# Patient Record
Sex: Male | Born: 1950 | Race: Black or African American | Hispanic: No | Marital: Married | Smoking: Never smoker
Health system: Southern US, Community
[De-identification: ages and names within clinical notes are randomized; demographics above are authoritative.]

## PROBLEM LIST (undated history)

## (undated) DIAGNOSIS — M419 Scoliosis, unspecified: Secondary | ICD-10-CM

## (undated) DIAGNOSIS — J449 Chronic obstructive pulmonary disease, unspecified: Secondary | ICD-10-CM

## (undated) DIAGNOSIS — I10 Essential (primary) hypertension: Secondary | ICD-10-CM

## (undated) DIAGNOSIS — J45909 Unspecified asthma, uncomplicated: Secondary | ICD-10-CM

## (undated) DIAGNOSIS — J302 Other seasonal allergic rhinitis: Secondary | ICD-10-CM

## (undated) DIAGNOSIS — I219 Acute myocardial infarction, unspecified: Secondary | ICD-10-CM

## (undated) DIAGNOSIS — E7800 Pure hypercholesterolemia, unspecified: Secondary | ICD-10-CM

## (undated) DIAGNOSIS — G4730 Sleep apnea, unspecified: Secondary | ICD-10-CM

## (undated) HISTORY — PX: CARPAL TUNNEL RELEASE: SHX101

## (undated) HISTORY — DX: Sleep apnea, unspecified: G47.30

## (undated) HISTORY — PX: OTHER SURGICAL HISTORY: SHX169

## (undated) HISTORY — DX: Other seasonal allergic rhinitis: J30.2

## (undated) HISTORY — DX: Unspecified asthma, uncomplicated: J45.909

## (undated) HISTORY — DX: Essential (primary) hypertension: I10

## (undated) HISTORY — DX: Scoliosis, unspecified: M41.9

## (undated) HISTORY — DX: Acute myocardial infarction, unspecified: I21.9

## (undated) HISTORY — DX: Pure hypercholesterolemia, unspecified: E78.00

---

## 2003-01-09 ENCOUNTER — Encounter: Payer: Self-pay | Admitting: Neurosurgery

## 2003-01-11 ENCOUNTER — Encounter: Payer: Self-pay | Admitting: Neurosurgery

## 2003-01-11 ENCOUNTER — Inpatient Hospital Stay (HOSPITAL_COMMUNITY): Admission: RE | Admit: 2003-01-11 | Discharge: 2003-01-12 | Payer: Self-pay | Admitting: Neurosurgery

## 2003-02-21 ENCOUNTER — Encounter: Admission: RE | Admit: 2003-02-21 | Discharge: 2003-02-21 | Payer: Self-pay | Admitting: Neurosurgery

## 2003-02-21 ENCOUNTER — Encounter: Payer: Self-pay | Admitting: Neurosurgery

## 2004-03-04 ENCOUNTER — Other Ambulatory Visit: Payer: Self-pay

## 2004-09-12 ENCOUNTER — Emergency Department: Payer: Self-pay | Admitting: Emergency Medicine

## 2004-10-14 ENCOUNTER — Emergency Department: Payer: Self-pay | Admitting: Emergency Medicine

## 2004-12-23 ENCOUNTER — Emergency Department: Payer: Self-pay | Admitting: Emergency Medicine

## 2005-04-17 ENCOUNTER — Emergency Department: Payer: Self-pay | Admitting: Emergency Medicine

## 2005-08-30 ENCOUNTER — Other Ambulatory Visit: Payer: Self-pay

## 2005-08-30 ENCOUNTER — Emergency Department: Payer: Self-pay | Admitting: Emergency Medicine

## 2005-10-19 ENCOUNTER — Ambulatory Visit: Payer: Self-pay | Admitting: Internal Medicine

## 2006-03-06 ENCOUNTER — Emergency Department: Payer: Self-pay | Admitting: Internal Medicine

## 2006-06-04 ENCOUNTER — Other Ambulatory Visit: Payer: Self-pay

## 2006-06-04 ENCOUNTER — Emergency Department: Payer: Self-pay | Admitting: Unknown Physician Specialty

## 2007-10-02 ENCOUNTER — Emergency Department: Payer: Self-pay | Admitting: Emergency Medicine

## 2008-05-06 ENCOUNTER — Emergency Department: Payer: Self-pay | Admitting: Emergency Medicine

## 2008-07-18 ENCOUNTER — Emergency Department: Payer: Self-pay

## 2010-08-13 ENCOUNTER — Emergency Department: Payer: Self-pay | Admitting: Emergency Medicine

## 2011-04-21 ENCOUNTER — Inpatient Hospital Stay: Payer: Self-pay | Admitting: Internal Medicine

## 2011-10-10 ENCOUNTER — Emergency Department: Payer: Self-pay | Admitting: *Deleted

## 2013-11-18 ENCOUNTER — Emergency Department: Payer: Self-pay | Admitting: Emergency Medicine

## 2013-11-18 LAB — TROPONIN I

## 2013-11-18 LAB — CBC WITH DIFFERENTIAL/PLATELET
BASOS ABS: 0 10*3/uL (ref 0.0–0.1)
Basophil %: 0.7 %
EOS ABS: 0.2 10*3/uL (ref 0.0–0.7)
Eosinophil %: 3.1 %
HCT: 51.1 % (ref 40.0–52.0)
HGB: 16.4 g/dL (ref 13.0–18.0)
LYMPHS ABS: 1.8 10*3/uL (ref 1.0–3.6)
Lymphocyte %: 29.7 %
MCH: 28.2 pg (ref 26.0–34.0)
MCHC: 32.2 g/dL (ref 32.0–36.0)
MCV: 88 fL (ref 80–100)
Monocyte #: 0.5 x10 3/mm (ref 0.2–1.0)
Monocyte %: 8.3 %
Neutrophil #: 3.6 10*3/uL (ref 1.4–6.5)
Neutrophil %: 58.2 %
Platelet: 171 10*3/uL (ref 150–440)
RBC: 5.84 10*6/uL (ref 4.40–5.90)
RDW: 16.3 % — ABNORMAL HIGH (ref 11.5–14.5)
WBC: 6.1 10*3/uL (ref 3.8–10.6)

## 2013-11-18 LAB — BASIC METABOLIC PANEL
ANION GAP: 2 — AB (ref 7–16)
BUN: 18 mg/dL (ref 7–18)
CALCIUM: 9.1 mg/dL (ref 8.5–10.1)
CO2: 33 mmol/L — AB (ref 21–32)
CREATININE: 1.08 mg/dL (ref 0.60–1.30)
Chloride: 101 mmol/L (ref 98–107)
EGFR (Non-African Amer.): >60
GLUCOSE: 98 mg/dL (ref 65–99)
OSMOLALITY: 274 (ref 275–301)
Potassium: 3.9 mmol/L (ref 3.5–5.1)
SODIUM: 136 mmol/L (ref 136–145)

## 2013-11-21 ENCOUNTER — Ambulatory Visit (INDEPENDENT_AMBULATORY_CARE_PROVIDER_SITE_OTHER): Payer: Medicare Other | Admitting: Pulmonary Disease

## 2013-11-21 ENCOUNTER — Encounter: Payer: Self-pay | Admitting: Pulmonary Disease

## 2013-11-21 ENCOUNTER — Encounter (INDEPENDENT_AMBULATORY_CARE_PROVIDER_SITE_OTHER): Payer: Self-pay

## 2013-11-21 VITALS — BP 148/86 | HR 66 | Temp 97.7°F | Ht 66.0 in | Wt 345.0 lb

## 2013-11-21 DIAGNOSIS — G4733 Obstructive sleep apnea (adult) (pediatric): Secondary | ICD-10-CM | POA: Insufficient documentation

## 2013-11-21 DIAGNOSIS — J449 Chronic obstructive pulmonary disease, unspecified: Secondary | ICD-10-CM

## 2013-11-21 DIAGNOSIS — I509 Heart failure, unspecified: Secondary | ICD-10-CM

## 2013-11-21 DIAGNOSIS — R0602 Shortness of breath: Secondary | ICD-10-CM | POA: Insufficient documentation

## 2013-11-21 DIAGNOSIS — I25810 Atherosclerosis of coronary artery bypass graft(s) without angina pectoris: Secondary | ICD-10-CM

## 2013-11-21 DIAGNOSIS — Z8709 Personal history of other diseases of the respiratory system: Secondary | ICD-10-CM | POA: Insufficient documentation

## 2013-11-21 DIAGNOSIS — Z9989 Dependence on other enabling machines and devices: Secondary | ICD-10-CM

## 2013-11-21 NOTE — Progress Notes (Signed)
Subjective:    Patient ID: [name], male    DOB: 03-02-1951, 63 y.o.   MRN: [id number]  HPI  [name] was referred by Dr. Dario Guardian for shortness of breath. He has been having shortness of breath with exertion.  He has to stop to catch his breath after just walking a few.  He doesn't have wheeze or cough.  This has been getting worse over the last two years. It has definitely gotten worse in the last year.  He has been told that he has fluid in his lungs. He has congestive heart failure. He follows with Dr. Juel Burrow who is a cardiologist.   He had his first heart attack in 1994 and he was airlifted to [facility]. He had a heart catheterization at [facility] but no PCI.  A similar heart attack happened in the 1990's.  Again, he thinks that he didn't have a stent for that one either.    He does not have chest pain but he does have leg swelling.  He does not adjust his lasix based on weight or swelling.  He knows that his dyspnea worsens on days when he has forgotten his lasix. He is not following a low sodium diet.    He has been inhaled medications for a few years. He takes Spiriva, Advair, combivent.  He had a lung function test at some point a few years ago and he was told that COPD and asthma.  He smoked cigarettes very infrequently for only a month or a month and a half when he was in his 46s.  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  He worked with [employer], mostly loading and unloading.    He doesn't note significant second hand smoke.  Childhood was normal without respiratory problems.  He has pollen allergies that causes sinus congestion.    He ended up in the ER on Sunday for shoulder pain and was told then that he had fluid on his lungs.  He has a bad hip and he knows that he needs a replacement.  He is nervous about having surgery so he has ben putting it off.  He also has a cervical stenosis.  For five years he has been very immobile, just using a cain and  a walker.  He cannot exercise due to these problems.    Past Medical History  Diagnosis Date  . High blood pressure   . Heart attack   . Asthma   . High cholesterol   . Seasonal allergies   . Sleep apnea   . Scoliosis      Family History  Problem Relation Age of Onset  . Heart disease Father      History   Social History  . Marital Status: Married    Spouse Name: N/A    Number of Children: N/A  . Years of Education: N/A   Occupational History  . Not on file.   Social History Main Topics  . Smoking status: Never Smoker   . Smokeless tobacco: Never Used  . Alcohol Use: No  . Drug Use: No  . Sexual Activity: Not on file   Other Topics Concern  . Not on file   Social History Narrative  . No narrative on file     Allergies  Allergen Reactions  . Metformin And Related     "shakes" per pt  . Morphine And Related     Hallucinations, sweats  No outpatient prescriptions prior to visit.   No facility-administered medications prior to visit.      Review of Systems  Constitutional: Negative for fever and unexpected weight change.  HENT: Positive for sinus pressure. Negative for congestion, dental problem, ear pain, nosebleeds, postnasal drip, rhinorrhea, sneezing, sore throat and trouble swallowing.   Eyes: Negative for redness and itching.  Respiratory: Positive for shortness of breath. Negative for cough, chest tightness and wheezing.   Cardiovascular: Positive for leg swelling. Negative for palpitations.  Gastrointestinal: Positive for vomiting. Negative for nausea.  Genitourinary: Negative for dysuria.  Musculoskeletal: Negative for joint swelling.  Skin: Negative for rash.  Neurological: Negative for headaches.  Hematological: Does not bruise/bleed easily.  Psychiatric/Behavioral: Negative for dysphoric mood. The patient is not nervous/anxious.        Objective:   Physical Exam  Filed Vitals:   11/21/13 1029  BP: 148/86  Pulse: 66  Temp:  97.7 F (36.5 C)  TempSrc: Oral  Height: 5\' 6"  (1.676 m)  Weight: 345 lb (156.491 kg)  SpO2: 92%  RA  Gen: morbidly obese, chronically ill appearing, in wheelchair HEENT: NCAT, PERRL, EOMi, OP clear, neck supple without masses PULM: crackles 1/2 way up bilaterally CV: RRR, systolic murmur LUSB, no JVD AB: BS+, soft, nontender, no hsm Ext: warm, massive, chronic pitting edema in both legs, no clubbing, no cyanosis Derm: no rash or skin breakdown Neuro: A&Ox4, CN II-XII intact, strength 5/5 in all 4 extremities  2012 CT chest reviewed> pulm edema, ? Mild tracheomalacia bronchus intermedius, no clear parenchymal problems, no PE      Assessment & Plan:   Shortness of breath [name] is clearly volume overloaded on exam.  I explained to him today that there are many causes of his dyspnea, and the most significant are heart failure and morbid obesity with deconditioning.  I reviewed the images from a 2012 CT chest with him and his son today in clinic.  He clearly has restrictive lung disease due to a large heart and morbid obesity.  He had pulmonary edema on that study as well.  I question mild tracheomalacia, but this wasn't perfectly clear.  So in summary, I doubt there is significant lung pathology going on here.  He may have mild asthma, but because his history is not consistent with wheezing, chest tightness, or cough, I don't think that is playing a big role here.  Plan: -full PFT -obtain records from PCP and cardiologist > clinic notes, lab work, echo -I have asked him to discuss weight monitoring, more aggressive diuretics, and sodium intake monitoring with his Cardiologist -once I can see the records of his echo and labs I can recommend some changes to his diuretic regimen if he can't get into cardiology first -for now continue all inhaled therapies, but I suspect we will be able to discontinue some or all of these after PFTs -continue CPAP, see below -bring walker or cane for  next visit so we can check ambulatory O2 saturation  Plan:  OSA on CPAP It has been several years since he has had a CPAP titration study and his weight has gone up significantly in the interim.  I worry about the possibility of central sleep apnea as well given his CHF  Plan: -split night study  CHF (congestive heart failure) He is clearly volume overloaded and doesn't follow a low sodium diet. We need records to help adjust meds and he needs to discuss a more aggressive diuretic/fluid management strategy with  his cardiologist.  Plan: -obtain cardiology records and echo -low sodium diet encouraged  CAD (coronary artery disease) of artery bypass graft Again, we need records here    Updated Medication List Outpatient Encounter Prescriptions as of 11/21/2013  Medication Sig  . aspirin 81 MG tablet Take 81 mg by mouth daily.  . celecoxib (CELEBREX) 200 MG capsule Take 200 mg by mouth daily.  . Fluticasone-Salmeterol (ADVAIR) 250-50 MCG/DOSE AEPB Inhale 1 puff into the lungs 2 (two) times daily.  . furosemide (LASIX) 40 MG tablet Take 40 mg by mouth daily.  Marland Kitchen glipiZIDE (GLUCOTROL) 5 MG tablet Take 5 mg by mouth daily before breakfast.  . Ipratropium-Albuterol (COMBIVENT RESPIMAT) 20-100 MCG/ACT AERS respimat Inhale 1 puff into the lungs every 6 (six) hours.  . nebivolol (BYSTOLIC) 10 MG tablet Take 10 mg by mouth daily.  . rosuvastatin (CRESTOR) 10 MG tablet Take 10 mg by mouth daily.  . tamsulosin (FLOMAX) 0.4 MG CAPS capsule Take 0.4 mg by mouth daily.  Marland Kitchen telmisartan (MICARDIS) 80 MG tablet Take 80 mg by mouth daily.  Marland Kitchen tiotropium (SPIRIVA) 18 MCG inhalation capsule Place 18 mcg into inhaler and inhale daily.

## 2013-11-21 NOTE — Assessment & Plan Note (Signed)
It has been several years since he has had a CPAP titration study and his weight has gone up significantly in the interim.  I worry about the possibility of central sleep apnea as well given his CHF  Plan: -split night study

## 2013-11-21 NOTE — Assessment & Plan Note (Signed)
Again, we need records here

## 2013-11-21 NOTE — Patient Instructions (Signed)
We will set up lung function testing at [facility] We will request records from Dr. Aurelio BrashJadali's office and Dr. Renie OraMassoud's office Next time bring your walker or cane so we can check your oxygen when you walk We will see you back in 2-4 weeks or sooner if needed

## 2013-11-21 NOTE — Assessment & Plan Note (Addendum)
[name] is clearly volume overloaded on exam.  I explained to him today that there are many causes of his dyspnea, and the most significant are heart failure and morbid obesity with deconditioning.  I reviewed the images from a 2012 CT chest with him and his son today in clinic.  He clearly has restrictive lung disease due to a large heart and morbid obesity.  He had pulmonary edema on that study as well.  I question mild tracheomalacia, but this wasn't perfectly clear.  So in summary, I doubt there is significant lung pathology going on here.  He may have mild asthma, but because his history is not consistent with wheezing, chest tightness, or cough, I don't think that is playing a big role here.  Plan: -full PFT -obtain records from PCP and cardiologist > clinic notes, lab work, echo -I have asked him to discuss weight monitoring, more aggressive diuretics, and sodium intake monitoring with his Cardiologist -once I can see the records of his echo and labs I can recommend some changes to his diuretic regimen if he can't get into cardiology first -for now continue all inhaled therapies, but I suspect we will be able to discontinue some or all of these after PFTs -continue CPAP, see below -bring walker or cane for next visit so we can check ambulatory O2 saturation  Plan:

## 2013-11-21 NOTE — Assessment & Plan Note (Signed)
He is clearly volume overloaded and doesn't follow a low sodium diet. We need records to help adjust meds and he needs to discuss a more aggressive diuretic/fluid management strategy with his cardiologist.  Plan: -obtain cardiology records and echo -low sodium diet encouraged

## 2013-12-06 ENCOUNTER — Ambulatory Visit: Payer: Self-pay | Admitting: Pulmonary Disease

## 2013-12-06 LAB — PULMONARY FUNCTION TEST

## 2013-12-12 ENCOUNTER — Ambulatory Visit (INDEPENDENT_AMBULATORY_CARE_PROVIDER_SITE_OTHER): Payer: Medicare Other | Admitting: Pulmonary Disease

## 2013-12-12 ENCOUNTER — Encounter: Payer: Self-pay | Admitting: Pulmonary Disease

## 2013-12-12 VITALS — BP 148/92 | HR 87 | Ht 67.0 in | Wt 355.0 lb

## 2013-12-12 DIAGNOSIS — G4733 Obstructive sleep apnea (adult) (pediatric): Secondary | ICD-10-CM

## 2013-12-12 DIAGNOSIS — R0602 Shortness of breath: Secondary | ICD-10-CM

## 2013-12-12 DIAGNOSIS — J9611 Chronic respiratory failure with hypoxia: Secondary | ICD-10-CM

## 2013-12-12 DIAGNOSIS — Z9989 Dependence on other enabling machines and devices: Secondary | ICD-10-CM

## 2013-12-12 DIAGNOSIS — J9610 Chronic respiratory failure, unspecified whether with hypoxia or hypercapnia: Secondary | ICD-10-CM

## 2013-12-12 DIAGNOSIS — R0902 Hypoxemia: Secondary | ICD-10-CM

## 2013-12-12 NOTE — Assessment & Plan Note (Signed)
Is been several years since he has had a CPAP titration study. 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  Plan: -CPAP titration study

## 2013-12-12 NOTE — Assessment & Plan Note (Signed)
He has chronic hypoxemic respiratory failure do to his severe obesity limiting his ventilation as well as his CHF.  Plan: -2 L of oxygen with exertion and each bedtime

## 2013-12-12 NOTE — Patient Instructions (Signed)
Use 2 L of Oxygen with exertion and with sleep Stop Spiriva, Stop Adviar Use the combivent as needed for shortness of breath Follow up with your cardiologist We will see you back in 6 months or sooner if needed

## 2013-12-12 NOTE — Progress Notes (Signed)
Subjective:    Patient ID: [name], male    DOB: [birth year], 63 y.o.   MRN: [id number]  Synopsis:: This is a morbidly obese 63 year old who came to the [facility] pulmonary clinic in January of 2014 for evaluation of shortness of breath. A prior history of asthma. He also has CHF as well as obstructive sleep apnea. Full pulmonary function testing showed severe restriction consistent with obesity but no airflow obstruction. A CT scan of his chest showed no evidence of parenchymal lung disease.  HPI  12/12/2013 ROV > He continues to have shortness of breath on exertion.  He continues to use his CPAP machine at night as well as at times during the day while rest.  He lost the long tubing for his CPAP machine, but apparently he is still able to use it.  He really struggles to breathe get around the house. He does not have much problem with cough or wheezing. He does not get benefit from the Advair and Spiriva which he has been using continuously.  Past Medical History  Diagnosis Date  . High blood pressure   . Heart attack   . Asthma   . High cholesterol   . Seasonal allergies   . Sleep apnea   . Scoliosis      Review of Systems  Constitutional: Positive for fatigue. Negative for fever and chills.  HENT: Negative for postnasal drip, rhinorrhea and sinus pressure.   Respiratory: Positive for shortness of breath. Negative for cough and wheezing.   Cardiovascular: Positive for leg swelling. Negative for chest pain and palpitations.       Objective:   Physical Exam  Filed Vitals:   12/12/13 1037  BP: 148/92  Pulse: 87  Height: 5\' 7"  (1.702 m)  Weight: 355 lb (161.027 kg)  SpO2: 90%  RA  Ambulated on RA and dropped to 86% in a few feet, improved with 2LNC  Gen: morbidly obese, no acute distress HEENT: NCAT, PERRL, EOMi, OP clear, neck supple without masses PULM: CTA B CV: RRR, no mgr, no JVD AB: BS+, soft, nontender, no hsm Ext: warm, massive edema, no  clubbing, no cyanosis        Assessment & Plan:   Shortness of breath Pulmonary function testing showed no airflow obstruction but severe restriction from obesity.  I explained to him today that he is short of breath primarily because of his severe, morbid obesity, deconditioning, and heart failure.  He may have mild intermittent asthma but it was not severe enough to show up on pulmonary function testing.  Plan: -Stop Spiriva -Stop Advair -Continue when necessary Combivent for mild intermittent asthma Followup with cardiologist regarding diuretic regimen -CPAP each bedtime, see conversation below  OSA on CPAP Is been several years since he has had a CPAP titration study. 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  Plan: -CPAP titration study  Chronic hypoxemic respiratory failure He has chronic hypoxemic respiratory failure do to his severe obesity limiting his ventilation as well as his CHF.  Plan: -2 L of oxygen with exertion and each bedtime    Updated Medication List Outpatient Encounter Prescriptions as of 12/12/2013  Medication Sig  . aspirin 81 MG tablet Take 81 mg by mouth daily.  . celecoxib (CELEBREX) 200 MG capsule Take 200 mg by mouth daily.  . clonazePAM (KLONOPIN) 1 MG tablet Take 1 mg by mouth 3 (three) times daily as needed  for anxiety.  . Fluticasone-Salmeterol (ADVAIR) 250-50 MCG/DOSE AEPB Inhale 1 puff into the lungs 2 (two) times daily.  . furosemide (LASIX) 40 MG tablet Take 40 mg by mouth daily.  Marland Kitchen. glipiZIDE (GLUCOTROL) 5 MG tablet Take 5 mg by mouth daily before breakfast.  . Ipratropium-Albuterol (COMBIVENT RESPIMAT) 20-100 MCG/ACT AERS respimat Inhale 1 puff into the lungs every 6 (six) hours.  . nebivolol (BYSTOLIC) 10 MG tablet Take 10 mg by mouth daily.  . rosuvastatin (CRESTOR) 10 MG tablet Take 10 mg by mouth daily.  . tamsulosin (FLOMAX) 0.4 MG CAPS capsule Take 0.4 mg by mouth daily.  Marland Kitchen.  telmisartan (MICARDIS) 80 MG tablet Take 80 mg by mouth daily.  Marland Kitchen. tiotropium (SPIRIVA) 18 MCG inhalation capsule Place 18 mcg into inhaler and inhale daily.

## 2013-12-12 NOTE — Assessment & Plan Note (Signed)
Pulmonary function testing showed no airflow obstruction but severe restriction from obesity.  I explained to him today that he is short of breath primarily because of his severe, morbid obesity, deconditioning, and heart failure.  He may have mild intermittent asthma but it was not severe enough to show up on pulmonary function testing.  Plan: -Stop Spiriva -Stop Advair -Continue when necessary Combivent for mild intermittent asthma Followup with cardiologist regarding diuretic regimen -CPAP each bedtime, see conversation below

## 2013-12-16 ENCOUNTER — Encounter (HOSPITAL_BASED_OUTPATIENT_CLINIC_OR_DEPARTMENT_OTHER): Payer: Medicare Other

## 2013-12-27 ENCOUNTER — Encounter: Payer: Self-pay | Admitting: Pulmonary Disease

## 2014-02-05 ENCOUNTER — Encounter (INDEPENDENT_AMBULATORY_CARE_PROVIDER_SITE_OTHER): Payer: Self-pay

## 2014-02-05 ENCOUNTER — Encounter: Payer: Self-pay | Admitting: Pulmonary Disease

## 2014-02-05 ENCOUNTER — Ambulatory Visit (INDEPENDENT_AMBULATORY_CARE_PROVIDER_SITE_OTHER): Payer: Medicare Other | Admitting: Pulmonary Disease

## 2014-02-05 VITALS — BP 162/96 | HR 64 | Ht 67.0 in | Wt 375.0 lb

## 2014-02-05 DIAGNOSIS — R0902 Hypoxemia: Secondary | ICD-10-CM

## 2014-02-05 DIAGNOSIS — J9611 Chronic respiratory failure with hypoxia: Secondary | ICD-10-CM

## 2014-02-05 DIAGNOSIS — I509 Heart failure, unspecified: Secondary | ICD-10-CM

## 2014-02-05 DIAGNOSIS — J9610 Chronic respiratory failure, unspecified whether with hypoxia or hypercapnia: Secondary | ICD-10-CM

## 2014-02-05 NOTE — Assessment & Plan Note (Signed)
I am really concerned about the worsening edema and dyspnea  He has an appointment with cardiology soon.  In the meantime he will be taking an extra dose of lasix.    Plan -continue extra dose of lasix until he sees cardiology

## 2014-02-05 NOTE — Assessment & Plan Note (Signed)
Today we checked his O2 saturation at rest and it was actually a little worse than last time.  As noted previously, I don't see evidence of lung disease but he clearly has CHF and pulmonary edema.   Today we adjusted his prescription for O2 to 2L continuously.

## 2014-02-05 NOTE — Patient Instructions (Signed)
We will call [facility] and have them change the prescription to 2L continuously Use your oxygen 24 hours a day Follow up with your cardiologist We will see you back here as needed

## 2014-02-05 NOTE — Progress Notes (Signed)
Subjective:    Patient ID: [name] R Frisbie, male    DOB: 02/06/1951, 63 y.o.   MRN: [id number]  Synopsis:: This is a morbidly obese 63 year old who came to the [facility] pulmonary clinic in January of 2014 for evaluation of shortness of breath. A prior history of asthma. He also has CHF as well as obstructive sleep apnea. Full pulmonary function testing showed severe restriction consistent with obesity but no airflow obstruction. A CT scan of his chest showed no evidence of parenchymal lung disease.  HPI   12/12/2013 ROV > He continues to have shortness of breath on exertion.  He continues to use his CPAP machine at night as well as at times during the day while rest.  He lost the long tubing for his CPAP machine, but apparently he is still able to use it.  He really struggles to breathe get around the house. He does not have much problem with cough or wheezing. He does not get benefit from the Advair and Spiriva which he has been using continuously.  02/05/2014 ROV >> [name] had a hard time breathing today when his oxygen tank ran out. [facility] only has him on 1L continuously.  He has been using the oxygen at home only with exertion.  Before that he was doing well.  He has been swelling more lately so he started taking an extra dose of lasix a couple of days ago.  He has not taken the inhalers since the last visit and he actually felt a little better for a while.  Past Medical History  Diagnosis Date  . High blood pressure   . Heart attack   . Asthma   . High cholesterol   . Seasonal allergies   . Sleep apnea   . Scoliosis      Review of Systems  Constitutional: Positive for fatigue. Negative for fever and chills.  HENT: Negative for postnasal drip, rhinorrhea and sinus pressure.   Respiratory: Positive for shortness of breath. Negative for cough and wheezing.   Cardiovascular: Positive for leg swelling. Negative for chest pain and palpitations.       Objective:   Physical  Exam   Filed Vitals:   02/05/14 1612  BP: 162/96  Pulse: 64  Height: 5\' 7"  (1.702 m)  Weight: 170.099 kg (375 lb)  SpO2: 94%  2 L [place]  Room air O2 saturation at rest 86%  Gen: morbidly obese, no acute distress HEENT: NCAT, PERRL, EOMi, OP clear, neck supple without masses PULM: few crackles in bases CV: RRR, no mgr, no JVD AB: BS+, soft, nontender, no hsm Ext: warm, massive edema, no clubbing, no cyanosis        Assessment & Plan:   Chronic hypoxemic respiratory failure Today we checked his O2 saturation at rest and it was actually a little worse than last time.  As noted previously, I don't see evidence of lung disease but he clearly has CHF and pulmonary edema.   Today we adjusted his prescription for O2 to 2L continuously.  CHF (congestive heart failure) I am really concerned about the worsening edema and dyspnea  He has an appointment with cardiology soon.  In the meantime he will be taking an extra dose of lasix.    Plan -continue extra dose of lasix until he sees cardiology    Updated Medication List Outpatient Encounter Prescriptions as of 02/05/2014  Medication Sig  . aspirin 81 MG tablet Take 81 mg by mouth daily.  . celecoxib (CELEBREX)  200 MG capsule Take 200 mg by mouth daily.  . clonazePAM (KLONOPIN) 1 MG tablet Take 1 mg by mouth 3 (three) times daily as needed for anxiety.  . furosemide (LASIX) 40 MG tablet Take 40 mg by mouth daily.  Marland Kitchen glipiZIDE (GLUCOTROL) 5 MG tablet Take 5 mg by mouth daily before breakfast.  . Ipratropium-Albuterol (COMBIVENT RESPIMAT) 20-100 MCG/ACT AERS respimat Inhale 1 puff into the lungs every 6 (six) hours.  . nebivolol (BYSTOLIC) 10 MG tablet Take 10 mg by mouth daily.  . rosuvastatin (CRESTOR) 10 MG tablet Take 10 mg by mouth daily.  . tamsulosin (FLOMAX) 0.4 MG CAPS capsule Take 0.4 mg by mouth daily.  Marland Kitchen telmisartan (MICARDIS) 80 MG tablet Take 80 mg by mouth daily.  . [DISCONTINUED] Fluticasone-Salmeterol (ADVAIR)  250-50 MCG/DOSE AEPB Inhale 1 puff into the lungs 2 (two) times daily.  . [DISCONTINUED] tiotropium (SPIRIVA) 18 MCG inhalation capsule Place 18 mcg into inhaler and inhale daily.

## 2014-11-11 DIAGNOSIS — I509 Heart failure, unspecified: Secondary | ICD-10-CM | POA: Diagnosis not present

## 2014-12-12 DIAGNOSIS — I509 Heart failure, unspecified: Secondary | ICD-10-CM | POA: Diagnosis not present

## 2014-12-26 DIAGNOSIS — R05 Cough: Secondary | ICD-10-CM | POA: Diagnosis not present

## 2014-12-26 DIAGNOSIS — I10 Essential (primary) hypertension: Secondary | ICD-10-CM | POA: Diagnosis not present

## 2014-12-26 DIAGNOSIS — J449 Chronic obstructive pulmonary disease, unspecified: Secondary | ICD-10-CM | POA: Diagnosis not present

## 2014-12-26 DIAGNOSIS — E1165 Type 2 diabetes mellitus with hyperglycemia: Secondary | ICD-10-CM | POA: Diagnosis not present

## 2014-12-30 ENCOUNTER — Encounter: Payer: Self-pay | Admitting: Surgery

## 2014-12-30 DIAGNOSIS — I509 Heart failure, unspecified: Secondary | ICD-10-CM | POA: Diagnosis not present

## 2014-12-30 DIAGNOSIS — J449 Chronic obstructive pulmonary disease, unspecified: Secondary | ICD-10-CM | POA: Diagnosis not present

## 2014-12-30 DIAGNOSIS — E785 Hyperlipidemia, unspecified: Secondary | ICD-10-CM | POA: Diagnosis not present

## 2014-12-30 DIAGNOSIS — I10 Essential (primary) hypertension: Secondary | ICD-10-CM | POA: Diagnosis not present

## 2014-12-30 DIAGNOSIS — L97221 Non-pressure chronic ulcer of left calf limited to breakdown of skin: Secondary | ICD-10-CM | POA: Diagnosis not present

## 2014-12-30 DIAGNOSIS — E119 Type 2 diabetes mellitus without complications: Secondary | ICD-10-CM | POA: Diagnosis not present

## 2014-12-30 DIAGNOSIS — I87332 Chronic venous hypertension (idiopathic) with ulcer and inflammation of left lower extremity: Secondary | ICD-10-CM | POA: Diagnosis not present

## 2014-12-31 DIAGNOSIS — J449 Chronic obstructive pulmonary disease, unspecified: Secondary | ICD-10-CM | POA: Diagnosis not present

## 2014-12-31 DIAGNOSIS — I158 Other secondary hypertension: Secondary | ICD-10-CM | POA: Diagnosis not present

## 2014-12-31 DIAGNOSIS — E1165 Type 2 diabetes mellitus with hyperglycemia: Secondary | ICD-10-CM | POA: Diagnosis not present

## 2015-01-06 DIAGNOSIS — L97221 Non-pressure chronic ulcer of left calf limited to breakdown of skin: Secondary | ICD-10-CM | POA: Diagnosis not present

## 2015-01-06 DIAGNOSIS — E785 Hyperlipidemia, unspecified: Secondary | ICD-10-CM | POA: Diagnosis not present

## 2015-01-06 DIAGNOSIS — I10 Essential (primary) hypertension: Secondary | ICD-10-CM | POA: Diagnosis not present

## 2015-01-06 DIAGNOSIS — I509 Heart failure, unspecified: Secondary | ICD-10-CM | POA: Diagnosis not present

## 2015-01-06 DIAGNOSIS — E119 Type 2 diabetes mellitus without complications: Secondary | ICD-10-CM | POA: Diagnosis not present

## 2015-01-06 DIAGNOSIS — I87332 Chronic venous hypertension (idiopathic) with ulcer and inflammation of left lower extremity: Secondary | ICD-10-CM | POA: Diagnosis not present

## 2015-01-06 DIAGNOSIS — J449 Chronic obstructive pulmonary disease, unspecified: Secondary | ICD-10-CM | POA: Diagnosis not present

## 2015-01-10 DIAGNOSIS — I509 Heart failure, unspecified: Secondary | ICD-10-CM | POA: Diagnosis not present

## 2015-02-07 ENCOUNTER — Encounter: Payer: Self-pay | Admitting: Surgery

## 2015-02-10 DIAGNOSIS — I509 Heart failure, unspecified: Secondary | ICD-10-CM | POA: Diagnosis not present

## 2015-03-12 DIAGNOSIS — I509 Heart failure, unspecified: Secondary | ICD-10-CM | POA: Diagnosis not present

## 2015-04-12 DIAGNOSIS — I509 Heart failure, unspecified: Secondary | ICD-10-CM | POA: Diagnosis not present

## 2015-05-12 DIAGNOSIS — I509 Heart failure, unspecified: Secondary | ICD-10-CM | POA: Diagnosis not present

## 2015-06-12 DIAGNOSIS — I509 Heart failure, unspecified: Secondary | ICD-10-CM | POA: Diagnosis not present

## 2015-06-26 DIAGNOSIS — N400 Enlarged prostate without lower urinary tract symptoms: Secondary | ICD-10-CM | POA: Diagnosis not present

## 2015-06-26 DIAGNOSIS — I10 Essential (primary) hypertension: Secondary | ICD-10-CM | POA: Diagnosis not present

## 2015-06-26 DIAGNOSIS — E119 Type 2 diabetes mellitus without complications: Secondary | ICD-10-CM | POA: Diagnosis not present

## 2015-06-26 DIAGNOSIS — E781 Pure hyperglyceridemia: Secondary | ICD-10-CM | POA: Diagnosis not present

## 2015-07-01 ENCOUNTER — Encounter: Payer: Medicare Other | Attending: Surgery | Admitting: Surgery

## 2015-07-01 DIAGNOSIS — I43 Cardiomyopathy in diseases classified elsewhere: Secondary | ICD-10-CM | POA: Diagnosis not present

## 2015-07-01 DIAGNOSIS — I87332 Chronic venous hypertension (idiopathic) with ulcer and inflammation of left lower extremity: Secondary | ICD-10-CM | POA: Diagnosis not present

## 2015-07-01 DIAGNOSIS — L97921 Non-pressure chronic ulcer of unspecified part of left lower leg limited to breakdown of skin: Secondary | ICD-10-CM | POA: Diagnosis not present

## 2015-07-01 DIAGNOSIS — J449 Chronic obstructive pulmonary disease, unspecified: Secondary | ICD-10-CM | POA: Diagnosis not present

## 2015-07-01 DIAGNOSIS — L97221 Non-pressure chronic ulcer of left calf limited to breakdown of skin: Secondary | ICD-10-CM | POA: Diagnosis not present

## 2015-07-01 DIAGNOSIS — E0859 Diabetes mellitus due to underlying condition with other circulatory complications: Secondary | ICD-10-CM | POA: Diagnosis not present

## 2015-07-01 DIAGNOSIS — I5022 Chronic systolic (congestive) heart failure: Secondary | ICD-10-CM | POA: Diagnosis not present

## 2015-07-01 DIAGNOSIS — I252 Old myocardial infarction: Secondary | ICD-10-CM | POA: Diagnosis not present

## 2015-07-01 DIAGNOSIS — I890 Lymphedema, not elsewhere classified: Secondary | ICD-10-CM | POA: Diagnosis not present

## 2015-07-01 DIAGNOSIS — F419 Anxiety disorder, unspecified: Secondary | ICD-10-CM | POA: Insufficient documentation

## 2015-07-01 DIAGNOSIS — Z87891 Personal history of nicotine dependence: Secondary | ICD-10-CM | POA: Insufficient documentation

## 2015-07-01 DIAGNOSIS — E784 Other hyperlipidemia: Secondary | ICD-10-CM | POA: Diagnosis not present

## 2015-07-01 DIAGNOSIS — I10 Essential (primary) hypertension: Secondary | ICD-10-CM | POA: Insufficient documentation

## 2015-07-01 DIAGNOSIS — E11622 Type 2 diabetes mellitus with other skin ulcer: Secondary | ICD-10-CM | POA: Diagnosis not present

## 2015-07-01 DIAGNOSIS — I158 Other secondary hypertension: Secondary | ICD-10-CM | POA: Diagnosis not present

## 2015-07-01 DIAGNOSIS — I509 Heart failure, unspecified: Secondary | ICD-10-CM | POA: Diagnosis not present

## 2015-07-01 DIAGNOSIS — N400 Enlarged prostate without lower urinary tract symptoms: Secondary | ICD-10-CM | POA: Diagnosis not present

## 2015-07-01 DIAGNOSIS — E119 Type 2 diabetes mellitus without complications: Secondary | ICD-10-CM | POA: Diagnosis not present

## 2015-07-01 DIAGNOSIS — E1159 Type 2 diabetes mellitus with other circulatory complications: Secondary | ICD-10-CM | POA: Diagnosis not present

## 2015-07-01 NOTE — Progress Notes (Signed)
OBED, SAMEK ([id number]) Visit Report for 07/01/2015 Abuse/Suicide Risk Screen Details Patient Name: [name], [name]. Date of Service: 07/01/2015 2:00 PM Medical Record Number: [id number] Patient Account Number: [id number] Date of Birth/Sex: Mar 05, 1951 (64 y.o. Male) Treating RN: Clover Mealy, RN, BSN, [place] Sink Primary Care Physician: Sherrie Mustache Other Clinician: Referring Physician: Sherrie Mustache Treating Physician/Extender: Rudene Re in Treatment: 0 Abuse/Suicide Risk Screen Items Answer ABUSE/SUICIDE RISK SCREEN: Has anyone close to you tried to hurt or harm you recentlyo No Do you feel uncomfortable with anyone in your familyo No Has anyone forced you do things that you didnot want to doo No Do you have any thoughts of harming yourselfo No Patient displays signs or symptoms of abuse and/or neglect. No Electronic Signature(s) Signed: 07/01/2015 2:25:36 PM By: Elpidio Eric BSN, RN Entered By: Elpidio Eric on 07/01/2015 14:25:35 Baratta, [name] ([id number]) -------------------------------------------------------------------------------- Activities of Daily Living Details Patient Name: [name], [name]. Date of Service: 07/01/2015 2:00 PM Medical Record Number: [id number] Patient Account Number: [id number] Date of Birth/Sex: [birth year] (64 y.o. Male) Treating RN: Clover Mealy, RN, BSN, [place] Sink Primary Care Physician: Sherrie Mustache Other Clinician: Referring Physician: Sherrie Mustache Treating Physician/Extender: Rudene Re in Treatment: 0 Activities of Daily Living Items Answer Activities of Daily Living (Please select one for each item) Drive Automobile Completely Able Take Medications Completely Able Use Telephone Completely Able Care for Appearance Completely Able Use Toilet Completely Able Bath / Shower Completely Able Dress Self Completely Able Feed Self Completely Able Walk Completely Able Get In / Out Bed Completely Able Housework Completely Able Prepare Meals  Completely Able Handle Money Completely Able Shop for Self Completely Able Electronic Signature(s) Signed: 07/01/2015 2:25:22 PM By: Elpidio Eric BSN, RN Entered By: Elpidio Eric on 07/01/2015 14:25:22 Mcquilkin, [name] ([id number]) -------------------------------------------------------------------------------- Education Assessment Details Patient Name: [name]. Date of Service: 07/01/2015 2:00 PM Medical Record Number: [id number] Patient Account Number: [id number] Date of Birth/Sex: [birth year] (64 y.o. Male) Treating RN: Clover Mealy, RN, BSN, [place] Sink Primary Care Physician: Sherrie Mustache Other Clinician: Referring Physician: Sherrie Mustache Treating Physician/Extender: Rudene Re in Treatment: 0 Primary Learner Assessed: Patient Learning Preferences/Education Level/Primary Language Learning Preference: Explanation Highest Education Level: High School Preferred Language: English Cognitive Barrier Assessment/Beliefs Language Barrier: No Physical Barrier Assessment Impaired Vision: Yes Glasses Impaired Hearing: No Decreased Hand dexterity: No Knowledge/Comprehension Assessment Knowledge Level: Medium Comprehension Level: Medium Ability to understand written Medium instructions: Ability to understand verbal Medium instructions: Motivation Assessment Anxiety Level: Calm Cooperation: Cooperative Education Importance: Acknowledges Need Interest in Health Problems: Asks Questions Perception: Coherent Willingness to Engage in Self- Medium Management Activities: Readiness to Engage in Self- Medium Management Activities: Electronic Signature(s) Signed: 07/01/2015 2:24:50 PM By: Elpidio Eric BSN, RN Entered By: Elpidio Eric on 07/01/2015 14:24:50 Heiden, [name] ([id number]) -------------------------------------------------------------------------------- Fall Risk Assessment Details Patient Name: [name]. Date of Service: 07/01/2015 2:00 PM Medical Record Number:  [id number] Patient Account Number: [id number] Date of Birth/Sex: [birth year] (64 y.o. Male) Treating RN: Clover Mealy, RN, BSN, [place] Sink Primary Care Physician: Sherrie Mustache Other Clinician: Referring Physician: Sherrie Mustache Treating Physician/Extender: Rudene Re in Treatment: 0 Fall Risk Assessment Items FALL RISK ASSESSMENT: History of falling - immediate or within 3 months 0 No Secondary diagnosis 0 No Ambulatory aid None/bed rest/wheelchair/nurse 0 No Crutches/cane/walker 15 Yes Furniture 0 No IV Access/Saline Lock 0 No Gait/Training Normal/bed rest/immobile 0 Yes Weak 10 Yes Impaired 20 Yes Mental Status Oriented to own ability 0 No Electronic Signature(s) Signed: 07/01/2015 2:24:12 PM By: Elpidio Eric BSN,  RN Entered By: Elpidio Eric on 07/01/2015 14:24:11 [name], [name] ([id number]) -------------------------------------------------------------------------------- Foot Assessment Details Patient Name: [name], [name]. Date of Service: 07/01/2015 2:00 PM Medical Record Number: [id number] Patient Account Number: [id number] Date of Birth/Sex: 10-10-1951 (64 y.o. Male) Treating RN: Clover Mealy, RN, BSN, [place] Sink Primary Care Physician: Sherrie Mustache Other Clinician: Referring Physician: Sherrie Mustache Treating Physician/Extender: Rudene Re in Treatment: 0 Foot Assessment Items Site Locations + = Sensation present, - = Sensation absent, C = Callus, U = Ulcer R = Redness, W = Warmth, M = Maceration, PU = Pre-ulcerative lesion F = Fissure, S = Swelling, D = Dryness Assessment Right: Left: Other Deformity: No No Prior Foot Ulcer: No No Prior Amputation: No No Charcot Joint: No No Ambulatory Status: Ambulatory With Help Assistance Device: Walker Gait: Surveyor, mining) Signed: 07/01/2015 2:23:26 PM By: Elpidio Eric BSN, RN Entered By: Elpidio Eric on 07/01/2015 14:23:26 Miguez, [name]  ([id number]) -------------------------------------------------------------------------------- Nutrition Risk Assessment Details Patient Name: [name], Ernest R. Date of Service: 07/01/2015 2:00 PM Medical Record Number: [id number] Patient Account Number: [id number] Date of Birth/Sex: [birth year] (64 y.o. Male) Treating RN: Clover Mealy, RN, BSN,  Sink Primary Care Physician: Sherrie Mustache Other Clinician: Referring Physician: Sherrie Mustache Treating Physician/Extender: Rudene Re in Treatment: 0 Height (in): Weight (lbs): Body Mass Index (BMI): Nutrition Risk Assessment Items NUTRITION RISK SCREEN: I have an illness or condition that made me change the kind and/or 0 No amount of food I eat I eat fewer than two meals per day 0 No I eat few fruits and vegetables, or milk products 0 No I have three or more drinks of beer, liquor or wine almost every day 0 No I have tooth or mouth problems that make it hard for me to eat 0 No I don't always have enough money to buy the food I need 0 No I eat alone most of the time 0 No I take three or more different prescribed or over-the-counter drugs a 0 No day Without wanting to, I have lost or gained 10 pounds in the last six 2 Yes months I am not always physically able to shop, cook and/or feed myself 2 Yes Nutrition Protocols Good Risk Protocol Provide education on Moderate Risk Protocol 0 nutrition Electronic Signature(s) Signed: 07/01/2015 2:23:53 PM By: Elpidio Eric BSN, RN Entered By: Elpidio Eric on 07/01/2015 14:23:52

## 2015-07-01 NOTE — Progress Notes (Signed)
AIRAM, HEIDECKER ([id number]) Visit Report for 07/01/2015 Allergy List Details Patient Name: DANYAEL, ALIPIO. Date of Service: 07/01/2015 2:00 PM Medical Record Number: [id number] Patient Account Number: [id number] Date of Birth/Sex: 1951-03-12 (64 y.o. Male) Treating RN: Clover Mealy, RN, BSN, [place] Sink Primary Care Physician: Sherrie Mustache Other Clinician: Referring Physician: Sherrie Mustache Treating Physician/Extender: Rudene Re in Treatment: 0 Allergies Active Allergies morphine Reaction: hallucinations Allergy Notes Electronic Signature(s) Signed: 07/01/2015 2:25:45 PM By: Elpidio Eric BSN, RN Entered By: Elpidio Eric on 07/01/2015 14:25:45 Battey, [name] ([id number]) -------------------------------------------------------------------------------- Arrival Information Details Patient Name: CARLSON, BELLAND. Date of Service: 07/01/2015 2:00 PM Medical Record Number: [id number] Patient Account Number: [id number] Date of Birth/Sex: [birth year] (64 y.o. Male) Treating RN: Clover Mealy, RN, BSN, [place] Sink Primary Care Physician: Sherrie Mustache Other Clinician: Referring Physician: Sherrie Mustache Treating Physician/Extender: Rudene Re in Treatment: 0 Visit Information Patient Arrived: Wheel Chair Arrival Time: 14:16 Accompanied By: cousin Transfer Assistance: Manual Patient Identification Verified: Yes Secondary Verification Process Yes Completed: Patient Requires Transmission- No Based Precautions: Patient Has Alerts: Yes Patient Alerts: ABI L:0.99, R:1.25 History Since Last Visit Added or deleted any medications: No Any new allergies or adverse reactions: No Had a fall or experienced change in activities of daily living that may affect risk of falls: No Signs or symptoms of abuse/neglect since last visito No Hospitalized since last visit: No Pain Present Now: No Electronic Signature(s) Signed: 07/01/2015 2:38:57 PM By: Elpidio Eric BSN, RN Previous Signature: 07/01/2015  2:21:15 PM Version By: Elpidio Eric BSN, RN Previous Signature: 07/01/2015 2:18:00 PM Version By: Elpidio Eric BSN, RN Entered By: Elpidio Eric on 07/01/2015 14:38:56 [place], [name] ([id number]) -------------------------------------------------------------------------------- Clinic Level of Care Assessment Details Patient Name: [name]. Date of Service: 07/01/2015 2:00 PM Medical Record Number: [id number] Patient Account Number: [id number] Date of Birth/Sex: 01/07/1951 (64 y.o. Male) Treating RN: Clover Mealy, RN, BSN, [place] Sink Primary Care Physician: Sherrie Mustache Other Clinician: Referring Physician: Sherrie Mustache Treating Physician/Extender: Rudene Re in Treatment: 0 Clinic Level of Care Assessment Items TOOL 2 Quantity Score []  - Use when only an EandM is performed on the INITIAL visit 0 ASSESSMENTS - Nursing Assessment / Reassessment X - General Physical Exam (combine w/ comprehensive assessment (listed just 1 20 below) when performed on new pt. evals) X - Comprehensive Assessment (HX, ROS, Risk Assessments, Wounds Hx, etc.) 1 25 ASSESSMENTS - Wound and Skin Assessment / Reassessment []  - Simple Wound Assessment / Reassessment - one wound 0 []  - Complex Wound Assessment / Reassessment - multiple wounds 0 []  - Dermatologic / Skin Assessment (not related to wound area) 0 ASSESSMENTS - Ostomy and/or Continence Assessment and Care []  - Incontinence Assessment and Management 0 []  - Ostomy Care Assessment and Management (repouching, etc.) 0 PROCESS - Coordination of Care X - Simple Patient / Family Education for ongoing care 1 15 []  - Complex (extensive) Patient / Family Education for ongoing care 0 []  - Staff obtains Chiropractor, Records, Test Results / Process Orders 0 []  - Staff telephones HHA, Nursing Homes / Clarify orders / etc 0 []  - Routine Transfer to another Facility (non-emergent condition) 0 []  - Routine Hospital Admission (non-emergent condition) 0 []  - New  Admissions / Manufacturing engineer / Ordering NPWT, Apligraf, etc. 0 []  - Emergency Hospital Admission (emergent condition) 0 X - Simple Discharge Coordination 1 10 [place], [place] R. ([id number]) []  - Complex (extensive) Discharge Coordination 0 PROCESS - Special Needs []  - Pediatric / Minor Patient Management 0 []  - Isolation Patient  Management 0  - Hearing / Language / Visual special needs 0  - Assessment of Community assistance (transportation, D/C planning, etc.) 0  - Additional assistance / Altered mentation 0  - Support Surface(s) Assessment (bed, cushion, seat, etc.) 0 INTERVENTIONS - Wound Cleansing / Measurement  - Wound Imaging (photographs - any number of wounds) 0  - Wound Tracing (instead of photographs) 0  - Simple Wound Measurement - one wound 0  - Complex Wound Measurement - multiple wounds 0  - Simple Wound Cleansing - one wound 0  - Complex Wound Cleansing - multiple wounds 0 INTERVENTIONS - Wound Dressings  - Small Wound Dressing one or multiple wounds 0  - Medium Wound Dressing one or multiple wounds 0  - Large Wound Dressing one or multiple wounds 0  - Application of Medications - injection 0 INTERVENTIONS - Miscellaneous  - External ear exam 0  - Specimen Collection (cultures, biopsies, blood, body fluids, etc.) 0  - Specimen(s) / Culture(s) sent or taken to Lab for analysis 0  - Patient Transfer (multiple staff / Nurse, adult / Similar devices) 0  - Simple Staple / Suture removal (25 or less) 0  - Complex Staple / Suture removal (26 or more) 0 [place], Flay R. ([id number])  - Hypo / Hyperglycemic Management (close monitor of Blood Glucose) 0  - Ankle / Brachial Index (ABI) - do not check if billed separately 0 Has the patient been seen at the hospital within the last three years: Yes Total Score: 70 Level Of Care: New/Established - Level 2 Electronic Signature(s) Signed: 07/01/2015 3:05:34 PM By: Elpidio Eric BSN,  RN Entered By: Elpidio Eric on 07/01/2015 15:05:33 Staller, [name] ([id number]) -------------------------------------------------------------------------------- Encounter Discharge Information Details Patient Name: OVILA, LEPAGE R. Date of Service: 07/01/2015 2:00 PM Medical Record Number: [id number] Patient Account Number: [id number] Date of Birth/Sex: [birth year] (64 y.o. Male) Treating RN: Clover Mealy, RN, BSN, [place] Sink Primary Care Physician: Sherrie Mustache Other Clinician: Referring Physician: Sherrie Mustache Treating Physician/Extender: Rudene Re in Treatment: 0 Encounter Discharge Information Items Discharge Pain Level: 0 Discharge Condition: Stable Ambulatory Status: Wheelchair Discharge Destination: Home Private Transportation: Auto Accompanied By: cousin Schedule Follow-up Appointment: No Medication Reconciliation completed and No provided to Patient/Care Lular Letson: Clinical Summary of Care: Electronic Signature(s) Signed: 07/01/2015 3:04:24 PM By: Elpidio Eric BSN, RN Entered By: Elpidio Eric on 07/01/2015 15:04:24 H[place], [name] ([id number]) -------------------------------------------------------------------------------- General Visit Notes Details Patient Name: [name]. Date of Service: 07/01/2015 2:00 PM Medical Record Number: [id number] Patient Account Number: [id number] Date of Birth/Sex: [birth year] (64 y.o. Male) Treating RN: Clover Mealy, RN, BSN, [place] Sink Primary Care Physician: Sherrie Mustache Other Clinician: Referring Physician: Sherrie Mustache Treating Physician/Extender: Rudene Re in Treatment: 0 Notes Patient present with swelling and redness of bilateral lower extremities. No wounds present Electronic Signature(s) Signed: 07/01/2015 2:31:12 PM By: Elpidio Eric BSN, RN Entered By: Elpidio Eric on 07/01/2015 14:31:12 Caris, [name] ([id number]) -------------------------------------------------------------------------------- Lower Extremity  Assessment Details Patient Name: CLEMENT, DENEAULT. Date of Service: 07/01/2015 2:00 PM Medical Record Number: [id number] Patient Account Number: [id number] Date of Birth/Sex: July 01, 1951 (64 y.o. Male) Treating RN: Clover Mealy, RN, BSN, [place] Sink Primary Care Physician: Sherrie Mustache Other Clinician: Referring Physician: Sherrie Mustache Treating Physician/Extender: Rudene Re in Treatment: 0 Edema Assessment Assessed: [Left: No] [Right: No] E[Left: dema] [Right: :] Calf Left: Right: Point of Measurement: 38 cm From Medial Instep 52 cm 50 cm Ankle Left: Right: Point of Measurement: 9 cm From Medial Instep 31.5 cm 31.2 cm Vascular Assessment  Pulses: Posterior Tibial Dorsalis Pedis Palpable: [Left:Yes] [Right:Yes] Extremity colors, hair growth, and conditions: Extremity Color: [Left:Hyperpigmented] [Right:Hyperpigmented] Hair Growth on Extremity: [Left:No] [Right:No] Temperature of Extremity: [Left:Warm] [Right:Warm] Capillary Refill: [Left:< 3 seconds] [Right:< 3 seconds] Dependent Rubor: [Left:No] [Right:No] Blanched when Elevated: [Left:No] [Right:No] Lipodermatosclerosis: [Left:No] [Right:No] Toe Nail Assessment Left: Right: Thick: Yes Yes Discolored: Yes Yes Deformed: Yes Yes Improper Length and Hygiene: Yes Yes Electronic Signature(s) Signed: 07/01/2015 2:37:43 PM By: Elpidio Eric BSN, RN [address], [place] R. ([id number]) Entered By: Elpidio Eric on 07/01/2015 14:37:42 Witczak, [name] ([id number]) -------------------------------------------------------------------------------- Multi Wound Chart Details Patient Name: [name]. Date of Service: 07/01/2015 2:00 PM Medical Record Number: [id number] Patient Account Number: [id number] Date of Birth/Sex: [birth year] (64 y.o. Male) Treating RN: Clover Mealy, RN, BSN, [place] Sink Primary Care Physician: Sherrie Mustache Other Clinician: Referring Physician: Sherrie Mustache Treating Physician/Extender: Rudene Re in Treatment: 0 Vital  Signs Height(in): Pulse(bpm): 78 Weight(lbs): Blood Pressure 158/84 (mmHg): Body Mass Index(BMI): Temperature(F): 98.4 Respiratory Rate 18 (breaths/min): Wound Assessments Treatment Notes Electronic Signature(s) Signed: 07/01/2015 2:49:48 PM By: Elpidio Eric BSN, RN Entered By: Elpidio Eric on 07/01/2015 14:49:47 Hineman, [name] ([id number]) -------------------------------------------------------------------------------- Multi-Disciplinary Care Plan Details Patient Name: EGE, MUCKEY. Date of Service: 07/01/2015 2:00 PM Medical Record Number: [id number] Patient Account Number: [id number] Date of Birth/Sex: 09/15/1951 (64 y.o. Male) Treating RN: Clover Mealy, RN, BSN, [place] Sink Primary Care Physician: Sherrie Mustache Other Clinician: Referring Physician: Sherrie Mustache Treating Physician/Extender: Rudene Re in Treatment: 0 Active Inactive Orientation to the Wound Care Program Nursing Diagnoses: Knowledge deficit related to the wound healing center program Goals: Patient/caregiver will verbalize understanding of the Wound Healing Center Program Date Initiated: 07/01/2015 Goal Status: Active Interventions: Provide education on orientation to the wound center Notes: Venous Leg Ulcer Nursing Diagnoses: Knowledge deficit related to disease process and management Potential for venous Insuffiency (use before diagnosis confirmed) Goals: Non-invasive venous studies are completed as ordered Date Initiated: 07/01/2015 Goal Status: Active Patient will maintain optimal edema control Date Initiated: 07/01/2015 Goal Status: Active Patient/caregiver will verbalize understanding of disease process and disease management Date Initiated: 07/01/2015 Goal Status: Active Verify adequate tissue perfusion prior to therapeutic compression application Date Initiated: 07/01/2015 Goal Status: Active Interventions: Assess peripheral edema status every visit. TRAY, KLAYMAN  ([id number]) Compression as ordered Provide education on venous insufficiency Treatment Activities: Non-invasive vascular studies : 07/01/2015 Therapeutic compression applied : 07/01/2015 Notes: Wound/Skin Impairment Nursing Diagnoses: Impaired tissue integrity Knowledge deficit related to ulceration/compromised skin integrity Goals: Patient/caregiver will verbalize understanding of skin care regimen Date Initiated: 07/01/2015 Goal Status: Active Ulcer/skin breakdown will have a volume reduction of 30% by week 4 Date Initiated: 07/01/2015 Goal Status: Active Ulcer/skin breakdown will have a volume reduction of 50% by week 8 Date Initiated: 07/01/2015 Goal Status: Active Ulcer/skin breakdown will have a volume reduction of 80% by week 12 Date Initiated: 07/01/2015 Goal Status: Active Ulcer/skin breakdown will heal within 14 weeks Date Initiated: 07/01/2015 Goal Status: Active Interventions: Assess patient/caregiver ability to perform ulcer/skin care regimen upon admission and as needed Assess ulceration(s) every visit Provide education on ulcer and skin care Treatment Activities: Skin care regimen initiated : 07/01/2015 Notes: Electronic Signature(s) Signed: 07/01/2015 2:52:25 PM By: Elpidio Eric BSN, RN Previous Signature: 07/01/2015 2:49:36 PM Version By: Elpidio Eric BSN, RN [place], Ercel R. ([id number]) Entered By: Elpidio Eric on 07/01/2015 14:52:24 Bodnar, [name] ([id number]) -------------------------------------------------------------------------------- Pain Assessment Details Patient Name: MARSELINO, SLAYTON. Date of Service: 07/01/2015 2:00 PM Medical Record Number: [id number] Patient Account Number: [id number]  Date of Birth/Sex: [birth year] (64 y.o. Male) Treating RN: Clover Mealy, RN, BSN, [place] Sink Primary Care Physician: Sherrie Mustache Other Clinician: Referring Physician: Sherrie Mustache Treating Physician/Extender: Rudene Re in Treatment: 0 Active Problems Location of  Pain Severity and Description of Pain Patient Has Paino No Site Locations Pain Management and Medication Current Pain Management: Electronic Signature(s) Signed: 07/01/2015 2:21:04 PM By: Elpidio Eric BSN, RN Entered By: Elpidio Eric on 07/01/2015 14:21:04 Tyminski, [name] ([id number]) -------------------------------------------------------------------------------- Patient/Caregiver Education Details Patient Name: BO, TEICHER. Date of Service: 07/01/2015 2:00 PM Medical Record Number: [id number] Patient Account Number: [id number] Date of Birth/Gender: Dec 25, 1950 (64 y.o. Male) Treating RN: Clover Mealy, RN, BSN, [place] Sink Primary Care Physician: Sherrie Mustache Other Clinician: Referring Physician: Sherrie Mustache Treating Physician/Extender: Rudene Re in Treatment: 0 Education Assessment Education Provided To: Patient and Caregiver Education Topics Provided Basic Hygiene: Methods: Explain/Verbal Responses: State content correctly Venous: Methods: Explain/Verbal Responses: State content correctly Welcome To The Wound Care Center: Methods: Explain/Verbal Responses: State content correctly Wound/Skin Impairment: Methods: Explain/Verbal Responses: State content correctly Electronic Signature(s) Signed: 07/01/2015 3:04:55 PM By: Elpidio Eric BSN, RN Entered By: Elpidio Eric on 07/01/2015 15:04:55 Kohen, [name] ([id number]) -------------------------------------------------------------------------------- Vitals Details Patient Name: [name]. Date of Service: 07/01/2015 2:00 PM Medical Record Number: [id number] Patient Account Number: [id number] Date of Birth/Sex: [birth year] (64 y.o. Male) Treating RN: Clover Mealy, RN, BSN, [place] Sink Primary Care Physician: Sherrie Mustache Other Clinician: Referring Physician: Sherrie Mustache Treating Physician/Extender: Rudene Re in Treatment: 0 Vital Signs Time Taken: 14:41 Temperature (F): 98.4 Pulse (bpm): 78 Respiratory Rate  (breaths/min): 18 Blood Pressure (mmHg): 158/84 Reference Range: 80 - 120 mg / dl Electronic Signature(s) Signed: 07/01/2015 2:22:20 PM By: Elpidio Eric BSN, RN Entered By: Elpidio Eric on 07/01/2015 14:22:20

## 2015-07-02 NOTE — Progress Notes (Signed)
[name] ([id number]) Visit Report for 07/01/2015 Chief Complaint Document Details Patient Name: [name], [name]. Date of Service: 07/01/2015 2:00 PM Medical Record Number: [id number] Patient Account Number: [id number] Date of Birth/Sex: Aug 06, 1951 (64 y.o. Male) Treating RN: Clover Mealy, RN, BSN, [place] Sink Primary Care Physician: Sherrie Mustache Other Clinician: Referring Physician: Sherrie Mustache Treating Physician/Extender: Rudene Re in Treatment: 0 Information Obtained from: Patient Chief Complaint Patient returns to the wound care center for reopened ulcer to: the left lower extremity with swelling. Electronic Signature(s) Signed: 07/01/2015 2:57:26 PM By: Evlyn Kanner MD, FACS Entered By: Evlyn Kanner on 07/01/2015 14:57:26 [name] ([id number]) -------------------------------------------------------------------------------- HPI Details Patient Name: [name], [name]. Date of Service: 07/01/2015 2:00 PM Medical Record Number: [id number] Patient Account Number: [id number] Date of Birth/Sex: 1951-05-22 (64 y.o. Male) Treating RN: Clover Mealy, RN, BSN, [place] Sink Primary Care Physician: Sherrie Mustache Other Clinician: Referring Physician: Sherrie Mustache Treating Physician/Extender: Rudene Re in Treatment: 0 History of Present Illness Location: left calf Quality: Patient reports experiencing a dull pain to affected area(s). Severity: Patient states wound are getting worse. Duration: Patient has had the wound for > 3 months prior to seeking treatment at the wound center Timing: Pain in wound is Intermittent (comes and goes Context: The wound appeared gradually over time Modifying Factors: Other treatment(s) tried include: antibiotic which she is not sure about Associated Signs and Symptoms: Patient reports having difficulty standing for long periods. HPI Description: this 64 year old gentleman comes to see [place] for bilateral swelling of the lower extremities with weeping  of an ulcer on the left lower extremity. Other comorbidities include morbid obesity, hyperlipidemia, diabetes mellitus type 2, COPD, hypertension, congestive heart failure and prostate problems. He was here in February of this year and we had asked him to get vascular studies and he does say that he has had them done but does not know the results. He wears his compression stockings on and off but recently has not been wearing them because they get too tight. Electronic Signature(s) Signed: 07/01/2015 3:05:05 PM By: Evlyn Kanner MD, FACS Entered By: Evlyn Kanner on 07/01/2015 15:05:05 [name], [name] ([id number]) -------------------------------------------------------------------------------- Physical Exam Details Patient Name: [name]. Date of Service: 07/01/2015 2:00 PM Medical Record Number: [id number] Patient Account Number: [id number] Date of Birth/Sex: June 07, 1951 (64 y.o. Male) Treating RN: Clover Mealy, RN, BSN, [place] Sink Primary Care Physician: Sherrie Mustache Other Clinician: Referring Physician: Sherrie Mustache Treating Physician/Extender: Rudene Re in Treatment: 0 Constitutional . Pulse regular. Respirations normal and unlabored. Afebrile. . Eyes Nonicteric. Reactive to light. Ears, Nose, Mouth, and Throat Lips, teeth, and gums WNL.Marland Kitchen Moist mucosa without lesions . Neck supple and nontender. No palpable supraclavicular or cervical adenopathy. Normal sized without goiter. Respiratory WNL. No retractions.. Cardiovascular he has palpable pulses and the left ABI 0.99 on the right ABI is 1.25.. he has bilateral +2 pitting edema and the left seems a little more than the right.. Gastrointestinal (GI) Abdomen without masses or tenderness.. No liver or spleen enlargement or tenderness.. Genitourinary (GU) No hydrocele, spermatocele, tenderness of the cord, or testicular mass.Marland Kitchen Penis without lesions.Renetta Chalk without lesions. No cystocele, or rectocele. Pelvic support  intact, no discharge. Marland Kitchen Urethra without masses, tenderness or scarring.Marland Kitchen Lymphatic No adneopathy. No adenopathy. No adenopathy. Musculoskeletal Adexa without tenderness or enlargement.. Digits and nails w/o clubbing, cyanosis, infection, petechiae, ischemia, or inflammatory conditions.. Integumentary (Hair, Skin) No suspicious lesions. No crepitus or fluctuance. No peri-wound warmth or erythema. No masses.Marland Kitchen Psychiatric Judgement and insight Intact.. No evidence of depression,  anxiety, or agitation.. Notes He has got small ulcerations on the left lower extremity but no open wounds. The left lower extremity as stage II lymphedema. Electronic Signature(s) Signed: 07/01/2015 3:07:15 PM By: Evlyn Kanner MD, FACS [name], [name] ([id number]) Entered By: Evlyn Kanner on 07/01/2015 15:07:14 [name], [name] ([id number]) -------------------------------------------------------------------------------- Physician Orders Details Patient Name: [name], [name]. Date of Service: 07/01/2015 2:00 PM Medical Record Number: [id number] Patient Account Number: [id number] Date of Birth/Sex: 11-23-1950 (64 y.o. Male) Treating RN: Clover Mealy, RN, BSN, [place] Sink Primary Care Physician: Sherrie Mustache Other Clinician: Referring Physician: Sherrie Mustache Treating Physician/Extender: Rudene Re in Treatment: 0 Verbal / Phone Orders: Yes Clinician: Afful, RN, BSN, Rita Read Back and Verified: Yes Diagnosis Coding Skin Barriers/Peri-Wound Care o Moisturizing lotion Follow-up Appointments o Return Appointment in 1 week. - on thursday or friday Edema Control o 2 Layer Compression System - Left Lower Extremity Electronic Signature(s) Signed: 07/01/2015 2:53:14 PM By: Elpidio Eric BSN, RN Signed: 07/01/2015 4:00:25 PM By: Evlyn Kanner MD, FACS Previous Signature: 07/01/2015 2:50:51 PM Version By: Elpidio Eric BSN, RN Entered By: Elpidio Eric on 07/01/2015 14:53:14 [name]  ([id number]) -------------------------------------------------------------------------------- Problem List Details Patient Name: [name], [name]. Date of Service: 07/01/2015 2:00 PM Medical Record Number: [id number] Patient Account Number: [id number] Date of Birth/Sex: [birth year] (64 y.o. 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 Date of Service: 07/01/2015 2:00 PM Medical Record Number: [id number] Patient Account Number: [id number] Date of Birth/Sex: Apr 02, 1951 (64 y.o. Male) Treating RN: Clover Mealy, RN, BSN, [place] Sink Primary Care Physician: Sherrie Mustache Other Clinician: Referring Physician: Sherrie Mustache Treating Physician/Extender: Rudene Re in Treatment: 0 Subjective Chief Complaint Information obtained from Patient Patient returns to the wound care  center for reopened ulcer to: the left lower extremity with swelling. History of Present Illness (HPI) The following HPI elements were documented for the patient's wound: Location: left calf Quality: Patient reports experiencing a dull pain to affected area(s). Severity: Patient states wound are getting worse. Duration: Patient has had the wound for > 3 months prior to seeking treatment at the wound center Timing: Pain in wound is Intermittent (comes and goes Context: The wound appeared gradually over time Modifying Factors: Other treatment(s) tried include: antibiotic which she is not sure about Associated Signs and Symptoms: Patient reports having difficulty standing for long periods. this 64 year old gentleman comes to see [place] for bilateral swelling of the lower extremities with weeping of an ulcer on the left lower extremity. Other comorbidities include morbid obesity, hyperlipidemia, diabetes mellitus type 2, COPD, hypertension, congestive heart failure and prostate problems. He was here in February of this year and we had asked him to get vascular studies and  he does say that he has had them done but does not know the results. He wears his compression stockings on and off but recently has not been wearing them because they get too tight. Wound History Patient reportedly has not tested positive for osteomyelitis. Patient experiences the following problems associated with their wounds: infection, swelling. Patient History Information obtained from Patient. Allergies morphine (Reaction: hallucinations) Family History Cancer - Father, Heart Disease - Mother, Hypertension - Mother, [name], [name] ([id number]) No family history of Diabetes, Hereditary Spherocytosis, Kidney Disease, Lung Disease, Seizures, Stroke, Thyroid Problems, Tuberculosis. Social History Former smoker - quit 30 years ago , Marital Status - Married, Alcohol Use - Never, Drug Use - No History, Caffeine Use -  Daily. Medical History Gastrointestinal Denies history of Cirrhosis , Colitis, Crohn s, Hepatitis A, Hepatitis B Endocrine Patient has history of Type II Diabetes Immunological Denies history of Lupus Erythematosus, Raynaud s, Scleroderma Neurologic Denies history of Dementia, Neuropathy, Quadriplegia, Paraplegia Oncologic Denies history of Received Chemotherapy, Received Radiation Patient is treated with Oral Agents. Blood sugar results noted at the following times: Breakfast - 201. Medical And Surgical History Notes Cardiovascular Heart murmur; Heart cath at the time of MI Review of Systems (ROS) Ear/Nose/Mouth/Throat The patient has no complaints or symptoms. Respiratory Complains or has symptoms of Shortness of Breath. Cardiovascular The patient has no complaints or symptoms. Gastrointestinal The patient has no complaints or symptoms. Genitourinary The patient has no complaints or symptoms. Immunological The patient has no complaints or symptoms. Musculoskeletal The patient has no complaints or symptoms. Neurologic The patient has no complaints or symptoms. Oncologic The patient has no complaints or symptoms. Medications: I have reviewed her list of his medications which include Glucotrol XL Lotrisone. He also takes Crestor, Celebrex, tamsulosin, telmisartan, furosemide, diastolic, aspirin, Kaochlor, Victoza. RITA, PROM ([id number]) Objective Constitutional Pulse regular. Respirations normal and unlabored. Afebrile. Vitals Time Taken: 2:41 PM, Temperature: 98.4 F, Pulse: 78 bpm, Respiratory Rate: 18 breaths/min, Blood Pressure: 158/84 mmHg. Eyes Nonicteric. Reactive to light. Ears, Nose, Mouth, and Throat Lips, teeth, and gums WNL.Marland Kitchen Moist mucosa without lesions . Neck supple and nontender. No palpable supraclavicular or cervical adenopathy. Normal sized without goiter. Respiratory WNL. No retractions.. Cardiovascular he has palpable pulses and the left  ABI 0.99 on the right ABI is 1.25.. he has bilateral +2 pitting edema and the left seems a little more than the right.. Gastrointestinal (GI) Abdomen without masses or tenderness.. No liver or spleen enlargement or tenderness.. Genitourinary (GU) No hydrocele, spermatocele, tenderness of the cord, or testicular mass.Marland Kitchen Penis without lesions.Renetta Chalk without lesions. No cystocele, or rectocele. Pelvic support intact, no discharge. Marland Kitchen Urethra without masses, tenderness or scarring.Marland Kitchen Lymphatic No adneopathy. No adenopathy. No adenopathy. Musculoskeletal Adexa without tenderness or enlargement.. Digits and nails w/o clubbing, cyanosis, infection, petechiae, ischemia, or inflammatory conditions.Marland Kitchen Psychiatric [name], [name] ([id number]) Judgement and insight Intact.. No evidence of depression, anxiety, or agitation.. General Notes: He has got small ulcerations on the left lower extremity but no open wounds. The left lower extremity as stage II lymphedema. Integumentary (Hair, Skin) No suspicious lesions. No crepitus or fluctuance. No peri-wound warmth or erythema. No masses.. 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 In the past we have seen him and recommended elevation, exercise and class and compression stockings. The venous duplex studies were ordered for last time and he says it is done but we do not have the reports and we'll  try to obtain these. I have again recommended elevation of both lower extremities and we will use a 2 layer compression on him today to start off with. We will also ask him to come back with his compression stockings so that if he is better by next week we can send him home with compression stockings. He also understands the importance of elevation of the limb and says that he will be compliant. Plan Skin Barriers/Peri-Wound Care: Moisturizing lotion Follow-up Appointments: Return Appointment in 1 week. - on thursday or friday Edema Control: 2 Layer Compression System - Left Lower Extremity [name], [name] ([id number]) This gentleman who has multiple comorbidities presents today with stage II lymphedema of both lower extremities left being worse than the right. In the past we have seen him and recommended elevation, exercise and class and compression stockings. The venous duplex studies were ordered for last time and he says it is done but we do not have the reports and we'll try to obtain these. I have again recommended elevation of both lower extremities and we will use a 2 layer compression on him today to start off with. We will also ask him to come back with his compression stockings so that if he is better by next week we can send him home with compression stockings. He also understands the importance of elevation of the limb and says that he will be compliant. Electronic Signature(s) Signed: 07/01/2015 3:12:30 PM By: Evlyn Kanner MD, FACS Previous Signature: 07/01/2015 3:10:53 PM Version By: Evlyn Kanner MD, FACS Entered By: Evlyn Kanner on 07/01/2015 15:12:29 [name], [name] ([id number]) -------------------------------------------------------------------------------- ROS/PFSH Details Patient Name: [name], [name]. Date of Service: 07/01/2015 2:00 PM Medical Record Number: [id number] Patient Account Number: [id number] Date of Birth/Sex: December 05, 1950 (64 y.o. Male) Treating RN:  Clover Mealy, RN, BSN, [place] Sink Primary Care Physician: Sherrie Mustache Other Clinician: Referring Physician: Sherrie Mustache Treating Physician/Extender: Rudene Re in Treatment: 0 Information Obtained From Patient Wound History Do you currently have one or more open woundso No Have you tested positive for osteomyelitis (bone infection)o No Have you had other problems associated with your woundso Infection, Swelling Respiratory Complaints and Symptoms: Positive for: Shortness of Breath Medical History: Positive for: Chronic Obstructive Pulmonary Disease (COPD); Sleep Apnea - does not wear CPAP Ear/Nose/Mouth/Throat Complaints and Symptoms: No Complaints or Symptoms Medical History: Positive for: Chronic sinus problems/congestion Cardiovascular Complaints and Symptoms: No Complaints or Symptoms Medical History: Positive for: Congestive Heart Failure; Hypertension; Myocardial Infarction - 1990's Negative for: Arrhythmia Past Medical History Notes: Heart murmur; Heart cath at the time of MI Gastrointestinal Complaints and Symptoms: No Complaints or Symptoms Medical History: Negative for: Cirrhosis ; Colitis; Crohnos; Hepatitis A; Hepatitis B BETZALEL, UMBARGER ([id number]) Endocrine Medical History: Positive for: Type II Diabetes Time with diabetes: 15 years Treated with: Oral agents Blood sugar testing results: Breakfast: 201 Genitourinary Complaints and Symptoms: No Complaints or Symptoms Immunological Complaints and Symptoms: No Complaints or Symptoms Medical History: Negative for: Lupus Erythematosus; Raynaudos; Scleroderma Integumentary (Skin) Medical History: Negative for: History of Burn; History of pressure wounds Musculoskeletal Complaints and  Symptoms: No Complaints or Symptoms Medical History: Positive for: Osteoarthritis Neurologic Complaints and Symptoms: No Complaints or Symptoms Medical History: Negative for: Dementia; Neuropathy; Quadriplegia;  Paraplegia Oncologic Complaints and Symptoms: No Complaints or Symptoms Medical History: Negative for: Received Chemotherapy; Received Radiation Psychiatric HUSSAM, MUNIZ ([id number]) Medical History: Positive for: Confinement Anxiety HBO Extended History Items Ear/Nose/Mouth/Throat: Chronic sinus problems/congestion Family and Social History Cancer: Yes - Father; Diabetes: No; Heart Disease: Yes - Mother; Hereditary Spherocytosis: No; Hypertension: Yes - Mother; Kidney Disease: No; Lung Disease: No; Seizures: No; Stroke: No; Thyroid Problems: No; Tuberculosis: No; Former smoker - quit 30 years ago ; Marital Status - Married; Alcohol Use: Never; Drug Use: No History; Caffeine Use: Daily; Financial Concerns: No; Food, Clothing or Shelter Needs: No; Support System Lacking: No; Transportation Concerns: No; Advanced Directives: Yes (Not Provided); Patient does not want information on Advanced Directives; Do not resuscitate: No; Living Will: Yes (Not Provided); Medical Power of Attorney: Yes - Recardo Linn- wife (Not Provided) Physician Affirmation I have reviewed and agree with the above information. Electronic Signature(s) Signed: 07/01/2015 3:07:34 PM By: Evlyn Kanner MD, FACS Signed: 07/01/2015 4:40:30 PM By: Elpidio Eric BSN, RN Previous Signature: 07/01/2015 2:30:11 PM Version By: Elpidio Eric BSN, RN Previous Signature: 07/01/2015 2:29:56 PM Version By: Elpidio Eric BSN, RN Entered By: Evlyn Kanner on 07/01/2015 15:07:32 [place], [name] ([id number]) -------------------------------------------------------------------------------- SuperBill Details Patient Name: VALIANT, DILLS. Date of Service: 07/01/2015 Medical Record Number: [id number] Patient Account Number: [id number] Date of Birth/Sex: 1951/04/24 (64 y.o. Male) Treating RN: Clover Mealy, RN, BSN, Rita Primary Care Physician: Sherrie Mustache Other Clinician: Referring Physician: Sherrie Mustache Treating Physician/Extender: Rudene Re in Treatment: 0 Diagnosis Coding ICD-10 Codes Code Description [phone] Type 2 diabetes mellitus with other skin ulcer E08.59 Diabetes mellitus due to underlying condition with other circulatory complications L97.221 Non-pressure chronic ulcer of left calf limited to breakdown of skin Chronic venous hypertension (idiopathic) with ulcer and inflammation of left lower I87.332 extremity I50.22 Chronic systolic (congestive) heart failure I89.0 Lymphedema, not elsewhere classified Facility Procedures CPT4: Description Modifier Quantity Code [id number] 99212 - WOUND CARE VISIT-LEV 2 EST PT 1 CPT4: 84132440 (Facility Use Only) 29581LT - APPLY MULTLAY COMPRS LWR LT 1 LEG Physician Procedures CPT4: Description Modifier Quantity Code [id number] 99214 - WC PHYS LEVEL 4 - EST PT 1 ICD-10 Description Diagnosis E11.622 Type 2 diabetes mellitus with other skin ulcer E08.59 Diabetes mellitus due to underlying condition with other circulatory  complications I89.0 Lymphedema, not elsewhere classified I50.22 Chronic systolic (congestive) heart failure Electronic Signature(s) Signed: 07/01/2015 3:11:36 PM By: Evlyn Kanner MD, FACS Previous Signature: 07/01/2015 3:05:57 PM Version By: Elpidio Eric BSN, RN Entered By: Evlyn Kanner on 07/01/2015 15:11:35 [place], Jedediah R. ([id number])

## 2015-07-10 ENCOUNTER — Encounter: Payer: Medicare Other | Attending: Surgery | Admitting: Surgery

## 2015-07-10 DIAGNOSIS — I5022 Chronic systolic (congestive) heart failure: Secondary | ICD-10-CM | POA: Insufficient documentation

## 2015-07-10 DIAGNOSIS — I890 Lymphedema, not elsewhere classified: Secondary | ICD-10-CM | POA: Insufficient documentation

## 2015-07-10 DIAGNOSIS — E11622 Type 2 diabetes mellitus with other skin ulcer: Secondary | ICD-10-CM | POA: Diagnosis not present

## 2015-07-10 DIAGNOSIS — I10 Essential (primary) hypertension: Secondary | ICD-10-CM | POA: Insufficient documentation

## 2015-07-10 DIAGNOSIS — L97222 Non-pressure chronic ulcer of left calf with fat layer exposed: Secondary | ICD-10-CM | POA: Insufficient documentation

## 2015-07-10 DIAGNOSIS — I87332 Chronic venous hypertension (idiopathic) with ulcer and inflammation of left lower extremity: Secondary | ICD-10-CM | POA: Diagnosis not present

## 2015-07-10 DIAGNOSIS — J449 Chronic obstructive pulmonary disease, unspecified: Secondary | ICD-10-CM | POA: Diagnosis not present

## 2015-07-10 DIAGNOSIS — L97221 Non-pressure chronic ulcer of left calf limited to breakdown of skin: Secondary | ICD-10-CM | POA: Diagnosis not present

## 2015-07-10 NOTE — Progress Notes (Signed)
[name] ([id number]) Visit Report for 07/10/2015 Arrival Information Details Patient Name: Ernest, [name]. Date of Service: 07/10/2015 10:15 AM Medical Record Number: [id number] Patient Account Number: [id number] Date of Birth/Sex: 1951/08/20 (64 y.o. Male) Treating RN: Clover Mealy, RN, BSN, [place] Sink Primary Care Physician: Sherrie Mustache Other Clinician: Referring Physician: Sherrie Mustache Treating Physician/Extender: Rudene Re in Treatment: 1 Visit Information History Since Last Visit Any new allergies or adverse reactions: No Patient Arrived: Wheel Chair Had a fall or experienced change in No Arrival Time: 10:09 activities of daily living that may affect Accompanied By: son risk of falls: Transfer Assistance: None Signs or symptoms of abuse/neglect since last No Patient Identification Verified: Yes visito Secondary Verification Process Yes Hospitalized since last visit: No Completed: Has Dressing in Place as Prescribed: Yes Patient Requires Transmission- No Has Compression in Place as Prescribed: Yes Based Precautions: Pain Present Now: No Patient Has Alerts: Yes Patient Alerts: ABI L:0.99, R:1.25 Electronic Signature(s) Signed: 07/10/2015 10:40:12 AM By: Elpidio Eric BSN, RN Previous Signature: 07/10/2015 10:09:56 AM Version By: Elpidio Eric BSN, RN Entered By: Elpidio Eric on 07/10/2015 10:40:12 [name] ([id number]) -------------------------------------------------------------------------------- Encounter Discharge Information Details Patient Name: Ernest, [name]. Date of Service: 07/10/2015 10:15 AM Medical Record Number: [id number] Patient Account Number: [id number] Date of Birth/Sex: 03-05-1951 (64 y.o. Male) Treating RN: Clover Mealy, RN, BSN, [place] Sink Primary Care Physician: Sherrie Mustache Other Clinician: Referring Physician: Sherrie Mustache Treating Physician/Extender: Rudene Re in Treatment: 1 Encounter Discharge Information Items Discharge Pain  Level: 0 Discharge Condition: Stable Ambulatory Status: Wheelchair Discharge Destination: Home Transportation: Private Auto Accompanied By: son Schedule Follow-up Appointment: No Medication Reconciliation completed No and provided to Patient/Care Anastaisa Wooding: Provided on Clinical Summary of Care: 07/10/2015 Form Type Recipient Paper Patient GW Electronic Signature(s) Signed: 07/10/2015 10:39:41 AM By: Elpidio Eric BSN, RN Previous Signature: 07/10/2015 10:34:26 AM Version By: Gwenlyn Perking Entered By: Elpidio Eric on 07/10/2015 10:39:41 [name] ([id number]) -------------------------------------------------------------------------------- Lower Extremity Assessment Details Patient Name: Ernest, KEESEY R. Date of Service: 07/10/2015 10:15 AM Medical Record Number: [id number] Patient Account Number: [id number] Date of Birth/Sex: 05-19-1951 (64 y.o. Male) Treating RN: Clover Mealy, RN, BSN, [place] Sink Primary Care Physician: Sherrie Mustache Other Clinician: Referring Physician: Sherrie Mustache Treating Physician/Extender: Rudene Re in Treatment: 1 Edema Assessment Assessed: [Left: No] [Right: No] E[Left: dema] [Right: :] Calf Left: Right: Point of Measurement: 38 cm From Medial Instep 52 cm cm Ankle Left: Right: Point of Measurement: 9 cm From Medial Instep 31.5 cm cm Vascular Assessment Pulses: Posterior Tibial Dorsalis Pedis Palpable: [Left:Yes] Extremity colors, hair growth, and conditions: Extremity Color: [Left:Hyperpigmented] Hair Growth on Extremity: [Left:No] Temperature of Extremity: [Left:Warm] Toe Nail Assessment Left: Right: Thick: Yes Discolored: Yes Deformed: Yes Improper Length and Hygiene: Yes Electronic Signature(s) Signed: 07/10/2015 10:15:27 AM By: Elpidio Eric BSN, RN Previous Signature: 07/10/2015 10:15:16 AM Version By: Elpidio Eric BSN, RN Entered By: Elpidio Eric on 07/10/2015 10:15:27 [name]  ([id number]) -------------------------------------------------------------------------------- Multi Wound Chart Details Patient Name: Ernest, MEJORADO R. Date of Service: 07/10/2015 10:15 AM Medical Record Number: [id number] Patient Account Number: [id number] Date of Birth/Sex: 1950/11/16 (64 y.o. Male) Treating RN: Clover Mealy, RN, BSN,  Sink Primary Care Physician: Sherrie Mustache Other Clinician: Referring Physician: Sherrie Mustache Treating Physician/Extender: Rudene Re in Treatment: 1 Vital Signs Height(in): Pulse(bpm): 78 Weight(lbs): Blood Pressure 153/80 (mmHg): Body Mass Index(BMI): Temperature(F): 97.8 Respiratory Rate 18 (breaths/min): Photos: [2:No Photos] [N/A:N/A] Wound Location: [2:Left Lower Leg - Lateral] [N/A:N/A] Wounding Event: [2:Gradually Appeared] [N/A:N/A] Primary Etiology: [2:Lymphedema] [N/A:N/A]  Comorbid History: [2:Chronic sinus problems/congestion, Chronic Obstructive Pulmonary Disease (COPD), Sleep Apnea, Congestive Heart Failure, Hypertension, Myocardial Infarction, Type II Diabetes, Osteoarthritis, Confinement Anxiety] [N/A:N/A] Date Acquired: [2:07/10/2015] [N/A:N/A] Weeks of Treatment: [2:0] [N/A:N/A] Wound Status: [2:Open] [N/A:N/A] Measurements L x W x D 1.5x2x0.1 [N/A:N/A] (cm) Area (cm) : [2:2.356] [N/A:N/A] Volume (cm) : [2:0.236] [N/A:N/A] % Reduction in Area: [2:0.00%] [N/A:N/A] % Reduction in Volume: 0.00% [N/A:N/A] Classification: [2:Full Thickness Without Exposed Support Structures] [N/A:N/A] HBO Classification: [2:Grade 0] [N/A:N/A] Exudate Amount: [2:Medium] [N/A:N/A] Exudate Type: [2:Serosanguineous] [N/A:N/A] Exudate Color: [2:red, brown] [N/A:N/A] Wound Margin: Distinct, outline attached N/A N/A Granulation Amount: Small (1-33%) N/A N/A Granulation Quality: Pink, Pale N/A N/A Necrotic Amount: Medium (34-66%) N/A N/A Exposed Structures: Fascia: No N/A N/A Fat: No Tendon: No Muscle: No Joint: No Bone: No Limited to  Skin Breakdown Epithelialization: None N/A N/A Periwound Skin Texture: Edema: Yes N/A N/A Excoriation: No Induration: No Callus: No Crepitus: No Fluctuance: No Friable: No Rash: No Scarring: No Periwound Skin Moist: Yes N/A N/A Moisture: Maceration: No Dry/Scaly: No Periwound Skin Color: Mottled: Yes N/A N/A Atrophie Blanche: No Cyanosis: No Ecchymosis: No Erythema: No Hemosiderin Staining: No Pallor: No Rubor: No Temperature: No Abnormality N/A N/A Tenderness on No N/A N/A Palpation: Wound Preparation: Ulcer Cleansing: Other: N/A N/A water and soap Topical Anesthetic Applied: Other: lidocaine 4% Treatment Notes Electronic Signature(s) Signed: 07/10/2015 10:21:06 AM By: Elpidio Eric BSN, RN Entered By: Elpidio Eric on 07/10/2015 10:21:06 ROBSON, TRICKEY ([id number]) CONSTANT, MANDEVILLE ([id number]) -------------------------------------------------------------------------------- Multi-Disciplinary Care Plan Details Patient Name: Ernest, [name]. Date of Service: 07/10/2015 10:15 AM Medical Record Number: [id number] Patient Account Number: [id number] Date of Birth/Sex: 24-Feb-1951 (64 y.o. Male) Treating RN: Clover Mealy, RN, BSN, [place] Sink Primary Care Physician: Sherrie Mustache Other Clinician: Referring Physician: Sherrie Mustache Treating Physician/Extender: Rudene Re in Treatment: 1 Active Inactive Orientation to the Wound Care Program Nursing Diagnoses: Knowledge deficit related to the wound healing center program Goals: Patient/caregiver will verbalize understanding of the Wound Healing Center Program Date Initiated: 07/01/2015 Goal Status: Active Interventions: Provide education on orientation to the wound center Notes: Venous Leg Ulcer Nursing Diagnoses: Knowledge deficit related to disease process and management Potential for venous Insuffiency (use before diagnosis confirmed) Goals: Non-invasive venous studies are completed as ordered Date Initiated:  07/01/2015 Goal Status: Active Patient will maintain optimal edema control Date Initiated: 07/01/2015 Goal Status: Active Patient/caregiver will verbalize understanding of disease process and disease management Date Initiated: 07/01/2015 Goal Status: Active Verify adequate tissue perfusion prior to therapeutic compression application Date Initiated: 07/01/2015 Goal Status: Active Interventions: Assess peripheral edema status every visit. Ernest, [name] ([id number]) Compression as ordered Provide education on venous insufficiency Treatment Activities: Non-invasive vascular studies : 07/10/2015 Therapeutic compression applied : 07/10/2015 Notes: Wound/Skin Impairment Nursing Diagnoses: Impaired tissue integrity Knowledge deficit related to ulceration/compromised skin integrity Goals: Patient/caregiver will verbalize understanding of skin care regimen Date Initiated: 07/01/2015 Goal Status: Active Ulcer/skin breakdown will have a volume reduction of 30% by week 4 Date Initiated: 07/01/2015 Goal Status: Active Ulcer/skin breakdown will have a volume reduction of 50% by week 8 Date Initiated: 07/01/2015 Goal Status: Active Ulcer/skin breakdown will have a volume reduction of 80% by week 12 Date Initiated: 07/01/2015 Goal Status: Active Ulcer/skin breakdown will heal within 14 weeks Date Initiated: 07/01/2015 Goal Status: Active Interventions: Assess patient/caregiver ability to perform ulcer/skin care regimen upon admission and as needed Assess ulceration(s) every visit Provide education on ulcer and skin care Treatment Activities: Skin care regimen initiated : 07/10/2015 Notes:  Electronic Signature(s) Signed: 07/10/2015 10:20:56 AM By: Elpidio Eric BSN, RN Entered By: Elpidio Eric on 07/10/2015 10:20:56 Fonder, [name] ([id number], [name] ([id number]) -------------------------------------------------------------------------------- Pain Assessment Details Patient Name: Ernest, [name]. Date of Service: 07/10/2015 10:15 AM Medical Record Number: [id number] Patient Account Number: [id number] Date of Birth/Sex: [birth year] (64 y.o. Male) Treating RN: Clover Mealy, RN, BSN, [place] Sink Primary Care Physician: Sherrie Mustache Other Clinician: Referring Physician: Sherrie Mustache Treating Physician/Extender: Rudene Re in Treatment: 1 Active Problems Location of Pain Severity and Description of Pain Patient Has Paino No Site Locations Pain Management and Medication Current Pain Management: Electronic Signature(s) Signed: 07/10/2015 10:10:03 AM By: Elpidio Eric BSN, RN Entered By: Elpidio Eric on 07/10/2015 10:10:03 [place], [name] ([id number]) -------------------------------------------------------------------------------- Patient/Caregiver Education Details Patient Name: Ernest, [name]. Date of Service: 07/10/2015 10:15 AM Medical Record Number: [id number] Patient Account Number: [id number] Date of Birth/Gender: 02/11/1951 (64 y.o. Male) Treating RN: Clover Mealy, RN, BSN, [place] Sink Primary Care Physician: Sherrie Mustache Other Clinician: Referring Physician: Sherrie Mustache Treating Physician/Extender: Rudene Re in Treatment: 1 Education Assessment Education Provided To: Patient Education Topics Provided Venous: Methods: Explain/Verbal Responses: State content correctly Welcome To The Wound Care Center: Methods: Explain/Verbal Responses: State content correctly Wound/Skin Impairment: Methods: Explain/Verbal Responses: State content correctly Electronic Signature(s) Signed: 07/10/2015 10:39:58 AM By: Elpidio Eric BSN, RN Entered By: Elpidio Eric on 07/10/2015 10:39:58 [place], [name] ([id number]) -------------------------------------------------------------------------------- Wound Assessment Details Patient Name: Ernest, WEATHERLY R. Date of Service: 07/10/2015 10:15 AM Medical Record Number: [id number] Patient Account Number: [id number] Date of Birth/Sex: 1951-07-01  (64 y.o. Male) Treating RN: Clover Mealy, RN, BSN,  Sink Primary Care Physician: Sherrie Mustache Other Clinician: Referring Physician: Sherrie Mustache Treating Physician/Extender: Rudene Re in Treatment: 1 Wound Status Wound Number: 2 Primary Lymphedema Etiology: Wound Location: Left Lower Leg - Lateral Wound Open Wounding Event: Gradually Appeared Status: Date Acquired: 07/10/2015 Comorbid Chronic sinus problems/congestion, Weeks Of Treatment: 0 History: Chronic Obstructive Pulmonary Disease Clustered Wound: No (COPD), Sleep Apnea, Congestive Heart Failure, Hypertension, Myocardial Infarction, Type II Diabetes, Osteoarthritis, Confinement Anxiety Photos Photo Uploaded By: Elpidio Eric on 07/10/2015 17:20:49 Wound Measurements Length: (cm) 1.5 Width: (cm) 2 Depth: (cm) 0.1 Area: (cm) 2.356 Volume: (cm) 0.236 % Reduction in Area: 0% % Reduction in Volume: 0% Epithelialization: None Tunneling: No Undermining: No Wound Description Full Thickness Without Exposed Foul Odor Af Classification: Support Structures Diabetic Severity Grade 0 (Wagner): Wound Margin: Distinct, outline attached Exudate Amount: Medium Exudate Type: Serosanguineous Exudate Color: red, brown [place], Ernest R. ([id number]) ter Cleansing: No Wound Bed Granulation Amount: Small (1-33%) Exposed Structure Granulation Quality: Pink, Pale Fascia Exposed: No Necrotic Amount: Medium (34-66%) Fat Layer Exposed: No Necrotic Quality: Adherent Slough Tendon Exposed: No Muscle Exposed: No Joint Exposed: No Bone Exposed: No Limited to Skin Breakdown Periwound Skin Texture Texture Color No Abnormalities Noted: No No Abnormalities Noted: No Callus: No Atrophie Blanche: No Crepitus: No Cyanosis: No Excoriation: No Ecchymosis: No Fluctuance: No Erythema: No Friable: No Hemosiderin Staining: No Induration: No Mottled: Yes Localized Edema: Yes Pallor: No Rash: No Rubor: No Scarring: No  Temperature / Pain Moisture Temperature: No Abnormality No Abnormalities Noted: No Dry / Scaly: No Maceration: No Moist: Yes Wound Preparation Ulcer Cleansing: Other: water and soap, Topical Anesthetic Applied: Other: lidocaine 4%, Treatment Notes Wound #2 (Left, Lateral Lower Leg) 1. Cleansed with: Cleanse wound with antibacterial soap and water 3. Peri-wound Care: Moisturizing lotion 4. Dressing Applied: Aquacel Ag 5. Secondary Dressing Applied ABD Pad 7. Secured with [employer] to  Left Lower Extremity Electronic Signature(s) Ernest, [name] ([id number]) Signed: 07/10/2015 10:17:35 AM By: Elpidio Eric BSN, RN Entered By: Elpidio Eric on 07/10/2015 10:17:34 Ernest, [name] ([id number]) -------------------------------------------------------------------------------- Vitals Details Patient Name: KEANAN, MELANDER. Date of Service: 07/10/2015 10:15 AM Medical Record Number: [id number] Patient Account Number: [id number] Date of Birth/Sex: Jan 12, 1951 (64 y.o. Male) Treating RN: Clover Mealy, RN, BSN, [place] Sink Primary Care Physician: Sherrie Mustache Other Clinician: Referring Physician: Sherrie Mustache Treating Physician/Extender: Rudene Re in Treatment: 1 Vital Signs Time Taken: 10:12 Temperature (F): 97.8 Pulse (bpm): 78 Respiratory Rate (breaths/min): 18 Blood Pressure (mmHg): 153/80 Reference Range: 80 - 120 mg / dl Electronic Signature(s) Signed: 07/10/2015 10:13:05 AM By: Elpidio Eric BSN, RN Entered By: Elpidio Eric on 07/10/2015 10:13:05

## 2015-07-11 NOTE — Progress Notes (Signed)
GROVE, DEFINA ([id number]) Visit Report for 07/10/2015 Chief Complaint Document Details Patient Name: [name], [name]. Date of Service: 07/10/2015 10:15 AM Medical Record Number: [id number] Patient Account Number: [id number] Date of Birth/Sex: January 25, 1951 (64 y.o. Male) Treating RN: Clover Mealy, RN, BSN, [place] Sink Primary Care Physician: Sherrie Mustache Other Clinician: Referring Physician: Sherrie Mustache Treating Physician/Extender: Rudene Re in Treatment: 1 Information Obtained from: Patient Chief Complaint Patient returns to the wound care center for reopened ulcer to: the left lower extremity with swelling. Electronic Signature(s) Signed: 07/10/2015 10:29:23 AM By: Evlyn Kanner MD, FACS Entered By: Evlyn Kanner on 07/10/2015 10:29:22 [name], [name] ([id number]) -------------------------------------------------------------------------------- HPI Details Patient Name: [name], [name]. Date of Service: 07/10/2015 10:15 AM Medical Record Number: [id number] Patient Account Number: [id number] Date of Birth/Sex: 1951-03-29 (64 y.o. Male) Treating RN: Clover Mealy, RN, BSN, [place] Sink Primary Care Physician: Sherrie Mustache Other Clinician: Referring Physician: Sherrie Mustache Treating Physician/Extender: Rudene Re in Treatment: 1 History of Present Illness Location: left calf Quality: Patient reports experiencing a dull pain to affected area(s). Severity: Patient states wound are getting worse. Duration: Patient has had the wound for > 3 months prior to seeking treatment at the wound center Timing: Pain in wound is Intermittent (comes and goes Context: The wound appeared gradually over time Modifying Factors: Other treatment(s) tried include: antibiotic which she is not sure about Associated Signs and Symptoms: Patient reports having difficulty standing for long periods. HPI Description: this 64 year old gentleman comes to see [place] for bilateral swelling of the lower extremities with weeping  of an ulcer on the left lower extremity. Other comorbidities include morbid obesity, hyperlipidemia, diabetes mellitus type 2, COPD, hypertension, congestive heart failure and prostate problems. He was here in February of this year and we had asked him to get vascular studies and he does say that he has had them done but does not know the results. He wears his compression stockings on and off but recently has not been wearing them because they get too tight. 07/10/2015 -- for some reason the patient has not seen Dr. Wyn Quaker and we are still awaiting his vascular workup. In the meanwhile his edema continues to be significant and now he has an open wound on the left lateral calf. Electronic Signature(s) Signed: 07/10/2015 10:30:05 AM By: Evlyn Kanner MD, FACS Entered By: Evlyn Kanner on 07/10/2015 10:30:05 [name], [name] ([id number]) -------------------------------------------------------------------------------- Physical Exam Details Patient Name: [name], Ernest R. Date of Service: 07/10/2015 10:15 AM Medical Record Number: [id number] Patient Account Number: [id number] Date of Birth/Sex: 12-08-1950 (64 y.o. Male) Treating RN: Clover Mealy, RN, BSN, [place] Sink Primary Care Physician: Sherrie Mustache Other Clinician: Referring Physician: Sherrie Mustache Treating Physician/Extender: Rudene Re in Treatment: 1 Constitutional . Pulse regular. Respirations normal and unlabored. Afebrile. . Eyes Nonicteric. Reactive to light. Ears, Nose, Mouth, and Throat Lips, teeth, and gums WNL.Marland Kitchen Moist mucosa without lesions . Neck supple and nontender. No palpable supraclavicular or cervical adenopathy. Normal sized without goiter. Respiratory WNL. No retractions.. Cardiovascular Pedal Pulses WNL. he has +2 pitting edema both lower extremities.. Lymphatic No adneopathy. No adenopathy. No adenopathy. Musculoskeletal Adexa without tenderness or enlargement.. Digits and nails w/o clubbing, cyanosis, infection,  petechiae, ischemia, or inflammatory conditions.. Integumentary (Hair, Skin) No suspicious lesions. No crepitus or fluctuance. No peri-wound warmth or erythema. No masses.Marland Kitchen Psychiatric Judgement and insight Intact.. No evidence of depression, anxiety, or agitation.. Notes He has a superficial ulceration on the left lower extremity which is non-open wound with subcutaneous in his tissue visible. He also continues  to have stage II lymphedema. Electronic Signature(s) Signed: 07/10/2015 10:31:03 AM By: Evlyn Kanner MD, FACS Entered By: Evlyn Kanner on 07/10/2015 10:31:03 [name], Ernest ([id number]) -------------------------------------------------------------------------------- Physician Orders Details Patient Name: [name], [name]. Date of Service: 07/10/2015 10:15 AM Medical Record Number: [id number] Patient Account Number: [id number] Date of Birth/Sex: 12/05/1950 (64 y.o. Male) Treating RN: Clover Mealy, RN, BSN, [place] Sink Primary Care Physician: Sherrie Mustache Other Clinician: Referring Physician: Sherrie Mustache Treating Physician/Extender: Rudene Re in Treatment: 1 Verbal / Phone Orders: Yes Clinician: Afful, RN, BSN, Rita Read Back and Verified: Yes Diagnosis Coding Wound Cleansing Wound #2 Left,Lateral Lower Leg o Cleanse wound with mild soap and water Skin Barriers/Peri-Wound Care Wound #2 Left,Lateral Lower Leg o Moisturizing lotion Primary Wound Dressing Wound #2 Left,Lateral Lower Leg o Aquacel Ag Secondary Dressing Wound #2 Left,Lateral Lower Leg o ABD pad Dressing Change Frequency Wound #2 Left,Lateral Lower Leg o Change dressing every week Follow-up Appointments Wound #2 Left,Lateral Lower Leg o Return Appointment in 1 week. Edema Control Wound #2 Left,Lateral Lower Leg o Unna Boot to Left Lower Extremity Electronic Signature(s) Signed: 07/10/2015 10:25:48 AM By: Elpidio Eric BSN, RN Signed: 07/10/2015 4:57:18 PM By: Evlyn Kanner MD, FACS Entered  By: Elpidio Eric on 07/10/2015 10:25:48 Ernest, [name] ([name], [name] ([id number]) -------------------------------------------------------------------------------- Problem List Details Patient Name: [name], [name]. Date of Service: 07/10/2015 10:15 AM Medical Record Number: [id number] Patient Account Number: [id number] Date of Birth/Sex: 1951-10-28 (64 y.o. 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 I87.332 Chronic venous hypertension (idiopathic) with ulcer and 07/01/2015 Yes inflammation of left lower extremity I50.22 Chronic systolic (congestive) heart failure 07/01/2015 Yes I89.0 Lymphedema, not elsewhere classified 07/01/2015 Yes L97.222 Non-pressure chronic ulcer of left calf with fat layer 07/10/2015 Yes exposed Inactive Problems Resolved Problems Electronic Signature(s) Signed: 07/10/2015 10:29:13 AM By: Evlyn Kanner MD, FACS Previous Signature: 07/10/2015 10:28:12 AM Version By: Evlyn Kanner MD, FACS Entered By: Evlyn Kanner on 07/10/2015 10:29:12 TAKARI, LUNDAHL ([id number]) Dever, [name] ([id number]) -------------------------------------------------------------------------------- Progress Note Details Patient Name: [name], Ernest R. Date of Service: 07/10/2015 10:15 AM Medical Record Number: [id number] Patient Account Number: [id number] Date of Birth/Sex: 1951/05/13 (64 y.o. Male) Treating RN: Clover Mealy, RN, BSN, [place] Sink Primary Care Physician: Sherrie Mustache Other  Clinician: Referring Physician: Sherrie Mustache Treating Physician/Extender: Rudene Re in Treatment: 1 Subjective Chief Complaint Information obtained from Patient Patient returns to the wound care center for reopened ulcer to: the left lower extremity with swelling. History of Present Illness (HPI) The following HPI elements were documented for the patient's wound: Location: left calf Quality: Patient reports experiencing a dull pain to affected area(s). Severity: Patient states wound are getting worse. Duration: Patient has had the wound for > 3 months prior to seeking treatment at the wound center Timing: Pain in wound is Intermittent (comes and goes Context: The wound appeared gradually over time Modifying Factors: Other treatment(s) tried include: antibiotic which she is not sure about Associated Signs and Symptoms: Patient reports having difficulty standing for long periods. this 64 year old gentleman comes to see [place] for bilateral swelling of the lower extremities with weeping of an  ulcer on the left lower extremity. Other comorbidities include morbid obesity, hyperlipidemia, diabetes mellitus type 2, COPD, hypertension, congestive heart failure and prostate problems. He was here in February of this year and we had asked him to get vascular studies and he does say that he has had them done but does not know the results. He wears his compression stockings on and off but recently has not been wearing them because they get too tight. 07/10/2015 -- for some reason the patient has not seen Dr. Wyn Quaker and we are still awaiting his vascular workup. In the meanwhile his edema continues to be significant and now he has an open wound on the left lateral calf. Objective Constitutional Pulse regular. Respirations normal and unlabored. Afebrile. [name], [name] ([id number]) Vitals Time Taken: 10:12 AM, Temperature: 97.8 F, Pulse: 78 bpm, Respiratory Rate: 18 breaths/min, Blood  Pressure: 153/80 mmHg. Eyes Nonicteric. Reactive to light. Ears, Nose, Mouth, and Throat Lips, teeth, and gums WNL.Marland Kitchen Moist mucosa without lesions . Neck supple and nontender. No palpable supraclavicular or cervical adenopathy. Normal sized without goiter. Respiratory WNL. No retractions.. Cardiovascular Pedal Pulses WNL. he has +2 pitting edema both lower extremities.. Lymphatic No adneopathy. No adenopathy. No adenopathy. Musculoskeletal Adexa without tenderness or enlargement.. Digits and nails w/o clubbing, cyanosis, infection, petechiae, ischemia, or inflammatory conditions.Marland Kitchen Psychiatric Judgement and insight Intact.. No evidence of depression, anxiety, or agitation.. General Notes: He has a superficial ulceration on the left lower extremity which is non-open wound with subcutaneous in his tissue visible. He also continues to have stage II lymphedema. Integumentary (Hair, Skin) No suspicious lesions. No crepitus or fluctuance. No peri-wound warmth or erythema. No masses.. Wound #2 status is Open. Original cause of wound was Gradually Appeared. The wound is located on the Left,Lateral Lower Leg. The wound measures 1.5cm length x 2cm width x 0.1cm depth; 2.356cm^2 area and 0.236cm^3 volume. The wound is limited to skin breakdown. There is no tunneling or undermining noted. There is a medium amount of serosanguineous drainage noted. The wound margin is distinct with the outline attached to the wound base. There is small (1-33%) pink, pale granulation within the wound bed. There is a medium (34-66%) amount of necrotic tissue within the wound bed including Adherent Slough. The periwound skin appearance exhibited: Localized Edema, Moist, Mottled. The periwound skin appearance did not exhibit: Callus, Crepitus, Excoriation, Fluctuance, Friable, Induration, Rash, Scarring, Dry/Scaly, Maceration, Atrophie Blanche, Cyanosis, Ecchymosis, Hemosiderin Staining, Pallor, Rubor, Erythema.  Periwound temperature was noted as No Abnormality. 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 We have told him to wear his compression stockings on the right lower extremity and he says he will do this. I have urged him to continue to work with the vascular office and reschedule his appointment with Dr. Wyn Quaker. He will see me back next week. Plan Wound Cleansing: Wound #2 Left,Lateral Lower Leg: Cleanse wound with mild soap and water Skin Barriers/Peri-Wound Care: Wound #2 Left,Lateral Lower Leg: Moisturizing lotion Primary Wound Dressing: Wound #2  Left,Lateral Lower Leg: Aquacel Ag Secondary Dressing: Wound #2 Left,Lateral Lower Leg: ABD pad Dressing Change Frequency: Wound #2 Left,Lateral Lower Leg: Change dressing every week Follow-up Appointments: Wound #2 Left,Lateral Lower Leg: Return Appointment in 1 week. Edema Control: Wound #2 Left,Lateral Lower Leg: Unna Boot to Left Lower Extremity [name], Ernest R. ([id number]) I have recommended silver alginate and an Unna's boot on the left side. We have told him to wear his compression stockings on the right lower extremity and he says he will do this. I have urged him to continue to work with the vascular office and reschedule his appointment with Dr. Wyn Quaker. He will see me back next week. Electronic Signature(s) Signed: 07/10/2015 10:31:53 AM By:  Evlyn Kanner MD, FACS Entered By: Evlyn Kanner on 07/10/2015 10:31:53 [name], [name] ([id number]) -------------------------------------------------------------------------------- SuperBill Details Patient Name: [name], [name]. Date of Service: 07/10/2015 Medical Record Number: [id number] Patient Account Number: [id number] Date of Birth/Sex: 05-25-1951 (64 y.o. Male) Treating RN: Clover Mealy, RN, BSN, Rita Primary Care Physician: Sherrie Mustache Other Clinician: Referring Physician: Sherrie Mustache Treating Physician/Extender: Rudene Re in Treatment: 1 Diagnosis Coding ICD-10 Codes Code Description [phone] Type 2 diabetes mellitus with other skin ulcer E08.59 Diabetes mellitus due to underlying condition with other circulatory complications L97.221 Non-pressure chronic ulcer of left calf limited to breakdown of skin Chronic venous hypertension (idiopathic) with ulcer and inflammation of left lower I87.332 extremity I50.22 Chronic systolic (congestive) heart failure I89.0 Lymphedema, not elsewhere classified L97.222 Non-pressure chronic ulcer of left calf with fat layer exposed Facility Procedures CPT4 Code: 46962952 Description: (Facility Use Only) 29580LT - APPLY UNNA BOOT LT Modifier: Quantity: 1 Physician Procedures CPT4 Code: 8413244 Description: 99213 - WC PHYS LEVEL 3 - EST PT ICD-10 Description Diagnosis E11.622 Type 2 diabetes mellitus with other skin ulcer I89.0 Lymphedema, not elsewhere classified L97.222 Non-pressure chronic ulcer of left calf with fat l Modifier: ayer exposed Quantity: 1 Electronic Signature(s) Signed: 07/10/2015 10:44:24 AM By: Elpidio Eric BSN, RN Signed: 07/10/2015 4:57:18 PM By: Evlyn Kanner MD, FACS Previous Signature: 07/10/2015 10:32:12 AM Version By: Evlyn Kanner MD, FACS Entered By: Elpidio Eric on 07/10/2015 10:44:24

## 2015-07-13 DIAGNOSIS — I509 Heart failure, unspecified: Secondary | ICD-10-CM | POA: Diagnosis not present

## 2015-07-17 ENCOUNTER — Encounter: Payer: Medicare Other | Admitting: Surgery

## 2015-07-17 DIAGNOSIS — I87332 Chronic venous hypertension (idiopathic) with ulcer and inflammation of left lower extremity: Secondary | ICD-10-CM | POA: Diagnosis not present

## 2015-07-17 DIAGNOSIS — J449 Chronic obstructive pulmonary disease, unspecified: Secondary | ICD-10-CM | POA: Diagnosis not present

## 2015-07-17 DIAGNOSIS — L97222 Non-pressure chronic ulcer of left calf with fat layer exposed: Secondary | ICD-10-CM | POA: Diagnosis not present

## 2015-07-17 DIAGNOSIS — E11622 Type 2 diabetes mellitus with other skin ulcer: Secondary | ICD-10-CM | POA: Diagnosis not present

## 2015-07-17 DIAGNOSIS — I10 Essential (primary) hypertension: Secondary | ICD-10-CM | POA: Diagnosis not present

## 2015-07-17 DIAGNOSIS — Z09 Encounter for follow-up examination after completed treatment for conditions other than malignant neoplasm: Secondary | ICD-10-CM | POA: Diagnosis not present

## 2015-07-17 DIAGNOSIS — I890 Lymphedema, not elsewhere classified: Secondary | ICD-10-CM | POA: Diagnosis not present

## 2015-07-17 DIAGNOSIS — I5022 Chronic systolic (congestive) heart failure: Secondary | ICD-10-CM | POA: Diagnosis not present

## 2015-07-18 NOTE — Progress Notes (Signed)
Ernest, [name] ([id number]) Visit Report for 07/17/2015 Arrival Information Details Patient Name: [name], [name]. Date of Service: 07/17/2015 10:00 AM Medical Record Number: [id number] Patient Account Number: [id number] Date of Birth/Sex: 05/15/1951 (64 y.o. Male) Treating RN: Huel Coventry Primary Care Physician: Sherrie Mustache Other Clinician: Referring Physician: Sherrie Mustache Treating Physician/Extender: Rudene Re in Treatment: 2 Visit Information History Since Last Visit Added or deleted any medications: No Patient Arrived: Wheel Chair Any new allergies or adverse reactions: No Arrival Time: 10:04 Had a fall or experienced change in No Accompanied By: self activities of daily living that may affect Transfer Assistance: None risk of falls: Patient Identification Verified: Yes Signs or symptoms of abuse/neglect since last No Secondary Verification Process Yes visito Completed: Hospitalized since last visit: No Patient Requires Transmission- No Has Dressing in Place as Prescribed: Yes Based Precautions: Has Compression in Place as Prescribed: Yes Patient Has Alerts: Yes Pain Present Now: No Patient Alerts: ABI L:0.99, R:1.25 Electronic Signature(s) Signed: 07/17/2015 5:03:43 PM By: Elliot Gurney, RN, BSN, Kim RN, BSN Entered By: Elliot Gurney, RN, BSN, Kim on 07/17/2015 10:04:26 Goldbach, [name] ([id number]) -------------------------------------------------------------------------------- Clinic Level of Care Assessment Details Patient Name: Ernest, [name]. Date of Service: 07/17/2015 10:00 AM Medical Record Number: [id number] Patient Account Number: [id number] Date of Birth/Sex: Sep 08, 1951 (64 y.o. Male) Treating RN: Huel Coventry Primary Care Physician: Sherrie Mustache Other Clinician: Referring Physician: Sherrie Mustache Treating Physician/Extender: Rudene Re in Treatment: 2 Clinic Level of Care Assessment Items TOOL 4 Quantity Score []  - Use when only an EandM is  performed on FOLLOW-UP visit 0 ASSESSMENTS - Nursing Assessment / Reassessment []  - Reassessment of Co-morbidities (includes updates in patient status) 0 []  - Reassessment of Adherence to Treatment Plan 0 ASSESSMENTS - Wound and Skin Assessment / Reassessment X - Simple Wound Assessment / Reassessment - one wound 1 5 []  - Complex Wound Assessment / Reassessment - multiple wounds 0 []  - Dermatologic / Skin Assessment (not related to wound area) 0 ASSESSMENTS - Focused Assessment []  - Circumferential Edema Measurements - multi extremities 0 []  - Nutritional Assessment / Counseling / Intervention 0 []  - Lower Extremity Assessment (monofilament, tuning fork, pulses) 0 []  - Peripheral Arterial Disease Assessment (using hand held doppler) 0 ASSESSMENTS - Ostomy and/or Continence Assessment and Care []  - Incontinence Assessment and Management 0 []  - Ostomy Care Assessment and Management (repouching, etc.) 0 PROCESS - Coordination of Care X - Simple Patient / Family Education for ongoing care 1 15 []  - Complex (extensive) Patient / Family Education for ongoing care 0 []  - Staff obtains Chiropractor, Records, Test Results / Process Orders 0 []  - Staff telephones HHA, Nursing Homes / Clarify orders / etc 0 []  - Routine Transfer to another Facility (non-emergent condition) 0 Olivier, Dawsyn R. ([id number]) []  - Routine Hospital Admission (non-emergent condition) 0 []  - New Admissions / Manufacturing engineer / Ordering NPWT, Apligraf, etc. 0 []  - Emergency Hospital Admission (emergent condition) 0 X - Simple Discharge Coordination 1 10 []  - Complex (extensive) Discharge Coordination 0 PROCESS - Special Needs []  - Pediatric / Minor Patient Management 0 []  - Isolation Patient Management 0 []  - Hearing / Language / Visual special needs 0 []  - Assessment of Community assistance (transportation, D/C planning, etc.) 0 []  - Additional assistance / Altered mentation 0 []  - Support Surface(s) Assessment  (bed, cushion, seat, etc.) 0 INTERVENTIONS - Wound Cleansing / Measurement []  - Simple Wound Cleansing - one wound 0 []  - Complex Wound Cleansing - multiple  wounds 0  - Wound Imaging (photographs - any number of wounds) 0  - Wound Tracing (instead of photographs) 0  - Simple Wound Measurement - one wound 0  - Complex Wound Measurement - multiple wounds 0 INTERVENTIONS - Wound Dressings  - Small Wound Dressing one or multiple wounds 0  - Medium Wound Dressing one or multiple wounds 0  - Large Wound Dressing one or multiple wounds 0  - Application of Medications - topical 0  - Application of Medications - injection 0 INTERVENTIONS - Miscellaneous  - External ear exam 0 Neyland, Kavari R. ([id number])  - Specimen Collection (cultures, biopsies, blood, body fluids, etc.) 0  - Specimen(s) / Culture(s) sent or taken to Lab for analysis 0  - Patient Transfer (multiple staff / Michiel Sites Lift / Similar devices) 0  - Simple Staple / Suture removal (25 or less) 0  - Complex Staple / Suture removal (26 or more) 0  - Hypo / Hyperglycemic Management (close monitor of Blood Glucose) 0  - Ankle / Brachial Index (ABI) - do not check if billed separately 0 X - Vital Signs 1 5 Has the patient been seen at the hospital within the last three years: Yes Total Score: 35 Level Of Care: New/Established - Level 1 Electronic Signature(s) Signed: 07/17/2015 5:03:43 PM By: Elliot Gurney, RN, BSN, Kim RN, BSN Entered By: Elliot Gurney, RN, BSN, Kim on 07/17/2015 10:21:17 Bosler, [name] ([id number]) -------------------------------------------------------------------------------- Encounter Discharge Information Details Patient Name: Ernest, [name]. Date of Service: 07/17/2015 10:00 AM Medical Record Number: [id number] Patient Account Number: [id number] Date of Birth/Sex: 1951-02-03 (64 y.o. Male) Treating RN: Huel Coventry Primary Care Physician: Sherrie Mustache Other Clinician: Referring Physician:  Sherrie Mustache Treating Physician/Extender: Rudene Re in Treatment: 2 Encounter Discharge Information Items Discharge Pain Level: 0 Discharge Condition: Stable Ambulatory Status: Wheelchair Discharge Destination: Home Transportation: Private Auto Accompanied By: self Schedule Follow-up Appointment: Yes Medication Reconciliation completed and provided to Patient/Care Yes Gram Siedlecki: Provided on Clinical Summary of Care: 07/17/2015 Form Type Recipient Paper Patient GW Electronic Signature(s) Signed: 07/17/2015 5:03:43 PM By: Elliot Gurney, RN, BSN, Kim RN, BSN Previous Signature: 07/17/2015 10:20:25 AM Version By: Gwenlyn Perking Entered By: Elliot Gurney RN, BSN, Kim on 07/17/2015 10:21:53 Simonian, [name] ([id number]) -------------------------------------------------------------------------------- Lower Extremity Assessment Details Patient Name: ALDEAN, PIPE. Date of Service: 07/17/2015 10:00 AM Medical Record Number: [id number] Patient Account Number: [id number] Date of Birth/Sex: 02/26/1951 (64 y.o. Male) Treating RN: Huel Coventry Primary Care Physician: Sherrie Mustache Other Clinician: Referring Physician: Sherrie Mustache Treating Physician/Extender: Rudene Re in Treatment: 2 Edema Assessment Assessed: [Left: No] [Right: No] E[Left: dema] [Right: :] Calf Left: Right: Point of Measurement: 38 cm From Medial Instep 55 cm cm Ankle Left: Right: Point of Measurement: 9 cm From Medial Instep 33.5 cm cm Vascular Assessment Pulses: Posterior Tibial Palpable: [Left:Yes] Dorsalis Pedis Palpable: [Left:Yes] Extremity colors, hair growth, and conditions: Extremity Color: [Left:Hyperpigmented] Hair Growth on Extremity: [Left:No] Temperature of Extremity: [Left:Warm] Capillary Refill: [Left:< 3 seconds] Toe Nail Assessment Left: Right: Thick: Yes Discolored: Yes Deformed: No Improper Length and Hygiene: No Electronic Signature(s) Signed: 07/17/2015 5:03:43 PM By: Elliot Gurney, RN,  BSN, Kim RN, BSN Entered By: Elliot Gurney, RN, BSN, Kim on 07/17/2015 10:10:33 Guidotti, WAYDE GOPAUL ([id number], HARMSEN ([id number]) -------------------------------------------------------------------------------- Multi-Disciplinary Care Plan Details Patient Name: CAYSON, KALB. Date of Service: 07/17/2015 10:00 AM Medical Record Number: [id number] Patient Account Number: [id number] Date of Birth/Sex: [birth year] (64 y.o. Male) Treating RN: Huel Coventry Primary Care Physician: Sherrie Mustache Other Clinician: Referring  Physician: Sherrie Mustache Treating Physician/Extender: Rudene Re in Treatment: 2 Active Inactive Electronic Signature(s) Signed: 07/17/2015 5:03:43 PM By: Elliot Gurney, RN, BSN, Kim RN, BSN Entered By: Elliot Gurney, RN, BSN, Kim on 07/17/2015 10:22:22 QUINTAVIOUS, RINCK ([id number]) -------------------------------------------------------------------------------- Patient/Caregiver Education Details Patient Name: DYLLEN, MENNING. Date of Service: 07/17/2015 10:00 AM Medical Record Number: [id number] Patient Account Number: [id number] Date of Birth/Gender: December 23, 1950 (64 y.o. Male) Treating RN: Huel Coventry Primary Care Physician: Sherrie Mustache Other Clinician: Referring Physician: Sherrie Mustache Treating Physician/Extender: Rudene Re in Treatment: 2 Education Assessment Education Provided To: Patient Education Topics Provided Venous: Controlling Swelling with Compression Stockings , Other: patient to wear own compression Handouts: stockings Electronic Signature(s) Signed: 07/17/2015 5:03:43 PM By: Elliot Gurney, RN, BSN, Kim RN, BSN Entered By: Elliot Gurney, RN, BSN, Kim on 07/17/2015 10:23:06 Vigo, [name] ([id number]) -------------------------------------------------------------------------------- Wound Assessment Details Patient Name: JUNO, BOZARD. Date of Service: 07/17/2015 10:00 AM Medical Record Number: [id number] Patient Account Number: [id number] Date of Birth/Sex:  02/13/1951 (64 y.o. Male) Treating RN: Huel Coventry Primary Care Physician: Sherrie Mustache Other Clinician: Referring Physician: Sherrie Mustache Treating Physician/Extender: Rudene Re in Treatment: 2 Wound Status Wound Number: 2 Primary Etiology: Lymphedema Wound Location: Left, Lateral Lower Leg Wound Status: Healed - Epithelialized Wounding Event: Gradually Appeared Date Acquired: 07/10/2015 Weeks Of Treatment: 1 Clustered Wound: No Photos Photo Uploaded By: Elliot Gurney, RN, BSN, Kim on 07/17/2015 10:33:24 Wound Measurements Length: (cm) 0 % Reducti Width: (cm) 0 % Reducti Depth: (cm) 0 Area: (cm) 0 Volume: (cm) 0 on in Area: 100% on in Volume: 100% Wound Description Full Thickness Without Exposed Classification: Support Structures Periwound Skin Texture Texture Color No Abnormalities Noted: No No Abnormalities Noted: No Moisture No Abnormalities Noted: No Electronic Signature(s) Signed: 07/17/2015 5:03:43 PM By: Elliot Gurney, RN, BSN, Kim RN, BSN [address] [place] ([id number]) Entered By: Elliot Gurney, RN, BSN, Kim on 07/17/2015 10:20:35 Parmar, [name] ([id number]) -------------------------------------------------------------------------------- Vitals Details Patient Name: PEARCE, LITTLEFIELD. Date of Service: 07/17/2015 10:00 AM Medical Record Number: [id number] Patient Account Number: [id number] Date of Birth/Sex: 1951/02/02 (64 y.o. Male) Treating RN: Huel Coventry Primary Care Physician: Sherrie Mustache Other Clinician: Referring Physician: Sherrie Mustache Treating Physician/Extender: Rudene Re in Treatment: 2 Vital Signs Time Taken: 10:04 Temperature (F): 98.0 Pulse (bpm): 69 Respiratory Rate (breaths/min): 20 Blood Pressure (mmHg): 186/95 Reference Range: 80 - 120 mg / dl Notes Patient is on 2L o2 while in clinic. Electronic Signature(s) Signed: 07/17/2015 5:03:43 PM By: Elliot Gurney, RN, BSN, Kim RN, BSN Entered By: Elliot Gurney, RN, BSN, Kim on 07/17/2015 10:05:20

## 2015-07-18 NOTE — Progress Notes (Signed)
[name] ([id number]) Visit Report for 07/17/2015 Chief Complaint Document Details Patient Name: [name], [name]. Date of Service: 07/17/2015 10:00 AM Medical Record Number: [id number] Patient Account Number: [id number] Date of Birth/Sex: Dec 23, 1950 (64 y.o. Male) Treating RN: Curtis Sites Primary Care Physician: Sherrie Mustache Other Clinician: Referring Physician: Sherrie Mustache Treating Physician/Extender: Rudene Re in Treatment: 2 Information Obtained from: Patient Chief Complaint Patient returns to the wound care center for reopened ulcer to: the left lower extremity with swelling. Electronic Signature(s) Signed: 07/17/2015 10:35:02 AM By: Evlyn Kanner MD, FACS Entered By: Evlyn Kanner on 07/17/2015 10:35:01 BRODRIC, SCHAUER ([id number]) -------------------------------------------------------------------------------- HPI Details Patient Name: [name]. Date of Service: 07/17/2015 10:00 AM Medical Record Number: [id number] Patient Account Number: [id number] Date of Birth/Sex: 09-Jan-1951 (64 y.o. Male) Treating RN: Curtis Sites Primary Care Physician: Sherrie Mustache Other Clinician: Referring Physician: Sherrie Mustache Treating Physician/Extender: Rudene Re in Treatment: 2 History of Present Illness Location: left calf Quality: Patient reports experiencing a dull pain to affected area(s). Severity: Patient states wound are getting worse. Duration: Patient has had the wound for > 3 months prior to seeking treatment at the wound center Timing: Pain in wound is Intermittent (comes and goes Context: The wound appeared gradually over time Modifying Factors: Other treatment(s) tried include: antibiotic which she is not sure about Associated Signs and Symptoms: Patient reports having difficulty standing for long periods. HPI Description: this 64 year old gentleman comes to see [place] for bilateral swelling of the lower extremities with weeping of an ulcer  on the left lower extremity. Other comorbidities include morbid obesity, hyperlipidemia, diabetes mellitus type 2, COPD, hypertension, congestive heart failure and prostate problems. He was here in February of this year and we had asked him to get vascular studies and he does say that he has had them done but does not know the results. He wears his compression stockings on and off but recently has not been wearing them because they get too tight. 07/10/2015 -- for some reason the patient has not seen Dr. Wyn Quaker and we are still awaiting his vascular workup. In the meanwhile his edema continues to be significant and now he has an open wound on the left lateral calf. 07/17/2015 -- he has his vascular appointment on Thursday and Friday of next week and is scheduled for this. He also has an appointment to go and get compression stockings. Electronic Signature(s) Signed: 07/17/2015 10:35:30 AM By: Evlyn Kanner MD, FACS Entered By: Evlyn Kanner on 07/17/2015 10:35:30 BODEN, STUCKY ([id number]) -------------------------------------------------------------------------------- Physical Exam Details Patient Name: [name], [name] Date of Service: 07/17/2015 10:00 AM Medical Record Number: [id number] Patient Account Number: [id number] Date of Birth/Sex: 1951-05-28 (64 y.o. Male) Treating RN: Curtis Sites Primary Care Physician: Sherrie Mustache Other Clinician: Referring Physician: Sherrie Mustache Treating Physician/Extender: Rudene Re in Treatment: 2 Constitutional . Pulse regular. Respirations normal and unlabored. Afebrile. . Eyes Nonicteric. Reactive to light. Ears, Nose, Mouth, and Throat Lips, teeth, and gums WNL.Marland Kitchen Moist mucosa without lesions . Neck supple and nontender. No palpable supraclavicular or cervical adenopathy. Normal sized without goiter. Respiratory WNL. No retractions.. Breath sounds WNL, No rubs, rales, rhonchi, or wheeze.. Cardiovascular Heart rhythm and rate  regular, no murmur or gallop.. Pedal Pulses WNL. No clubbing, cyanosis or edema. Chest Breasts symmetical and no nipple discharge.. Breast tissue WNL, no masses, lumps, or tenderness.. Lymphatic No adneopathy. No adenopathy. No adenopathy. Musculoskeletal Adexa without tenderness or enlargement.. Digits and nails w/o clubbing, cyanosis, infection, petechiae, ischemia, or inflammatory  conditions.. Integumentary (Hair, Skin) No suspicious lesions. No crepitus or fluctuance. No peri-wound warmth or erythema. No masses.Marland Kitchen Psychiatric Judgement and insight Intact.. No evidence of depression, anxiety, or agitation.. Notes He has lymphedema but no open ulcerations both lower extremities. Electronic Signature(s) Signed: 07/17/2015 10:35:53 AM By: Evlyn Kanner MD, FACS Entered By: Evlyn Kanner on 07/17/2015 10:35:52 ELI, PATTILLO ([id number]) -------------------------------------------------------------------------------- Physician Orders Details Patient Name: [name]. Date of Service: 07/17/2015 10:00 AM Medical Record Number: [id number] Patient Account Number: [id number] Date of Birth/Sex: 1951/01/11 (64 y.o. Male) Treating RN: Huel Coventry Primary Care Physician: Sherrie Mustache Other Clinician: Referring Physician: Sherrie Mustache Treating Physician/Extender: Rudene Re in Treatment: 2 Verbal / Phone Orders: Yes Clinician: Huel Coventry Read Back and Verified: Yes Diagnosis Coding Discharge From [facility] o Discharge from Wound Care Center Electronic Signature(s) Signed: 07/17/2015 4:23:40 PM By: Evlyn Kanner MD, FACS Signed: 07/17/2015 5:03:43 PM By: Elliot Gurney RN, BSN, Kim RN, BSN Entered By: Elliot Gurney, RN, BSN, Kim on 07/17/2015 10:20:57 Scantling, [name] ([id number]) -------------------------------------------------------------------------------- Problem List Details Patient Name: [name]. Date of Service: 07/17/2015 10:00 AM Medical Record Number: [id number] Patient  Account Number: [id number] Date of Birth/Sex: 1950/12/15 (64 y.o. 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 07/01/2015 Yes inflammation of left lower extremity I50.22 Chronic systolic (congestive) heart failure 07/01/2015 Yes I89.0 Lymphedema, not elsewhere classified 07/01/2015 Yes L97.222 Non-pressure chronic ulcer of left calf with fat layer 07/10/2015 Yes exposed Inactive Problems Resolved Problems Electronic Signature(s) Signed: 07/17/2015 10:34:52 AM By: Evlyn Kanner MD, FACS Entered By: Evlyn Kanner on 07/17/2015 10:34:52 Crysler, CAID RADIN ([id number]) Nolting, [name] ([id number]) -------------------------------------------------------------------------------- Progress Note Details Patient Name: [name]. Date of Service: 07/17/2015 10:00 AM Medical Record Number: [id number] Patient Account Number: [id number] Date of Birth/Sex: 1951/03/16 (64 y.o. Male) Treating RN: Curtis Sites Primary Care Physician: Sherrie Mustache Other Clinician: Referring Physician: Sherrie Mustache Treating Physician/Extender: Rudene Re in Treatment: 2 Subjective Chief Complaint Information obtained from Patient Patient returns to the wound care center for reopened ulcer to: the left lower extremity with swelling. History of Present Illness (HPI) The following HPI elements were documented for the patient's wound: Location: left calf Quality:  Patient reports experiencing a dull pain to affected area(s). Severity: Patient states wound are getting worse. Duration: Patient has had the wound for > 3 months prior to seeking treatment at the wound center Timing: Pain in wound is Intermittent (comes and goes Context: The wound appeared gradually over time Modifying Factors: Other treatment(s) tried include: antibiotic which she is not sure about Associated Signs and Symptoms: Patient reports having difficulty standing for long periods. this 64 year old gentleman comes to see [place] for bilateral swelling of the lower extremities with weeping of an ulcer on the left lower extremity. Other comorbidities include morbid obesity, hyperlipidemia, diabetes mellitus type 2, COPD, hypertension, congestive heart failure and prostate problems. He was here in February of this year and we had asked him to get vascular studies and he does say that he has had them done but does not know the results. He wears his  compression stockings on and off but recently has not been wearing them because they get too tight. 07/10/2015 -- for some reason the patient has not seen Dr. Wyn Quaker and we are still awaiting his vascular workup. In the meanwhile his edema continues to be significant and now he has an open wound on the left lateral calf. 07/17/2015 -- he has his vascular appointment on Thursday and Friday of next week and is scheduled for this. He also has an appointment to go and get compression stockings. Objective Constitutional Kushner, Alexio R. ([id number]) Pulse regular. Respirations normal and unlabored. Afebrile. Vitals Time Taken: 10:04 AM, Temperature: 98.0 F, Pulse: 69 bpm, Respiratory Rate: 20 breaths/min, Blood Pressure: 186/95 mmHg. General Notes: Patient is on 2L o2 while in clinic. Eyes Nonicteric. Reactive to light. Ears, Nose, Mouth, and Throat Lips, teeth, and gums WNL.Marland Kitchen Moist mucosa without lesions . Neck supple and nontender. No palpable  supraclavicular or cervical adenopathy. Normal sized without goiter. Respiratory WNL. No retractions.. Breath sounds WNL, No rubs, rales, rhonchi, or wheeze.. Cardiovascular Heart rhythm and rate regular, no murmur or gallop.. Pedal Pulses WNL. No clubbing, cyanosis or edema. Chest Breasts symmetical and no nipple discharge.. Breast tissue WNL, no masses, lumps, or tenderness.. Lymphatic No adneopathy. No adenopathy. No adenopathy. Musculoskeletal Adexa without tenderness or enlargement.. Digits and nails w/o clubbing, cyanosis, infection, petechiae, ischemia, or inflammatory conditions.Marland Kitchen Psychiatric Judgement and insight Intact.. No evidence of depression, anxiety, or agitation.. General Notes: He has lymphedema but no open ulcerations both lower extremities. Integumentary (Hair, Skin) No suspicious lesions. No crepitus or fluctuance. No peri-wound warmth or erythema. No masses.. Wound #2 status is Healed - Epithelialized. Original cause of wound was Gradually Appeared. The wound is located on the Left,Lateral Lower Leg. The wound measures 0cm length x 0cm width x 0cm depth; 0cm^2 area and 0cm^3 volume. 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 I have also asked him to wear compression stockings of the 20-30 mm variety all day from morning to night. He says he would be compliant and come  back and see as as required. He is discharged from the wound care services. Plan Discharge From [facility] Services: Discharge from Wound Care Center I have recommended he keeps his appointment with the vascular surgeons next week. I have also asked him to wear compression stockings of the 20-30 mm variety all day from morning to night. He says he would be compliant and come back and see as as required. He is discharged from the wound care services. Electronic Signature(s) Signed: 07/17/2015 10:36:46 AM By: Evlyn Kanner MD, FACS Entered By: Evlyn Kanner on 07/17/2015 10:36:46 Ryant, [name] ([id number]) -------------------------------------------------------------------------------- SuperBill Details Patient Name: VELDON, WAGER. Date of Service: 07/17/2015 Medical Record Number: [id number] Patient Account Number: [id number] Date of Birth/Sex: Jan 18, 1951 (64 y.o. Male) Treating RN: Curtis Sites Primary Care Physician: Sherrie Mustache Other Clinician: Referring Physician: Sherrie Mustache Treating Physician/Extender: Rudene Re in Treatment: 2 Diagnosis Coding ICD-10 Codes Code Description [phone] Type 2  diabetes mellitus with other skin ulcer E08.59 Diabetes mellitus due to underlying condition with other circulatory complications L97.221 Non-pressure chronic ulcer of left calf limited to breakdown of skin Chronic venous hypertension (idiopathic) with ulcer and inflammation of left lower I87.332 extremity I50.22 Chronic systolic (congestive) heart failure I89.0 Lymphedema, not elsewhere classified L97.222 Non-pressure chronic ulcer of left calf with fat layer exposed Facility Procedures CPT4 Code: [id number] Description: [phone] - WOUND CARE VISIT-LEV 1 EST PT Modifier: Quantity: 1 Physician Procedures CPT4: Description Modifier Quantity Code [id number] - WC PHYS LEVEL 3 - EST PT 1 ICD-10 Description Diagnosis E11.622 Type 2 diabetes mellitus with other skin ulcer E08.59  Diabetes mellitus due to underlying condition with other circulatory  complications I89.0 Lymphedema, not elsewhere classified L97.221 Non-pressure chronic ulcer of left calf limited to breakdown of skin Electronic Signature(s) Signed: 07/17/2015 10:37:02 AM By: Evlyn Kanner MD, FACS Entered By: Evlyn Kanner on 07/17/2015 10:37:02

## 2015-07-24 DIAGNOSIS — M7989 Other specified soft tissue disorders: Secondary | ICD-10-CM | POA: Diagnosis not present

## 2015-07-25 DIAGNOSIS — M7989 Other specified soft tissue disorders: Secondary | ICD-10-CM | POA: Diagnosis not present

## 2015-07-25 DIAGNOSIS — I8000 Phlebitis and thrombophlebitis of superficial vessels of unspecified lower extremity: Secondary | ICD-10-CM | POA: Diagnosis not present

## 2015-07-25 DIAGNOSIS — M79609 Pain in unspecified limb: Secondary | ICD-10-CM | POA: Diagnosis not present

## 2015-07-25 DIAGNOSIS — I509 Heart failure, unspecified: Secondary | ICD-10-CM | POA: Diagnosis not present

## 2015-07-25 DIAGNOSIS — I2510 Atherosclerotic heart disease of native coronary artery without angina pectoris: Secondary | ICD-10-CM | POA: Diagnosis not present

## 2015-08-12 DIAGNOSIS — I509 Heart failure, unspecified: Secondary | ICD-10-CM | POA: Diagnosis not present

## 2015-08-19 DIAGNOSIS — I2510 Atherosclerotic heart disease of native coronary artery without angina pectoris: Secondary | ICD-10-CM | POA: Diagnosis not present

## 2015-08-19 DIAGNOSIS — M7989 Other specified soft tissue disorders: Secondary | ICD-10-CM | POA: Diagnosis not present

## 2015-08-19 DIAGNOSIS — I509 Heart failure, unspecified: Secondary | ICD-10-CM | POA: Diagnosis not present

## 2015-08-19 DIAGNOSIS — I8000 Phlebitis and thrombophlebitis of superficial vessels of unspecified lower extremity: Secondary | ICD-10-CM | POA: Diagnosis not present

## 2015-08-29 DIAGNOSIS — R609 Edema, unspecified: Secondary | ICD-10-CM | POA: Diagnosis not present

## 2015-08-29 DIAGNOSIS — E1165 Type 2 diabetes mellitus with hyperglycemia: Secondary | ICD-10-CM | POA: Diagnosis not present

## 2015-08-29 DIAGNOSIS — R601 Generalized edema: Secondary | ICD-10-CM | POA: Diagnosis not present

## 2015-09-12 DIAGNOSIS — I509 Heart failure, unspecified: Secondary | ICD-10-CM | POA: Diagnosis not present

## 2015-10-12 DIAGNOSIS — I509 Heart failure, unspecified: Secondary | ICD-10-CM | POA: Diagnosis not present

## 2015-11-12 DIAGNOSIS — I509 Heart failure, unspecified: Secondary | ICD-10-CM | POA: Diagnosis not present

## 2015-11-19 DIAGNOSIS — E781 Pure hyperglyceridemia: Secondary | ICD-10-CM | POA: Diagnosis not present

## 2015-11-19 DIAGNOSIS — E1165 Type 2 diabetes mellitus with hyperglycemia: Secondary | ICD-10-CM | POA: Diagnosis not present

## 2015-11-19 DIAGNOSIS — J449 Chronic obstructive pulmonary disease, unspecified: Secondary | ICD-10-CM | POA: Diagnosis not present

## 2015-11-19 DIAGNOSIS — I10 Essential (primary) hypertension: Secondary | ICD-10-CM | POA: Diagnosis not present

## 2015-11-28 DIAGNOSIS — Z125 Encounter for screening for malignant neoplasm of prostate: Secondary | ICD-10-CM | POA: Diagnosis not present

## 2015-11-28 DIAGNOSIS — E1165 Type 2 diabetes mellitus with hyperglycemia: Secondary | ICD-10-CM | POA: Diagnosis not present

## 2015-11-28 DIAGNOSIS — I158 Other secondary hypertension: Secondary | ICD-10-CM | POA: Diagnosis not present

## 2015-11-28 DIAGNOSIS — E559 Vitamin D deficiency, unspecified: Secondary | ICD-10-CM | POA: Diagnosis not present

## 2015-11-28 DIAGNOSIS — E784 Other hyperlipidemia: Secondary | ICD-10-CM | POA: Diagnosis not present

## 2015-11-28 DIAGNOSIS — J449 Chronic obstructive pulmonary disease, unspecified: Secondary | ICD-10-CM | POA: Diagnosis not present

## 2015-12-01 DIAGNOSIS — R0602 Shortness of breath: Secondary | ICD-10-CM | POA: Diagnosis not present

## 2015-12-01 DIAGNOSIS — E1159 Type 2 diabetes mellitus with other circulatory complications: Secondary | ICD-10-CM | POA: Diagnosis not present

## 2015-12-01 DIAGNOSIS — I43 Cardiomyopathy in diseases classified elsewhere: Secondary | ICD-10-CM | POA: Diagnosis not present

## 2015-12-01 DIAGNOSIS — R609 Edema, unspecified: Secondary | ICD-10-CM | POA: Diagnosis not present

## 2015-12-03 ENCOUNTER — Encounter: Payer: Medicare Other | Attending: Surgery | Admitting: Surgery

## 2015-12-03 DIAGNOSIS — I890 Lymphedema, not elsewhere classified: Secondary | ICD-10-CM | POA: Insufficient documentation

## 2015-12-03 DIAGNOSIS — E0859 Diabetes mellitus due to underlying condition with other circulatory complications: Secondary | ICD-10-CM | POA: Diagnosis not present

## 2015-12-03 DIAGNOSIS — Z6841 Body Mass Index (BMI) 40.0 and over, adult: Secondary | ICD-10-CM | POA: Insufficient documentation

## 2015-12-03 DIAGNOSIS — E785 Hyperlipidemia, unspecified: Secondary | ICD-10-CM | POA: Insufficient documentation

## 2015-12-03 DIAGNOSIS — I252 Old myocardial infarction: Secondary | ICD-10-CM | POA: Insufficient documentation

## 2015-12-03 DIAGNOSIS — J449 Chronic obstructive pulmonary disease, unspecified: Secondary | ICD-10-CM | POA: Diagnosis not present

## 2015-12-03 DIAGNOSIS — E11622 Type 2 diabetes mellitus with other skin ulcer: Secondary | ICD-10-CM | POA: Diagnosis not present

## 2015-12-03 DIAGNOSIS — L97222 Non-pressure chronic ulcer of left calf with fat layer exposed: Secondary | ICD-10-CM | POA: Diagnosis not present

## 2015-12-03 DIAGNOSIS — I10 Essential (primary) hypertension: Secondary | ICD-10-CM | POA: Insufficient documentation

## 2015-12-03 DIAGNOSIS — I5022 Chronic systolic (congestive) heart failure: Secondary | ICD-10-CM | POA: Diagnosis not present

## 2015-12-03 DIAGNOSIS — I87332 Chronic venous hypertension (idiopathic) with ulcer and inflammation of left lower extremity: Secondary | ICD-10-CM | POA: Insufficient documentation

## 2015-12-03 DIAGNOSIS — Z87891 Personal history of nicotine dependence: Secondary | ICD-10-CM | POA: Insufficient documentation

## 2015-12-03 DIAGNOSIS — M1990 Unspecified osteoarthritis, unspecified site: Secondary | ICD-10-CM | POA: Insufficient documentation

## 2015-12-03 DIAGNOSIS — E1165 Type 2 diabetes mellitus with hyperglycemia: Secondary | ICD-10-CM | POA: Diagnosis not present

## 2015-12-03 DIAGNOSIS — I509 Heart failure, unspecified: Secondary | ICD-10-CM | POA: Diagnosis not present

## 2015-12-04 NOTE — Progress Notes (Signed)
KARDER, GOODIN ([id number]) Visit Report for 12/03/2015 Abuse/Suicide Risk Screen Details Patient Name: [name], [name]. Date of Service: 12/03/2015 2:15 PM Medical Record Number: [id number] Patient Account Number: [id number] Date of Birth/Sex: 11-18-1950 (65 y.o. Male) Treating RN: Phillis Haggis Primary Care Physician: Sherrie Mustache Other Clinician: Referring Physician: Sherrie Mustache Treating Physician/Extender: Rudene Re in Treatment: 0 Abuse/Suicide Risk Screen Items Answer ABUSE/SUICIDE RISK SCREEN: Has anyone close to you tried to hurt or harm you recentlyo No Do you feel uncomfortable with anyone in your familyo No Has anyone forced you do things that you didnot want to doo No Do you have any thoughts of harming yourselfo No Patient displays signs or symptoms of abuse and/or neglect. No Electronic Signature(s) Signed: 12/03/2015 5:42:28 PM By: Alejandro Mulling Entered By: Alejandro Mulling on 12/03/2015 14:36:28 Michelle, Ladarrell Elvera Lennox ([id number]) -------------------------------------------------------------------------------- Activities of Daily Living Details Patient Name: [name]. Date of Service: 12/03/2015 2:15 PM Medical Record Number: [id number] Patient Account Number: [id number] Date of Birth/Sex: 1951/09/02 (65 y.o. Male) Treating RN: Phillis Haggis Primary Care Physician: Sherrie Mustache Other Clinician: Referring Physician: Sherrie Mustache Treating Physician/Extender: Rudene Re in Treatment: 0 Activities of Daily Living Items Answer Activities of Daily Living (Please select one for each item) Drive Automobile Not Able Take Medications Completely Able Use Telephone Completely Able Care for Appearance Completely Able Use Toilet Completely Able Bath / Shower Completely Able Dress Self Completely Able Feed Self Completely Able Walk Need Assistance Get In / Out Bed Need Assistance Housework Not Able Prepare Meals Not Able Handle Money  Completely Able Shop for Self Not Able Electronic Signature(s) Signed: 12/03/2015 5:42:28 PM By: Alejandro Mulling Entered By: Alejandro Mulling on 12/03/2015 14:37:32 Batty, [name] ([id number]) -------------------------------------------------------------------------------- Education Assessment Details Patient Name: [name]. Date of Service: 12/03/2015 2:15 PM Medical Record Number: [id number] Patient Account Number: [id number] Date of Birth/Sex: 11/22/1950 (65 y.o. Male) Treating RN: Phillis Haggis Primary Care Physician: Sherrie Mustache Other Clinician: Referring Physician: Sherrie Mustache Treating Physician/Extender: Rudene Re in Treatment: 0 Primary Learner Assessed: Patient Learning Preferences/Education Level/Primary Language Learning Preference: Explanation Cognitive Barrier Assessment/Beliefs Language Barrier: No Translator Needed: No Memory Deficit: No Emotional Barrier: No Cultural/Religious Beliefs Affecting Medical No Care: Physical Barrier Assessment Impaired Vision: No Impaired Hearing: No Decreased Hand dexterity: No Knowledge/Comprehension Assessment Knowledge Level: High Comprehension Level: High Ability to understand written High instructions: Ability to understand verbal High instructions: Motivation Assessment Anxiety Level: Calm Cooperation: Cooperative Education Importance: Acknowledges Need Interest in Health Problems: Asks Questions Perception: Coherent Willingness to Engage in Self- High Management Activities: Readiness to Engage in Self- High Management Activities: Electronic Signature(s) Signed: 12/03/2015 5:42:28 PM By: Williemae Area, [name] ([id number]) Entered By: Alejandro Mulling on 12/03/2015 14:37:51 Careaga, [name] ([id number]) -------------------------------------------------------------------------------- Fall Risk Assessment Details Patient Name: [name]. Date of Service: 12/03/2015 2:15  PM Medical Record Number: [id number] Patient Account Number: [id number] Date of Birth/Sex: [birth year] (65 y.o. Male) Treating RN: Phillis Haggis Primary Care Physician: Sherrie Mustache Other Clinician: Referring Physician: Sherrie Mustache Treating Physician/Extender: Rudene Re in Treatment: 0 Fall Risk Assessment Items Have you had 2 or more falls in the last 12 monthso 0 No Have you had any fall that resulted in injury in the last 12 monthso 0 No FALL RISK ASSESSMENT: History of falling - immediate or within 3 months 0 No Secondary diagnosis 0 No Ambulatory aid None/bed rest/wheelchair/nurse 0 No Crutches/cane/walker 15 Yes Furniture 0 No IV Access/Saline Lock 0 No Gait/Training Normal/bed rest/immobile  0 No Weak 10 Yes Impaired 20 Yes Mental Status Oriented to own ability 0 No Electronic Signature(s) Signed: 12/03/2015 5:42:28 PM By: Alejandro Mulling Entered By: Alejandro Mulling on 12/03/2015 14:38:09 Dvorsky, Gatlin Elvera Lennox ([id number]) -------------------------------------------------------------------------------- Foot Assessment Details Patient Name: SHOICHI, MIELKE R. Date of Service: 12/03/2015 2:15 PM Medical Record Number: [id number] Patient Account Number: [id number] Date of Birth/Sex: 1951-01-06 (65 y.o. Male) Treating RN: Phillis Haggis Primary Care Physician: Sherrie Mustache Other Clinician: Referring Physician: Sherrie Mustache Treating Physician/Extender: Rudene Re in Treatment: 0 Foot Assessment Items Site Locations + = Sensation present, - = Sensation absent, C = Callus, U = Ulcer R = Redness, W = Warmth, M = Maceration, PU = Pre-ulcerative lesion F = Fissure, S = Swelling, D = Dryness Assessment Right: Left: Other Deformity: No No Prior Foot Ulcer: No No Prior Amputation: No No Charcot Joint: No No Ambulatory Status: Ambulatory With Help Assistance Device: Walker Gait: Steady Electronic Signature(s) Signed: 12/03/2015 5:42:28 PM By:  Alejandro Mulling Entered By: Alejandro Mulling on 12/03/2015 14:40:28 Krupinski, [name] ([id number]) -------------------------------------------------------------------------------- Nutrition Risk Assessment Details Patient Name: CEDRICK, PARTAIN R. Date of Service: 12/03/2015 2:15 PM Medical Record Number: [id number] Patient Account Number: [id number] Date of Birth/Sex: [birth year] (65 y.o. Male) Treating RN: Phillis Haggis Primary Care Physician: Sherrie Mustache Other Clinician: Referring Physician: Sherrie Mustache Treating Physician/Extender: Rudene Re in Treatment: 0 Height (in): 66 Weight (lbs): 360 Body Mass Index (BMI): 58.1 Nutrition Risk Assessment Items NUTRITION RISK SCREEN: I have an illness or condition that made me change the kind and/or 0 No amount of food I eat I eat fewer than two meals per day 0 No I eat few fruits and vegetables, or milk products 0 No I have three or more drinks of beer, liquor or wine almost every day 0 No I have tooth or mouth problems that make it hard for me to eat 0 No I don't always have enough money to buy the food I need 0 No I eat alone most of the time 0 No I take three or more different prescribed or over-the-counter drugs a 1 Yes day Without wanting to, I have lost or gained 10 pounds in the last six 0 No months I am not always physically able to shop, cook and/or feed myself 2 Yes Nutrition Protocols Good Risk Protocol Moderate Risk Protocol Electronic Signature(s) Signed: 12/03/2015 5:42:28 PM By: Alejandro Mulling Entered By: Alejandro Mulling on 12/03/2015 14:38:35

## 2015-12-04 NOTE — Progress Notes (Signed)
Ernest, [name] ([id number]) Visit Report for 12/03/2015 Chief Complaint Document Details Patient Name: [name], [name]. Date of Service: 12/03/2015 2:15 PM Medical Record Number: [id number] Patient Account Number: [id number] Date of Birth/Sex: 10-11-1951 (65 y.o. Male) Treating RN: [name] Primary Care Physician: [name] Other Clinician: Referring Physician: Sherrie [name] Treating Physician/Extender: [name] in Treatment: 0 Information Obtained from: Patient Chief Complaint Patient returns to the wound care center for reopened ulcer to: the left lower extremity with swelling. Electronic Signature(s) Signed: 12/03/2015 3:06:14 PM By: Ernest Kanner MD, FACS Entered By: [name] on 12/03/2015 15:06:14 [name], [name] ([id number]) -------------------------------------------------------------------------------- HPI Details Patient Name: [name], [name]. Date of Service: 12/03/2015 2:15 PM Medical Record Number: [id number] Patient Account Number: [id number] Date of Birth/Sex: 1951/11/04 (65 y.o. Male) Treating RN: [name] Primary Care Physician: [name] Other Clinician: Referring Physician: Sherrie [name] Treating Physician/Extender: [name] in Treatment: 0 History of Present Illness Location: left calf Quality: Patient reports experiencing a dull pain to affected area(s). Severity: Patient states wound are getting worse. Duration: Patient has had the wound for 1 months prior to seeking treatment at the wound center Timing: Pain in wound is Intermittent (comes and goes Context: The wound appeared gradually over time Associated Signs and Symptoms: Patient reports having difficulty standing for long periods. HPI Description: This 65 year old gentleman comes to see [place] for weeping of an ulcer on the left lower extremity. Other comorbidities include morbid obesity, hyperlipidemia, diabetes mellitus type 2, COPD,  hypertension, congestive heart failure and prostate problems. He was here in September of last year and was discharged after appropriate treatment for his condition and we had asked him to get vascular studies and he does say that he has had them done but does not know the results. He wears his compression stockings on and off but recently has not been wearing them because they get too tight. We have just obtained his results from the [facility] -- he was last seen there in October 2016 and at that stage his supportive facial thrombophlebitis on the left lower extremity had resolved and there was no venous reflux or venous disease seen on the right leg on his previous study. He was asked to continue with Plavix and elevation and exercise with compression stockings. No venous intervention would benefit him. We have also reviewed the reflux study done on September 15 for both lower extremities which showed no evidence of right lower extremity deep with thrombosis or superficial thrombophlebitis. No evidence of left lower extremity deep vein thrombosis but there was evidence of left lower extremity superficial thrombophlebitis. No incompetence of the greater small saphenous veins are noted bilaterally. Electronic Signature(s) Signed: 12/03/2015 3:42:37 PM By: Ernest Kanner MD, FACS Previous Signature: 12/03/2015 3:07:48 PM Version By: Ernest Kanner MD, FACS Entered By: [name] on 12/03/2015 15:42:37 [name], [name] ([id number]) -------------------------------------------------------------------------------- Physical Exam Details Patient Name: [name], Ernest R. Date of Service: 12/03/2015 2:15 PM Medical Record Number: [id number] Patient Account Number: [id number] Date of Birth/Sex: [birth year] (65 y.o. Male) Treating RN: [name] Primary Care Physician: [name] Other Clinician: Referring Physician: Sherrie [name] Treating Physician/Extender: [name] in Treatment: 0 Constitutional . Pulse regular. Respirations normal and unlabored. Afebrile. . Eyes Nonicteric. Reactive to light. Ears, Nose, Mouth, and Throat Lips, teeth, and gums WNL.Marland Kitchen Moist mucosa without lesions. Neck supple and nontender. No palpable supraclavicular or cervical adenopathy. Normal sized without goiter. Respiratory WNL. No retractions.. Cardiovascular Pedal Pulses WNL. ABI  on the left was 1.07. No clubbing, cyanosis or edema. Gastrointestinal (GI) Abdomen without masses or tenderness.. No liver or spleen enlargement or tenderness.. Lymphatic No adneopathy. No adenopathy. No adenopathy. Musculoskeletal Adexa without tenderness or enlargement.. Digits and nails w/o clubbing, cyanosis, infection, petechiae, ischemia, or inflammatory conditions.. Integumentary (Hair, Skin) No suspicious lesions. No crepitus or fluctuance. No peri-wound warmth or erythema. No masses.Marland Kitchen Psychiatric Judgement and insight Intact.. No evidence of depression, anxiety, or agitation.. Notes he has significant amount of ulceration on the left lower extremity and the region of the left ankle 2 separate areas of ulceration down to the subcutaneous tissue. The ulcerated areas are fairly clean and there was no debridement required. Electronic Signature(s) Signed: 12/03/2015 3:08:51 PM By: Ernest Kanner MD, FACS Entered By: [name] on 12/03/2015 15:08:50 Ernest, [name] ([id number]) -------------------------------------------------------------------------------- Physician Orders Details Patient Name: [name], [name]. Date of Service: 12/03/2015 2:15 PM Medical Record Number: [id number] Patient Account Number: [id number] Date of Birth/Sex: 05-02-1951 (65 y.o. Male) Treating RN: [name] Primary Care Physician: [name] Other Clinician: Referring Physician: Sherrie [name] Treating Physician/Extender: [name] in Treatment: 0 Verbal / Phone Orders:  Yes Clinician: Pinkerton, Debi Read Back and Verified: Yes Diagnosis Coding Wound Cleansing Wound #3 Left,Proximal,Lateral Lower Leg o Clean wound with Normal Saline. Wound #4 Left,Distal,Lateral Lower Leg o Clean wound with Normal Saline. Primary Wound Dressing Wound #3 Left,Proximal,Lateral Lower Leg o Aquacel Ag Wound #4 Left,Distal,Lateral Lower Leg o Aquacel Ag Secondary Dressing Wound #3 Left,Proximal,Lateral Lower Leg o ABD pad o Drawtex Wound #4 Left,Distal,Lateral Lower Leg o ABD pad o Drawtex Dressing Change Frequency Wound #3 Left,Proximal,Lateral Lower Leg o Change dressing every week Wound #4 Left,Distal,Lateral Lower Leg o Change dressing every week Follow-up Appointments Wound #3 Left,Proximal,Lateral Lower Leg o Return Appointment in 1 week. Wound #4 Left,Distal,Lateral Lower Leg o Return Appointment in 1 week. JAYCE, KAINZ ([id number]) Edema Control Wound #3 Left,Proximal,Lateral Lower Leg o Unna Boot to Left Lower Extremity o Elevate legs to the level of the heart and pump ankles as often as possible Wound #4 Left,Distal,Lateral Lower Leg o Unna Boot to Left Lower Extremity o Elevate legs to the level of the heart and pump ankles as often as possible Electronic Signature(s) Signed: 12/03/2015 4:12:51 PM By: Ernest Kanner MD, FACS Signed: 12/03/2015 5:42:28 PM By: Alejandro Mulling Entered By: Alejandro Mulling on 12/03/2015 15:06:48 Pineda, Samuell Elvera Lennox ([id number]) -------------------------------------------------------------------------------- Problem List Details Patient Name: [name], PRIMM. Date of Service: 12/03/2015 2:15 PM Medical Record Number: [id number] Patient Account Number: [id number] Date of Birth/Sex: 17-Apr-1951 (65 y.o. 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 Date of Service: 12/03/2015 2:15 PM Medical Record Number: [id number] Patient Account Number: [id number] Date of Birth/Sex: [birth year] (65 y.o. Male) Treating  RN: [name] Primary Care Physician: [name] Other Clinician: Referring Physician: Sherrie [name] Treating Physician/Extender: [name] in Treatment: 0 Subjective Chief Complaint Information obtained from Patient Patient returns to the wound care center for reopened ulcer to: the left lower extremity with swelling. History of Present Illness (HPI) The following HPI elements were documented for the patient's wound: Location: left calf Quality: Patient reports experiencing a dull pain to affected area(s). Severity: Patient states wound are getting worse. Duration: Patient has had the wound for 1 months prior to seeking treatment at the wound center Timing: Pain in wound is Intermittent (comes and goes Context: The wound appeared gradually over time Associated Signs and Symptoms: Patient reports having  difficulty standing for long periods. This 65 year old gentleman comes to see [place] for weeping of an ulcer on the left lower extremity. Other comorbidities include morbid obesity, hyperlipidemia, diabetes mellitus type 2, COPD, hypertension, congestive heart failure and prostate problems. He was here in September of last year and was discharged after appropriate treatment for his condition and we had asked him to get vascular studies and he does say that he has had them done but does not know the results. He wears his compression stockings on and off but recently has not been wearing them because they get too tight. We have just obtained his results from the [facility] -- he was last seen there in October 2016 and at that stage his supportive facial thrombophlebitis on the left lower extremity had resolved and there was no venous reflux or venous disease seen on the right leg on his previous study. He was asked to continue with Plavix and elevation and exercise with compression stockings. No venous intervention would benefit him. We have also reviewed the reflux study done on September 15 for both lower extremities which showed no evidence of right lower extremity deep with thrombosis or superficial thrombophlebitis. No evidence of left lower extremity deep vein thrombosis but there was evidence of left lower extremity superficial thrombophlebitis. No incompetence of the greater small saphenous veins are noted bilaterally. Wound History Patient reportedly has not tested positive for osteomyelitis. Patient reportedly has had testing performed to evaluate circulation in the legs. Patient experiences the following problems associated with their wounds: swelling. [name], [name] ([id number]) Patient History Information obtained from Patient. Allergies morphine (Reaction: hallucinations) Family History Cancer - Father, Heart Disease - Mother, Hypertension - Mother, No  family history of Diabetes, Hereditary Spherocytosis, Kidney Disease, Lung Disease, Seizures, Stroke, Thyroid Problems, Tuberculosis. Social History Former smoker - quit 30 years ago, Marital Status - Married, Alcohol Use - Never, Drug Use - No History, Caffeine Use - Daily. Medical And Surgical History Notes Cardiovascular Heart murmur; Heart cath at the time of MI Objective Constitutional Pulse regular. Respirations normal and unlabored. Afebrile. Vitals Time Taken: 2:31 PM, Height: 66 in, Source: Stated, Weight: 360 lbs, Source: Stated, BMI: 58.1, Temperature: 97.7 F, Pulse: 77 bpm, Respiratory Rate: 20 breaths/min, Blood Pressure: 142/73 mmHg. Eyes Nonicteric. Reactive to light. Ears, Nose, Mouth, and Throat Lips, teeth, and gums WNL.Marland Kitchen Moist mucosa without lesions. Neck supple and nontender. No palpable supraclavicular or cervical adenopathy. Normal sized without goiter. Respiratory WNL. No retractions.. Cardiovascular Edgerly, Keondre R. ([id number]) Pedal Pulses WNL. ABI on the left was 1.07. No clubbing, cyanosis or edema. Gastrointestinal (GI) Abdomen without masses or tenderness.. No liver or spleen enlargement or tenderness.. Lymphatic No adneopathy. No adenopathy. No adenopathy. Musculoskeletal Adexa without tenderness or enlargement.. Digits and nails  w/o clubbing, cyanosis, infection, petechiae, ischemia, or inflammatory conditions.Marland Kitchen Psychiatric Judgement and insight Intact.. No evidence of depression, anxiety, or agitation.. General Notes: he has significant amount of ulceration on the left lower extremity and the region of the left ankle 2 separate areas of ulceration down to the subcutaneous tissue. The ulcerated areas are fairly clean and there was no debridement required. Integumentary (Hair, Skin) No suspicious lesions. No crepitus or fluctuance. No peri-wound warmth or erythema. No masses.. Wound #3 status is Open. Original cause of wound was Gradually  Appeared. The wound is located on the Left,Proximal,Lateral Lower Leg. The wound measures 8cm length x 3.5cm width x 0.1cm depth; 21.991cm^2 area and 2.199cm^3 volume. The wound is limited to skin breakdown. There is no tunneling or undermining noted. There is a large amount of serous drainage noted. The wound margin is flat and intact. There is small (1-33%) pink granulation within the wound bed. There is a large (67-100%) amount of necrotic tissue within the wound bed including Eschar and Adherent Slough. The periwound skin appearance exhibited: Localized Edema, Moist. Wound #4 status is Open. Original cause of wound was Gradually Appeared. The wound is located on the Left,Distal,Lateral Lower Leg. The wound measures 2cm length x 1.5cm width x 0.1cm depth; 2.356cm^2 area and 0.236cm^3 volume. The wound is limited to skin breakdown. There is no tunneling or undermining noted. There is a large amount of serous drainage noted. The wound margin is flat and intact. There is small (1-33%) pink granulation within the wound bed. There is a large (67-100%) amount of necrotic tissue within the wound bed including Adherent Slough. The periwound skin appearance exhibited: Localized Edema, Moist. Periwound temperature was noted as No Abnormality. The periwound has tenderness on palpation. Assessment Active Problems ICD-10 E11.622 - Type 2 diabetes mellitus with other skin ulcer E08.59 - Diabetes mellitus due to underlying condition with other circulatory complications [name], [name]. 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 We have told him to wear his compression stockings on the right  lower extremity and he says he will do this. Elevation in his exercise of also been discussed with him. Plan Wound Cleansing: Wound #3 Left,Proximal,Lateral Lower Leg: Clean wound with Normal Saline. Wound #4 Left,Distal,Lateral Lower Leg: Clean wound with Normal Saline. Primary Wound Dressing: Wound #3 Left,Proximal,Lateral Lower Leg: Aquacel Ag Wound #4 Left,Distal,Lateral Lower Leg: Aquacel Ag Secondary Dressing: Wound #3 Left,Proximal,Lateral Lower Leg: ABD pad Drawtex Wound #4 Left,Distal,Lateral Lower Leg: ABD pad Drawtex Dressing Change Frequency: Wound #3 Left,Proximal,Lateral Lower Leg: Change dressing every week Wound #4 Left,Distal,Lateral Lower Leg: Change dressing every week Follow-up Appointments: Wound #3 Left,Proximal,Lateral Lower Leg: Return Appointment in 1 week. Wound #4 Left,Distal,Lateral Lower Leg: Return Appointment in 1 week. Edema Control: Wound #3 Left,Proximal,Lateral Lower Leg: Unna Boot to Left Lower Extremity Elevate legs to the level of the heart and pump ankles as often as possible Wound #4 Left,Distal,Lateral Lower Leg: Doshi, Stevenson R. ([id number]) Henriette Combs to Left Lower Extremity Elevate legs to the level of the heart and pump ankles as often as possible I have recommended silver alginate and an Unna's boot on the left side. We have told him to wear his compression stockings on the right lower extremity and he  says he will do this. Elevation in his exercise of also been discussed with him. Electronic Signature(s) Signed: 12/03/2015 4:15:28 PM By: Ernest Kanner MD, FACS Previous Signature: 12/03/2015 3:10:54 PM Version By: Ernest Kanner MD, FACS Previous Signature: 12/03/2015 3:10:15 PM Version By: Ernest Kanner MD, FACS Entered By: [name] on 12/03/2015 16:15:28 [name], [name] ([id number]) -------------------------------------------------------------------------------- ROS/PFSH Details Patient Name: [name], STOCKARD. Date of  Service: 12/03/2015 2:15 PM Medical Record Number: [id number] Patient Account Number: [id number] Date of Birth/Sex: 05-Aug-1951 (65 y.o. Male) Treating RN: [name] Primary Care Physician: [name] Other Clinician: Referring Physician: Sherrie [name] Treating Physician/Extender: [name] in Treatment: 0 Information Obtained From Patient Wound History Do you currently have one or more open woundso Yes Has your wound(s) ever healed and then [name]-openedo Yes Have you had any lab work done in the past montho No Have you tested positive for an antibiotic resistant organism (MRSA, VRE)o No Have you tested positive for osteomyelitis (bone infection)o No Have you had any tests for circulation on your legso Yes Who ordered the testo Dr. Lawerance Bach Where was the test doneo a couple of months ago Have you had other problems associated with your woundso Swelling Ear/Nose/Mouth/Throat Medical History: Positive for: Chronic sinus problems/congestion Respiratory Medical History: Positive for: Chronic Obstructive Pulmonary Disease (COPD); Sleep Apnea - does not wear CPAP Cardiovascular Medical History: Positive for: Congestive Heart Failure; Hypertension; Myocardial Infarction - 1990's Negative for: Arrhythmia Past Medical History Notes: Heart murmur; Heart cath at the time of MI Gastrointestinal Medical History: Negative for: Cirrhosis ; Colitis; Crohnos; Hepatitis A; Hepatitis B Endocrine Medical History: Positive for: Type II Diabetes Time with diabetes: 15 years Treated with: Oral agents Velez, Kekai R. ([id number]) Blood sugar testing results: Breakfast: 201 Immunological Medical History: Negative for: Lupus Erythematosus; Raynaudos; Scleroderma Integumentary (Skin) Medical History: Negative for: History of Burn; History of pressure wounds Musculoskeletal Medical History: Positive for: Osteoarthritis Neurologic Medical History: Negative for: Dementia;  Neuropathy; Quadriplegia; Paraplegia Oncologic Medical History: Negative for: Received Chemotherapy; Received Radiation Psychiatric Medical History: Positive for: Confinement Anxiety HBO Extended History Items Ear/Nose/Mouth/Throat: Chronic sinus problems/congestion Family and Social History Cancer: Yes - Father; Diabetes: No; Heart Disease: Yes - Mother; Hereditary Spherocytosis: No; Hypertension: Yes - Mother; Kidney Disease: No; Lung Disease: No; Seizures: No; Stroke: No; Thyroid Problems: No; Tuberculosis: No; Former smoker - quit 30 years ago; Marital Status - Married; Alcohol Use: Never; Drug Use: No History; Caffeine Use: Daily; Financial Concerns: No; Food, Clothing or Shelter Needs: No; Support System Lacking: No; Transportation Concerns: No; Advanced Directives: Yes (Not Provided); Patient does not want information on Advanced Directives; Do not resuscitate: No; Living Will: Yes (Not Provided); Medical Power of Attorney: Yes - Kenry Daubert- wife (Not Provided) Physician Affirmation I have reviewed and agree with the above information. Electronic Signature(s) Signed: 12/03/2015 3:10:32 PM By: Ernest Kanner MD, FACS [name], [name] ([id number]) Signed: 12/03/2015 5:42:28 PM By: Alejandro Mulling Entered By: [name] on 12/03/2015 15:10:32 [name], [name] ([id number]) -------------------------------------------------------------------------------- SuperBill Details Patient Name: RAMAL, ECKHARDT. Date of Service: 12/03/2015 Medical Record Number: [id number] Patient Account Number: [id number] Date of Birth/Sex: 1951/09/24 (65 y.o. Male) Treating RN: [name] Primary Care Physician: [name] Other Clinician: Referring Physician: Sherrie [name] Treating Physician/Extender: [name] in Treatment: 0 Diagnosis Coding ICD-10 Codes Code Description E11.622 Type 2 diabetes mellitus with other skin ulcer E08.59 Diabetes mellitus due to underlying condition  with other circulatory complications L97.222 Non-pressure chronic ulcer of left calf with fat  layer exposed I89.0 Lymphedema, not elsewhere classified Chronic venous hypertension (idiopathic) with ulcer and inflammation of left lower I87.332 extremity I50.22 Chronic systolic (congestive) heart failure Facility Procedures CPT4 Code: 16109604 Description: (Facility Use Only) 29580LT - APPLY UNNA BOOT LT Modifier: Quantity: 1 Physician Procedures CPT4: Description Modifier Quantity Code [id number] - WC PHYS LEVEL 4 - EST PT 1 ICD-10 Description Diagnosis E11.622 Type 2 diabetes mellitus with other skin ulcer E08.59 Diabetes mellitus due to underlying condition with other circulatory  complications L97.222 Non-pressure chronic ulcer of left calf with fat layer exposed I89.0 Lymphedema, not elsewhere classified Electronic Signature(s) Signed: 12/03/2015 5:42:28 PM By: Alejandro Mulling Previous Signature: 12/03/2015 3:43:02 PM Version By: Ernest Kanner MD, FACS Entered By: Alejandro Mulling on 12/03/2015 17:14:19

## 2015-12-04 NOTE — Progress Notes (Signed)
LEXIE, MORINI ([id number]) Visit Report for 12/03/2015 Allergy List Details Patient Name: [name], [name]. Date of Service: 12/03/2015 2:15 PM Medical Record Number: [id number] Patient Account Number: [id number] Date of Birth/Sex: 12-30-1950 (65 y.o. Male) Treating RN: Phillis Haggis Primary Care Physician: Sherrie Mustache Other Clinician: Referring Physician: Sherrie Mustache Treating Physician/Extender: Rudene Re in Treatment: 0 Allergies Active Allergies morphine Reaction: hallucinations Allergy Notes Electronic Signature(s) Signed: 12/03/2015 5:42:28 PM By: Alejandro Mulling Entered By: Alejandro Mulling on 12/03/2015 15:05:15 Groom, [name] ([id number]) -------------------------------------------------------------------------------- Arrival Information Details Patient Name: [name], [name]. Date of Service: 12/03/2015 2:15 PM Medical Record Number: [id number] Patient Account Number: [id number] Date of Birth/Sex: May 18, 1951 (65 y.o. Male) Treating RN: Phillis Haggis Primary Care Physician: Sherrie Mustache Other Clinician: Referring Physician: Sherrie Mustache Treating Physician/Extender: Rudene Re in Treatment: 0 Visit Information Patient Arrived: Wheel Chair Arrival Time: 14:29 Accompanied By: cousin Transfer Assistance: None Patient Identification Verified: Yes Secondary Verification Process Yes Completed: Patient Requires Transmission- No Based Precautions: Patient Has Alerts: Yes Patient Alerts: Patient on Blood Thinner Plavix ASA DM II History Since Last Visit All ordered tests and consults were completed: No Added or deleted any medications: No Any new allergies or adverse reactions: No Had a fall or experienced change in activities of daily living that may affect risk of falls: No Signs or symptoms of abuse/neglect since last visito No Hospitalized since last visit: No Electronic Signature(s) Signed: 12/03/2015 5:42:28 PM By: Alejandro Mulling Entered By: Alejandro Mulling on 12/03/2015 14:30:51 [name], [name] ([id number]) -------------------------------------------------------------------------------- Clinic Level of Care Assessment Details Patient Name: [name], [name]. Date of Service: 12/03/2015 2:15 PM Medical Record Number: [id number] Patient Account Number: [id number] Date of Birth/Sex: 1950-12-19 (65 y.o. Male) Treating RN: Phillis Haggis Primary Care Physician: Sherrie Mustache Other Clinician: Referring Physician: Sherrie Mustache Treating Physician/Extender: Rudene Re in Treatment: 0 Clinic Level of Care Assessment Items TOOL 2 Quantity Score X - Use when only an EandM is performed on the INITIAL visit 1 0 ASSESSMENTS - Nursing Assessment / Reassessment  - General Physical Exam (combine w/ comprehensive assessment (listed just 0 below) when performed on new pt. evals) X - Comprehensive Assessment (HX, ROS, Risk Assessments, Wounds Hx, etc.) 1 25 ASSESSMENTS - Wound and Skin Assessment / Reassessment  - Simple Wound Assessment / Reassessment - one wound 0 X - Complex Wound Assessment / Reassessment - multiple wounds 2 5  - Dermatologic / Skin Assessment (not related to wound area) 0 ASSESSMENTS - Ostomy and/or Continence Assessment and Care  - Incontinence Assessment and Management 0  - Ostomy Care Assessment and Management (repouching, etc.) 0 PROCESS - Coordination of Care  - Simple Patient / Family Education for ongoing care 0 X - Complex (extensive) Patient / Family Education for ongoing care 1 20 X - Staff obtains Chiropractor, Records, Test Results / Process Orders 1 10  - Staff telephones HHA, Nursing Homes / Clarify orders / etc 0  - Routine Transfer to another Facility (non-emergent condition) 0  - Routine Hospital Admission (non-emergent condition) 0  - New Admissions / Manufacturing engineer / Ordering NPWT, Apligraf, etc. 0  - Emergency Hospital Admission (emergent  condition) 0 X - Simple Discharge Coordination 1 10 Rymer, Johnnathan R. ([id number])  - Complex (extensive) Discharge Coordination 0 PROCESS - Special Needs  - Pediatric / Minor Patient Management 0  - Isolation Patient Management 0  - Hearing / Language / Visual special needs 0  - Assessment of Community assistance (transportation, D/C planning,  etc.) 0 []  - Additional assistance / Altered mentation 0 []  - Support Surface(s) Assessment (bed, cushion, seat, etc.) 0 INTERVENTIONS - Wound Cleansing / Measurement X - Wound Imaging (photographs - any number of wounds) 1 5 []  - Wound Tracing (instead of photographs) 0 []  - Simple Wound Measurement - one wound 0 X - Complex Wound Measurement - multiple wounds 2 5 []  - Simple Wound Cleansing - one wound 0 X - Complex Wound Cleansing - multiple wounds 2 5 INTERVENTIONS - Wound Dressings X - Small Wound Dressing one or multiple wounds 1 10 X - Medium Wound Dressing one or multiple wounds 1 15 []  - Large Wound Dressing one or multiple wounds 0 []  - Application of Medications - injection 0 INTERVENTIONS - Miscellaneous []  - External ear exam 0 []  - Specimen Collection (cultures, biopsies, blood, body fluids, etc.) 0 []  - Specimen(s) / Culture(s) sent or taken to Lab for analysis 0 []  - Patient Transfer (multiple staff / Nurse, adult / Similar devices) 0 []  - Simple Staple / Suture removal (25 or less) 0 []  - Complex Staple / Suture removal (26 or more) 0 [name], Ernest R. ([id number]) []  - Hypo / Hyperglycemic Management (close monitor of Blood Glucose) 0 []  - Ankle / Brachial Index (ABI) - do not check if billed separately 0 Has the patient been seen at the hospital within the last three years: Yes Total Score: 125 Level Of Care: New/Established - Level 4 Electronic Signature(s) Signed: 12/03/2015 5:42:28 PM By: Alejandro Mulling Entered By: Alejandro Mulling on 12/03/2015 17:14:42 [name], [name]  ([id number]) -------------------------------------------------------------------------------- Encounter Discharge Information Details Patient Name: [name], Ernest R. Date of Service: 12/03/2015 2:15 PM Medical Record Number: [id number] Patient Account Number: [id number] Date of Birth/Sex: [birth year] (65 y.o. Male) Treating RN: Phillis Haggis Primary Care Physician: Sherrie Mustache Other Clinician: Referring Physician: Sherrie Mustache Treating Physician/Extender: Rudene Re in Treatment: 0 Encounter Discharge Information Items Discharge Pain Level: 0 Discharge Condition: Stable Ambulatory Status: Wheelchair Discharge Destination: Home Transportation: Private Auto Accompanied By: cousin Schedule Follow-up Appointment: Yes Medication Reconciliation completed and provided to Patient/Care Yes Jordon Kristiansen: Provided on Clinical Summary of Care: 12/03/2015 Form Type Recipient Paper Patient GW Electronic Signature(s) Signed: 12/03/2015 3:25:15 PM By: Gwenlyn Perking Entered By: Gwenlyn Perking on 12/03/2015 15:25:15 [name], [name] ([id number]) -------------------------------------------------------------------------------- Lower Extremity Assessment Details Patient Name: [name], Ernest R. Date of Service: 12/03/2015 2:15 PM Medical Record Number: [id number] Patient Account Number: [id number] Date of Birth/Sex: [birth year] (65 y.o. Male) Treating RN: Phillis Haggis Primary Care Physician: Sherrie Mustache Other Clinician: Referring Physician: Sherrie Mustache Treating Physician/Extender: Rudene Re in Treatment: 0 Edema Assessment Assessed: [Left: No] [Right: No] Edema: [Left: Ye] [Right: s] Calf Left: Right: Point of Measurement: 37 cm From Medial Instep 57.5 cm cm Ankle Left: Right: Point of Measurement: 12 cm From Medial Instep 36.5 cm cm Vascular Assessment Pulses: Posterior Tibial Dorsalis Pedis Palpable: [Left:No] Doppler: [Left:Monophasic] Extremity colors, hair  growth, and conditions: Extremity Color: [Left:Hyperpigmented] Temperature of Extremity: [Left:Warm] Capillary Refill: [Left:< 3 seconds] Blood Pressure: Brachial: [Left:140] Dorsalis Pedis: 150 [Left:Dorsalis Pedis:] Ankle: Posterior Tibial: [Left:Posterior Tibial: 1.07] Toe Nail Assessment Left: Right: Thick: Yes Discolored: Yes Deformed: No Improper Length and Hygiene: Yes Electronic Signature(s) Signed: 12/03/2015 5:42:28 PM By: Williemae Area, [name] ([id number]) Entered By: Alejandro Mulling on 12/03/2015 14:46:25 Grassi, Calden Elvera [name] ([id number]) -------------------------------------------------------------------------------- Multi Wound Chart Details Patient Name: Ernest Hua. Date of Service: 12/03/2015 2:15 PM Medical Record Number: [id number] Patient Account Number: [id number] Date of  Birth/Sex: Oct 18, 1951 (65 y.o. Male) Treating RN: Ashok Cordia, Debi Primary Care Physician: Sherrie Mustache Other Clinician: Referring Physician: Sherrie Mustache Treating Physician/Extender: Rudene Re in Treatment: 0 Vital Signs Height(in): 66 Pulse(bpm): 77 Weight(lbs): 360 Blood Pressure 142/73 (mmHg): Body Mass Index(BMI): 58 Temperature(F): 97.7 Respiratory Rate 20 (breaths/min): Wound Assessments Treatment Notes Electronic Signature(s) Signed: 12/03/2015 5:42:28 PM By: Alejandro Mulling Entered By: Alejandro Mulling on 12/03/2015 14:40:45 [name], [name] ([id number]) -------------------------------------------------------------------------------- Multi-Disciplinary Care Plan Details Patient Name: [name], [name]. Date of Service: 12/03/2015 2:15 PM Medical Record Number: [id number] Patient Account Number: [id number] Date of Birth/Sex: [birth year] (65 y.o. Male) Treating RN: Phillis Haggis Primary Care Physician: Sherrie Mustache Other Clinician: Referring Physician: Sherrie Mustache Treating Physician/Extender: Rudene Re in Treatment: 0 Active  Inactive Abuse / Safety / Falls / Self Care Management Nursing Diagnoses: Potential for falls Goals: Patient will remain injury free Date Initiated: 12/03/2015 Goal Status: Active Interventions: Assess fall risk on admission and as needed Notes: Nutrition Nursing Diagnoses: Imbalanced nutrition Goals: Patient/caregiver will maintain therapeutic glucose control Date Initiated: 12/03/2015 Goal Status: Active Interventions: Assess patient nutrition upon admission and as needed per policy Notes: Orientation to the Wound Care Program Nursing Diagnoses: Knowledge deficit related to the wound healing center program Goals: Patient/caregiver will verbalize understanding of the Wound Healing Center Program Date Initiated: 12/03/2015 ASHDON, GILLSON ([id number]) Goal Status: Active Interventions: Provide education on orientation to the wound center Notes: Pain, Acute or Chronic Nursing Diagnoses: Pain, acute or chronic: actual or potential Potential alteration in comfort, pain Goals: Patient will verbalize adequate pain control and receive pain control interventions during procedures as needed Date Initiated: 12/03/2015 Goal Status: Active Interventions: Assess comfort goal upon admission Notes: Wound/Skin Impairment Nursing Diagnoses: Impaired tissue integrity Goals: Ulcer/skin breakdown will have a volume reduction of 30% by week 4 Date Initiated: 12/03/2015 Goal Status: Active Ulcer/skin breakdown will have a volume reduction of 50% by week 8 Date Initiated: 12/03/2015 Goal Status: Active Ulcer/skin breakdown will have a volume reduction of 80% by week 12 Date Initiated: 12/03/2015 Goal Status: Active Interventions: Assess patient/caregiver ability to obtain necessary supplies Notes: Electronic Signature(s) Signed: 12/03/2015 5:42:28 PM By: Williemae Area, [name] ([id number]) Entered By: Alejandro Mulling on 12/03/2015 17:12:19 [name], [name]  ([id number]) -------------------------------------------------------------------------------- Pain Assessment Details Patient Name: Ernest Hua. Date of Service: 12/03/2015 2:15 PM Medical Record Number: [id number] Patient Account Number: [id number] Date of Birth/Sex: [birth year] (65 y.o. Male) Treating RN: Phillis Haggis Primary Care Physician: Sherrie Mustache Other Clinician: Referring Physician: Sherrie Mustache Treating Physician/Extender: Rudene Re in Treatment: 0 Active Problems Location of Pain Severity and Description of Pain Patient Has Paino Yes Site Locations Pain Location: Pain in Ulcers With Dressing Change: Yes Duration of the Pain. Constant / Intermittento Intermittent How Long Does it Lasto Hours: 2 Minutes: Rate the pain. Current Pain Level: 6 Character of Pain Describe the Pain: Sharp Pain Management and Medication Current Pain Management: Electronic Signature(s) Signed: 12/03/2015 5:42:28 PM By: Alejandro Mulling Entered By: Alejandro Mulling on 12/03/2015 14:31:15 Zehnder, [name] ([id number]) -------------------------------------------------------------------------------- Patient/Caregiver Education Details Patient Name: MARTY, UY. Date of Service: 12/03/2015 2:15 PM Medical Record Number: [id number] Patient Account Number: [id number] Date of Birth/Gender: [birth year] (65 y.o. Male) Treating RN: Phillis Haggis Primary Care Physician: Sherrie Mustache Other Clinician: Referring Physician: Sherrie Mustache Treating Physician/Extender: Rudene Re in Treatment: 0 Education Assessment Education Provided To: Patient Education Topics Provided Wound/Skin Impairment: Handouts: Other: do not get wrap wet Methods: Demonstration, Explain/Verbal Responses: State content  correctly Electronic Signature(s) Signed: 12/03/2015 5:42:28 PM By: Alejandro Mulling Entered By: Alejandro Mulling on 12/03/2015 15:08:15 Weis, [name]  ([id number]) -------------------------------------------------------------------------------- Wound Assessment Details Patient Name: ORANGE, HILLIGOSS. Date of Service: 12/03/2015 2:15 PM Medical Record Number: [id number] Patient Account Number: [id number] Date of Birth/Sex: January 06, 1951 (65 y.o. Male) Treating RN: Phillis Haggis Primary Care Physician: Sherrie Mustache Other Clinician: Referring Physician: Sherrie Mustache Treating Physician/Extender: Rudene Re in Treatment: 0 Wound Status Wound Number: 3 Primary To be determined Etiology: Wound Location: Left Lower Leg - Lateral, Proximal Wound Open Status: Wounding Event: Gradually Appeared Comorbid Chronic sinus problems/congestion, Date Acquired: 11/26/2015 History: Chronic Obstructive Pulmonary Disease Weeks Of Treatment: 0 (COPD), Sleep Apnea, Congestive Heart Clustered Wound: No Failure, Hypertension, Myocardial Infarction, Type II Diabetes, Osteoarthritis, Confinement Anxiety Photos Photo Uploaded By: Alejandro Mulling on 12/03/2015 17:34:06 Wound Measurements Length: (cm) 8 Width: (cm) 3.5 Depth: (cm) 0.1 Area: (cm) 21.991 Volume: (cm) 2.199 % Reduction in Area: 0% % Reduction in Volume: 0% Epithelialization: None Tunneling: No Undermining: No Wound Description Classification: Partial Thickness Foul Odor Aft Diabetic Severity (Wagner): Grade 1 Wound Margin: Flat and Intact Exudate Amount: Large Exudate Type: Serous Exudate Color: amber er Cleansing: No Wound Bed Cedrone, Cleavon R. ([id number]) Granulation Amount: Small (1-33%) Exposed Structure Granulation Quality: Pink Fascia Exposed: No Necrotic Amount: Large (67-100%) Fat Layer Exposed: No Necrotic Quality: Eschar, Adherent Slough Tendon Exposed: No Muscle Exposed: No Joint Exposed: No Bone Exposed: No Limited to Skin Breakdown Periwound Skin Texture Texture Color No Abnormalities Noted: No No Abnormalities Noted: No Localized Edema:  Yes Moisture No Abnormalities Noted: No Moist: Yes Wound Preparation Ulcer Cleansing: Rinsed/Irrigated with Saline Topical Anesthetic Applied: Other: lidocaine 4%, Treatment Notes Wound #3 (Left, Proximal, Lateral Lower Leg) 1. Cleansed with: Clean wound with Normal Saline 2. Anesthetic Topical Lidocaine 4% cream to wound bed prior to debridement 4. Dressing Applied: Aquacel Ag 5. Secondary Dressing Applied ABD Pad 7. Secured with Field seismologist to Left Lower Extremity Notes Drawtex Electronic Signature(s) Signed: 12/03/2015 5:42:28 PM By: Alejandro Mulling Entered By: Alejandro Mulling on 12/03/2015 15:01:33 Bloomquist, [name] ([id number]) -------------------------------------------------------------------------------- Wound Assessment Details Patient Name: GEROD, CALIGIURI. Date of Service: 12/03/2015 2:15 PM Medical Record Number: [id number] Patient Account Number: [id number] Date of Birth/Sex: [birth year] (65 y.o. Male) Treating RN: Phillis Haggis Primary Care Physician: Sherrie Mustache Other Clinician: Referring Physician: Sherrie Mustache Treating Physician/Extender: Rudene Re in Treatment: 0 Wound Status Wound Number: 4 Primary Venous Leg Ulcer Etiology: Wound Location: Left Lower Leg - Lateral, Distal Wound Open Wounding Event: Gradually Appeared Status: Date Acquired: 11/26/2015 Comorbid Chronic sinus problems/congestion, Weeks Of Treatment: 0 History: Chronic Obstructive Pulmonary Disease Clustered Wound: No (COPD), Sleep Apnea, Congestive Heart Failure, Hypertension, Myocardial Infarction, Type II Diabetes, Osteoarthritis, Confinement Anxiety Photos Photo Uploaded By: Alejandro Mulling on 12/03/2015 17:34:06 Wound Measurements Length: (cm) 2 Width: (cm) 1.5 Depth: (cm) 0.1 Area: (cm) 2.356 Volume: (cm) 0.236 % Reduction in Area: 0% % Reduction in Volume: 0% Epithelialization: None Tunneling: No Undermining: No Wound  Description Classification: Partial Thickness Diabetic Severity [address]): Grade 1 Wound Margin: Flat and Intact Exudate Amount: Large Exudate Type: Serous Exudate Color: amber Foul Odor After Cleansing: No Wound Bed Musquiz, Junius R. ([id number]) Granulation Amount: Small (1-33%) Exposed Structure Granulation Quality: Pink Fascia Exposed: No Necrotic Amount: Large (67-100%) Fat Layer Exposed: No Necrotic Quality: Adherent Slough Tendon Exposed: No Muscle Exposed: No Joint Exposed: No Bone Exposed: No Limited to Skin Breakdown Periwound Skin Texture Texture Color No Abnormalities Noted:  No No Abnormalities Noted: No Localized Edema: Yes Temperature / Pain Moisture Temperature: No Abnormality No Abnormalities Noted: No Tenderness on Palpation: Yes Moist: Yes Wound Preparation Ulcer Cleansing: Rinsed/Irrigated with Saline Treatment Notes Wound #4 (Left, Distal, Lateral Lower Leg) 1. Cleansed with: Clean wound with Normal Saline 2. Anesthetic Topical Lidocaine 4% cream to wound bed prior to debridement 4. Dressing Applied: Aquacel Ag 5. Secondary Dressing Applied ABD Pad 7. Secured with Field seismologist to Left Lower Extremity Notes Drawtex Electronic Signature(s) Signed: 12/03/2015 5:42:28 PM By: Alejandro Mulling Entered By: Alejandro Mulling on 12/03/2015 15:05:04 Medley, [name] ([id number]) -------------------------------------------------------------------------------- Vitals Details Patient Name: JERAMY, DIMMICK. Date of Service: 12/03/2015 2:15 PM Medical Record Number: [id number] Patient Account Number: [id number] Date of Birth/Sex: [birth year] (65 y.o. Male) Treating RN: Phillis Haggis Primary Care Physician: Sherrie Mustache Other Clinician: Referring Physician: Sherrie Mustache Treating Physician/Extender: Rudene Re in Treatment: 0 Vital Signs Time Taken: 14:31 Temperature (F): 97.7 Height (in): 66 Pulse (bpm): 77 Source: Stated Respiratory  Rate (breaths/min): 20 Weight (lbs): 360 Blood Pressure (mmHg): 142/73 Source: Stated Reference Range: 80 - 120 mg / dl Body Mass Index (BMI): 58.1 Electronic Signature(s) Signed: 12/03/2015 5:42:28 PM By: Alejandro Mulling Entered By: Alejandro Mulling on 12/03/2015 14:34:23

## 2015-12-10 ENCOUNTER — Encounter: Payer: Medicare Other | Attending: Internal Medicine | Admitting: Internal Medicine

## 2015-12-10 DIAGNOSIS — L97222 Non-pressure chronic ulcer of left calf with fat layer exposed: Secondary | ICD-10-CM | POA: Diagnosis not present

## 2015-12-10 DIAGNOSIS — J449 Chronic obstructive pulmonary disease, unspecified: Secondary | ICD-10-CM | POA: Insufficient documentation

## 2015-12-10 DIAGNOSIS — I252 Old myocardial infarction: Secondary | ICD-10-CM | POA: Diagnosis not present

## 2015-12-10 DIAGNOSIS — I5022 Chronic systolic (congestive) heart failure: Secondary | ICD-10-CM | POA: Diagnosis not present

## 2015-12-10 DIAGNOSIS — I87332 Chronic venous hypertension (idiopathic) with ulcer and inflammation of left lower extremity: Secondary | ICD-10-CM | POA: Insufficient documentation

## 2015-12-10 DIAGNOSIS — E0859 Diabetes mellitus due to underlying condition with other circulatory complications: Secondary | ICD-10-CM | POA: Diagnosis not present

## 2015-12-10 DIAGNOSIS — L97821 Non-pressure chronic ulcer of other part of left lower leg limited to breakdown of skin: Secondary | ICD-10-CM | POA: Diagnosis not present

## 2015-12-10 DIAGNOSIS — E785 Hyperlipidemia, unspecified: Secondary | ICD-10-CM | POA: Diagnosis not present

## 2015-12-10 DIAGNOSIS — I110 Hypertensive heart disease with heart failure: Secondary | ICD-10-CM | POA: Insufficient documentation

## 2015-12-10 DIAGNOSIS — E11622 Type 2 diabetes mellitus with other skin ulcer: Secondary | ICD-10-CM | POA: Diagnosis not present

## 2015-12-10 DIAGNOSIS — Z6841 Body Mass Index (BMI) 40.0 and over, adult: Secondary | ICD-10-CM | POA: Diagnosis not present

## 2015-12-10 DIAGNOSIS — G4730 Sleep apnea, unspecified: Secondary | ICD-10-CM | POA: Diagnosis not present

## 2015-12-10 DIAGNOSIS — L97221 Non-pressure chronic ulcer of left calf limited to breakdown of skin: Secondary | ICD-10-CM | POA: Diagnosis not present

## 2015-12-10 DIAGNOSIS — I890 Lymphedema, not elsewhere classified: Secondary | ICD-10-CM | POA: Insufficient documentation

## 2015-12-12 DIAGNOSIS — L97222 Non-pressure chronic ulcer of left calf with fat layer exposed: Secondary | ICD-10-CM | POA: Diagnosis not present

## 2015-12-12 DIAGNOSIS — I87332 Chronic venous hypertension (idiopathic) with ulcer and inflammation of left lower extremity: Secondary | ICD-10-CM | POA: Diagnosis not present

## 2015-12-12 DIAGNOSIS — E11622 Type 2 diabetes mellitus with other skin ulcer: Secondary | ICD-10-CM | POA: Diagnosis not present

## 2015-12-12 DIAGNOSIS — L97322 Non-pressure chronic ulcer of left ankle with fat layer exposed: Secondary | ICD-10-CM | POA: Diagnosis not present

## 2015-12-12 NOTE — Progress Notes (Signed)
ANGEL, WEEDON ([id number]) Visit Report for 12/10/2015 Chief Complaint Document Details Patient Name: [name], [name]. Date of Service: 12/10/2015 3:30 PM Medical Record Patient Account Number: [id number] Number: Treating RN: Clover Mealy, RN, BSN, Rita August 21, 1951 [phone] y.o. Other Clinician: Date of Birth/Sex: Male) Treating Ranesha Val Primary Care Physician/Extender: Kerry Kass, [facility] Physician: Referring Physician: Sherrie Mustache Weeks in Treatment: 1 Information Obtained from: Patient Chief Complaint Patient returns to the wound care center for reopened ulcer to: the left lower extremity with swelling. Electronic Signature(s) Signed: 12/10/2015 4:36:57 PM By: Baltazar Najjar MD Entered By: Baltazar Najjar on 12/10/2015 15:53:59 Frechette, [name] ([id number]) -------------------------------------------------------------------------------- HPI Details Patient Name: Ernest, [name]. Date of Service: 12/10/2015 3:30 PM Medical Record Patient Account Number: [id number] Number: Treating RN: Clover Mealy, RN, BSN, Rita 01-16-1951 [phone] y.o. Other Clinician: Date of Birth/Sex: Male) Treating Ly Wass Primary Care Physician/Extender: Kerry Kass, [facility] Physician: Referring Physician: Sherrie Mustache Weeks in Treatment: 1 History of Present Illness Location: left calf Quality: Patient reports experiencing a dull pain to affected area(s). Severity: Patient states wound are getting worse. Duration: Patient has had the wound for 1 months prior to seeking treatment at the wound center Timing: Pain in wound is Intermittent (comes and goes Context: The wound appeared gradually over time Associated Signs and Symptoms: Patient reports having difficulty standing for long periods. HPI Description: This 65 year old gentleman comes to see [place] for weeping of an ulcer on the left lower extremity. Other comorbidities include morbid obesity, hyperlipidemia, diabetes mellitus type 2, COPD,  hypertension, congestive heart failure and prostate problems. He was here in September of last year and was discharged after appropriate treatment for his condition and we had asked him to get vascular studies and he does say that he has had them done but does not know the results. He wears his compression stockings on and off but recently has not been wearing them because they get too tight. We have just obtained his results from the [facility] -- he was last seen there in October 2016 and at that stage his supportive facial thrombophlebitis on the left lower extremity had resolved and there was no venous reflux or venous disease seen on the right leg on his previous study. He was asked to continue with Plavix and elevation and exercise with compression stockings. No venous intervention would benefit him. We have also reviewed the reflux study done on September 15 for both lower extremities which showed no evidence of right lower extremity deep with thrombosis or superficial thrombophlebitis. No evidence of left lower extremity deep vein thrombosis but there was evidence of left lower extremity superficial thrombophlebitis. No incompetence of the greater small saphenous veins are noted bilaterally. 12/10/15; this is a patient who came here last week with an ulcer on his left lateral lower extremity and weeping edema. We have reviewed his venous studies which don't suggest the benefit of venous interventions. His ABI in the last this 1.07. He removed the Unna boot that we put on last week yesterday and there is considerable swelling in the left leg Electronic Signature(s) Signed: 12/10/2015 4:36:57 PM By: Baltazar Najjar MD Entered By: Baltazar Najjar on 12/10/2015 15:55:20 Duchesne, BARTOSZ LUGINBILL ([id number]) GAITHER, BIEHN ([id number]) -------------------------------------------------------------------------------- Physical Exam Details Patient Name: Ernest, [name]. Date  of Service: 12/10/2015 3:30 PM Medical Record Patient Account Number: [id number] Number: Treating RN: Clover Mealy, RN, BSN, Rita 19-Apr-1951 [phone] y.o. Other Clinician: Date of Birth/Sex: Male) Treating Sarahgrace Broman Primary Care Physician/Extender:  Kerry Kass, [facility] Physician: Referring Physician: Sherrie Mustache Weeks in Treatment: 1 Notes Wound exam; he has a fairly substantial wound on the left lower leg. The base of these appears fairly clean and no debridement was required. He does not have good edema control however as he remove the Unna boot yesterday. Electronic Signature(s) Signed: 12/10/2015 4:36:57 PM By: Baltazar Najjar MD Entered By: Baltazar Najjar on 12/10/2015 15:56:07 Schartz, [name] ([id number]) -------------------------------------------------------------------------------- Physician Orders Details Patient Name: Ernest, [name]. Date of Service: 12/10/2015 3:30 PM Medical Record Patient Account Number: [id number] Number: Treating RN: Clover Mealy, RN, BSN, Rita 1951-04-29 [phone] y.o. Other Clinician: Date of Birth/Sex: Male) Treating Wendee Hata Primary Care Physician/Extender: Kerry Kass, [facility] Physician: Referring Physician: Augustin Coupe in Treatment: 1 Verbal / Phone Orders: Yes Clinician: Afful, RN, BSN, Rita Read Back and Verified: Yes Diagnosis Coding Wound Cleansing Wound #3 Left,Proximal,Lateral Lower Leg o Clean wound with Normal Saline. Wound #4 Left,Distal,Lateral Lower Leg o Clean wound with Normal Saline. Primary Wound Dressing Wound #3 Left,Proximal,Lateral Lower Leg o Aquacel Ag Wound #4 Left,Distal,Lateral Lower Leg o Aquacel Ag Secondary Dressing Wound #3 Left,Proximal,Lateral Lower Leg o ABD pad o Drawtex Wound #4 Left,Distal,Lateral Lower Leg o ABD pad o Drawtex Dressing Change Frequency Wound #3 Left,Proximal,Lateral Lower Leg o Change dressing every week Wound #4 Left,Distal,Lateral Lower  Leg o Change dressing every week Follow-up Appointments Wound #3 Left,Proximal,Lateral Lower Leg o Return Appointment in 1 week. LIOR, HOEN ([id number]) Wound #4 Left,Distal,Lateral Lower Leg o Return Appointment in 1 week. Edema Control Wound #3 Left,Proximal,Lateral Lower Leg o Unna Boot to Left Lower Extremity o Elevate legs to the level of the heart and pump ankles as often as possible Wound #4 Left,Distal,Lateral Lower Leg o Unna Boot to Left Lower Extremity o Elevate legs to the level of the heart and pump ankles as often as possible Electronic Signature(s) Signed: 12/10/2015 4:36:57 PM By: Baltazar Najjar MD Signed: 12/11/2015 4:11:51 PM By: Elpidio Eric BSN, RN Entered By: Elpidio Eric on 12/10/2015 15:50:52 Banfield, [name] ([id number]) -------------------------------------------------------------------------------- Problem List Details Patient Name: KEYSHAWN, HELLWIG. Date of Service: 12/10/2015 3:30 PM Medical Record Patient Account Number: [id number] Number: Treating RN: Clover Mealy, RN, BSN, Rita Apr 05, 1951 [phone] y.o. 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 Date of Service: 12/10/2015 3:30 PM Medical Record Patient Account Number: [id number] Number: Treating RN: Clover Mealy, RN, BSN, Rita 1951-07-22 [phone] y.o. Other Clinician: Date of Birth/Sex: Male) Treating Marciana Uplinger Primary Care Physician/Extender: Kerry Kass, [facility] Physician: Referring Physician: Sherrie Mustache Weeks in Treatment: 1 Plan #1 so over alginate to the wound area, gauze under an Unna boot. #2 I have asked him to leave the Unna boot in place until next Wednesday morning and then he can  remove it showering come into the facility along with his wife. He asked about having home health change this once and I don't really have a problem with this as long as there is a skilled nurse to put on the unit Electronic Signature(s) Signed: 12/10/2015 4:36:57 PM By: Baltazar Najjar MD Entered By: Baltazar Najjar on 12/10/2015 15:56:54 Gallardo, [name] ([id number]) -------------------------------------------------------------------------------- SuperBill Details Patient Name: [name]. Date of Service: 12/10/2015 Medical Record Patient Account Number: [id number] Number: Treating RN: Clover Mealy, RN, BSN, Rita 21-Oct-1951 [phone] y.o. Other Clinician: Date of Birth/Sex: Male) Treating Tarisa Paola Primary Care Physician: Sherrie Mustache Physician/Extender: G Referring Physician: Sherrie Mustache Weeks in Treatment: 1 Diagnosis Coding ICD-10 Codes Code Description E11.622 Type 2 diabetes mellitus with other skin ulcer E08.59 Diabetes mellitus due to underlying condition with other circulatory complications L97.222 Non-pressure chronic ulcer of left calf with fat layer exposed I89.0 Lymphedema, not elsewhere classified Chronic venous  hypertension (idiopathic) with ulcer and inflammation of left lower I87.332 extremity I50.22 Chronic systolic (congestive) heart failure Facility Procedures CPT4 Code: [id number] Description: (Facility Use Only) 29580LT - APPLY UNNA BOOT LT Modifier: Quantity: 1 Physician Procedures CPT4: Description Modifier Quantity Code [id number] - WC PHYS LEVEL 2 - EST PT 1 ICD-10 Description Diagnosis 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

## 2015-12-12 NOTE — Progress Notes (Signed)
[name] ([id number]) Visit Report for 12/10/2015 Arrival Information Details Patient Name: [name], [name]. Date of Service: 12/10/2015 3:30 PM Medical Record Patient Account Number: [id number] Number: Treating RN: Ernest Mealy, RN, BSN, [name] 1950/12/19 [phone] y.o. Other Clinician: Date of Birth/Sex: Male) Treating [name] Primary Care Physician: [name] Physician/Extender: G Referring Physician: Sherrie [name] Weeks in Treatment: 1 Visit Information History Since Last Visit Added or deleted any medications: No Patient Arrived: Wheel Chair Any new allergies or adverse reactions: No Arrival Time: 15:19 Had a fall or experienced change in No Accompanied By: self activities of daily living that may affect Transfer Assistance: None risk of falls: Patient Identification Verified: Yes Signs or symptoms of abuse/neglect since last No Secondary Verification Process Yes visito Completed: Hospitalized since last visit: No Patient Requires Transmission- No Has Dressing in Place as Prescribed: Yes Based Precautions: Has Compression in Place as Prescribed: Yes Patient Has Alerts: Yes Pain Present Now: No Patient Alerts: Patient on Blood Thinner Plavix ASA DM II Electronic Signature(s) Signed: 12/11/2015 4:11:51 PM By: [name] BSN, RN Entered By: [name] on 12/10/2015 15:24:58 Boutelle, [name] ([id number]) -------------------------------------------------------------------------------- Encounter Discharge Information Details Patient Name: [name], JUHASZ [name]. Date of Service: 12/10/2015 3:30 PM Medical Record Patient Account Number: [id number] Number: Treating RN: Ernest Mealy, RN, BSN, [name] [birth year] [phone] y.o. Other Clinician: Date of Birth/Sex: Male) Treating [name] Primary Care Physician: [name] Physician/Extender: G Referring Physician: Sherrie [name] Weeks in Treatment: 1 Encounter Discharge Information Items Schedule Follow-up  Appointment: No Medication Reconciliation completed No and provided to Patient/Care [name]: Provided on Clinical Summary of Care: 12/10/2015 Form Type Recipient Paper Patient GW Electronic Signature(s) Signed: 12/10/2015 4:00:54 PM By: [name] Entered By: [name] on 12/10/2015 16:00:53 Blundell, [name] ([id number]) -------------------------------------------------------------------------------- Lower Extremity Assessment Details Patient Name: [name], [name]. Date of Service: 12/10/2015 3:30 PM Medical Record Patient Account Number: [id number] Number: Treating RN: Ernest Mealy, RN, BSN, [name] Sep 02, 1951 [phone] y.o. Other Clinician: Date of Birth/Sex: Male) Treating [name] Primary Care Physician: [name] Physician/Extender: G Referring Physician: Sherrie [name] Weeks in Treatment: 1 Edema Assessment Assessed: [Left: No] [Right: No] E[Left: dema] [Right: :] Calf Left: Right: Point of Measurement: 37 cm From Medial Instep 57.8 cm cm Ankle Left: Right: Point of Measurement: 12 cm From Medial Instep 36.5 cm cm Vascular Assessment Pulses: Posterior Tibial Dorsalis Pedis Palpable: [Left:Yes] Extremity colors, hair growth, and conditions: Extremity Color: [Left:Dusky] Hair Growth on Extremity: [Left:No] Temperature of Extremity: [Left:Warm] Capillary Refill: [Left:< 3 seconds] Toe Nail Assessment Left: Right: Thick: Yes Discolored: Yes Deformed: Yes Improper Length and Hygiene: Yes Electronic Signature(s) Signed: 12/11/2015 4:11:51 PM By: [name] BSN, RN Entered By: [name] on 12/10/2015 15:26:34 [name] ([id number]) [name], [name] Kitchen ([id number]) -------------------------------------------------------------------------------- Multi Wound Chart Details Patient Name: [name], [name] [name]. Date of Service: 12/10/2015 3:30 PM Medical Record Patient Account Number: [id number] Number: Treating RN: Ernest Mealy, RN, BSN, [name] 01/15/1951  [phone] y.o. Other Clinician: Date of Birth/Sex: Male) Treating [name] Primary Care Physician: [name] Physician/Extender: G Referring Physician: Sherrie [name] Weeks in Treatment: 1 Vital Signs Height(in): 66 Pulse(bpm): 77 Weight(lbs): 360 Blood Pressure (mmHg): Body Mass Index(BMI): 58 Temperature(F): 97.8 Respiratory Rate 20 (breaths/min): Photos: [3:No Photos] [4:No Photos] [N/A:N/A] Wound Location: [3:Left Lower Leg - Lateral, Proximal] [4:Left Lower Leg - Lateral, Distal] [N/A:N/A] Wounding Event: [3:Gradually Appeared] [4:Gradually Appeared] [N/A:N/A] Primary Etiology: [3:To be determined] [4:Venous Leg Ulcer] [N/A:N/A] Comorbid History: [3:Chronic sinus problems/congestion, Chronic Obstructive Pulmonary Disease (COPD),  Sleep Apnea, Congestive Heart Failure, Hypertension, Myocardial Infarction, Type II Diabetes, Osteoarthritis, Confinement Anxiety] [4:Chronic sinus  problems/congestion, Chronic Obstructive Pulmonary Disease (COPD), Sleep Apnea, Congestive Heart Failure, Hypertension, Myocardial Infarction, Type II Diabetes, Osteoarthritis, Confinement Anxiety] [N/A:N/A] Date Acquired: [3:11/26/2015] [4:11/26/2015] [N/A:N/A] Weeks of Treatment: [3:1] [4:1] [N/A:N/A] Wound Status: [3:Open] [4:Open] [N/A:N/A] Measurements L x W x D 7x4x0.1 [4:2x2x0.1] [N/A:N/A] (cm) Area (cm) : [3:21.991] [4:3.142] [N/A:N/A] Volume (cm) : [3:2.199] [4:0.314] [N/A:N/A] % Reduction in Area: [3:0.00%] [4:-33.40%] [N/A:N/A] % Reduction in Volume: 0.00% [4:-33.10%] [N/A:N/A] Classification: [3:Partial Thickness] [4:Partial Thickness] [N/A:N/A] HBO Classification: [3:Grade 1] [4:Grade 1] [N/A:N/A] Exudate Amount: [3:Large] [4:Large] [N/A:N/A] Exudate Type: [3:Serous] [4:Serous] [N/A:N/A] Exudate Color: amber amber N/A Wound Margin: Flat and Intact Flat and Intact N/A Granulation Amount: Small (1-33%) Small (1-33%) N/A Granulation Quality: Pink Pink N/A Necrotic Amount: Large  (67-100%) Large (67-100%) N/A Exposed Structures: Fascia: No Fascia: No N/A Fat: No Fat: No Tendon: No Tendon: No Muscle: No Muscle: No Joint: No Joint: No Bone: No Bone: No Limited to Skin Limited to Skin Breakdown Breakdown Epithelialization: None None N/A Periwound Skin Texture: Edema: Yes Edema: No N/A Scarring: Yes Excoriation: No Excoriation: No Induration: No Induration: No Callus: No Callus: No Crepitus: No Crepitus: No Fluctuance: No Fluctuance: No Friable: No Friable: No Rash: No Rash: No Scarring: No Periwound Skin Moist: Yes Moist: Yes N/A Moisture: Dry/Scaly: Yes Maceration: No Maceration: No Dry/Scaly: No Periwound Skin Color: Atrophie Blanche: No Atrophie Blanche: No N/A Cyanosis: No Cyanosis: No Ecchymosis: No Ecchymosis: No Erythema: No Erythema: No Hemosiderin Staining: No Hemosiderin Staining: No Mottled: No Mottled: No Pallor: No Pallor: No Rubor: No Rubor: No Temperature: No Abnormality No Abnormality N/A Tenderness on No Yes N/A Palpation: Wound Preparation: Ulcer Cleansing: Ulcer Cleansing: N/A Rinsed/Irrigated with Rinsed/Irrigated with Saline Saline Topical Anesthetic Topical Anesthetic Applied: Other: lidocaine Applied: None 4% Treatment Notes Electronic Signature(s) Signed: 12/11/2015 4:11:51 PM By: [name] BSN, RN [address], Rickie [name]. ([id number]) Entered By: [name] on 12/10/2015 15:50:27 Gingras, [name] ([id number]) -------------------------------------------------------------------------------- Multi-Disciplinary Care Plan Details Patient Name: [name], [name]. Date of Service: 12/10/2015 3:30 PM Medical Record Patient Account Number: [id number] Number: Treating RN: Ernest Mealy, RN, BSN, [name] 1951/10/02 [phone] y.o. Other Clinician: Date of Birth/Sex: Male) Treating [name] Primary Care Physician: [name] Physician/Extender: G Referring Physician: Sherrie [name] Weeks in Treatment:  1 Active Inactive Abuse / Safety / Falls / Self Care Management Nursing Diagnoses: Potential for falls Goals: Patient will remain injury free Date Initiated: 12/03/2015 Goal Status: Active Interventions: Assess fall risk on admission and as needed Notes: Nutrition Nursing Diagnoses: Imbalanced nutrition Goals: Patient/caregiver will maintain therapeutic glucose control Date Initiated: 12/03/2015 Goal Status: Active Interventions: Assess patient nutrition upon admission and as needed per policy Notes: Orientation to the Wound Care Program Nursing Diagnoses: Knowledge deficit related to the wound healing center program Goals: SHIA, EBER ([id number]) Patient/caregiver will verbalize understanding of the Wound Healing Center Program Date Initiated: 12/03/2015 Goal Status: Active Interventions: Provide education on orientation to the wound center Notes: Pain, Acute or Chronic Nursing Diagnoses: Pain, acute or chronic: actual or potential Potential alteration in comfort, pain Goals: Patient will verbalize adequate pain control and receive pain control interventions during procedures as needed Date Initiated: 12/03/2015 Goal Status: Active Interventions: Assess comfort goal upon admission Notes: Wound/Skin Impairment Nursing Diagnoses: Impaired tissue integrity Goals: Ulcer/skin breakdown will have a volume reduction of 30% by week 4 Date Initiated: 12/03/2015 Goal Status: Active Ulcer/skin breakdown will have a volume reduction of  50% by week 8 Date Initiated: 12/03/2015 Goal Status: Active Ulcer/skin breakdown will have a volume reduction of 80% by week 12 Date Initiated: 12/03/2015 Goal Status: Active Interventions: Assess patient/caregiver ability to obtain necessary supplies Notes: [name], [name] ([id number]) Electronic Signature(s) Signed: 12/11/2015 4:11:51 PM By: [name] BSN, RN Entered By: [name] on 12/10/2015 15:50:18 Anschutz, [name]  ([id number]) -------------------------------------------------------------------------------- Pain Assessment Details Patient Name: [name], [name]. Date of Service: 12/10/2015 3:30 PM Medical Record Patient Account Number: [id number] Number: Treating RN: Ernest Mealy, RN, BSN, [name] 05-09-1951 [phone] y.o. Other Clinician: Date of Birth/Sex: Male) Treating [name] Primary Care Physician: [name] Physician/Extender: G Referring Physician: Sherrie [name] Weeks in Treatment: 1 Active Problems Location of Pain Severity and Description of Pain Patient Has Paino No Site Locations Pain Management and Medication Current Pain Management: Electronic Signature(s) Signed: 12/11/2015 4:11:51 PM By: [name] BSN, RN Entered By: [name] on 12/10/2015 15:25:08 Deharo, [name] ([id number]) -------------------------------------------------------------------------------- Wound Assessment Details Patient Name: [name], [name] [name]. Date of Service: 12/10/2015 3:30 PM Medical Record Patient Account Number: [id number] Number: Treating RN: Ernest Mealy, RN, BSN, [name] 1951/04/09 [phone] y.o. Other Clinician: Date of Birth/Sex: Male) Treating [name] Primary Care Physician: [name] Physician/Extender: G Referring Physician: Sherrie [name] Weeks in Treatment: 1 Wound Status Wound Number: 3 Primary To be determined Etiology: Wound Location: Left Lower Leg - Lateral, Proximal Wound Open Status: Wounding Event: Gradually Appeared Comorbid Chronic sinus problems/congestion, Date Acquired: 11/26/2015 History: Chronic Obstructive Pulmonary Disease Weeks Of Treatment: 1 (COPD), Sleep Apnea, Congestive Heart Clustered Wound: No Failure, Hypertension, Myocardial Infarction, Type II Diabetes, Osteoarthritis, Confinement Anxiety Photos Photo Uploaded By: [name] on 12/10/2015 17:09:03 Wound Measurements Length: (cm) 7 % Reduction in Width: (cm) 4 % Reduction in Depth:  (cm) 0.1 Epithelializat Area: (cm) 21.991 Tunneling: Volume: (cm) 2.199 Undermining: Area: 0% Volume: 0% ion: None No No Wound Description ADONI, GREENOUGH ([id number]) Classification: Partial Thickness Foul Odor After Cleansing: No Diabetic Severity [address]): Grade 1 Wound Margin: Flat and Intact Exudate Amount: Large Exudate Type: Serous Exudate Color: amber Wound Bed Granulation Amount: Small (1-33%) Exposed Structure Granulation Quality: Pink Fascia Exposed: No Necrotic Amount: Large (67-100%) Fat Layer Exposed: No Necrotic Quality: Adherent Slough Tendon Exposed: No Muscle Exposed: No Joint Exposed: No Bone Exposed: No Limited to Skin Breakdown Periwound Skin Texture Texture Color No Abnormalities Noted: No No Abnormalities Noted: No Callus: No Atrophie Blanche: No Crepitus: No Cyanosis: No Excoriation: No Ecchymosis: No Fluctuance: No Erythema: No Friable: No Hemosiderin Staining: No Induration: No Mottled: No Localized Edema: Yes Pallor: No Rash: No Rubor: No Scarring: Yes Temperature / Pain Moisture Temperature: No Abnormality No Abnormalities Noted: No Dry / Scaly: Yes Maceration: No Moist: Yes Wound Preparation Ulcer Cleansing: Rinsed/Irrigated with Saline Topical Anesthetic Applied: Other: lidocaine 4%, Electronic Signature(s) Signed: 12/10/2015 3:41:22 PM By: [name] BSN, RN Entered By: [name] on 12/10/2015 15:41:22 Jaskot, [name] ([id number]) -------------------------------------------------------------------------------- Wound Assessment Details Patient Name: [name], [name] [name]. Date of Service: 12/10/2015 3:30 PM Medical Record Patient Account Number: [id number] Number: Treating RN: Ernest Mealy, RN, BSN, [name] 30-Nov-1950 [phone] y.o. Other Clinician: Date of Birth/Sex: Male) Treating [name] Primary Care Physician: [name] Physician/Extender: G Referring Physician: Sherrie [name] Weeks in Treatment:  1 Wound Status Wound Number: 4 Primary Venous Leg Ulcer Etiology: Wound Location: Left Lower Leg - Lateral, Distal Wound Open Wounding Event: Gradually Appeared Status: Date Acquired: 11/26/2015 Comorbid Chronic sinus problems/congestion, Weeks Of Treatment: 1 History: Chronic Obstructive Pulmonary  Disease Clustered Wound: No (COPD), Sleep Apnea, Congestive Heart Failure, Hypertension, Myocardial Infarction, Type II Diabetes, Osteoarthritis, Confinement Anxiety Photos Photo Uploaded By: [name] on 12/10/2015 17:09:03 Wound Measurements Length: (cm) 2 Width: (cm) 2 Depth: (cm) 0.1 Area: (cm) 3.142 Volume: (cm) 0.314 % Reduction in Area: -33.4% % Reduction in Volume: -33.1% Epithelialization: None Tunneling: No Undermining: No Wound Description [name], [name] ([id number]) Classification: Partial Thickness Foul Odor After Cleansing: No Diabetic Severity [address]): Grade 1 Wound Margin: Flat and Intact Exudate Amount: Large Exudate Type: Serous Exudate Color: amber Wound Bed Granulation Amount: Small (1-33%) Exposed Structure Granulation Quality: Pink Fascia Exposed: No Necrotic Amount: Large (67-100%) Fat Layer Exposed: No Necrotic Quality: Adherent Slough Tendon Exposed: No Muscle Exposed: No Joint Exposed: No Bone Exposed: No Limited to Skin Breakdown Periwound Skin Texture Texture Color No Abnormalities Noted: No No Abnormalities Noted: No Callus: No Atrophie Blanche: No Crepitus: No Cyanosis: No Excoriation: No Ecchymosis: No Fluctuance: No Erythema: No Friable: No Hemosiderin Staining: No Induration: No Mottled: No Localized Edema: No Pallor: No Rash: No Rubor: No Scarring: No Temperature / Pain Moisture Temperature: No Abnormality No Abnormalities Noted: No Tenderness on Palpation: Yes Dry / Scaly: No Maceration: No Moist: Yes Wound Preparation Ulcer Cleansing: Rinsed/Irrigated with Saline Topical Anesthetic Applied:  None Electronic Signature(s) Signed: 12/10/2015 3:41:53 PM By: [name] BSN, RN Entered By: [name] on 12/10/2015 15:41:53 Agner, [name] ([id number]) -------------------------------------------------------------------------------- Vitals Details Patient Name: [name]. Date of Service: 12/10/2015 3:30 PM Medical Record Patient Account Number: [id number] Number: Treating RN: Ernest Mealy, RN, BSN, [name] 06-Mar-1951 [phone] y.o. Other Clinician: Date of Birth/Sex: Male) Treating [name] Primary Care Physician: [name] Physician/Extender: G Referring Physician: Sherrie [name] Weeks in Treatment: 1 Vital Signs Time Taken: 13:25 Temperature (F): 97.8 Height (in): 66 Pulse (bpm): 77 Weight (lbs): 360 Respiratory Rate (breaths/min): 20 Body Mass Index (BMI): 58.1 Reference Range: 80 - 120 mg / dl Electronic Signature(s) Signed: 12/11/2015 4:11:51 PM By: [name] BSN, RN Entered By: [name] on 12/10/2015 15:25:56

## 2015-12-13 DIAGNOSIS — I509 Heart failure, unspecified: Secondary | ICD-10-CM | POA: Diagnosis not present

## 2015-12-16 DIAGNOSIS — L97222 Non-pressure chronic ulcer of left calf with fat layer exposed: Secondary | ICD-10-CM | POA: Diagnosis not present

## 2015-12-16 DIAGNOSIS — L97322 Non-pressure chronic ulcer of left ankle with fat layer exposed: Secondary | ICD-10-CM | POA: Diagnosis not present

## 2015-12-16 DIAGNOSIS — I87332 Chronic venous hypertension (idiopathic) with ulcer and inflammation of left lower extremity: Secondary | ICD-10-CM | POA: Diagnosis not present

## 2015-12-16 DIAGNOSIS — E11622 Type 2 diabetes mellitus with other skin ulcer: Secondary | ICD-10-CM | POA: Diagnosis not present

## 2015-12-17 ENCOUNTER — Encounter: Payer: Medicare Other | Admitting: Internal Medicine

## 2015-12-17 DIAGNOSIS — L97222 Non-pressure chronic ulcer of left calf with fat layer exposed: Secondary | ICD-10-CM | POA: Diagnosis not present

## 2015-12-17 DIAGNOSIS — J449 Chronic obstructive pulmonary disease, unspecified: Secondary | ICD-10-CM | POA: Diagnosis not present

## 2015-12-17 DIAGNOSIS — L97221 Non-pressure chronic ulcer of left calf limited to breakdown of skin: Secondary | ICD-10-CM | POA: Diagnosis not present

## 2015-12-17 DIAGNOSIS — I110 Hypertensive heart disease with heart failure: Secondary | ICD-10-CM | POA: Diagnosis not present

## 2015-12-17 DIAGNOSIS — I252 Old myocardial infarction: Secondary | ICD-10-CM | POA: Diagnosis not present

## 2015-12-17 DIAGNOSIS — E0859 Diabetes mellitus due to underlying condition with other circulatory complications: Secondary | ICD-10-CM | POA: Diagnosis not present

## 2015-12-17 DIAGNOSIS — I87332 Chronic venous hypertension (idiopathic) with ulcer and inflammation of left lower extremity: Secondary | ICD-10-CM | POA: Diagnosis not present

## 2015-12-17 DIAGNOSIS — I890 Lymphedema, not elsewhere classified: Secondary | ICD-10-CM | POA: Diagnosis not present

## 2015-12-17 DIAGNOSIS — I5022 Chronic systolic (congestive) heart failure: Secondary | ICD-10-CM | POA: Diagnosis not present

## 2015-12-17 DIAGNOSIS — E11622 Type 2 diabetes mellitus with other skin ulcer: Secondary | ICD-10-CM | POA: Diagnosis not present

## 2015-12-17 DIAGNOSIS — E785 Hyperlipidemia, unspecified: Secondary | ICD-10-CM | POA: Diagnosis not present

## 2015-12-17 DIAGNOSIS — G4730 Sleep apnea, unspecified: Secondary | ICD-10-CM | POA: Diagnosis not present

## 2015-12-17 DIAGNOSIS — L97821 Non-pressure chronic ulcer of other part of left lower leg limited to breakdown of skin: Secondary | ICD-10-CM | POA: Diagnosis not present

## 2015-12-19 NOTE — Progress Notes (Signed)
[name] ([id number]) Visit Report for 12/17/2015 Chief Complaint Document Details Patient Name: [name], [name]. Date of Service: 12/17/2015 2:15 PM Medical Record Patient Account Number: [id number] Number: Treating RN: Phillis Haggis Jun 22, 1951 (64 y.o. Other Clinician: Date of Birth/Sex: Male) Treating Anette Barra Primary Care Physician/Extender: Kerry Kass, [facility] Physician: Referring Physician: Sherrie Mustache Weeks in Treatment: 2 Information Obtained from: Patient Chief Complaint Patient returns to the wound care center for reopened ulcer to: the left lower extremity with swelling. Electronic Signature(s) Signed: 12/17/2015 5:51:40 PM By: Baltazar Najjar MD Entered By: Baltazar Najjar on 12/17/2015 17:07:59 [name] ([id number]) -------------------------------------------------------------------------------- HPI Details Patient Name: [name], [name]. Date of Service: 12/17/2015 2:15 PM Medical Record Patient Account Number: [id number] Number: Treating RN: Phillis Haggis 1951/10/18 (64 y.o. Other Clinician: Date of Birth/Sex: Male) Treating Alek Poncedeleon Primary Care Physician/Extender: Kerry Kass, [facility] Physician: Referring Physician: Sherrie Mustache Weeks in Treatment: 2 History of Present Illness Location: left calf Quality: Patient reports experiencing a dull pain to affected area(s). Severity: Patient states wound are getting worse. Duration: Patient has had the wound for 1 months prior to seeking treatment at the wound center Timing: Pain in wound is Intermittent (comes and goes Context: The wound appeared gradually over time Associated Signs and Symptoms: Patient reports having difficulty standing for long periods. HPI Description: This 65 year old gentleman comes to see [place] for weeping of an ulcer on the left lower extremity. Other comorbidities include morbid obesity, hyperlipidemia, diabetes mellitus type 2, COPD,  hypertension, congestive heart failure and prostate problems. He was here in September of last year and was discharged after appropriate treatment for his condition and we had asked him to get vascular studies and he does say that he has had them done but does not know the results. He wears his compression stockings on and off but recently has not been wearing them because they get too tight. We have just obtained his results from the [place] [facility] -- he was last seen there in October 2016 and at that stage his supportive facial thrombophlebitis on the left lower extremity had resolved and there was no venous reflux or venous disease seen on the right leg on his previous study. He was asked to continue with Plavix and elevation and exercise with compression stockings. No venous intervention would benefit him. We have also reviewed the reflux study done on September 15 for both lower extremities which showed no evidence of right lower extremity deep with thrombosis or superficial thrombophlebitis. No evidence of left lower extremity deep vein thrombosis but there was evidence of left lower extremity superficial thrombophlebitis. No incompetence of the greater small saphenous veins are noted bilaterally. 12/10/15; this is a patient who came here last week with an ulcer on his left lateral lower extremity and weeping edema. We have reviewed his venous studies which don't suggest the benefit of venous interventions. His ABI in the last this 1.07. He removed the [employer] boot that we put on last week yesterday and there is considerable swelling in the left leg 12/17/15 the patient returns to clinic today with the wounds on the left lateral calf completely resolved. It would appear that during a visit to this clinic last time he did have graded pressure stockings prescribed however he cannot get these on. After some discussion we decided to order him juzzo stockings Electronic  Signature(s) DAVID, RODRIQUEZ ([id number]) Signed: 12/17/2015 5:51:40 PM By: Baltazar Najjar MD Entered By: Baltazar Najjar on 12/17/2015 17:09:58  [name], [name] ([id number]) -------------------------------------------------------------------------------- Physical Exam Details Patient Name: [name], [name]. Date of Service: 12/17/2015 2:15 PM Medical Record Patient AccNICHOLES, [name] Number: Treating RN: Phillis Haggis October 10, 1951 (64 y.o. Other Clinician: Date of Birth/Sex: Male) Treating Chasidy Janak Primary Care Physician/Extender: Kerry Kass, [facility] Physician: Referring Physician: Sherrie Mustache Weeks in Treatment: 2 Notes Wound exam; the wound on his left lower leg which was fairly substantial but superficial last week as completely closed over. It would appear that this is largely a issue with controlled edema in his legs. Without edema control he is likely to be a recurrent patient. Electronic Signature(s) Signed: 12/17/2015 5:51:40 PM By: Baltazar Najjar MD Entered By: Baltazar Najjar on 12/17/2015 17:11:07 [name] ([id number]) -------------------------------------------------------------------------------- Physician Orders Details Patient Name: [name], [name]. Date of Service: 12/17/2015 2:15 PM Medical Record Patient Account Number: [id number] Number: Treating RN: Phillis Haggis May 11, 1951 (64 y.o. Other Clinician: Date of Birth/Sex: Male) Treating Marilin Kofman Primary Care Physician/Extender: Kerry Kass, [facility] Physician: Referring Physician: Sherrie Mustache Weeks in Treatment: 2 Verbal / Phone Orders: No Diagnosis Coding Discharge From [facility] Services o Discharge from Wound Care Center - Office to order Juzos compression wraps for bilateral legs Electronic Signature(s) Signed: 12/17/2015 5:51:40 PM By: Baltazar Najjar MD Signed: 12/18/2015 5:51:12 PM By: Alejandro Mulling Entered By: Alejandro Mulling on 12/17/2015 15:23:46 [name],  [name] ([id number]) -------------------------------------------------------------------------------- Problem List Details Patient Name: [name], [name]. Date of Service: 12/17/2015 2:15 PM Medical Record Patient Account Number: [id number] Number: Treating RN: Phillis Haggis 1951/04/22 (64 y.o. 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 Date of Service: 12/17/2015 2:15 PM Medical Record Patient Account Number: [id number] Number: Treating RN: Phillis Haggis [birth year] (64 y.o. Other Clinician: Date of Birth/Sex: Male) Treating Truly Stankiewicz Primary Care Physician/Extender: Kerry Kass, [facility] Physician: Referring Physician: Sherrie Mustache Weeks in Treatment: 2 Subjective Chief Complaint Information obtained from Patient Patient returns to the wound care center for reopened ulcer to: the left lower extremity with swelling. History  of Present Illness (HPI) The following HPI elements were documented for the patient's wound: Location: left calf Quality: Patient reports experiencing a dull pain to affected area(s). Severity: Patient states wound are getting worse. Duration: Patient has had the wound for 1 months prior to seeking treatment at the wound center Timing: Pain in wound is Intermittent (comes and goes Context: The wound appeared gradually over time Associated Signs and Symptoms: Patient reports having difficulty standing for long periods. This 65 year old gentleman comes to see [place] for weeping of an ulcer on the left lower extremity. Other comorbidities include morbid obesity, hyperlipidemia, diabetes mellitus type 2, COPD, hypertension, congestive heart failure and prostate problems. He was here in September of last year and was discharged after appropriate treatment  for his condition and we had asked him to get vascular studies and he does say that he has had them done but does not know the results. He wears his compression stockings on and off but recently has not been wearing them because they get too tight. We have just obtained his results from the [place] [facility] -- he was last seen there in October 2016 and at that stage his supportive facial thrombophlebitis on the left lower extremity had resolved and there was no venous reflux or venous disease seen on the right leg on his previous study. He was asked to continue with Plavix and elevation and exercise with compression stockings. No venous intervention would benefit him. We have also reviewed the reflux study done on September 15 for both lower extremities which showed no evidence of right lower extremity deep with thrombosis or superficial thrombophlebitis. No evidence of left lower extremity deep vein thrombosis but there was evidence of left lower extremity superficial thrombophlebitis. No incompetence of the greater small saphenous  veins are noted bilaterally. 12/10/15; this is a patient who came here last week with an ulcer on his left lateral lower extremity and weeping edema. We have reviewed his venous studies which don't suggest the benefit of venous interventions. His ABI in the last this 1.07. He removed the [employer] that we put on last week yesterday [name], [name]. ([id number]) and there is considerable swelling in the left leg 12/17/15 the patient returns to clinic today with the wounds on the left lateral calf completely resolved. It would appear that during a visit to this clinic last time he did have graded pressure stockings prescribed however he cannot get these on. After some discussion we decided to order him juzzo stockings Objective Constitutional Vitals Time Taken: 3:00 PM, Height: 66 in, Weight: 360 lbs, BMI: 58.1, Temperature: 98.1 F, Pulse: 77 bpm, Respiratory Rate: 20 breaths/min, Blood Pressure: 156/91 mmHg. Integumentary (Hair, Skin) Wound #3 status is Open. Original cause of wound was Gradually Appeared. The wound is located on the Left,Proximal,Lateral Lower Leg. The wound measures 0cm length x 0cm width x 0cm depth; 0cm^2 area and 0cm^3 volume. The wound is limited to skin breakdown. There is no tunneling or undermining noted. There is a none present amount of drainage noted. The wound margin is flat and intact. There is no granulation within the wound bed. There is no necrotic tissue within the wound bed. The periwound skin appearance had no abnormalities noted for texture. The periwound skin appearance had no abnormalities noted for moisture. The periwound skin appearance did not exhibit: Atrophie Blanche, Cyanosis, Ecchymosis, Hemosiderin Staining, Mottled, Pallor, Rubor, Erythema. Periwound temperature was noted as No Abnormality. Wound #4 status is Open. Original cause of wound was Gradually Appeared. The wound is located on the Left,Distal,Lateral Lower Leg. The wound measures 0cm  length x 0cm width x 0cm depth; 0cm^2 area and 0cm^3 volume. The wound is limited to skin breakdown. There is no tunneling or undermining noted. There is a none present amount of drainage noted. The wound margin is flat and intact. There is no granulation within the wound bed. There is a large (67-100%) amount of necrotic tissue within the wound bed including Adherent Slough. The periwound skin appearance did not exhibit: Callus, Crepitus, Excoriation, Fluctuance, Friable, Induration, Localized Edema, Rash, Scarring, Dry/Scaly, Maceration, Moist, Atrophie Blanche, Cyanosis, Ecchymosis, Hemosiderin Staining, Mottled, Pallor, Rubor, Erythema. Periwound temperature was noted as No Abnormality. The periwound has tenderness on palpation. Assessment Active  Problems ICD-10 E11.622 - Type 2 diabetes mellitus with other skin ulcer E08.59 - Diabetes mellitus due to underlying condition with other circulatory complications L97.222 - Non-pressure chronic ulcer of left calf with fat layer exposed I89.0 - Lymphedema, not elsewhere classified [name], [name] ([id number]) I87.332 - Chronic venous hypertension (idiopathic) with ulcer and inflammation of left lower extremity I50.22 - Chronic systolic (congestive) heart failure Plan Discharge From [facility] Services: Discharge from Wound Care Center - Office to order Juzos compression wraps for bilateral legs #1 think the patient can be discharged from the clinic at this point #2 we discussed at some length alternatives to the 20/30 millimeter hg gradient pressure stockings he already has. The patient has agreed to juzzo stockings which she'll pay for privately. Electronic Signature(s) Signed: 12/17/2015 5:51:40 PM By: Baltazar Najjar MD Entered By: Baltazar Najjar on 12/17/2015 17:12:12 [place], [name] ([id number]) -------------------------------------------------------------------------------- SuperBill Details Patient Name: [name], [name]. Date of Service:  12/17/2015 Medical Record Patient Account Number: [id number] Number: Treating RN: Phillis Haggis [birth year] (64 y.o. Other Clinician: Date of Birth/Sex: Male) Treating Vonya Ohalloran Primary Care Physician: Sherrie Mustache Physician/Extender: G Referring Physician: Sherrie Mustache Weeks in Treatment: 2 Diagnosis Coding ICD-10 Codes Code Description E11.622 Type 2 diabetes mellitus with other skin ulcer E08.59 Diabetes mellitus due to underlying condition with other circulatory complications L97.222 Non-pressure chronic ulcer of left calf with fat layer exposed I89.0 Lymphedema, not elsewhere classified Chronic venous hypertension (idiopathic) with ulcer and inflammation of left lower I87.332 extremity I50.22 Chronic systolic (congestive) heart failure Facility Procedures CPT4 Code: [id number] Description: 30865 - WOUND CARE VISIT-LEV 3 EST PT Modifier: Quantity: 1 Physician Procedures CPT4: Description Modifier Quantity Code [id number] - WC PHYS LEVEL 2 - EST PT 1 ICD-10 Description Diagnosis 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

## 2015-12-20 NOTE — Progress Notes (Signed)
SEDERICK, JACOBSEN ([id number]) Visit Report for 12/17/2015 Arrival Information Details Patient Name: [name], [name]. Date of Service: 12/17/2015 2:15 PM Medical Record Patient Account Number: [id number] Number: Treating RN: Phillis Haggis Jul 15, 1951 (65 y.o. Other Clinician: Date of Birth/Sex: Male) Treating ROBSON, MICHAEL Primary Care Physician: Sherrie Mustache Physician/Extender: G Referring Physician: Sherrie Mustache Weeks in Treatment: 2 Visit Information History Since Last Visit All ordered tests and consults were completed: No Patient Arrived: Wheel Chair Added or deleted any medications: No Arrival Time: 14:50 Any new allergies or adverse reactions: No Accompanied By: cousin Had a fall or experienced change in No Transfer Assistance: EasyPivot Patient activities of daily living that may affect Lift risk of falls: Patient Identification Verified: Yes Signs or symptoms of abuse/neglect since last No Secondary Verification Process Yes visito Completed: Hospitalized since last visit: No Patient Requires Transmission- No Pain Present Now: No Based Precautions: Patient Has Alerts: Yes Patient Alerts: Patient on Blood Thinner Plavix ASA DM II Electronic Signature(s) Signed: 12/18/2015 5:51:12 PM By: Alejandro Mulling Entered By: Alejandro Mulling on 12/17/2015 14:50:37 Eckenrode, [name] ([id number]) -------------------------------------------------------------------------------- Clinic Level of Care Assessment Details Patient Name: CORTEZ, FLIPPEN. Date of Service: 12/17/2015 2:15 PM Medical Record Patient Account Number: [id number] Number: Treating RN: Phillis Haggis [birth year] (65 y.o. Other Clinician: Date of Birth/Sex: Male) Treating ROBSON, MICHAEL Primary Care Physician: Sherrie Mustache Physician/Extender: G Referring Physician: Sherrie Mustache Weeks in Treatment: 2 Clinic Level of Care Assessment Items TOOL 4 Quantity Score []  - Use when only  an EandM is performed on FOLLOW-UP visit 0 ASSESSMENTS - Nursing Assessment / Reassessment []  - Reassessment of Co-morbidities (includes updates in patient status) 0 X - Reassessment of Adherence to Treatment Plan 1 5 ASSESSMENTS - Wound and Skin Assessment / Reassessment []  - Simple Wound Assessment / Reassessment - one wound 0 X - Complex Wound Assessment / Reassessment - multiple wounds 2 5 []  - Dermatologic / Skin Assessment (not related to wound area) 0 ASSESSMENTS - Focused Assessment []  - Circumferential Edema Measurements - multi extremities 0 []  - Nutritional Assessment / Counseling / Intervention 0 []  - Lower Extremity Assessment (monofilament, tuning fork, pulses) 0 []  - Peripheral Arterial Disease Assessment (using hand held doppler) 0 ASSESSMENTS - Ostomy and/or Continence Assessment and Care []  - Incontinence Assessment and Management 0 []  - Ostomy Care Assessment and Management (repouching, etc.) 0 PROCESS - Coordination of Care X - Simple Patient / Family Education for ongoing care 1 15 []  - Complex (extensive) Patient / Family Education for ongoing care 0 []  - Staff obtains Chiropractor, Records, Test Results / Process Orders 0 []  - Staff telephones HHA, Nursing Homes / Clarify orders / etc 0 Alleyne, Jhony R. ([id number]) []  - Routine Transfer to another Facility (non-emergent condition) 0 []  - Routine Hospital Admission (non-emergent condition) 0 []  - New Admissions / Manufacturing engineer / Ordering NPWT, Apligraf, etc. 0 []  - Emergency Hospital Admission (emergent condition) 0 []  - Simple Discharge Coordination 0 X - Complex (extensive) Discharge Coordination 1 15 PROCESS - Special Needs []  - Pediatric / Minor Patient Management 0 []  - Isolation Patient Management 0 []  - Hearing / Language / Visual special needs 0 []  - Assessment of Community assistance (transportation, D/C planning, etc.) 0 []  - Additional assistance / Altered mentation 0 []  - Support Surface(s)  Assessment (bed, cushion, seat, etc.) 0 INTERVENTIONS - Wound Cleansing / Measurement []  - Simple Wound Cleansing - one wound 0 X - Complex Wound Cleansing - multiple wounds 2  5 X - Wound Imaging (photographs - any number of wounds) 1 5  - Wound Tracing (instead of photographs) 0  - Simple Wound Measurement - one wound 0 X - Complex Wound Measurement - multiple wounds 2 5 INTERVENTIONS - Wound Dressings X - Small Wound Dressing one or multiple wounds 2 10  - Medium Wound Dressing one or multiple wounds 0  - Large Wound Dressing one or multiple wounds 0  - Application of Medications - topical 0  - Application of Medications - injection 0 Tantillo, Alexandr R. ([id number]) INTERVENTIONS - Miscellaneous  - External ear exam 0  - Specimen Collection (cultures, biopsies, blood, body fluids, etc.) 0  - Specimen(s) / Culture(s) sent or taken to Lab for analysis 0  - Patient Transfer (multiple staff / Michiel Sites Lift / Similar devices) 0  - Simple Staple / Suture removal (25 or less) 0  - Complex Staple / Suture removal (26 or more) 0  - Hypo / Hyperglycemic Management (close monitor of Blood Glucose) 0  - Ankle / Brachial Index (ABI) - do not check if billed separately 0 X - Vital Signs 1 5 Has the patient been seen at the hospital within the last three years: Yes Total Score: 95 Level Of Care: New/Established - Level 3 Electronic Signature(s) Signed: 12/19/2015 5:45:24 PM By: Alejandro Mulling Entered By: Alejandro Mulling on 12/18/2015 17:54:13 Riechers, Montrice Elvera Lennox ([id number]) -------------------------------------------------------------------------------- Encounter Discharge Information Details Patient Name: JAMEEL, QUANT R. Date of Service: 12/17/2015 2:15 PM Medical Record Patient Account Number: [id number] Number: Treating RN: Phillis Haggis Sep 12, 1951 (65 y.o. Other Clinician: Date of Birth/Sex: Male) Treating ROBSON, MICHAEL Primary Care Physician:  Sherrie Mustache Physician/Extender: G Referring Physician: Sherrie Mustache Weeks in Treatment: 2 Encounter Discharge Information Items Discharge Pain Level: 0 Discharge Condition: Stable Ambulatory Status: Wheelchair Discharge Destination: Home Private Transportation: Auto Accompanied By: cousin Schedule Follow-up Appointment: No Medication Reconciliation completed and Yes provided to Patient/Care Kirat Mezquita: Clinical Summary of Care: Electronic Signature(s) Signed: 12/18/2015 5:51:12 PM By: Alejandro Mulling Previous Signature: 12/17/2015 3:24:47 PM Version By: Gwenlyn Perking Entered By: Alejandro Mulling on 12/17/2015 15:26:23 Muralles, [name] ([id number]) -------------------------------------------------------------------------------- Lower Extremity Assessment Details Patient Name: ZACHERIAH, STUMPE R. Date of Service: 12/17/2015 2:15 PM Medical Record Patient Account Number: [id number] Number: Treating RN: Phillis Haggis 12-11-1950 (64 y.o. Other Clinician: Date of Birth/Sex: Male) Treating ROBSON, MICHAEL Primary Care Physician: Sherrie Mustache Physician/Extender: G Referring Physician: Sherrie Mustache Weeks in Treatment: 2 Edema Assessment Assessed: [Left: No] [Right: No] E[Left: dema] [Right: :] Calf Left: Right: Point of Measurement: cm From Medial Instep 50.5 cm 50.5 cm Ankle Left: Right: Point of Measurement: cm From Medial Instep 32.5 cm 33 cm Vascular Assessment Pulses: Posterior Tibial Dorsalis Pedis Palpable: [Left:Yes] Extremity colors, hair growth, and conditions: Extremity Color: [Left:Hyperpigmented] Temperature of Extremity: [Left:Warm] Capillary Refill: [Left:< 3 seconds] Toe Nail Assessment Left: Right: Thick: Yes Discolored: No Deformed: No Improper Length and Hygiene: No Notes 45cm heel to knee Electronic Signature(s) Signed: 12/18/2015 5:51:12 PM By: Williemae Area, [name] ([id number]) Entered By: Alejandro Mulling on 12/17/2015  15:20:51 Epps, Kimmie Elvera Lennox ([id number]) -------------------------------------------------------------------------------- Multi Wound Chart Details Patient Name: [name]. Date of Service: 12/17/2015 2:15 PM Medical Record Patient Account Number: [id number] Number: Treating RN: Phillis Haggis 03-07-1951 (64 y.o. 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] [N/A:N/A] Comorbid History: [3:Chronic sinus problems/congestion, Chronic Obstructive Pulmonary Disease (COPD), Sleep Apnea, Congestive Heart Failure, Congestive Heart Failure, Hypertension, Myocardial Hypertension, Myocardial Infarction, Type II Diabetes,  Osteoarthritis, Diabetes, Osteoarthritis, Confinement Anxiety] [4:Chronic sinus problems/congestion, Chronic Obstructive Pulmonary Disease (COPD), Sleep Apnea, Infarction, Type II Confinement Anxiety] [N/A:N/A] Date Acquired: [3:11/26/2015] [4:11/26/2015] [N/A:N/A] Weeks of Treatment: [3:2] [4:2] [N/A:N/A] Wound Status: [3:Open] [4:Open] [N/A:N/A] Measurements L x W x D 0x0x0 [4:0x0x0] [N/A:N/A] (cm) Area (cm) : [3:0] [4:0] [N/A:N/A] Volume (cm) : [3:0] [4:0] [N/A:N/A] % Reduction in Area: [3:100.00%] [4:100.00%] [N/A:N/A] % Reduction in Volume: 100.00% [4:100.00%] [N/A:N/A] Classification: [3:Partial Thickness] [4:Partial Thickness] [N/A:N/A] HBO Classification:  [3:Grade 1] [4:Grade 1] [N/A:N/A] Exudate Amount: [3:None Present] [4:None Present] [N/A:N/A] Wound Margin: [3:Flat and Intact] [4:Flat and Intact] [N/A:N/A] Granulation Amount: None Present (0%) None Present (0%) N/A Necrotic Amount: None Present (0%) Large (67-100%) N/A Exposed Structures: Fascia: No Fascia: No N/A Fat: No Fat: No Tendon: No Tendon: No Muscle: No Muscle: No Joint: No Joint: No Bone: No Bone: No Limited to Skin Limited to Skin Breakdown Breakdown Epithelialization: Large (67-100%) Large (67-100%) N/A Periwound Skin Texture: No Abnormalities Noted Edema: No N/A Excoriation: No Induration: No Callus: No Crepitus: No Fluctuance: No Friable: No Rash: No Scarring: No Periwound Skin No Abnormalities Noted Maceration: No N/A Moisture: Moist: No Dry/Scaly: No Periwound Skin Color: Atrophie Blanche: No Atrophie Blanche: No N/A Cyanosis: No Cyanosis: No Ecchymosis: No Ecchymosis: No Erythema: No Erythema: No Hemosiderin Staining: No Hemosiderin Staining: No Mottled: No Mottled: No Pallor: No Pallor: No Rubor: No Rubor: No Temperature: No Abnormality No Abnormality N/A Tenderness on No Yes N/A Palpation: Wound Preparation: Ulcer Cleansing: Other: Ulcer Cleansing: Other: N/A soap and water soap and water Topical Anesthetic Applied: None Treatment Notes Electronic Signature(s) Signed: 12/18/2015 5:51:12 PM By: Alejandro Mulling Entered By: Alejandro Mulling on 12/17/2015 15:21:22 Jarvis, [name] ([id number]) -------------------------------------------------------------------------------- Multi-Disciplinary Care Plan Details Patient Name: BRAIDON, CHERMAK. Date of Service: 12/17/2015 2:15 PM Medical Record Patient Account Number: [id number] Number: Treating RN: Phillis Haggis [birth year] (64 y.o. Other Clinician: Date of Birth/Sex: Male) Treating ROBSON, MICHAEL Primary Care Physician: Sherrie Mustache Physician/Extender: G Referring  Physician: Sherrie Mustache Weeks in Treatment: 2 Active Inactive Electronic Signature(s) Signed: 12/19/2015 12:41:57 PM By: Elpidio Eric BSN, RN Signed: 12/19/2015 5:45:24 PM By: Alejandro Mulling Previous Signature: 12/18/2015 5:51:12 PM Version By: Alejandro Mulling Entered By: Elpidio Eric on 12/19/2015 12:41:56 Crutcher, [name] ([id number]) -------------------------------------------------------------------------------- Pain Assessment Details Patient Name: VANDERBILT, RANIERI. Date of Service: 12/17/2015 2:15 PM Medical Record Patient Account Number: [id number] Number: Treating RN: Phillis Haggis [birth year] (64 y.o. Other Clinician: Date of Birth/Sex: Male) Treating ROBSON, MICHAEL Primary Care Physician: Sherrie Mustache Physician/Extender: G Referring Physician: Sherrie Mustache Weeks in Treatment: 2 Active Problems Location of Pain Severity and Description of Pain Patient Has Paino No Site Locations Pain Management and Medication Current Pain Management: Electronic Signature(s) Signed: 12/18/2015 5:51:12 PM By: Alejandro Mulling Entered By: Alejandro Mulling on 12/17/2015 14:50:43 Backstrom, [name] ([id number]) -------------------------------------------------------------------------------- Patient/Caregiver Education Details Patient Name: NIK, GORRELL. Date of Service: 12/17/2015 2:15 PM Medical Record Patient Account Number: [id number] Number: Treating RN: Phillis Haggis [birth year] (64 y.o. Other Clinician: Date of Birth/Gender: Male) Treating Leanord Hawking MICHAEL Primary Care Physician:  Sherrie Mustache Physician/Extender: G Referring Physician: Augustin Coupe in Treatment: 2 Education Assessment Education Provided To: Patient Education Topics Provided Wound/Skin Impairment: Handouts: Other: wear compression stockings Methods: Demonstration, Explain/Verbal Responses: State content correctly Electronic Signature(s) Signed: 12/18/2015 5:51:12 PM By:  Alejandro Mulling Entered By: Alejandro Mulling on 12/17/2015 15:23:20 Pinson, Si Elvera Lennox ([id number]) -------------------------------------------------------------------------------- Wound Assessment Details Patient Name: JOSEDE, CICERO R. Date of Service: 12/17/2015 2:15 PM Medical Record Patient Account Number: [id number] Number: Treating RN: Phillis Haggis 05/16/1951 (64 y.o. Other Clinician: Date of Birth/Sex: Male) Treating ROBSON, MICHAEL Primary Care Physician: Sherrie Mustache Physician/Extender: G Referring Physician: Sherrie Mustache Weeks in Treatment: 2 Wound Status Wound Number: 3 Primary Arterial Insufficiency Ulcer Etiology: Wound Location: Left Lower Leg - Lateral, Proximal Wound Open Status: Wounding Event: Gradually Appeared Comorbid Chronic sinus problems/congestion, Date Acquired: 11/26/2015 History: Chronic Obstructive Pulmonary Disease Weeks Of Treatment: 2 (COPD), Sleep Apnea, Congestive Heart Clustered Wound: No Failure, Hypertension, Myocardial Infarction, Type II Diabetes, Osteoarthritis, Confinement Anxiety Photos Photo Uploaded By: Alejandro Mulling on 12/17/2015 17:30:29 Wound Measurements Length: (cm) 0 % Reduction i Width: (cm) 0 % Reduction i Depth: (cm) 0 Epithelializa Area: (cm) 0 Tunneling: Volume: (cm) 0 Undermining: n Area: 100% n Volume: 100% tion: Large (67-100%) No No Wound Description Classification: Partial Thickness Foul Odor Af Diabetic Severity (Wagner): Grade 1 Wound Margin: Flat and Intact Exudate Amount: None Present ter Cleansing: No Wound Bed Riche, Hussien R. ([id number]) Granulation Amount: None Present (0%) Exposed Structure Necrotic Amount: None Present (0%) Fascia Exposed: No Fat Layer Exposed: No Tendon Exposed: No Muscle Exposed: No Joint Exposed: No Bone Exposed: No Limited to Skin Breakdown Periwound Skin Texture Texture Color No Abnormalities Noted: Yes No Abnormalities Noted: No Atrophie  Blanche: No Moisture Cyanosis: No No Abnormalities Noted: Yes Ecchymosis: No Erythema: No Hemosiderin Staining: No Mottled: No Pallor: No Rubor: No Temperature / Pain Temperature: No Abnormality Wound Preparation Ulcer Cleansing: Other: soap and water, Topical Anesthetic Applied: None Electronic Signature(s) Signed: 12/18/2015 5:51:12 PM By: Alejandro Mulling Entered By: Alejandro Mulling on 12/17/2015 15:18:11 Fann, Revan Elvera Lennox ([id number]) -------------------------------------------------------------------------------- Wound Assessment Details Patient Name: CODEY, BURLING R. Date of Service: 12/17/2015 2:15 PM Medical Record Patient Account Number: [id number] Number: Treating RN: Phillis Haggis 1951-06-02 (64 y.o. Other Clinician: Date of Birth/Sex: Male) Treating ROBSON, MICHAEL Primary Care Physician: Sherrie Mustache Physician/Extender: G Referring Physician: Sherrie Mustache Weeks in Treatment: 2 Wound Status Wound Number: 4 Primary Venous Leg Ulcer Etiology: Wound Location: Left Lower Leg - Lateral, Distal Wound Open Wounding Event: Gradually Appeared Status: Date Acquired: 11/26/2015 Comorbid Chronic sinus problems/congestion, Weeks Of Treatment: 2 History: Chronic Obstructive Pulmonary Disease Clustered Wound: No (COPD), Sleep Apnea, Congestive Heart Failure, Hypertension, Myocardial Infarction, Type II Diabetes, Osteoarthritis, Confinement Anxiety Photos Photo Uploaded By: Alejandro Mulling on 12/17/2015 17:30:37 Wound Measurements Length: (cm) 0 Width: (cm) 0 Depth: (cm) 0 Area: (cm) 0 Volume: (cm) 0 % Reduction in Area: 100% % Reduction in Volume: 100% Epithelialization: Large (67-100%) Tunneling: No Undermining: No Wound Description Classification: Partial Thickness Diabetic Severity [address]): Grade 1 Wound Margin: Flat and Intact Exudate Amount: None Present Foul Odor After Cleansing: No Wound Bed Gripp, Zackary R.  ([id number]) Granulation Amount: None Present (0%) Exposed Structure Necrotic Amount: Large (67-100%) Fascia Exposed: No Necrotic Quality: Adherent Slough Fat Layer Exposed: No Tendon Exposed: No Muscle Exposed: No Joint Exposed: No Bone Exposed: No Limited to Skin Breakdown Periwound Skin Texture Texture Color No Abnormalities Noted: No No Abnormalities Noted: No Callus: No Atrophie Blanche: No Crepitus:  No Cyanosis: No Excoriation: No Ecchymosis: No Fluctuance: No Erythema: No Friable: No Hemosiderin Staining: No Induration: No Mottled: No Localized Edema: No Pallor: No Rash: No Rubor: No Scarring: No Temperature / Pain Moisture Temperature: No Abnormality No Abnormalities Noted: No Tenderness on Palpation: Yes Dry / Scaly: No Maceration: No Moist: No Wound Preparation Ulcer Cleansing: Other: soap and water, Electronic Signature(s) Signed: 12/18/2015 5:51:12 PM By: Alejandro Mulling Entered By: Alejandro Mulling on 12/17/2015 15:18:48 Register, Ed Elvera Lennox ([id number]) -------------------------------------------------------------------------------- Vitals Details Patient Name: [name]. Date of Service: 12/17/2015 2:15 PM Medical Record Patient Account Number: [id number] Number: Treating RN: Phillis Haggis 12-20-1950 (64 y.o. Other Clinician: Date of Birth/Sex: Male) Treating ROBSON, MICHAEL Primary Care Physician: Sherrie Mustache Physician/Extender: G Referring Physician: Sherrie Mustache Weeks in Treatment: 2 Vital Signs Time Taken: 15:00 Temperature (F): 98.1 Height (in): 66 Pulse (bpm): 77 Weight (lbs): 360 Respiratory Rate (breaths/min): 20 Body Mass Index (BMI): 58.1 Blood Pressure (mmHg): 156/91 Reference Range: 80 - 120 mg / dl Electronic Signature(s) Signed: 12/18/2015 5:51:12 PM By: Alejandro Mulling Entered By: Alejandro Mulling on 12/17/2015 15:01:16

## 2015-12-21 ENCOUNTER — Emergency Department: Payer: Medicare Other

## 2015-12-21 ENCOUNTER — Encounter: Payer: Self-pay | Admitting: Emergency Medicine

## 2015-12-21 ENCOUNTER — Emergency Department
Admission: EM | Admit: 2015-12-21 | Discharge: 2015-12-21 | Disposition: A | Discharge status: Home / Self Care | Payer: Medicare Other | Attending: Student | Admitting: Student

## 2015-12-21 DIAGNOSIS — Z7982 Long term (current) use of aspirin: Secondary | ICD-10-CM | POA: Insufficient documentation

## 2015-12-21 DIAGNOSIS — J441 Chronic obstructive pulmonary disease with (acute) exacerbation: Secondary | ICD-10-CM | POA: Diagnosis not present

## 2015-12-21 DIAGNOSIS — N200 Calculus of kidney: Secondary | ICD-10-CM | POA: Diagnosis not present

## 2015-12-21 DIAGNOSIS — R112 Nausea with vomiting, unspecified: Secondary | ICD-10-CM | POA: Insufficient documentation

## 2015-12-21 DIAGNOSIS — R0989 Other specified symptoms and signs involving the circulatory and respiratory systems: Secondary | ICD-10-CM | POA: Diagnosis not present

## 2015-12-21 DIAGNOSIS — Z9981 Dependence on supplemental oxygen: Secondary | ICD-10-CM | POA: Insufficient documentation

## 2015-12-21 DIAGNOSIS — Z7902 Long term (current) use of antithrombotics/antiplatelets: Secondary | ICD-10-CM | POA: Diagnosis not present

## 2015-12-21 DIAGNOSIS — Z79899 Other long term (current) drug therapy: Secondary | ICD-10-CM | POA: Insufficient documentation

## 2015-12-21 DIAGNOSIS — Z7984 Long term (current) use of oral hypoglycemic drugs: Secondary | ICD-10-CM | POA: Diagnosis not present

## 2015-12-21 DIAGNOSIS — R1084 Generalized abdominal pain: Secondary | ICD-10-CM | POA: Insufficient documentation

## 2015-12-21 DIAGNOSIS — R110 Nausea: Secondary | ICD-10-CM

## 2015-12-21 HISTORY — DX: Chronic obstructive pulmonary disease, unspecified: J44.9

## 2015-12-21 LAB — CBC
HCT: 39.1 % — ABNORMAL LOW (ref 40.0–52.0)
HEMOGLOBIN: 12.6 g/dL — AB (ref 13.0–18.0)
MCH: 28.4 pg (ref 26.0–34.0)
MCHC: 32.2 g/dL (ref 32.0–36.0)
MCV: 88.2 fL (ref 80.0–100.0)
Platelets: 157 10*3/uL (ref 150–440)
RBC: 4.43 MIL/uL (ref 4.40–5.90)
RDW: 14 % (ref 11.5–14.5)
WBC: 5.5 10*3/uL (ref 3.8–10.6)

## 2015-12-21 LAB — TROPONIN I

## 2015-12-21 LAB — COMPREHENSIVE METABOLIC PANEL
ALBUMIN: 3.5 g/dL (ref 3.5–5.0)
ALK PHOS: 90 U/L (ref 38–126)
ALT: 17 U/L (ref 17–63)
ANION GAP: 4 — AB (ref 5–15)
AST: 18 U/L (ref 15–41)
BUN: 17 mg/dL (ref 6–20)
CALCIUM: 9.4 mg/dL (ref 8.9–10.3)
CO2: 39 mmol/L — AB (ref 22–32)
CREATININE: 1.03 mg/dL (ref 0.61–1.24)
Chloride: 95 mmol/L — ABNORMAL LOW (ref 101–111)
GFR calc Af Amer: >60 mL/min (ref >60)
GFR calc non Af Amer: >60 mL/min (ref >60)
GLUCOSE: 150 mg/dL — AB (ref 65–99)
Potassium: 4.1 mmol/L (ref 3.5–5.1)
SODIUM: 138 mmol/L (ref 135–145)
Total Bilirubin: 0.7 mg/dL (ref 0.3–1.2)
Total Protein: 8.5 g/dL — ABNORMAL HIGH (ref 6.5–8.1)

## 2015-12-21 LAB — LIPASE, BLOOD: Lipase: 25 U/L (ref 11–51)

## 2015-12-21 LAB — BRAIN NATRIURETIC PEPTIDE: B Natriuretic Peptide: 21 pg/mL (ref 0.0–100.0)

## 2015-12-21 MED ORDER — FENTANYL CITRATE (PF) 100 MCG/2ML IJ SOLN
INTRAMUSCULAR | Status: AC
  Administered 2015-12-21: 100 ug via INTRAVENOUS
  Filled 2015-12-21: qty 2

## 2015-12-21 MED ORDER — FENTANYL CITRATE (PF) 100 MCG/2ML IJ SOLN
100.0000 ug | Freq: Once | INTRAMUSCULAR | Status: AC
  Administered 2015-12-21: 100 ug via INTRAVENOUS

## 2015-12-21 MED ORDER — ONDANSETRON HCL 4 MG/2ML IJ SOLN
4.0000 mg | Freq: Once | INTRAMUSCULAR | Status: AC
  Administered 2015-12-21: 4 mg via INTRAVENOUS
  Filled 2015-12-21: qty 2

## 2015-12-21 MED ORDER — POLYETHYLENE GLYCOL 3350 17 GM/SCOOP PO POWD: ORAL | Status: AC

## 2015-12-21 MED ORDER — FENTANYL CITRATE (PF) 100 MCG/2ML IJ SOLN
100.0000 ug | Freq: Once | INTRAMUSCULAR | Status: AC
  Administered 2015-12-21: 100 ug via INTRAVENOUS
  Filled 2015-12-21: qty 2

## 2015-12-21 MED ORDER — OXYCODONE HCL 5 MG PO TABS: 5.0000 mg | ORAL_TABLET | Freq: Four times a day (QID) | ORAL | Status: DC | PRN

## 2015-12-21 MED ORDER — ONDANSETRON 4 MG PO TBDP: 4.0000 mg | ORAL_TABLET | Freq: Three times a day (TID) | ORAL | Status: DC | PRN

## 2015-12-21 MED ORDER — IOHEXOL 240 MG/ML SOLN
25.0000 mL | Freq: Once | INTRAMUSCULAR | Status: AC | PRN
  Administered 2015-12-21: 25 mL via ORAL

## 2015-12-21 MED ORDER — IOHEXOL 300 MG/ML  SOLN
150.0000 mL | Freq: Once | INTRAMUSCULAR | Status: AC | PRN
  Administered 2015-12-21: 150 mL via INTRAVENOUS

## 2015-12-21 NOTE — ED Notes (Signed)
Pt asleep upon initially entering room, pt easily aroused by voice

## 2015-12-21 NOTE — ED Notes (Signed)
Pt reports epigastric pain x 1 day, took a laxative, had a bowel movement, pt reports distention and pain worse after eating.  Pt reports hx fluid retention.  Pt reports on the way to hospital he passed gas, felt better, but now pain increasing again.  PT NAD at this time, respirations equal and unlabored, skin warm and dry.

## 2015-12-21 NOTE — ED Notes (Signed)
Pt desat to 60s w/ good waveform.  PT's oxygen flow increased to 6L.  Pt reports he was dozing.  Pt's o2sat does appear to drop when he falls asleep

## 2015-12-21 NOTE — ED Notes (Signed)
Pt in wheelchair in room, pt refusing to move to bed, states more comfortable in chair

## 2015-12-21 NOTE — ED Provider Notes (Addendum)
[facility] Emergency Department Provider Note  ____________________________________________  Time seen: Approximately 5:05 AM  I have reviewed the triage vital signs and the nursing notes.   HISTORY  Chief Complaint Abdominal Pain and GI Problem    HPI [name] is a 65 y.o. male with COPD, CHF, chronic 6 L home oxygen requirement, coronary artery disease, obesity, diabetes, who presents for evaluation of one day of generalized abdominal pain and distention, worse in the epigastrium, gradual onset, constant since onset, currently moderate to severe, worse with eating. He has had nausea as well as one episode nonbloody nonbilious emesis. He had a bowel movement yesterday and he reports his pain is improving as he is passing flatus. Chronic shortness of breath not increased from baseline however he has worsening leg swelling bilaterally and has not been able take his Lasix due to nausea.   Past Medical History  Diagnosis Date  . High blood pressure   . Heart attack (HCC)   . Asthma   . High cholesterol   . Seasonal allergies   . Sleep apnea   . Scoliosis   . COPD (chronic obstructive pulmonary disease) [facility])     Patient Active Problem List   Diagnosis Date Noted  . Chronic hypoxemic respiratory failure (HCC) 12/12/2013  . Shortness of breath 11/21/2013  . OSA on CPAP 11/21/2013  . CHF (congestive heart failure) (HCC) 11/21/2013  . CAD (coronary artery disease) of artery bypass graft 11/21/2013    Past Surgical History  Procedure Laterality Date  . Back sx      L4 L5   . Carpal tunnel release Right     Current Outpatient Rx  Name  Route  Sig  Dispense  Refill  . aspirin 81 MG tablet   Oral   Take 81 mg by mouth daily.         . celecoxib (CELEBREX) 200 MG capsule   Oral   Take 200 mg by mouth daily.         . Cholecalciferol (VITAMIN D3) 50000 units CAPS   Oral   Take 1 capsule by mouth once a week.         . clopidogrel  (PLAVIX) 75 MG tablet   Oral   Take 75 mg by mouth daily.         . furosemide (LASIX) 40 MG tablet   Oral   Take 40 mg by mouth daily.         Marland Kitchen glipiZIDE (GLUCOTROL) 5 MG tablet   Oral   Take 5 mg by mouth daily before breakfast.         . Liraglutide (VICTOZA) 18 MG/3ML SOPN   Subcutaneous   Inject 1.8 mg into the skin daily.         . nebivolol (BYSTOLIC) 10 MG tablet   Oral   Take 10 mg by mouth daily.         . Nebivolol HCl (BYSTOLIC) 20 MG TABS   Oral   Take 20 mg by mouth at bedtime.         Marland Kitchen omeprazole (PRILOSEC) 40 MG capsule   Oral   Take 40 mg by mouth daily.         . rosuvastatin (CRESTOR) 10 MG tablet   Oral   Take 10 mg by mouth daily.         . tamsulosin (FLOMAX) 0.4 MG CAPS capsule   Oral   Take 0.4 mg by mouth daily.         Marland Kitchen  telmisartan (MICARDIS) 80 MG tablet   Oral   Take 80 mg by mouth daily.         . ondansetron (ZOFRAN ODT) 4 MG disintegrating tablet   Oral   Take 1 tablet (4 mg total) by mouth every 8 (eight) hours as needed for nausea or vomiting.   8 tablet   0   . oxyCODONE (ROXICODONE) 5 MG immediate release tablet   Oral   Take 1 tablet (5 mg total) by mouth every 6 (six) hours as needed for moderate pain. Do not drive while taking this medication.   8 tablet   0   . polyethylene glycol powder (GLYCOLAX/MIRALAX) powder      Dissolve one heaping tablespoon in 4-8 ounces of juice or water and drink once daily.   255 g   0     Allergies Metformin and related and Morphine and related  Family History  Problem Relation Age of Onset  . Heart disease Father     Social History Social History  Substance Use Topics  . Smoking status: Never Smoker   . Smokeless tobacco: Never Used  . Alcohol Use: No    Review of Systems Constitutional: No fever/chills Eyes: No visual changes. ENT: No sore throat. Cardiovascular: Denies chest pain. Respiratory: +chronic shortness of breath. Gastrointestinal: +  abdominal pain.  + nausea, + vomiting.  No diarrhea.  No constipation. Genitourinary: Negative for dysuria. Musculoskeletal: Negative for back pain. Skin: Negative for rash. Neurological: Negative for headaches, focal weakness or numbness.  10-point ROS otherwise negative.  ____________________________________________   PHYSICAL EXAM:  VITAL SIGNS: ED Triage Vitals  Enc Vitals Group     BP 12/21/15 0252 144/80 mmHg     Pulse Rate 12/21/15 0252 84     Resp 12/21/15 0252 20     Temp 12/21/15 0252 98.1 F (36.7 C)     Temp Source 12/21/15 0252 Oral     SpO2 12/21/15 0252 93 %     Weight 12/21/15 0252 340 lb (154.223 kg)     Height 12/21/15 0252  (1.676 m)     Head Cir --      Peak Flow --      Pain Score 12/21/15 0253 8     Pain Loc --      Pain Edu? --      Excl. in GC? --     Constitutional: Alert and oriented. Chronically ill-appearing but in no acute distress. Eyes: Conjunctivae are normal. PERRL. EOMI. Head: Atraumatic. Nose: No congestion/rhinnorhea. Mouth/Throat: Mucous membranes are moist.  Oropharynx non-erythematous. Neck: No stridor.  No cervical spine tenderness to palpation. Cardiovascular: Normal rate, regular rhythm. Grossly normal heart sounds.  Good peripheral circulation. Respiratory: Normal respiratory effort.  No retractions. Lungs CTAB. Gastrointestinal: Obese body habitus limits the examination. Normal bowel sounds, the abdomen is distended and mildly diffusely tender.Marland Kitchen No CVA tenderness. Genitourinary: deferred Musculoskeletal: 1+ pitting edema bilateral lower extremities.  No joint effusions. Neurologic:  Normal speech and language. No gross focal neurologic deficits are appreciated.  Skin:  Skin is warm, dry and intact. No rash noted. Psychiatric: Mood and affect are normal. Speech and behavior are normal.  ____________________________________________   LABS (all labs ordered are listed, but only abnormal results are displayed)  Labs  Reviewed  COMPREHENSIVE METABOLIC PANEL - Abnormal; Notable for the following:    Chloride 95 (*)    CO2 39 (*)    Glucose, Bld 150 (*)    Total Protein 8.5 (*)  Anion gap 4 (*)    All other components within normal limits  CBC - Abnormal; Notable for the following:    Hemoglobin 12.6 (*)    HCT 39.1 (*)    All other components within normal limits  LIPASE, BLOOD  TROPONIN I  BRAIN NATRIURETIC PEPTIDE   ____________________________________________  EKG  ED ECG REPORT I, Gayla Doss, the attending physician, personally viewed and interpreted this ECG.   Date: 12/21/2015  EKG Time: 02:57  Rate: 83  Rhythm: normal sinus rhythm  Axis: normal  Intervals:none  ST&T Change: No acute ST elevation. Nonspecific T-wave abnormality.  ____________________________________________  RADIOLOGY  CXR IMPRESSION: Lungs hypoexpanded. Vascular congestion noted. Mild bibasilar opacities may reflect atelectasis or possibly mild pneumonia.   CT abdomen and pelvis IMPRESSION: 1. No acute abnormality seen within the abdomen or pelvis. 2. The colon is decompressed and grossly unremarkable. No significant stool seen. 3. Mild bibasilar opacities likely reflect atelectasis. 4. Scattered coronary artery calcifications seen. 5. Mild scattered calcification along the abdominal aorta and its branches. 6. 1.2 cm right renal stone noted.  ____________________________________________   PROCEDURES  Procedure(s) performed: None  Critical Care performed: No  ____________________________________________   INITIAL IMPRESSION / ASSESSMENT AND PLAN / ED COURSE  Pertinent labs & imaging results that were available during my care of the patient were reviewed by me and considered in my medical decision making (see chart for details).  [name] is a 65 y.o. male with COPD, CHF, chronic 6 L home oxygen requirement, coronary artery disease, obesity, diabetes, who presents for evaluation  of one day of generalized abdominal pain and distention, worse in the epigastrium, associated with nausea. On exam he is chronically ill-appearing. Mildly tachypneic which may be pain-related but the remainder of his vital signs are stable, he is afebrile. No increase in his home oxygen requirement. He does have tenderness throughout the abdomen and some distention. Plan for screening labs, CT of the abdomen and pelvis, chest x-ray, we'll treat his pain and reassess for disposition. He denies history of urinary tract infection and denies any urinary complaints so will not obtain urinalysis at this time.  ----------------------------------------- 7:09 AM on 12/21/2015 ----------------------------------------- Labs reviewed. CMP notable for bicarbonate elevation which is likely chronic, CBC is notable for mild anemia, hemoglobin 12.6. Patient denies blood in stool or dark, tarry stools.. Troponin negative. BNP is not elevated. Unremarkable lipase. Chest x-ray most consistent with atelectasis, patient has no fever, no cough, no increase oxygen requirement I doubt pneumonia. CT of the abdomen and pelvis shows no clear cause for his abdominal pain; however, His pain has improved at this time and he is tolerating PO intake. We discussed return precautions, need for close PCP follow-up and he is comfortable with the discharge plan. DC home. ____________________________________________   FINAL CLINICAL IMPRESSION(S) / ED DIAGNOSES  Final diagnoses:  Generalized abdominal pain  Nausea      Gayla Doss, MD 12/21/15 1610  Gayla Doss, MD 12/21/15 9604

## 2015-12-21 NOTE — ED Notes (Signed)
Pt c/o epigastric pain that started around 5am Saturday morning; pt says he took a stool softener Saturday morning and had 1 bowel movement; felt better after bowel movement but then pain and swelling returned; every time he's tried to eat or drink he feels worse and his abd feels bloated; no nausea or vomiting; belching; pain has been constant but varies in severity; pt says on the way to hospital he started "breaking wind", which he hasn't done all day; says since he arrived he's felt the best he's felt all day

## 2015-12-23 ENCOUNTER — Ambulatory Visit: Payer: Self-pay | Admitting: Internal Medicine

## 2015-12-23 DIAGNOSIS — R110 Nausea: Secondary | ICD-10-CM | POA: Diagnosis not present

## 2015-12-23 DIAGNOSIS — R1013 Epigastric pain: Secondary | ICD-10-CM | POA: Diagnosis not present

## 2015-12-25 DIAGNOSIS — R1084 Generalized abdominal pain: Secondary | ICD-10-CM | POA: Diagnosis not present

## 2015-12-25 DIAGNOSIS — R112 Nausea with vomiting, unspecified: Secondary | ICD-10-CM | POA: Diagnosis not present

## 2015-12-25 DIAGNOSIS — E119 Type 2 diabetes mellitus without complications: Secondary | ICD-10-CM | POA: Diagnosis not present

## 2016-01-02 DIAGNOSIS — L97322 Non-pressure chronic ulcer of left ankle with fat layer exposed: Secondary | ICD-10-CM | POA: Diagnosis not present

## 2016-01-02 DIAGNOSIS — I87332 Chronic venous hypertension (idiopathic) with ulcer and inflammation of left lower extremity: Secondary | ICD-10-CM | POA: Diagnosis not present

## 2016-01-02 DIAGNOSIS — L97222 Non-pressure chronic ulcer of left calf with fat layer exposed: Secondary | ICD-10-CM | POA: Diagnosis not present

## 2016-01-02 DIAGNOSIS — E11622 Type 2 diabetes mellitus with other skin ulcer: Secondary | ICD-10-CM | POA: Diagnosis not present

## 2016-01-06 DIAGNOSIS — Z7902 Long term (current) use of antithrombotics/antiplatelets: Secondary | ICD-10-CM | POA: Diagnosis not present

## 2016-01-06 DIAGNOSIS — L97322 Non-pressure chronic ulcer of left ankle with fat layer exposed: Secondary | ICD-10-CM | POA: Diagnosis not present

## 2016-01-06 DIAGNOSIS — E11622 Type 2 diabetes mellitus with other skin ulcer: Secondary | ICD-10-CM | POA: Diagnosis not present

## 2016-01-06 DIAGNOSIS — Z7982 Long term (current) use of aspirin: Secondary | ICD-10-CM | POA: Diagnosis not present

## 2016-01-06 DIAGNOSIS — Z7984 Long term (current) use of oral hypoglycemic drugs: Secondary | ICD-10-CM | POA: Diagnosis not present

## 2016-01-06 DIAGNOSIS — I5022 Chronic systolic (congestive) heart failure: Secondary | ICD-10-CM | POA: Diagnosis not present

## 2016-01-06 DIAGNOSIS — L97222 Non-pressure chronic ulcer of left calf with fat layer exposed: Secondary | ICD-10-CM | POA: Diagnosis not present

## 2016-01-06 DIAGNOSIS — I87332 Chronic venous hypertension (idiopathic) with ulcer and inflammation of left lower extremity: Secondary | ICD-10-CM | POA: Diagnosis not present

## 2016-01-06 DIAGNOSIS — Z4800 Encounter for change or removal of nonsurgical wound dressing: Secondary | ICD-10-CM | POA: Diagnosis not present

## 2016-01-06 DIAGNOSIS — I890 Lymphedema, not elsewhere classified: Secondary | ICD-10-CM | POA: Diagnosis not present

## 2016-01-06 DIAGNOSIS — E1159 Type 2 diabetes mellitus with other circulatory complications: Secondary | ICD-10-CM | POA: Diagnosis not present

## 2016-01-09 DIAGNOSIS — E1159 Type 2 diabetes mellitus with other circulatory complications: Secondary | ICD-10-CM | POA: Diagnosis not present

## 2016-01-09 DIAGNOSIS — L97322 Non-pressure chronic ulcer of left ankle with fat layer exposed: Secondary | ICD-10-CM | POA: Diagnosis not present

## 2016-01-09 DIAGNOSIS — Z4800 Encounter for change or removal of nonsurgical wound dressing: Secondary | ICD-10-CM | POA: Diagnosis not present

## 2016-01-09 DIAGNOSIS — I87332 Chronic venous hypertension (idiopathic) with ulcer and inflammation of left lower extremity: Secondary | ICD-10-CM | POA: Diagnosis not present

## 2016-01-09 DIAGNOSIS — I890 Lymphedema, not elsewhere classified: Secondary | ICD-10-CM | POA: Diagnosis not present

## 2016-01-09 DIAGNOSIS — Z7902 Long term (current) use of antithrombotics/antiplatelets: Secondary | ICD-10-CM | POA: Diagnosis not present

## 2016-01-09 DIAGNOSIS — I5022 Chronic systolic (congestive) heart failure: Secondary | ICD-10-CM | POA: Diagnosis not present

## 2016-01-09 DIAGNOSIS — L97222 Non-pressure chronic ulcer of left calf with fat layer exposed: Secondary | ICD-10-CM | POA: Diagnosis not present

## 2016-01-09 DIAGNOSIS — E11622 Type 2 diabetes mellitus with other skin ulcer: Secondary | ICD-10-CM | POA: Diagnosis not present

## 2016-01-09 DIAGNOSIS — Z7982 Long term (current) use of aspirin: Secondary | ICD-10-CM | POA: Diagnosis not present

## 2016-01-09 DIAGNOSIS — Z7984 Long term (current) use of oral hypoglycemic drugs: Secondary | ICD-10-CM | POA: Diagnosis not present

## 2016-01-10 DIAGNOSIS — I509 Heart failure, unspecified: Secondary | ICD-10-CM | POA: Diagnosis not present

## 2016-01-13 DIAGNOSIS — L97322 Non-pressure chronic ulcer of left ankle with fat layer exposed: Secondary | ICD-10-CM | POA: Diagnosis not present

## 2016-01-13 DIAGNOSIS — Z7984 Long term (current) use of oral hypoglycemic drugs: Secondary | ICD-10-CM | POA: Diagnosis not present

## 2016-01-13 DIAGNOSIS — Z7902 Long term (current) use of antithrombotics/antiplatelets: Secondary | ICD-10-CM | POA: Diagnosis not present

## 2016-01-13 DIAGNOSIS — I890 Lymphedema, not elsewhere classified: Secondary | ICD-10-CM | POA: Diagnosis not present

## 2016-01-13 DIAGNOSIS — Z4800 Encounter for change or removal of nonsurgical wound dressing: Secondary | ICD-10-CM | POA: Diagnosis not present

## 2016-01-13 DIAGNOSIS — L97222 Non-pressure chronic ulcer of left calf with fat layer exposed: Secondary | ICD-10-CM | POA: Diagnosis not present

## 2016-01-13 DIAGNOSIS — Z7982 Long term (current) use of aspirin: Secondary | ICD-10-CM | POA: Diagnosis not present

## 2016-01-13 DIAGNOSIS — I87332 Chronic venous hypertension (idiopathic) with ulcer and inflammation of left lower extremity: Secondary | ICD-10-CM | POA: Diagnosis not present

## 2016-01-13 DIAGNOSIS — I5022 Chronic systolic (congestive) heart failure: Secondary | ICD-10-CM | POA: Diagnosis not present

## 2016-01-13 DIAGNOSIS — E1159 Type 2 diabetes mellitus with other circulatory complications: Secondary | ICD-10-CM | POA: Diagnosis not present

## 2016-01-13 DIAGNOSIS — E11622 Type 2 diabetes mellitus with other skin ulcer: Secondary | ICD-10-CM | POA: Diagnosis not present

## 2016-01-15 DIAGNOSIS — E11622 Type 2 diabetes mellitus with other skin ulcer: Secondary | ICD-10-CM | POA: Diagnosis not present

## 2016-01-15 DIAGNOSIS — L97222 Non-pressure chronic ulcer of left calf with fat layer exposed: Secondary | ICD-10-CM | POA: Diagnosis not present

## 2016-01-15 DIAGNOSIS — I890 Lymphedema, not elsewhere classified: Secondary | ICD-10-CM | POA: Diagnosis not present

## 2016-01-15 DIAGNOSIS — E1159 Type 2 diabetes mellitus with other circulatory complications: Secondary | ICD-10-CM | POA: Diagnosis not present

## 2016-01-15 DIAGNOSIS — I87332 Chronic venous hypertension (idiopathic) with ulcer and inflammation of left lower extremity: Secondary | ICD-10-CM | POA: Diagnosis not present

## 2016-01-15 DIAGNOSIS — Z7982 Long term (current) use of aspirin: Secondary | ICD-10-CM | POA: Diagnosis not present

## 2016-01-15 DIAGNOSIS — L97322 Non-pressure chronic ulcer of left ankle with fat layer exposed: Secondary | ICD-10-CM | POA: Diagnosis not present

## 2016-01-15 DIAGNOSIS — Z7984 Long term (current) use of oral hypoglycemic drugs: Secondary | ICD-10-CM | POA: Diagnosis not present

## 2016-01-15 DIAGNOSIS — Z7902 Long term (current) use of antithrombotics/antiplatelets: Secondary | ICD-10-CM | POA: Diagnosis not present

## 2016-01-15 DIAGNOSIS — Z4800 Encounter for change or removal of nonsurgical wound dressing: Secondary | ICD-10-CM | POA: Diagnosis not present

## 2016-01-15 DIAGNOSIS — I5022 Chronic systolic (congestive) heart failure: Secondary | ICD-10-CM | POA: Diagnosis not present

## 2016-01-16 DIAGNOSIS — E1159 Type 2 diabetes mellitus with other circulatory complications: Secondary | ICD-10-CM | POA: Diagnosis not present

## 2016-01-16 DIAGNOSIS — I5022 Chronic systolic (congestive) heart failure: Secondary | ICD-10-CM | POA: Diagnosis not present

## 2016-01-16 DIAGNOSIS — I890 Lymphedema, not elsewhere classified: Secondary | ICD-10-CM | POA: Diagnosis not present

## 2016-01-16 DIAGNOSIS — Z7984 Long term (current) use of oral hypoglycemic drugs: Secondary | ICD-10-CM | POA: Diagnosis not present

## 2016-01-16 DIAGNOSIS — I87332 Chronic venous hypertension (idiopathic) with ulcer and inflammation of left lower extremity: Secondary | ICD-10-CM | POA: Diagnosis not present

## 2016-01-16 DIAGNOSIS — Z7982 Long term (current) use of aspirin: Secondary | ICD-10-CM | POA: Diagnosis not present

## 2016-01-16 DIAGNOSIS — L97322 Non-pressure chronic ulcer of left ankle with fat layer exposed: Secondary | ICD-10-CM | POA: Diagnosis not present

## 2016-01-16 DIAGNOSIS — Z7902 Long term (current) use of antithrombotics/antiplatelets: Secondary | ICD-10-CM | POA: Diagnosis not present

## 2016-01-16 DIAGNOSIS — Z4800 Encounter for change or removal of nonsurgical wound dressing: Secondary | ICD-10-CM | POA: Diagnosis not present

## 2016-01-16 DIAGNOSIS — E11622 Type 2 diabetes mellitus with other skin ulcer: Secondary | ICD-10-CM | POA: Diagnosis not present

## 2016-01-16 DIAGNOSIS — L97222 Non-pressure chronic ulcer of left calf with fat layer exposed: Secondary | ICD-10-CM | POA: Diagnosis not present

## 2016-01-19 DIAGNOSIS — Z7984 Long term (current) use of oral hypoglycemic drugs: Secondary | ICD-10-CM | POA: Diagnosis not present

## 2016-01-19 DIAGNOSIS — I87332 Chronic venous hypertension (idiopathic) with ulcer and inflammation of left lower extremity: Secondary | ICD-10-CM | POA: Diagnosis not present

## 2016-01-19 DIAGNOSIS — L97322 Non-pressure chronic ulcer of left ankle with fat layer exposed: Secondary | ICD-10-CM | POA: Diagnosis not present

## 2016-01-19 DIAGNOSIS — I890 Lymphedema, not elsewhere classified: Secondary | ICD-10-CM | POA: Diagnosis not present

## 2016-01-19 DIAGNOSIS — I5022 Chronic systolic (congestive) heart failure: Secondary | ICD-10-CM | POA: Diagnosis not present

## 2016-01-19 DIAGNOSIS — E1159 Type 2 diabetes mellitus with other circulatory complications: Secondary | ICD-10-CM | POA: Diagnosis not present

## 2016-01-19 DIAGNOSIS — Z7982 Long term (current) use of aspirin: Secondary | ICD-10-CM | POA: Diagnosis not present

## 2016-01-19 DIAGNOSIS — E11622 Type 2 diabetes mellitus with other skin ulcer: Secondary | ICD-10-CM | POA: Diagnosis not present

## 2016-01-19 DIAGNOSIS — Z4800 Encounter for change or removal of nonsurgical wound dressing: Secondary | ICD-10-CM | POA: Diagnosis not present

## 2016-01-19 DIAGNOSIS — Z7902 Long term (current) use of antithrombotics/antiplatelets: Secondary | ICD-10-CM | POA: Diagnosis not present

## 2016-01-19 DIAGNOSIS — L97222 Non-pressure chronic ulcer of left calf with fat layer exposed: Secondary | ICD-10-CM | POA: Diagnosis not present

## 2016-01-20 DIAGNOSIS — Z7984 Long term (current) use of oral hypoglycemic drugs: Secondary | ICD-10-CM | POA: Diagnosis not present

## 2016-01-20 DIAGNOSIS — E1159 Type 2 diabetes mellitus with other circulatory complications: Secondary | ICD-10-CM | POA: Diagnosis not present

## 2016-01-20 DIAGNOSIS — Z4800 Encounter for change or removal of nonsurgical wound dressing: Secondary | ICD-10-CM | POA: Diagnosis not present

## 2016-01-20 DIAGNOSIS — I87332 Chronic venous hypertension (idiopathic) with ulcer and inflammation of left lower extremity: Secondary | ICD-10-CM | POA: Diagnosis not present

## 2016-01-20 DIAGNOSIS — L97322 Non-pressure chronic ulcer of left ankle with fat layer exposed: Secondary | ICD-10-CM | POA: Diagnosis not present

## 2016-01-20 DIAGNOSIS — I890 Lymphedema, not elsewhere classified: Secondary | ICD-10-CM | POA: Diagnosis not present

## 2016-01-20 DIAGNOSIS — E11622 Type 2 diabetes mellitus with other skin ulcer: Secondary | ICD-10-CM | POA: Diagnosis not present

## 2016-01-20 DIAGNOSIS — I5022 Chronic systolic (congestive) heart failure: Secondary | ICD-10-CM | POA: Diagnosis not present

## 2016-01-20 DIAGNOSIS — Z7902 Long term (current) use of antithrombotics/antiplatelets: Secondary | ICD-10-CM | POA: Diagnosis not present

## 2016-01-20 DIAGNOSIS — L97222 Non-pressure chronic ulcer of left calf with fat layer exposed: Secondary | ICD-10-CM | POA: Diagnosis not present

## 2016-01-20 DIAGNOSIS — Z7982 Long term (current) use of aspirin: Secondary | ICD-10-CM | POA: Diagnosis not present

## 2016-01-21 DIAGNOSIS — I87332 Chronic venous hypertension (idiopathic) with ulcer and inflammation of left lower extremity: Secondary | ICD-10-CM | POA: Diagnosis not present

## 2016-01-21 DIAGNOSIS — Z4800 Encounter for change or removal of nonsurgical wound dressing: Secondary | ICD-10-CM | POA: Diagnosis not present

## 2016-01-21 DIAGNOSIS — I890 Lymphedema, not elsewhere classified: Secondary | ICD-10-CM | POA: Diagnosis not present

## 2016-01-21 DIAGNOSIS — Z7982 Long term (current) use of aspirin: Secondary | ICD-10-CM | POA: Diagnosis not present

## 2016-01-21 DIAGNOSIS — I5022 Chronic systolic (congestive) heart failure: Secondary | ICD-10-CM | POA: Diagnosis not present

## 2016-01-21 DIAGNOSIS — Z7902 Long term (current) use of antithrombotics/antiplatelets: Secondary | ICD-10-CM | POA: Diagnosis not present

## 2016-01-21 DIAGNOSIS — Z7984 Long term (current) use of oral hypoglycemic drugs: Secondary | ICD-10-CM | POA: Diagnosis not present

## 2016-01-21 DIAGNOSIS — E11622 Type 2 diabetes mellitus with other skin ulcer: Secondary | ICD-10-CM | POA: Diagnosis not present

## 2016-01-21 DIAGNOSIS — E1159 Type 2 diabetes mellitus with other circulatory complications: Secondary | ICD-10-CM | POA: Diagnosis not present

## 2016-01-21 DIAGNOSIS — L97322 Non-pressure chronic ulcer of left ankle with fat layer exposed: Secondary | ICD-10-CM | POA: Diagnosis not present

## 2016-01-21 DIAGNOSIS — L97222 Non-pressure chronic ulcer of left calf with fat layer exposed: Secondary | ICD-10-CM | POA: Diagnosis not present

## 2016-01-23 DIAGNOSIS — L97322 Non-pressure chronic ulcer of left ankle with fat layer exposed: Secondary | ICD-10-CM | POA: Diagnosis not present

## 2016-01-23 DIAGNOSIS — Z7982 Long term (current) use of aspirin: Secondary | ICD-10-CM | POA: Diagnosis not present

## 2016-01-23 DIAGNOSIS — E11622 Type 2 diabetes mellitus with other skin ulcer: Secondary | ICD-10-CM | POA: Diagnosis not present

## 2016-01-23 DIAGNOSIS — E1159 Type 2 diabetes mellitus with other circulatory complications: Secondary | ICD-10-CM | POA: Diagnosis not present

## 2016-01-23 DIAGNOSIS — Z7984 Long term (current) use of oral hypoglycemic drugs: Secondary | ICD-10-CM | POA: Diagnosis not present

## 2016-01-23 DIAGNOSIS — I890 Lymphedema, not elsewhere classified: Secondary | ICD-10-CM | POA: Diagnosis not present

## 2016-01-23 DIAGNOSIS — I5022 Chronic systolic (congestive) heart failure: Secondary | ICD-10-CM | POA: Diagnosis not present

## 2016-01-23 DIAGNOSIS — L97222 Non-pressure chronic ulcer of left calf with fat layer exposed: Secondary | ICD-10-CM | POA: Diagnosis not present

## 2016-01-23 DIAGNOSIS — Z4800 Encounter for change or removal of nonsurgical wound dressing: Secondary | ICD-10-CM | POA: Diagnosis not present

## 2016-01-23 DIAGNOSIS — I87332 Chronic venous hypertension (idiopathic) with ulcer and inflammation of left lower extremity: Secondary | ICD-10-CM | POA: Diagnosis not present

## 2016-01-23 DIAGNOSIS — Z7902 Long term (current) use of antithrombotics/antiplatelets: Secondary | ICD-10-CM | POA: Diagnosis not present

## 2016-01-27 DIAGNOSIS — Z7982 Long term (current) use of aspirin: Secondary | ICD-10-CM | POA: Diagnosis not present

## 2016-01-27 DIAGNOSIS — Z7984 Long term (current) use of oral hypoglycemic drugs: Secondary | ICD-10-CM | POA: Diagnosis not present

## 2016-01-27 DIAGNOSIS — E11622 Type 2 diabetes mellitus with other skin ulcer: Secondary | ICD-10-CM | POA: Diagnosis not present

## 2016-01-27 DIAGNOSIS — L97222 Non-pressure chronic ulcer of left calf with fat layer exposed: Secondary | ICD-10-CM | POA: Diagnosis not present

## 2016-01-27 DIAGNOSIS — I87332 Chronic venous hypertension (idiopathic) with ulcer and inflammation of left lower extremity: Secondary | ICD-10-CM | POA: Diagnosis not present

## 2016-01-27 DIAGNOSIS — I890 Lymphedema, not elsewhere classified: Secondary | ICD-10-CM | POA: Diagnosis not present

## 2016-01-27 DIAGNOSIS — I5022 Chronic systolic (congestive) heart failure: Secondary | ICD-10-CM | POA: Diagnosis not present

## 2016-01-27 DIAGNOSIS — E1159 Type 2 diabetes mellitus with other circulatory complications: Secondary | ICD-10-CM | POA: Diagnosis not present

## 2016-01-27 DIAGNOSIS — Z7902 Long term (current) use of antithrombotics/antiplatelets: Secondary | ICD-10-CM | POA: Diagnosis not present

## 2016-01-27 DIAGNOSIS — Z4800 Encounter for change or removal of nonsurgical wound dressing: Secondary | ICD-10-CM | POA: Diagnosis not present

## 2016-01-27 DIAGNOSIS — L97322 Non-pressure chronic ulcer of left ankle with fat layer exposed: Secondary | ICD-10-CM | POA: Diagnosis not present

## 2016-01-29 ENCOUNTER — Ambulatory Visit: Payer: Self-pay | Admitting: Gastroenterology

## 2016-01-30 DIAGNOSIS — I890 Lymphedema, not elsewhere classified: Secondary | ICD-10-CM | POA: Diagnosis not present

## 2016-01-30 DIAGNOSIS — E1159 Type 2 diabetes mellitus with other circulatory complications: Secondary | ICD-10-CM | POA: Diagnosis not present

## 2016-01-30 DIAGNOSIS — Z7982 Long term (current) use of aspirin: Secondary | ICD-10-CM | POA: Diagnosis not present

## 2016-01-30 DIAGNOSIS — I87332 Chronic venous hypertension (idiopathic) with ulcer and inflammation of left lower extremity: Secondary | ICD-10-CM | POA: Diagnosis not present

## 2016-01-30 DIAGNOSIS — Z7902 Long term (current) use of antithrombotics/antiplatelets: Secondary | ICD-10-CM | POA: Diagnosis not present

## 2016-01-30 DIAGNOSIS — E11622 Type 2 diabetes mellitus with other skin ulcer: Secondary | ICD-10-CM | POA: Diagnosis not present

## 2016-01-30 DIAGNOSIS — L97322 Non-pressure chronic ulcer of left ankle with fat layer exposed: Secondary | ICD-10-CM | POA: Diagnosis not present

## 2016-01-30 DIAGNOSIS — Z4800 Encounter for change or removal of nonsurgical wound dressing: Secondary | ICD-10-CM | POA: Diagnosis not present

## 2016-01-30 DIAGNOSIS — I5022 Chronic systolic (congestive) heart failure: Secondary | ICD-10-CM | POA: Diagnosis not present

## 2016-01-30 DIAGNOSIS — Z7984 Long term (current) use of oral hypoglycemic drugs: Secondary | ICD-10-CM | POA: Diagnosis not present

## 2016-01-30 DIAGNOSIS — L97222 Non-pressure chronic ulcer of left calf with fat layer exposed: Secondary | ICD-10-CM | POA: Diagnosis not present

## 2016-02-02 DIAGNOSIS — Z7902 Long term (current) use of antithrombotics/antiplatelets: Secondary | ICD-10-CM | POA: Diagnosis not present

## 2016-02-02 DIAGNOSIS — L97222 Non-pressure chronic ulcer of left calf with fat layer exposed: Secondary | ICD-10-CM | POA: Diagnosis not present

## 2016-02-02 DIAGNOSIS — Z4800 Encounter for change or removal of nonsurgical wound dressing: Secondary | ICD-10-CM | POA: Diagnosis not present

## 2016-02-02 DIAGNOSIS — E11622 Type 2 diabetes mellitus with other skin ulcer: Secondary | ICD-10-CM | POA: Diagnosis not present

## 2016-02-02 DIAGNOSIS — E1159 Type 2 diabetes mellitus with other circulatory complications: Secondary | ICD-10-CM | POA: Diagnosis not present

## 2016-02-02 DIAGNOSIS — L97322 Non-pressure chronic ulcer of left ankle with fat layer exposed: Secondary | ICD-10-CM | POA: Diagnosis not present

## 2016-02-02 DIAGNOSIS — I890 Lymphedema, not elsewhere classified: Secondary | ICD-10-CM | POA: Diagnosis not present

## 2016-02-02 DIAGNOSIS — I87332 Chronic venous hypertension (idiopathic) with ulcer and inflammation of left lower extremity: Secondary | ICD-10-CM | POA: Diagnosis not present

## 2016-02-02 DIAGNOSIS — I5022 Chronic systolic (congestive) heart failure: Secondary | ICD-10-CM | POA: Diagnosis not present

## 2016-02-02 DIAGNOSIS — Z7982 Long term (current) use of aspirin: Secondary | ICD-10-CM | POA: Diagnosis not present

## 2016-02-02 DIAGNOSIS — Z7984 Long term (current) use of oral hypoglycemic drugs: Secondary | ICD-10-CM | POA: Diagnosis not present

## 2016-02-04 DIAGNOSIS — L97222 Non-pressure chronic ulcer of left calf with fat layer exposed: Secondary | ICD-10-CM | POA: Diagnosis not present

## 2016-02-04 DIAGNOSIS — E1159 Type 2 diabetes mellitus with other circulatory complications: Secondary | ICD-10-CM | POA: Diagnosis not present

## 2016-02-04 DIAGNOSIS — I5022 Chronic systolic (congestive) heart failure: Secondary | ICD-10-CM | POA: Diagnosis not present

## 2016-02-04 DIAGNOSIS — L97322 Non-pressure chronic ulcer of left ankle with fat layer exposed: Secondary | ICD-10-CM | POA: Diagnosis not present

## 2016-02-04 DIAGNOSIS — Z7984 Long term (current) use of oral hypoglycemic drugs: Secondary | ICD-10-CM | POA: Diagnosis not present

## 2016-02-04 DIAGNOSIS — Z7982 Long term (current) use of aspirin: Secondary | ICD-10-CM | POA: Diagnosis not present

## 2016-02-04 DIAGNOSIS — E11622 Type 2 diabetes mellitus with other skin ulcer: Secondary | ICD-10-CM | POA: Diagnosis not present

## 2016-02-04 DIAGNOSIS — Z4800 Encounter for change or removal of nonsurgical wound dressing: Secondary | ICD-10-CM | POA: Diagnosis not present

## 2016-02-04 DIAGNOSIS — I87332 Chronic venous hypertension (idiopathic) with ulcer and inflammation of left lower extremity: Secondary | ICD-10-CM | POA: Diagnosis not present

## 2016-02-04 DIAGNOSIS — Z7902 Long term (current) use of antithrombotics/antiplatelets: Secondary | ICD-10-CM | POA: Diagnosis not present

## 2016-02-04 DIAGNOSIS — I890 Lymphedema, not elsewhere classified: Secondary | ICD-10-CM | POA: Diagnosis not present

## 2016-02-06 DIAGNOSIS — I87332 Chronic venous hypertension (idiopathic) with ulcer and inflammation of left lower extremity: Secondary | ICD-10-CM | POA: Diagnosis not present

## 2016-02-06 DIAGNOSIS — I5022 Chronic systolic (congestive) heart failure: Secondary | ICD-10-CM | POA: Diagnosis not present

## 2016-02-06 DIAGNOSIS — I890 Lymphedema, not elsewhere classified: Secondary | ICD-10-CM | POA: Diagnosis not present

## 2016-02-06 DIAGNOSIS — Z7902 Long term (current) use of antithrombotics/antiplatelets: Secondary | ICD-10-CM | POA: Diagnosis not present

## 2016-02-06 DIAGNOSIS — Z7982 Long term (current) use of aspirin: Secondary | ICD-10-CM | POA: Diagnosis not present

## 2016-02-06 DIAGNOSIS — Z7984 Long term (current) use of oral hypoglycemic drugs: Secondary | ICD-10-CM | POA: Diagnosis not present

## 2016-02-06 DIAGNOSIS — L97222 Non-pressure chronic ulcer of left calf with fat layer exposed: Secondary | ICD-10-CM | POA: Diagnosis not present

## 2016-02-06 DIAGNOSIS — E11622 Type 2 diabetes mellitus with other skin ulcer: Secondary | ICD-10-CM | POA: Diagnosis not present

## 2016-02-06 DIAGNOSIS — E1159 Type 2 diabetes mellitus with other circulatory complications: Secondary | ICD-10-CM | POA: Diagnosis not present

## 2016-02-06 DIAGNOSIS — Z4800 Encounter for change or removal of nonsurgical wound dressing: Secondary | ICD-10-CM | POA: Diagnosis not present

## 2016-02-06 DIAGNOSIS — L97322 Non-pressure chronic ulcer of left ankle with fat layer exposed: Secondary | ICD-10-CM | POA: Diagnosis not present

## 2016-02-09 DIAGNOSIS — E11622 Type 2 diabetes mellitus with other skin ulcer: Secondary | ICD-10-CM | POA: Diagnosis not present

## 2016-02-09 DIAGNOSIS — Z4800 Encounter for change or removal of nonsurgical wound dressing: Secondary | ICD-10-CM | POA: Diagnosis not present

## 2016-02-09 DIAGNOSIS — Z7984 Long term (current) use of oral hypoglycemic drugs: Secondary | ICD-10-CM | POA: Diagnosis not present

## 2016-02-09 DIAGNOSIS — L97222 Non-pressure chronic ulcer of left calf with fat layer exposed: Secondary | ICD-10-CM | POA: Diagnosis not present

## 2016-02-09 DIAGNOSIS — I890 Lymphedema, not elsewhere classified: Secondary | ICD-10-CM | POA: Diagnosis not present

## 2016-02-09 DIAGNOSIS — Z7902 Long term (current) use of antithrombotics/antiplatelets: Secondary | ICD-10-CM | POA: Diagnosis not present

## 2016-02-09 DIAGNOSIS — I5022 Chronic systolic (congestive) heart failure: Secondary | ICD-10-CM | POA: Diagnosis not present

## 2016-02-09 DIAGNOSIS — I87332 Chronic venous hypertension (idiopathic) with ulcer and inflammation of left lower extremity: Secondary | ICD-10-CM | POA: Diagnosis not present

## 2016-02-09 DIAGNOSIS — E1159 Type 2 diabetes mellitus with other circulatory complications: Secondary | ICD-10-CM | POA: Diagnosis not present

## 2016-02-09 DIAGNOSIS — Z7982 Long term (current) use of aspirin: Secondary | ICD-10-CM | POA: Diagnosis not present

## 2016-02-09 DIAGNOSIS — Z9981 Dependence on supplemental oxygen: Secondary | ICD-10-CM | POA: Diagnosis not present

## 2016-02-10 DIAGNOSIS — E781 Pure hyperglyceridemia: Secondary | ICD-10-CM | POA: Diagnosis not present

## 2016-02-10 DIAGNOSIS — E784 Other hyperlipidemia: Secondary | ICD-10-CM | POA: Diagnosis not present

## 2016-02-10 DIAGNOSIS — E119 Type 2 diabetes mellitus without complications: Secondary | ICD-10-CM | POA: Diagnosis not present

## 2016-02-10 DIAGNOSIS — I10 Essential (primary) hypertension: Secondary | ICD-10-CM | POA: Diagnosis not present

## 2016-02-10 DIAGNOSIS — I509 Heart failure, unspecified: Secondary | ICD-10-CM | POA: Diagnosis not present

## 2016-02-10 DIAGNOSIS — I158 Other secondary hypertension: Secondary | ICD-10-CM | POA: Diagnosis not present

## 2016-02-13 DIAGNOSIS — L97222 Non-pressure chronic ulcer of left calf with fat layer exposed: Secondary | ICD-10-CM | POA: Diagnosis not present

## 2016-02-13 DIAGNOSIS — E11622 Type 2 diabetes mellitus with other skin ulcer: Secondary | ICD-10-CM | POA: Diagnosis not present

## 2016-02-13 DIAGNOSIS — I87332 Chronic venous hypertension (idiopathic) with ulcer and inflammation of left lower extremity: Secondary | ICD-10-CM | POA: Diagnosis not present

## 2016-02-13 DIAGNOSIS — Z4800 Encounter for change or removal of nonsurgical wound dressing: Secondary | ICD-10-CM | POA: Diagnosis not present

## 2016-02-13 DIAGNOSIS — Z7982 Long term (current) use of aspirin: Secondary | ICD-10-CM | POA: Diagnosis not present

## 2016-02-13 DIAGNOSIS — Z7984 Long term (current) use of oral hypoglycemic drugs: Secondary | ICD-10-CM | POA: Diagnosis not present

## 2016-02-13 DIAGNOSIS — Z7902 Long term (current) use of antithrombotics/antiplatelets: Secondary | ICD-10-CM | POA: Diagnosis not present

## 2016-02-13 DIAGNOSIS — I5022 Chronic systolic (congestive) heart failure: Secondary | ICD-10-CM | POA: Diagnosis not present

## 2016-02-13 DIAGNOSIS — Z9981 Dependence on supplemental oxygen: Secondary | ICD-10-CM | POA: Diagnosis not present

## 2016-02-13 DIAGNOSIS — E1159 Type 2 diabetes mellitus with other circulatory complications: Secondary | ICD-10-CM | POA: Diagnosis not present

## 2016-02-13 DIAGNOSIS — I890 Lymphedema, not elsewhere classified: Secondary | ICD-10-CM | POA: Diagnosis not present

## 2016-02-17 DIAGNOSIS — Z9981 Dependence on supplemental oxygen: Secondary | ICD-10-CM | POA: Diagnosis not present

## 2016-02-17 DIAGNOSIS — Z4800 Encounter for change or removal of nonsurgical wound dressing: Secondary | ICD-10-CM | POA: Diagnosis not present

## 2016-02-17 DIAGNOSIS — Z7902 Long term (current) use of antithrombotics/antiplatelets: Secondary | ICD-10-CM | POA: Diagnosis not present

## 2016-02-17 DIAGNOSIS — I87332 Chronic venous hypertension (idiopathic) with ulcer and inflammation of left lower extremity: Secondary | ICD-10-CM | POA: Diagnosis not present

## 2016-02-17 DIAGNOSIS — L97222 Non-pressure chronic ulcer of left calf with fat layer exposed: Secondary | ICD-10-CM | POA: Diagnosis not present

## 2016-02-17 DIAGNOSIS — Z7982 Long term (current) use of aspirin: Secondary | ICD-10-CM | POA: Diagnosis not present

## 2016-02-17 DIAGNOSIS — I5022 Chronic systolic (congestive) heart failure: Secondary | ICD-10-CM | POA: Diagnosis not present

## 2016-02-17 DIAGNOSIS — E11622 Type 2 diabetes mellitus with other skin ulcer: Secondary | ICD-10-CM | POA: Diagnosis not present

## 2016-02-17 DIAGNOSIS — I890 Lymphedema, not elsewhere classified: Secondary | ICD-10-CM | POA: Diagnosis not present

## 2016-02-17 DIAGNOSIS — E1159 Type 2 diabetes mellitus with other circulatory complications: Secondary | ICD-10-CM | POA: Diagnosis not present

## 2016-02-17 DIAGNOSIS — Z7984 Long term (current) use of oral hypoglycemic drugs: Secondary | ICD-10-CM | POA: Diagnosis not present

## 2016-02-18 DIAGNOSIS — I5022 Chronic systolic (congestive) heart failure: Secondary | ICD-10-CM | POA: Diagnosis not present

## 2016-02-18 DIAGNOSIS — Z7902 Long term (current) use of antithrombotics/antiplatelets: Secondary | ICD-10-CM | POA: Diagnosis not present

## 2016-02-18 DIAGNOSIS — E1159 Type 2 diabetes mellitus with other circulatory complications: Secondary | ICD-10-CM | POA: Diagnosis not present

## 2016-02-18 DIAGNOSIS — I87332 Chronic venous hypertension (idiopathic) with ulcer and inflammation of left lower extremity: Secondary | ICD-10-CM | POA: Diagnosis not present

## 2016-02-18 DIAGNOSIS — I890 Lymphedema, not elsewhere classified: Secondary | ICD-10-CM | POA: Diagnosis not present

## 2016-02-18 DIAGNOSIS — E11622 Type 2 diabetes mellitus with other skin ulcer: Secondary | ICD-10-CM | POA: Diagnosis not present

## 2016-02-18 DIAGNOSIS — Z9981 Dependence on supplemental oxygen: Secondary | ICD-10-CM | POA: Diagnosis not present

## 2016-02-18 DIAGNOSIS — L97222 Non-pressure chronic ulcer of left calf with fat layer exposed: Secondary | ICD-10-CM | POA: Diagnosis not present

## 2016-02-18 DIAGNOSIS — Z4800 Encounter for change or removal of nonsurgical wound dressing: Secondary | ICD-10-CM | POA: Diagnosis not present

## 2016-02-18 DIAGNOSIS — Z7982 Long term (current) use of aspirin: Secondary | ICD-10-CM | POA: Diagnosis not present

## 2016-02-18 DIAGNOSIS — Z7984 Long term (current) use of oral hypoglycemic drugs: Secondary | ICD-10-CM | POA: Diagnosis not present

## 2016-02-20 DIAGNOSIS — L97222 Non-pressure chronic ulcer of left calf with fat layer exposed: Secondary | ICD-10-CM | POA: Diagnosis not present

## 2016-02-20 DIAGNOSIS — Z7984 Long term (current) use of oral hypoglycemic drugs: Secondary | ICD-10-CM | POA: Diagnosis not present

## 2016-02-20 DIAGNOSIS — I890 Lymphedema, not elsewhere classified: Secondary | ICD-10-CM | POA: Diagnosis not present

## 2016-02-20 DIAGNOSIS — E11622 Type 2 diabetes mellitus with other skin ulcer: Secondary | ICD-10-CM | POA: Diagnosis not present

## 2016-02-20 DIAGNOSIS — Z4800 Encounter for change or removal of nonsurgical wound dressing: Secondary | ICD-10-CM | POA: Diagnosis not present

## 2016-02-20 DIAGNOSIS — Z9981 Dependence on supplemental oxygen: Secondary | ICD-10-CM | POA: Diagnosis not present

## 2016-02-20 DIAGNOSIS — I5022 Chronic systolic (congestive) heart failure: Secondary | ICD-10-CM | POA: Diagnosis not present

## 2016-02-20 DIAGNOSIS — Z7982 Long term (current) use of aspirin: Secondary | ICD-10-CM | POA: Diagnosis not present

## 2016-02-20 DIAGNOSIS — I87332 Chronic venous hypertension (idiopathic) with ulcer and inflammation of left lower extremity: Secondary | ICD-10-CM | POA: Diagnosis not present

## 2016-02-20 DIAGNOSIS — Z7902 Long term (current) use of antithrombotics/antiplatelets: Secondary | ICD-10-CM | POA: Diagnosis not present

## 2016-02-20 DIAGNOSIS — E1159 Type 2 diabetes mellitus with other circulatory complications: Secondary | ICD-10-CM | POA: Diagnosis not present

## 2016-02-23 DIAGNOSIS — Z4800 Encounter for change or removal of nonsurgical wound dressing: Secondary | ICD-10-CM | POA: Diagnosis not present

## 2016-02-23 DIAGNOSIS — L97222 Non-pressure chronic ulcer of left calf with fat layer exposed: Secondary | ICD-10-CM | POA: Diagnosis not present

## 2016-02-23 DIAGNOSIS — I87332 Chronic venous hypertension (idiopathic) with ulcer and inflammation of left lower extremity: Secondary | ICD-10-CM | POA: Diagnosis not present

## 2016-02-23 DIAGNOSIS — Z7984 Long term (current) use of oral hypoglycemic drugs: Secondary | ICD-10-CM | POA: Diagnosis not present

## 2016-02-23 DIAGNOSIS — I5022 Chronic systolic (congestive) heart failure: Secondary | ICD-10-CM | POA: Diagnosis not present

## 2016-02-23 DIAGNOSIS — E1159 Type 2 diabetes mellitus with other circulatory complications: Secondary | ICD-10-CM | POA: Diagnosis not present

## 2016-02-23 DIAGNOSIS — Z9981 Dependence on supplemental oxygen: Secondary | ICD-10-CM | POA: Diagnosis not present

## 2016-02-23 DIAGNOSIS — I890 Lymphedema, not elsewhere classified: Secondary | ICD-10-CM | POA: Diagnosis not present

## 2016-02-23 DIAGNOSIS — Z7982 Long term (current) use of aspirin: Secondary | ICD-10-CM | POA: Diagnosis not present

## 2016-02-23 DIAGNOSIS — E11622 Type 2 diabetes mellitus with other skin ulcer: Secondary | ICD-10-CM | POA: Diagnosis not present

## 2016-02-23 DIAGNOSIS — Z7902 Long term (current) use of antithrombotics/antiplatelets: Secondary | ICD-10-CM | POA: Diagnosis not present

## 2016-02-24 DIAGNOSIS — L97222 Non-pressure chronic ulcer of left calf with fat layer exposed: Secondary | ICD-10-CM | POA: Diagnosis not present

## 2016-02-24 DIAGNOSIS — Z4800 Encounter for change or removal of nonsurgical wound dressing: Secondary | ICD-10-CM | POA: Diagnosis not present

## 2016-02-24 DIAGNOSIS — Z7982 Long term (current) use of aspirin: Secondary | ICD-10-CM | POA: Diagnosis not present

## 2016-02-24 DIAGNOSIS — I5022 Chronic systolic (congestive) heart failure: Secondary | ICD-10-CM | POA: Diagnosis not present

## 2016-02-24 DIAGNOSIS — Z7984 Long term (current) use of oral hypoglycemic drugs: Secondary | ICD-10-CM | POA: Diagnosis not present

## 2016-02-24 DIAGNOSIS — E1159 Type 2 diabetes mellitus with other circulatory complications: Secondary | ICD-10-CM | POA: Diagnosis not present

## 2016-02-24 DIAGNOSIS — Z9981 Dependence on supplemental oxygen: Secondary | ICD-10-CM | POA: Diagnosis not present

## 2016-02-24 DIAGNOSIS — I890 Lymphedema, not elsewhere classified: Secondary | ICD-10-CM | POA: Diagnosis not present

## 2016-02-24 DIAGNOSIS — E11622 Type 2 diabetes mellitus with other skin ulcer: Secondary | ICD-10-CM | POA: Diagnosis not present

## 2016-02-24 DIAGNOSIS — I87332 Chronic venous hypertension (idiopathic) with ulcer and inflammation of left lower extremity: Secondary | ICD-10-CM | POA: Diagnosis not present

## 2016-02-24 DIAGNOSIS — Z7902 Long term (current) use of antithrombotics/antiplatelets: Secondary | ICD-10-CM | POA: Diagnosis not present

## 2016-02-25 DIAGNOSIS — Z9981 Dependence on supplemental oxygen: Secondary | ICD-10-CM | POA: Diagnosis not present

## 2016-02-25 DIAGNOSIS — E11622 Type 2 diabetes mellitus with other skin ulcer: Secondary | ICD-10-CM | POA: Diagnosis not present

## 2016-02-25 DIAGNOSIS — I87332 Chronic venous hypertension (idiopathic) with ulcer and inflammation of left lower extremity: Secondary | ICD-10-CM | POA: Diagnosis not present

## 2016-02-25 DIAGNOSIS — E1159 Type 2 diabetes mellitus with other circulatory complications: Secondary | ICD-10-CM | POA: Diagnosis not present

## 2016-02-25 DIAGNOSIS — Z7982 Long term (current) use of aspirin: Secondary | ICD-10-CM | POA: Diagnosis not present

## 2016-02-25 DIAGNOSIS — Z7902 Long term (current) use of antithrombotics/antiplatelets: Secondary | ICD-10-CM | POA: Diagnosis not present

## 2016-02-25 DIAGNOSIS — I5022 Chronic systolic (congestive) heart failure: Secondary | ICD-10-CM | POA: Diagnosis not present

## 2016-02-25 DIAGNOSIS — I890 Lymphedema, not elsewhere classified: Secondary | ICD-10-CM | POA: Diagnosis not present

## 2016-02-25 DIAGNOSIS — L97222 Non-pressure chronic ulcer of left calf with fat layer exposed: Secondary | ICD-10-CM | POA: Diagnosis not present

## 2016-02-25 DIAGNOSIS — Z4800 Encounter for change or removal of nonsurgical wound dressing: Secondary | ICD-10-CM | POA: Diagnosis not present

## 2016-02-25 DIAGNOSIS — Z7984 Long term (current) use of oral hypoglycemic drugs: Secondary | ICD-10-CM | POA: Diagnosis not present

## 2016-02-27 DIAGNOSIS — I87332 Chronic venous hypertension (idiopathic) with ulcer and inflammation of left lower extremity: Secondary | ICD-10-CM | POA: Diagnosis not present

## 2016-02-27 DIAGNOSIS — Z4800 Encounter for change or removal of nonsurgical wound dressing: Secondary | ICD-10-CM | POA: Diagnosis not present

## 2016-02-27 DIAGNOSIS — Z9981 Dependence on supplemental oxygen: Secondary | ICD-10-CM | POA: Diagnosis not present

## 2016-02-27 DIAGNOSIS — I5022 Chronic systolic (congestive) heart failure: Secondary | ICD-10-CM | POA: Diagnosis not present

## 2016-02-27 DIAGNOSIS — Z7982 Long term (current) use of aspirin: Secondary | ICD-10-CM | POA: Diagnosis not present

## 2016-02-27 DIAGNOSIS — Z7902 Long term (current) use of antithrombotics/antiplatelets: Secondary | ICD-10-CM | POA: Diagnosis not present

## 2016-02-27 DIAGNOSIS — L97222 Non-pressure chronic ulcer of left calf with fat layer exposed: Secondary | ICD-10-CM | POA: Diagnosis not present

## 2016-02-27 DIAGNOSIS — Z7984 Long term (current) use of oral hypoglycemic drugs: Secondary | ICD-10-CM | POA: Diagnosis not present

## 2016-02-27 DIAGNOSIS — I890 Lymphedema, not elsewhere classified: Secondary | ICD-10-CM | POA: Diagnosis not present

## 2016-02-27 DIAGNOSIS — E1159 Type 2 diabetes mellitus with other circulatory complications: Secondary | ICD-10-CM | POA: Diagnosis not present

## 2016-02-27 DIAGNOSIS — E11622 Type 2 diabetes mellitus with other skin ulcer: Secondary | ICD-10-CM | POA: Diagnosis not present

## 2016-03-01 DIAGNOSIS — I890 Lymphedema, not elsewhere classified: Secondary | ICD-10-CM | POA: Diagnosis not present

## 2016-03-01 DIAGNOSIS — E11622 Type 2 diabetes mellitus with other skin ulcer: Secondary | ICD-10-CM | POA: Diagnosis not present

## 2016-03-01 DIAGNOSIS — E1159 Type 2 diabetes mellitus with other circulatory complications: Secondary | ICD-10-CM | POA: Diagnosis not present

## 2016-03-01 DIAGNOSIS — Z9981 Dependence on supplemental oxygen: Secondary | ICD-10-CM | POA: Diagnosis not present

## 2016-03-01 DIAGNOSIS — Z4800 Encounter for change or removal of nonsurgical wound dressing: Secondary | ICD-10-CM | POA: Diagnosis not present

## 2016-03-01 DIAGNOSIS — Z7984 Long term (current) use of oral hypoglycemic drugs: Secondary | ICD-10-CM | POA: Diagnosis not present

## 2016-03-01 DIAGNOSIS — Z7982 Long term (current) use of aspirin: Secondary | ICD-10-CM | POA: Diagnosis not present

## 2016-03-01 DIAGNOSIS — Z7902 Long term (current) use of antithrombotics/antiplatelets: Secondary | ICD-10-CM | POA: Diagnosis not present

## 2016-03-01 DIAGNOSIS — L97222 Non-pressure chronic ulcer of left calf with fat layer exposed: Secondary | ICD-10-CM | POA: Diagnosis not present

## 2016-03-01 DIAGNOSIS — I87332 Chronic venous hypertension (idiopathic) with ulcer and inflammation of left lower extremity: Secondary | ICD-10-CM | POA: Diagnosis not present

## 2016-03-01 DIAGNOSIS — I5022 Chronic systolic (congestive) heart failure: Secondary | ICD-10-CM | POA: Diagnosis not present

## 2016-03-02 DIAGNOSIS — Z7902 Long term (current) use of antithrombotics/antiplatelets: Secondary | ICD-10-CM | POA: Diagnosis not present

## 2016-03-02 DIAGNOSIS — E11622 Type 2 diabetes mellitus with other skin ulcer: Secondary | ICD-10-CM | POA: Diagnosis not present

## 2016-03-02 DIAGNOSIS — I890 Lymphedema, not elsewhere classified: Secondary | ICD-10-CM | POA: Diagnosis not present

## 2016-03-02 DIAGNOSIS — L97222 Non-pressure chronic ulcer of left calf with fat layer exposed: Secondary | ICD-10-CM | POA: Diagnosis not present

## 2016-03-02 DIAGNOSIS — Z4800 Encounter for change or removal of nonsurgical wound dressing: Secondary | ICD-10-CM | POA: Diagnosis not present

## 2016-03-02 DIAGNOSIS — Z7984 Long term (current) use of oral hypoglycemic drugs: Secondary | ICD-10-CM | POA: Diagnosis not present

## 2016-03-02 DIAGNOSIS — I87332 Chronic venous hypertension (idiopathic) with ulcer and inflammation of left lower extremity: Secondary | ICD-10-CM | POA: Diagnosis not present

## 2016-03-02 DIAGNOSIS — Z7982 Long term (current) use of aspirin: Secondary | ICD-10-CM | POA: Diagnosis not present

## 2016-03-02 DIAGNOSIS — I5022 Chronic systolic (congestive) heart failure: Secondary | ICD-10-CM | POA: Diagnosis not present

## 2016-03-02 DIAGNOSIS — Z9981 Dependence on supplemental oxygen: Secondary | ICD-10-CM | POA: Diagnosis not present

## 2016-03-02 DIAGNOSIS — E1159 Type 2 diabetes mellitus with other circulatory complications: Secondary | ICD-10-CM | POA: Diagnosis not present

## 2016-03-03 DIAGNOSIS — E1159 Type 2 diabetes mellitus with other circulatory complications: Secondary | ICD-10-CM | POA: Diagnosis not present

## 2016-03-03 DIAGNOSIS — I87332 Chronic venous hypertension (idiopathic) with ulcer and inflammation of left lower extremity: Secondary | ICD-10-CM | POA: Diagnosis not present

## 2016-03-03 DIAGNOSIS — Z7982 Long term (current) use of aspirin: Secondary | ICD-10-CM | POA: Diagnosis not present

## 2016-03-03 DIAGNOSIS — Z7902 Long term (current) use of antithrombotics/antiplatelets: Secondary | ICD-10-CM | POA: Diagnosis not present

## 2016-03-03 DIAGNOSIS — I5022 Chronic systolic (congestive) heart failure: Secondary | ICD-10-CM | POA: Diagnosis not present

## 2016-03-03 DIAGNOSIS — E11622 Type 2 diabetes mellitus with other skin ulcer: Secondary | ICD-10-CM | POA: Diagnosis not present

## 2016-03-03 DIAGNOSIS — L97222 Non-pressure chronic ulcer of left calf with fat layer exposed: Secondary | ICD-10-CM | POA: Diagnosis not present

## 2016-03-03 DIAGNOSIS — I890 Lymphedema, not elsewhere classified: Secondary | ICD-10-CM | POA: Diagnosis not present

## 2016-03-03 DIAGNOSIS — Z4800 Encounter for change or removal of nonsurgical wound dressing: Secondary | ICD-10-CM | POA: Diagnosis not present

## 2016-03-03 DIAGNOSIS — Z7984 Long term (current) use of oral hypoglycemic drugs: Secondary | ICD-10-CM | POA: Diagnosis not present

## 2016-03-03 DIAGNOSIS — Z9981 Dependence on supplemental oxygen: Secondary | ICD-10-CM | POA: Diagnosis not present

## 2016-03-05 DIAGNOSIS — M160 Bilateral primary osteoarthritis of hip: Secondary | ICD-10-CM | POA: Diagnosis not present

## 2016-03-07 DIAGNOSIS — I5022 Chronic systolic (congestive) heart failure: Secondary | ICD-10-CM | POA: Diagnosis not present

## 2016-03-07 DIAGNOSIS — Z4800 Encounter for change or removal of nonsurgical wound dressing: Secondary | ICD-10-CM | POA: Diagnosis not present

## 2016-03-07 DIAGNOSIS — E11622 Type 2 diabetes mellitus with other skin ulcer: Secondary | ICD-10-CM | POA: Diagnosis not present

## 2016-03-07 DIAGNOSIS — Z7982 Long term (current) use of aspirin: Secondary | ICD-10-CM | POA: Diagnosis not present

## 2016-03-07 DIAGNOSIS — I87332 Chronic venous hypertension (idiopathic) with ulcer and inflammation of left lower extremity: Secondary | ICD-10-CM | POA: Diagnosis not present

## 2016-03-07 DIAGNOSIS — Z7902 Long term (current) use of antithrombotics/antiplatelets: Secondary | ICD-10-CM | POA: Diagnosis not present

## 2016-03-07 DIAGNOSIS — L97222 Non-pressure chronic ulcer of left calf with fat layer exposed: Secondary | ICD-10-CM | POA: Diagnosis not present

## 2016-03-07 DIAGNOSIS — I890 Lymphedema, not elsewhere classified: Secondary | ICD-10-CM | POA: Diagnosis not present

## 2016-03-07 DIAGNOSIS — Z7984 Long term (current) use of oral hypoglycemic drugs: Secondary | ICD-10-CM | POA: Diagnosis not present

## 2016-03-07 DIAGNOSIS — Z9981 Dependence on supplemental oxygen: Secondary | ICD-10-CM | POA: Diagnosis not present

## 2016-03-07 DIAGNOSIS — E1159 Type 2 diabetes mellitus with other circulatory complications: Secondary | ICD-10-CM | POA: Diagnosis not present

## 2016-03-08 DIAGNOSIS — L97222 Non-pressure chronic ulcer of left calf with fat layer exposed: Secondary | ICD-10-CM | POA: Diagnosis not present

## 2016-03-08 DIAGNOSIS — I890 Lymphedema, not elsewhere classified: Secondary | ICD-10-CM | POA: Diagnosis not present

## 2016-03-08 DIAGNOSIS — I5022 Chronic systolic (congestive) heart failure: Secondary | ICD-10-CM | POA: Diagnosis not present

## 2016-03-08 DIAGNOSIS — Z7984 Long term (current) use of oral hypoglycemic drugs: Secondary | ICD-10-CM | POA: Diagnosis not present

## 2016-03-08 DIAGNOSIS — Z4800 Encounter for change or removal of nonsurgical wound dressing: Secondary | ICD-10-CM | POA: Diagnosis not present

## 2016-03-08 DIAGNOSIS — Z7982 Long term (current) use of aspirin: Secondary | ICD-10-CM | POA: Diagnosis not present

## 2016-03-08 DIAGNOSIS — E11622 Type 2 diabetes mellitus with other skin ulcer: Secondary | ICD-10-CM | POA: Diagnosis not present

## 2016-03-08 DIAGNOSIS — E1159 Type 2 diabetes mellitus with other circulatory complications: Secondary | ICD-10-CM | POA: Diagnosis not present

## 2016-03-08 DIAGNOSIS — Z7902 Long term (current) use of antithrombotics/antiplatelets: Secondary | ICD-10-CM | POA: Diagnosis not present

## 2016-03-08 DIAGNOSIS — I87332 Chronic venous hypertension (idiopathic) with ulcer and inflammation of left lower extremity: Secondary | ICD-10-CM | POA: Diagnosis not present

## 2016-03-08 DIAGNOSIS — Z9981 Dependence on supplemental oxygen: Secondary | ICD-10-CM | POA: Diagnosis not present

## 2016-03-09 DIAGNOSIS — L97222 Non-pressure chronic ulcer of left calf with fat layer exposed: Secondary | ICD-10-CM | POA: Diagnosis not present

## 2016-03-09 DIAGNOSIS — Z4800 Encounter for change or removal of nonsurgical wound dressing: Secondary | ICD-10-CM | POA: Diagnosis not present

## 2016-03-09 DIAGNOSIS — Z7902 Long term (current) use of antithrombotics/antiplatelets: Secondary | ICD-10-CM | POA: Diagnosis not present

## 2016-03-09 DIAGNOSIS — I87332 Chronic venous hypertension (idiopathic) with ulcer and inflammation of left lower extremity: Secondary | ICD-10-CM | POA: Diagnosis not present

## 2016-03-09 DIAGNOSIS — E1159 Type 2 diabetes mellitus with other circulatory complications: Secondary | ICD-10-CM | POA: Diagnosis not present

## 2016-03-09 DIAGNOSIS — E11622 Type 2 diabetes mellitus with other skin ulcer: Secondary | ICD-10-CM | POA: Diagnosis not present

## 2016-03-09 DIAGNOSIS — Z9981 Dependence on supplemental oxygen: Secondary | ICD-10-CM | POA: Diagnosis not present

## 2016-03-09 DIAGNOSIS — Z7984 Long term (current) use of oral hypoglycemic drugs: Secondary | ICD-10-CM | POA: Diagnosis not present

## 2016-03-09 DIAGNOSIS — I890 Lymphedema, not elsewhere classified: Secondary | ICD-10-CM | POA: Diagnosis not present

## 2016-03-09 DIAGNOSIS — I5022 Chronic systolic (congestive) heart failure: Secondary | ICD-10-CM | POA: Diagnosis not present

## 2016-03-09 DIAGNOSIS — Z7982 Long term (current) use of aspirin: Secondary | ICD-10-CM | POA: Diagnosis not present

## 2016-03-10 DIAGNOSIS — E1159 Type 2 diabetes mellitus with other circulatory complications: Secondary | ICD-10-CM | POA: Diagnosis not present

## 2016-03-10 DIAGNOSIS — L97222 Non-pressure chronic ulcer of left calf with fat layer exposed: Secondary | ICD-10-CM | POA: Diagnosis not present

## 2016-03-10 DIAGNOSIS — E11622 Type 2 diabetes mellitus with other skin ulcer: Secondary | ICD-10-CM | POA: Diagnosis not present

## 2016-03-10 DIAGNOSIS — Z7902 Long term (current) use of antithrombotics/antiplatelets: Secondary | ICD-10-CM | POA: Diagnosis not present

## 2016-03-10 DIAGNOSIS — Z9981 Dependence on supplemental oxygen: Secondary | ICD-10-CM | POA: Diagnosis not present

## 2016-03-10 DIAGNOSIS — Z7984 Long term (current) use of oral hypoglycemic drugs: Secondary | ICD-10-CM | POA: Diagnosis not present

## 2016-03-10 DIAGNOSIS — I87332 Chronic venous hypertension (idiopathic) with ulcer and inflammation of left lower extremity: Secondary | ICD-10-CM | POA: Diagnosis not present

## 2016-03-10 DIAGNOSIS — Z7982 Long term (current) use of aspirin: Secondary | ICD-10-CM | POA: Diagnosis not present

## 2016-03-10 DIAGNOSIS — I890 Lymphedema, not elsewhere classified: Secondary | ICD-10-CM | POA: Diagnosis not present

## 2016-03-10 DIAGNOSIS — I5022 Chronic systolic (congestive) heart failure: Secondary | ICD-10-CM | POA: Diagnosis not present

## 2016-03-10 DIAGNOSIS — Z4800 Encounter for change or removal of nonsurgical wound dressing: Secondary | ICD-10-CM | POA: Diagnosis not present

## 2016-03-11 DIAGNOSIS — I509 Heart failure, unspecified: Secondary | ICD-10-CM | POA: Diagnosis not present

## 2016-03-12 DIAGNOSIS — Z4800 Encounter for change or removal of nonsurgical wound dressing: Secondary | ICD-10-CM | POA: Diagnosis not present

## 2016-03-12 DIAGNOSIS — I890 Lymphedema, not elsewhere classified: Secondary | ICD-10-CM | POA: Diagnosis not present

## 2016-03-12 DIAGNOSIS — L97222 Non-pressure chronic ulcer of left calf with fat layer exposed: Secondary | ICD-10-CM | POA: Diagnosis not present

## 2016-03-12 DIAGNOSIS — I87332 Chronic venous hypertension (idiopathic) with ulcer and inflammation of left lower extremity: Secondary | ICD-10-CM | POA: Diagnosis not present

## 2016-03-12 DIAGNOSIS — E1159 Type 2 diabetes mellitus with other circulatory complications: Secondary | ICD-10-CM | POA: Diagnosis not present

## 2016-03-12 DIAGNOSIS — E11622 Type 2 diabetes mellitus with other skin ulcer: Secondary | ICD-10-CM | POA: Diagnosis not present

## 2016-03-12 DIAGNOSIS — I5022 Chronic systolic (congestive) heart failure: Secondary | ICD-10-CM | POA: Diagnosis not present

## 2016-03-12 DIAGNOSIS — Z7984 Long term (current) use of oral hypoglycemic drugs: Secondary | ICD-10-CM | POA: Diagnosis not present

## 2016-03-12 DIAGNOSIS — Z7902 Long term (current) use of antithrombotics/antiplatelets: Secondary | ICD-10-CM | POA: Diagnosis not present

## 2016-03-12 DIAGNOSIS — Z9981 Dependence on supplemental oxygen: Secondary | ICD-10-CM | POA: Diagnosis not present

## 2016-03-12 DIAGNOSIS — Z7982 Long term (current) use of aspirin: Secondary | ICD-10-CM | POA: Diagnosis not present

## 2016-03-12 IMAGING — CR DG CHEST 1V PORT
1 series · 1 of 1 positions shown · non-contrast
Comparison: Chest radiograph performed 11/18/2013

CLINICAL DATA: Acute onset of epigastric abdominal pain and
vomiting. Initial encounter.

EXAM:
PORTABLE CHEST 1 VIEW

[portable]
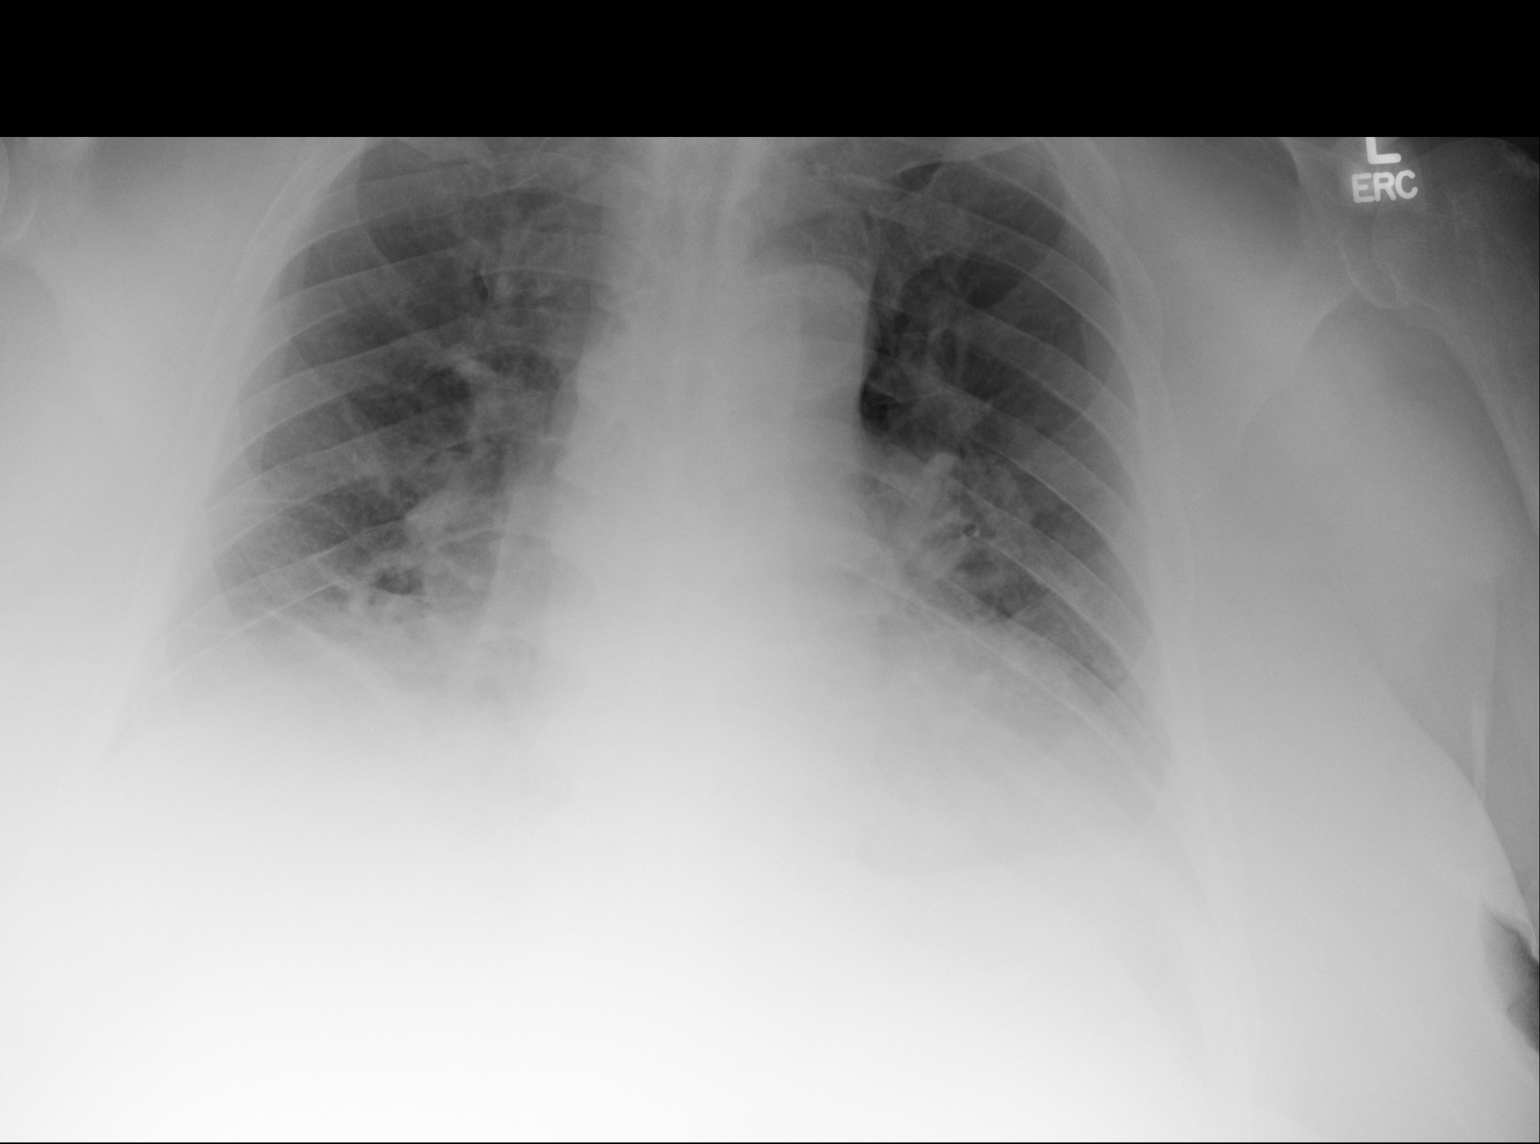

[1 of 1 positions shown; findings below may reference images not displayed]

FINDINGS: The lungs are hypoexpanded. Vascular congestion is noted. Mild
bibasilar opacities may reflect atelectasis or possibly mild
pneumonia. There is no evidence of pleural effusion or pneumothorax.

The cardiomediastinal silhouette is borderline normal in size. No
acute osseous abnormalities are seen.
IMPRESSION: Lungs hypoexpanded. Vascular congestion noted. Mild bibasilar
opacities may reflect atelectasis or possibly mild pneumonia.

## 2016-03-15 DIAGNOSIS — E11622 Type 2 diabetes mellitus with other skin ulcer: Secondary | ICD-10-CM | POA: Diagnosis not present

## 2016-03-15 DIAGNOSIS — Z7982 Long term (current) use of aspirin: Secondary | ICD-10-CM | POA: Diagnosis not present

## 2016-03-15 DIAGNOSIS — I890 Lymphedema, not elsewhere classified: Secondary | ICD-10-CM | POA: Diagnosis not present

## 2016-03-15 DIAGNOSIS — E1159 Type 2 diabetes mellitus with other circulatory complications: Secondary | ICD-10-CM | POA: Diagnosis not present

## 2016-03-15 DIAGNOSIS — Z7902 Long term (current) use of antithrombotics/antiplatelets: Secondary | ICD-10-CM | POA: Diagnosis not present

## 2016-03-15 DIAGNOSIS — Z4800 Encounter for change or removal of nonsurgical wound dressing: Secondary | ICD-10-CM | POA: Diagnosis not present

## 2016-03-15 DIAGNOSIS — L97222 Non-pressure chronic ulcer of left calf with fat layer exposed: Secondary | ICD-10-CM | POA: Diagnosis not present

## 2016-03-15 DIAGNOSIS — Z9981 Dependence on supplemental oxygen: Secondary | ICD-10-CM | POA: Diagnosis not present

## 2016-03-15 DIAGNOSIS — I5022 Chronic systolic (congestive) heart failure: Secondary | ICD-10-CM | POA: Diagnosis not present

## 2016-03-15 DIAGNOSIS — I87332 Chronic venous hypertension (idiopathic) with ulcer and inflammation of left lower extremity: Secondary | ICD-10-CM | POA: Diagnosis not present

## 2016-03-15 DIAGNOSIS — Z7984 Long term (current) use of oral hypoglycemic drugs: Secondary | ICD-10-CM | POA: Diagnosis not present

## 2016-03-16 DIAGNOSIS — L97222 Non-pressure chronic ulcer of left calf with fat layer exposed: Secondary | ICD-10-CM | POA: Diagnosis not present

## 2016-03-16 DIAGNOSIS — E1159 Type 2 diabetes mellitus with other circulatory complications: Secondary | ICD-10-CM | POA: Diagnosis not present

## 2016-03-16 DIAGNOSIS — Z7982 Long term (current) use of aspirin: Secondary | ICD-10-CM | POA: Diagnosis not present

## 2016-03-16 DIAGNOSIS — Z7984 Long term (current) use of oral hypoglycemic drugs: Secondary | ICD-10-CM | POA: Diagnosis not present

## 2016-03-16 DIAGNOSIS — Z9981 Dependence on supplemental oxygen: Secondary | ICD-10-CM | POA: Diagnosis not present

## 2016-03-16 DIAGNOSIS — E11622 Type 2 diabetes mellitus with other skin ulcer: Secondary | ICD-10-CM | POA: Diagnosis not present

## 2016-03-16 DIAGNOSIS — I5022 Chronic systolic (congestive) heart failure: Secondary | ICD-10-CM | POA: Diagnosis not present

## 2016-03-16 DIAGNOSIS — Z7902 Long term (current) use of antithrombotics/antiplatelets: Secondary | ICD-10-CM | POA: Diagnosis not present

## 2016-03-16 DIAGNOSIS — I87332 Chronic venous hypertension (idiopathic) with ulcer and inflammation of left lower extremity: Secondary | ICD-10-CM | POA: Diagnosis not present

## 2016-03-16 DIAGNOSIS — I890 Lymphedema, not elsewhere classified: Secondary | ICD-10-CM | POA: Diagnosis not present

## 2016-03-16 DIAGNOSIS — Z4800 Encounter for change or removal of nonsurgical wound dressing: Secondary | ICD-10-CM | POA: Diagnosis not present

## 2016-03-17 DIAGNOSIS — E11622 Type 2 diabetes mellitus with other skin ulcer: Secondary | ICD-10-CM | POA: Diagnosis not present

## 2016-03-17 DIAGNOSIS — Z9981 Dependence on supplemental oxygen: Secondary | ICD-10-CM | POA: Diagnosis not present

## 2016-03-17 DIAGNOSIS — I5022 Chronic systolic (congestive) heart failure: Secondary | ICD-10-CM | POA: Diagnosis not present

## 2016-03-17 DIAGNOSIS — Z7984 Long term (current) use of oral hypoglycemic drugs: Secondary | ICD-10-CM | POA: Diagnosis not present

## 2016-03-17 DIAGNOSIS — I87332 Chronic venous hypertension (idiopathic) with ulcer and inflammation of left lower extremity: Secondary | ICD-10-CM | POA: Diagnosis not present

## 2016-03-17 DIAGNOSIS — Z4800 Encounter for change or removal of nonsurgical wound dressing: Secondary | ICD-10-CM | POA: Diagnosis not present

## 2016-03-17 DIAGNOSIS — I890 Lymphedema, not elsewhere classified: Secondary | ICD-10-CM | POA: Diagnosis not present

## 2016-03-17 DIAGNOSIS — Z7902 Long term (current) use of antithrombotics/antiplatelets: Secondary | ICD-10-CM | POA: Diagnosis not present

## 2016-03-17 DIAGNOSIS — L97222 Non-pressure chronic ulcer of left calf with fat layer exposed: Secondary | ICD-10-CM | POA: Diagnosis not present

## 2016-03-17 DIAGNOSIS — E1159 Type 2 diabetes mellitus with other circulatory complications: Secondary | ICD-10-CM | POA: Diagnosis not present

## 2016-03-17 DIAGNOSIS — Z7982 Long term (current) use of aspirin: Secondary | ICD-10-CM | POA: Diagnosis not present

## 2016-03-22 DIAGNOSIS — Z7984 Long term (current) use of oral hypoglycemic drugs: Secondary | ICD-10-CM | POA: Diagnosis not present

## 2016-03-22 DIAGNOSIS — I890 Lymphedema, not elsewhere classified: Secondary | ICD-10-CM | POA: Diagnosis not present

## 2016-03-22 DIAGNOSIS — E11622 Type 2 diabetes mellitus with other skin ulcer: Secondary | ICD-10-CM | POA: Diagnosis not present

## 2016-03-22 DIAGNOSIS — I87332 Chronic venous hypertension (idiopathic) with ulcer and inflammation of left lower extremity: Secondary | ICD-10-CM | POA: Diagnosis not present

## 2016-03-22 DIAGNOSIS — Z4800 Encounter for change or removal of nonsurgical wound dressing: Secondary | ICD-10-CM | POA: Diagnosis not present

## 2016-03-22 DIAGNOSIS — I5022 Chronic systolic (congestive) heart failure: Secondary | ICD-10-CM | POA: Diagnosis not present

## 2016-03-22 DIAGNOSIS — Z9981 Dependence on supplemental oxygen: Secondary | ICD-10-CM | POA: Diagnosis not present

## 2016-03-22 DIAGNOSIS — L97222 Non-pressure chronic ulcer of left calf with fat layer exposed: Secondary | ICD-10-CM | POA: Diagnosis not present

## 2016-03-22 DIAGNOSIS — Z7902 Long term (current) use of antithrombotics/antiplatelets: Secondary | ICD-10-CM | POA: Diagnosis not present

## 2016-03-22 DIAGNOSIS — E1159 Type 2 diabetes mellitus with other circulatory complications: Secondary | ICD-10-CM | POA: Diagnosis not present

## 2016-03-22 DIAGNOSIS — Z7982 Long term (current) use of aspirin: Secondary | ICD-10-CM | POA: Diagnosis not present

## 2016-03-23 DIAGNOSIS — Z7984 Long term (current) use of oral hypoglycemic drugs: Secondary | ICD-10-CM | POA: Diagnosis not present

## 2016-03-23 DIAGNOSIS — I890 Lymphedema, not elsewhere classified: Secondary | ICD-10-CM | POA: Diagnosis not present

## 2016-03-23 DIAGNOSIS — L97222 Non-pressure chronic ulcer of left calf with fat layer exposed: Secondary | ICD-10-CM | POA: Diagnosis not present

## 2016-03-23 DIAGNOSIS — I87332 Chronic venous hypertension (idiopathic) with ulcer and inflammation of left lower extremity: Secondary | ICD-10-CM | POA: Diagnosis not present

## 2016-03-23 DIAGNOSIS — Z4800 Encounter for change or removal of nonsurgical wound dressing: Secondary | ICD-10-CM | POA: Diagnosis not present

## 2016-03-23 DIAGNOSIS — Z7982 Long term (current) use of aspirin: Secondary | ICD-10-CM | POA: Diagnosis not present

## 2016-03-23 DIAGNOSIS — Z7902 Long term (current) use of antithrombotics/antiplatelets: Secondary | ICD-10-CM | POA: Diagnosis not present

## 2016-03-23 DIAGNOSIS — E11622 Type 2 diabetes mellitus with other skin ulcer: Secondary | ICD-10-CM | POA: Diagnosis not present

## 2016-03-23 DIAGNOSIS — I5022 Chronic systolic (congestive) heart failure: Secondary | ICD-10-CM | POA: Diagnosis not present

## 2016-03-23 DIAGNOSIS — E1159 Type 2 diabetes mellitus with other circulatory complications: Secondary | ICD-10-CM | POA: Diagnosis not present

## 2016-03-23 DIAGNOSIS — Z9981 Dependence on supplemental oxygen: Secondary | ICD-10-CM | POA: Diagnosis not present

## 2016-03-24 DIAGNOSIS — Z7984 Long term (current) use of oral hypoglycemic drugs: Secondary | ICD-10-CM | POA: Diagnosis not present

## 2016-03-24 DIAGNOSIS — L97222 Non-pressure chronic ulcer of left calf with fat layer exposed: Secondary | ICD-10-CM | POA: Diagnosis not present

## 2016-03-24 DIAGNOSIS — E11622 Type 2 diabetes mellitus with other skin ulcer: Secondary | ICD-10-CM | POA: Diagnosis not present

## 2016-03-24 DIAGNOSIS — Z4800 Encounter for change or removal of nonsurgical wound dressing: Secondary | ICD-10-CM | POA: Diagnosis not present

## 2016-03-24 DIAGNOSIS — I87332 Chronic venous hypertension (idiopathic) with ulcer and inflammation of left lower extremity: Secondary | ICD-10-CM | POA: Diagnosis not present

## 2016-03-24 DIAGNOSIS — Z7982 Long term (current) use of aspirin: Secondary | ICD-10-CM | POA: Diagnosis not present

## 2016-03-24 DIAGNOSIS — I890 Lymphedema, not elsewhere classified: Secondary | ICD-10-CM | POA: Diagnosis not present

## 2016-03-24 DIAGNOSIS — I5022 Chronic systolic (congestive) heart failure: Secondary | ICD-10-CM | POA: Diagnosis not present

## 2016-03-24 DIAGNOSIS — Z9981 Dependence on supplemental oxygen: Secondary | ICD-10-CM | POA: Diagnosis not present

## 2016-03-24 DIAGNOSIS — E1159 Type 2 diabetes mellitus with other circulatory complications: Secondary | ICD-10-CM | POA: Diagnosis not present

## 2016-03-24 DIAGNOSIS — Z7902 Long term (current) use of antithrombotics/antiplatelets: Secondary | ICD-10-CM | POA: Diagnosis not present

## 2016-03-26 DIAGNOSIS — I890 Lymphedema, not elsewhere classified: Secondary | ICD-10-CM | POA: Diagnosis not present

## 2016-03-26 DIAGNOSIS — Z4800 Encounter for change or removal of nonsurgical wound dressing: Secondary | ICD-10-CM | POA: Diagnosis not present

## 2016-03-26 DIAGNOSIS — I87332 Chronic venous hypertension (idiopathic) with ulcer and inflammation of left lower extremity: Secondary | ICD-10-CM | POA: Diagnosis not present

## 2016-03-26 DIAGNOSIS — Z7984 Long term (current) use of oral hypoglycemic drugs: Secondary | ICD-10-CM | POA: Diagnosis not present

## 2016-03-26 DIAGNOSIS — Z7902 Long term (current) use of antithrombotics/antiplatelets: Secondary | ICD-10-CM | POA: Diagnosis not present

## 2016-03-26 DIAGNOSIS — Z7982 Long term (current) use of aspirin: Secondary | ICD-10-CM | POA: Diagnosis not present

## 2016-03-26 DIAGNOSIS — E11622 Type 2 diabetes mellitus with other skin ulcer: Secondary | ICD-10-CM | POA: Diagnosis not present

## 2016-03-26 DIAGNOSIS — L97222 Non-pressure chronic ulcer of left calf with fat layer exposed: Secondary | ICD-10-CM | POA: Diagnosis not present

## 2016-03-26 DIAGNOSIS — E1159 Type 2 diabetes mellitus with other circulatory complications: Secondary | ICD-10-CM | POA: Diagnosis not present

## 2016-03-26 DIAGNOSIS — Z9981 Dependence on supplemental oxygen: Secondary | ICD-10-CM | POA: Diagnosis not present

## 2016-03-26 DIAGNOSIS — I5022 Chronic systolic (congestive) heart failure: Secondary | ICD-10-CM | POA: Diagnosis not present

## 2016-03-29 DIAGNOSIS — Z4800 Encounter for change or removal of nonsurgical wound dressing: Secondary | ICD-10-CM | POA: Diagnosis not present

## 2016-03-29 DIAGNOSIS — Z9981 Dependence on supplemental oxygen: Secondary | ICD-10-CM | POA: Diagnosis not present

## 2016-03-29 DIAGNOSIS — Z7984 Long term (current) use of oral hypoglycemic drugs: Secondary | ICD-10-CM | POA: Diagnosis not present

## 2016-03-29 DIAGNOSIS — I890 Lymphedema, not elsewhere classified: Secondary | ICD-10-CM | POA: Diagnosis not present

## 2016-03-29 DIAGNOSIS — L97222 Non-pressure chronic ulcer of left calf with fat layer exposed: Secondary | ICD-10-CM | POA: Diagnosis not present

## 2016-03-29 DIAGNOSIS — Z7982 Long term (current) use of aspirin: Secondary | ICD-10-CM | POA: Diagnosis not present

## 2016-03-29 DIAGNOSIS — E11622 Type 2 diabetes mellitus with other skin ulcer: Secondary | ICD-10-CM | POA: Diagnosis not present

## 2016-03-29 DIAGNOSIS — I87332 Chronic venous hypertension (idiopathic) with ulcer and inflammation of left lower extremity: Secondary | ICD-10-CM | POA: Diagnosis not present

## 2016-03-29 DIAGNOSIS — Z7902 Long term (current) use of antithrombotics/antiplatelets: Secondary | ICD-10-CM | POA: Diagnosis not present

## 2016-03-29 DIAGNOSIS — I5022 Chronic systolic (congestive) heart failure: Secondary | ICD-10-CM | POA: Diagnosis not present

## 2016-03-29 DIAGNOSIS — E1159 Type 2 diabetes mellitus with other circulatory complications: Secondary | ICD-10-CM | POA: Diagnosis not present

## 2016-03-31 DIAGNOSIS — Z7982 Long term (current) use of aspirin: Secondary | ICD-10-CM | POA: Diagnosis not present

## 2016-03-31 DIAGNOSIS — L97222 Non-pressure chronic ulcer of left calf with fat layer exposed: Secondary | ICD-10-CM | POA: Diagnosis not present

## 2016-03-31 DIAGNOSIS — Z4800 Encounter for change or removal of nonsurgical wound dressing: Secondary | ICD-10-CM | POA: Diagnosis not present

## 2016-03-31 DIAGNOSIS — Z9981 Dependence on supplemental oxygen: Secondary | ICD-10-CM | POA: Diagnosis not present

## 2016-03-31 DIAGNOSIS — I890 Lymphedema, not elsewhere classified: Secondary | ICD-10-CM | POA: Diagnosis not present

## 2016-03-31 DIAGNOSIS — E11622 Type 2 diabetes mellitus with other skin ulcer: Secondary | ICD-10-CM | POA: Diagnosis not present

## 2016-03-31 DIAGNOSIS — Z7902 Long term (current) use of antithrombotics/antiplatelets: Secondary | ICD-10-CM | POA: Diagnosis not present

## 2016-03-31 DIAGNOSIS — Z7984 Long term (current) use of oral hypoglycemic drugs: Secondary | ICD-10-CM | POA: Diagnosis not present

## 2016-03-31 DIAGNOSIS — I5022 Chronic systolic (congestive) heart failure: Secondary | ICD-10-CM | POA: Diagnosis not present

## 2016-03-31 DIAGNOSIS — E1159 Type 2 diabetes mellitus with other circulatory complications: Secondary | ICD-10-CM | POA: Diagnosis not present

## 2016-03-31 DIAGNOSIS — I87332 Chronic venous hypertension (idiopathic) with ulcer and inflammation of left lower extremity: Secondary | ICD-10-CM | POA: Diagnosis not present

## 2016-04-02 DIAGNOSIS — I5022 Chronic systolic (congestive) heart failure: Secondary | ICD-10-CM | POA: Diagnosis not present

## 2016-04-02 DIAGNOSIS — L97222 Non-pressure chronic ulcer of left calf with fat layer exposed: Secondary | ICD-10-CM | POA: Diagnosis not present

## 2016-04-02 DIAGNOSIS — Z4800 Encounter for change or removal of nonsurgical wound dressing: Secondary | ICD-10-CM | POA: Diagnosis not present

## 2016-04-02 DIAGNOSIS — E1159 Type 2 diabetes mellitus with other circulatory complications: Secondary | ICD-10-CM | POA: Diagnosis not present

## 2016-04-02 DIAGNOSIS — Z9981 Dependence on supplemental oxygen: Secondary | ICD-10-CM | POA: Diagnosis not present

## 2016-04-02 DIAGNOSIS — I890 Lymphedema, not elsewhere classified: Secondary | ICD-10-CM | POA: Diagnosis not present

## 2016-04-02 DIAGNOSIS — E11622 Type 2 diabetes mellitus with other skin ulcer: Secondary | ICD-10-CM | POA: Diagnosis not present

## 2016-04-02 DIAGNOSIS — Z7902 Long term (current) use of antithrombotics/antiplatelets: Secondary | ICD-10-CM | POA: Diagnosis not present

## 2016-04-02 DIAGNOSIS — Z7984 Long term (current) use of oral hypoglycemic drugs: Secondary | ICD-10-CM | POA: Diagnosis not present

## 2016-04-02 DIAGNOSIS — Z7982 Long term (current) use of aspirin: Secondary | ICD-10-CM | POA: Diagnosis not present

## 2016-04-02 DIAGNOSIS — I87332 Chronic venous hypertension (idiopathic) with ulcer and inflammation of left lower extremity: Secondary | ICD-10-CM | POA: Diagnosis not present

## 2016-04-05 DIAGNOSIS — I890 Lymphedema, not elsewhere classified: Secondary | ICD-10-CM | POA: Diagnosis not present

## 2016-04-05 DIAGNOSIS — Z9981 Dependence on supplemental oxygen: Secondary | ICD-10-CM | POA: Diagnosis not present

## 2016-04-05 DIAGNOSIS — L97222 Non-pressure chronic ulcer of left calf with fat layer exposed: Secondary | ICD-10-CM | POA: Diagnosis not present

## 2016-04-05 DIAGNOSIS — I87332 Chronic venous hypertension (idiopathic) with ulcer and inflammation of left lower extremity: Secondary | ICD-10-CM | POA: Diagnosis not present

## 2016-04-05 DIAGNOSIS — Z7902 Long term (current) use of antithrombotics/antiplatelets: Secondary | ICD-10-CM | POA: Diagnosis not present

## 2016-04-05 DIAGNOSIS — E11622 Type 2 diabetes mellitus with other skin ulcer: Secondary | ICD-10-CM | POA: Diagnosis not present

## 2016-04-05 DIAGNOSIS — Z7982 Long term (current) use of aspirin: Secondary | ICD-10-CM | POA: Diagnosis not present

## 2016-04-05 DIAGNOSIS — Z4800 Encounter for change or removal of nonsurgical wound dressing: Secondary | ICD-10-CM | POA: Diagnosis not present

## 2016-04-05 DIAGNOSIS — E1159 Type 2 diabetes mellitus with other circulatory complications: Secondary | ICD-10-CM | POA: Diagnosis not present

## 2016-04-05 DIAGNOSIS — I5022 Chronic systolic (congestive) heart failure: Secondary | ICD-10-CM | POA: Diagnosis not present

## 2016-04-05 DIAGNOSIS — Z7984 Long term (current) use of oral hypoglycemic drugs: Secondary | ICD-10-CM | POA: Diagnosis not present

## 2016-04-07 DIAGNOSIS — Z7984 Long term (current) use of oral hypoglycemic drugs: Secondary | ICD-10-CM | POA: Diagnosis not present

## 2016-04-07 DIAGNOSIS — I5022 Chronic systolic (congestive) heart failure: Secondary | ICD-10-CM | POA: Diagnosis not present

## 2016-04-07 DIAGNOSIS — E1159 Type 2 diabetes mellitus with other circulatory complications: Secondary | ICD-10-CM | POA: Diagnosis not present

## 2016-04-07 DIAGNOSIS — I87332 Chronic venous hypertension (idiopathic) with ulcer and inflammation of left lower extremity: Secondary | ICD-10-CM | POA: Diagnosis not present

## 2016-04-07 DIAGNOSIS — Z7902 Long term (current) use of antithrombotics/antiplatelets: Secondary | ICD-10-CM | POA: Diagnosis not present

## 2016-04-07 DIAGNOSIS — I890 Lymphedema, not elsewhere classified: Secondary | ICD-10-CM | POA: Diagnosis not present

## 2016-04-07 DIAGNOSIS — Z4800 Encounter for change or removal of nonsurgical wound dressing: Secondary | ICD-10-CM | POA: Diagnosis not present

## 2016-04-07 DIAGNOSIS — E11622 Type 2 diabetes mellitus with other skin ulcer: Secondary | ICD-10-CM | POA: Diagnosis not present

## 2016-04-07 DIAGNOSIS — Z9981 Dependence on supplemental oxygen: Secondary | ICD-10-CM | POA: Diagnosis not present

## 2016-04-07 DIAGNOSIS — Z7982 Long term (current) use of aspirin: Secondary | ICD-10-CM | POA: Diagnosis not present

## 2016-04-07 DIAGNOSIS — L97222 Non-pressure chronic ulcer of left calf with fat layer exposed: Secondary | ICD-10-CM | POA: Diagnosis not present

## 2016-04-09 DIAGNOSIS — Z7982 Long term (current) use of aspirin: Secondary | ICD-10-CM | POA: Diagnosis not present

## 2016-04-09 DIAGNOSIS — L97222 Non-pressure chronic ulcer of left calf with fat layer exposed: Secondary | ICD-10-CM | POA: Diagnosis not present

## 2016-04-09 DIAGNOSIS — Z9981 Dependence on supplemental oxygen: Secondary | ICD-10-CM | POA: Diagnosis not present

## 2016-04-09 DIAGNOSIS — I890 Lymphedema, not elsewhere classified: Secondary | ICD-10-CM | POA: Diagnosis not present

## 2016-04-09 DIAGNOSIS — E1159 Type 2 diabetes mellitus with other circulatory complications: Secondary | ICD-10-CM | POA: Diagnosis not present

## 2016-04-09 DIAGNOSIS — E11622 Type 2 diabetes mellitus with other skin ulcer: Secondary | ICD-10-CM | POA: Diagnosis not present

## 2016-04-09 DIAGNOSIS — Z4800 Encounter for change or removal of nonsurgical wound dressing: Secondary | ICD-10-CM | POA: Diagnosis not present

## 2016-04-09 DIAGNOSIS — Z7902 Long term (current) use of antithrombotics/antiplatelets: Secondary | ICD-10-CM | POA: Diagnosis not present

## 2016-04-09 DIAGNOSIS — Z7984 Long term (current) use of oral hypoglycemic drugs: Secondary | ICD-10-CM | POA: Diagnosis not present

## 2016-04-09 DIAGNOSIS — I5022 Chronic systolic (congestive) heart failure: Secondary | ICD-10-CM | POA: Diagnosis not present

## 2016-04-09 DIAGNOSIS — I87332 Chronic venous hypertension (idiopathic) with ulcer and inflammation of left lower extremity: Secondary | ICD-10-CM | POA: Diagnosis not present

## 2016-04-11 DIAGNOSIS — I509 Heart failure, unspecified: Secondary | ICD-10-CM | POA: Diagnosis not present

## 2016-04-12 DIAGNOSIS — E1159 Type 2 diabetes mellitus with other circulatory complications: Secondary | ICD-10-CM | POA: Diagnosis not present

## 2016-04-12 DIAGNOSIS — Z7902 Long term (current) use of antithrombotics/antiplatelets: Secondary | ICD-10-CM | POA: Diagnosis not present

## 2016-04-12 DIAGNOSIS — E11622 Type 2 diabetes mellitus with other skin ulcer: Secondary | ICD-10-CM | POA: Diagnosis not present

## 2016-04-12 DIAGNOSIS — I87332 Chronic venous hypertension (idiopathic) with ulcer and inflammation of left lower extremity: Secondary | ICD-10-CM | POA: Diagnosis not present

## 2016-04-12 DIAGNOSIS — I5022 Chronic systolic (congestive) heart failure: Secondary | ICD-10-CM | POA: Diagnosis not present

## 2016-04-12 DIAGNOSIS — I890 Lymphedema, not elsewhere classified: Secondary | ICD-10-CM | POA: Diagnosis not present

## 2016-04-12 DIAGNOSIS — L97222 Non-pressure chronic ulcer of left calf with fat layer exposed: Secondary | ICD-10-CM | POA: Diagnosis not present

## 2016-04-12 DIAGNOSIS — Z7982 Long term (current) use of aspirin: Secondary | ICD-10-CM | POA: Diagnosis not present

## 2016-04-12 DIAGNOSIS — Z9981 Dependence on supplemental oxygen: Secondary | ICD-10-CM | POA: Diagnosis not present

## 2016-04-12 DIAGNOSIS — Z4800 Encounter for change or removal of nonsurgical wound dressing: Secondary | ICD-10-CM | POA: Diagnosis not present

## 2016-04-12 DIAGNOSIS — Z7984 Long term (current) use of oral hypoglycemic drugs: Secondary | ICD-10-CM | POA: Diagnosis not present

## 2016-04-14 DIAGNOSIS — I5022 Chronic systolic (congestive) heart failure: Secondary | ICD-10-CM | POA: Diagnosis not present

## 2016-04-14 DIAGNOSIS — Z7982 Long term (current) use of aspirin: Secondary | ICD-10-CM | POA: Diagnosis not present

## 2016-04-14 DIAGNOSIS — Z4800 Encounter for change or removal of nonsurgical wound dressing: Secondary | ICD-10-CM | POA: Diagnosis not present

## 2016-04-14 DIAGNOSIS — I890 Lymphedema, not elsewhere classified: Secondary | ICD-10-CM | POA: Diagnosis not present

## 2016-04-14 DIAGNOSIS — L97222 Non-pressure chronic ulcer of left calf with fat layer exposed: Secondary | ICD-10-CM | POA: Diagnosis not present

## 2016-04-14 DIAGNOSIS — I87332 Chronic venous hypertension (idiopathic) with ulcer and inflammation of left lower extremity: Secondary | ICD-10-CM | POA: Diagnosis not present

## 2016-04-14 DIAGNOSIS — E1159 Type 2 diabetes mellitus with other circulatory complications: Secondary | ICD-10-CM | POA: Diagnosis not present

## 2016-04-14 DIAGNOSIS — Z7984 Long term (current) use of oral hypoglycemic drugs: Secondary | ICD-10-CM | POA: Diagnosis not present

## 2016-04-14 DIAGNOSIS — Z9981 Dependence on supplemental oxygen: Secondary | ICD-10-CM | POA: Diagnosis not present

## 2016-04-14 DIAGNOSIS — E11622 Type 2 diabetes mellitus with other skin ulcer: Secondary | ICD-10-CM | POA: Diagnosis not present

## 2016-04-14 DIAGNOSIS — Z7902 Long term (current) use of antithrombotics/antiplatelets: Secondary | ICD-10-CM | POA: Diagnosis not present

## 2016-04-16 DIAGNOSIS — E1159 Type 2 diabetes mellitus with other circulatory complications: Secondary | ICD-10-CM | POA: Diagnosis not present

## 2016-04-16 DIAGNOSIS — Z7982 Long term (current) use of aspirin: Secondary | ICD-10-CM | POA: Diagnosis not present

## 2016-04-16 DIAGNOSIS — E11622 Type 2 diabetes mellitus with other skin ulcer: Secondary | ICD-10-CM | POA: Diagnosis not present

## 2016-04-16 DIAGNOSIS — Z4800 Encounter for change or removal of nonsurgical wound dressing: Secondary | ICD-10-CM | POA: Diagnosis not present

## 2016-04-16 DIAGNOSIS — I87332 Chronic venous hypertension (idiopathic) with ulcer and inflammation of left lower extremity: Secondary | ICD-10-CM | POA: Diagnosis not present

## 2016-04-16 DIAGNOSIS — I890 Lymphedema, not elsewhere classified: Secondary | ICD-10-CM | POA: Diagnosis not present

## 2016-04-16 DIAGNOSIS — Z7984 Long term (current) use of oral hypoglycemic drugs: Secondary | ICD-10-CM | POA: Diagnosis not present

## 2016-04-16 DIAGNOSIS — Z9981 Dependence on supplemental oxygen: Secondary | ICD-10-CM | POA: Diagnosis not present

## 2016-04-16 DIAGNOSIS — I5022 Chronic systolic (congestive) heart failure: Secondary | ICD-10-CM | POA: Diagnosis not present

## 2016-04-16 DIAGNOSIS — Z7902 Long term (current) use of antithrombotics/antiplatelets: Secondary | ICD-10-CM | POA: Diagnosis not present

## 2016-04-16 DIAGNOSIS — L97222 Non-pressure chronic ulcer of left calf with fat layer exposed: Secondary | ICD-10-CM | POA: Diagnosis not present

## 2016-04-17 ENCOUNTER — Emergency Department: Payer: Medicare Other

## 2016-04-17 ENCOUNTER — Inpatient Hospital Stay
Admission: EM | Admit: 2016-04-17 | Discharge: 2016-04-20 | DRG: 603 | Disposition: A | Discharge status: Home / Self Care | Payer: Medicare Other | Attending: Internal Medicine | Admitting: Internal Medicine

## 2016-04-17 DIAGNOSIS — E7800 Pure hypercholesterolemia, unspecified: Secondary | ICD-10-CM | POA: Diagnosis not present

## 2016-04-17 DIAGNOSIS — Z7902 Long term (current) use of antithrombotics/antiplatelets: Secondary | ICD-10-CM

## 2016-04-17 DIAGNOSIS — Z79899 Other long term (current) drug therapy: Secondary | ICD-10-CM | POA: Diagnosis not present

## 2016-04-17 DIAGNOSIS — I509 Heart failure, unspecified: Secondary | ICD-10-CM | POA: Diagnosis present

## 2016-04-17 DIAGNOSIS — I130 Hypertensive heart and chronic kidney disease with heart failure and stage 1 through stage 4 chronic kidney disease, or unspecified chronic kidney disease: Secondary | ICD-10-CM | POA: Diagnosis not present

## 2016-04-17 DIAGNOSIS — Z8249 Family history of ischemic heart disease and other diseases of the circulatory system: Secondary | ICD-10-CM

## 2016-04-17 DIAGNOSIS — I872 Venous insufficiency (chronic) (peripheral): Secondary | ICD-10-CM | POA: Diagnosis not present

## 2016-04-17 DIAGNOSIS — Z8709 Personal history of other diseases of the respiratory system: Secondary | ICD-10-CM

## 2016-04-17 DIAGNOSIS — I10 Essential (primary) hypertension: Secondary | ICD-10-CM | POA: Diagnosis not present

## 2016-04-17 DIAGNOSIS — Z7984 Long term (current) use of oral hypoglycemic drugs: Secondary | ICD-10-CM | POA: Diagnosis not present

## 2016-04-17 DIAGNOSIS — E1122 Type 2 diabetes mellitus with diabetic chronic kidney disease: Secondary | ICD-10-CM | POA: Diagnosis not present

## 2016-04-17 DIAGNOSIS — R079 Chest pain, unspecified: Secondary | ICD-10-CM | POA: Diagnosis not present

## 2016-04-17 DIAGNOSIS — N189 Chronic kidney disease, unspecified: Secondary | ICD-10-CM | POA: Diagnosis present

## 2016-04-17 DIAGNOSIS — G4733 Obstructive sleep apnea (adult) (pediatric): Secondary | ICD-10-CM | POA: Diagnosis present

## 2016-04-17 DIAGNOSIS — Z6841 Body Mass Index (BMI) 40.0 and over, adult: Secondary | ICD-10-CM

## 2016-04-17 DIAGNOSIS — J9611 Chronic respiratory failure with hypoxia: Secondary | ICD-10-CM | POA: Diagnosis present

## 2016-04-17 DIAGNOSIS — I2510 Atherosclerotic heart disease of native coronary artery without angina pectoris: Secondary | ICD-10-CM | POA: Diagnosis present

## 2016-04-17 DIAGNOSIS — J449 Chronic obstructive pulmonary disease, unspecified: Secondary | ICD-10-CM | POA: Diagnosis present

## 2016-04-17 DIAGNOSIS — M419 Scoliosis, unspecified: Secondary | ICD-10-CM | POA: Diagnosis not present

## 2016-04-17 DIAGNOSIS — M7989 Other specified soft tissue disorders: Secondary | ICD-10-CM | POA: Diagnosis not present

## 2016-04-17 DIAGNOSIS — R809 Proteinuria, unspecified: Secondary | ICD-10-CM | POA: Diagnosis not present

## 2016-04-17 DIAGNOSIS — Z7982 Long term (current) use of aspirin: Secondary | ICD-10-CM | POA: Diagnosis not present

## 2016-04-17 DIAGNOSIS — L03115 Cellulitis of right lower limb: Secondary | ICD-10-CM

## 2016-04-17 DIAGNOSIS — N179 Acute kidney failure, unspecified: Secondary | ICD-10-CM | POA: Diagnosis not present

## 2016-04-17 DIAGNOSIS — J9610 Chronic respiratory failure, unspecified whether with hypoxia or hypercapnia: Secondary | ICD-10-CM | POA: Diagnosis not present

## 2016-04-17 DIAGNOSIS — Z9981 Dependence on supplemental oxygen: Secondary | ICD-10-CM

## 2016-04-17 DIAGNOSIS — I252 Old myocardial infarction: Secondary | ICD-10-CM | POA: Diagnosis not present

## 2016-04-17 DIAGNOSIS — R601 Generalized edema: Secondary | ICD-10-CM | POA: Diagnosis not present

## 2016-04-17 DIAGNOSIS — L03116 Cellulitis of left lower limb: Secondary | ICD-10-CM

## 2016-04-17 DIAGNOSIS — R0602 Shortness of breath: Secondary | ICD-10-CM

## 2016-04-17 DIAGNOSIS — R600 Localized edema: Secondary | ICD-10-CM | POA: Diagnosis not present

## 2016-04-17 LAB — CBC WITH DIFFERENTIAL/PLATELET
BASOS ABS: 0 10*3/uL (ref 0–0.1)
Basophils Relative: 0 %
EOS PCT: 4 %
Eosinophils Absolute: 0.4 10*3/uL (ref 0–0.7)
HCT: 33.7 % — ABNORMAL LOW (ref 40.0–52.0)
Hemoglobin: 10.7 g/dL — ABNORMAL LOW (ref 13.0–18.0)
LYMPHS ABS: 0.8 10*3/uL — AB (ref 1.0–3.6)
LYMPHS PCT: 9 %
MCH: 28 pg (ref 26.0–34.0)
MCHC: 31.7 g/dL — ABNORMAL LOW (ref 32.0–36.0)
MCV: 88.4 fL (ref 80.0–100.0)
MONO ABS: 0.7 10*3/uL (ref 0.2–1.0)
Monocytes Relative: 8 %
Neutro Abs: 6.8 10*3/uL — ABNORMAL HIGH (ref 1.4–6.5)
Neutrophils Relative %: 79 %
PLATELETS: 182 10*3/uL (ref 150–440)
RBC: 3.82 MIL/uL — ABNORMAL LOW (ref 4.40–5.90)
RDW: 14.8 % — AB (ref 11.5–14.5)
WBC: 8.7 10*3/uL (ref 3.8–10.6)

## 2016-04-17 LAB — COMPREHENSIVE METABOLIC PANEL
ALT: 14 U/L — ABNORMAL LOW (ref 17–63)
AST: 16 U/L (ref 15–41)
Albumin: 3.2 g/dL — ABNORMAL LOW (ref 3.5–5.0)
Alkaline Phosphatase: 83 U/L (ref 38–126)
Anion gap: 8 (ref 5–15)
BUN: 46 mg/dL — ABNORMAL HIGH (ref 6–20)
CHLORIDE: 93 mmol/L — AB (ref 101–111)
CO2: 34 mmol/L — ABNORMAL HIGH (ref 22–32)
Calcium: 9.7 mg/dL (ref 8.9–10.3)
Creatinine, Ser: 2.03 mg/dL — ABNORMAL HIGH (ref 0.61–1.24)
GFR, EST AFRICAN AMERICAN: 38 mL/min — AB (ref >60)
GFR, EST NON AFRICAN AMERICAN: 33 mL/min — AB (ref >60)
Glucose, Bld: 94 mg/dL (ref 65–99)
POTASSIUM: 4.1 mmol/L (ref 3.5–5.1)
Sodium: 135 mmol/L (ref 135–145)
Total Bilirubin: 0.6 mg/dL (ref 0.3–1.2)
Total Protein: 9.4 g/dL — ABNORMAL HIGH (ref 6.5–8.1)

## 2016-04-17 LAB — GLUCOSE, CAPILLARY
Glucose-Capillary: 107 mg/dL — ABNORMAL HIGH (ref 65–99)
Glucose-Capillary: 107 mg/dL — ABNORMAL HIGH (ref 65–99)

## 2016-04-17 LAB — BRAIN NATRIURETIC PEPTIDE: B NATRIURETIC PEPTIDE 5: 62 pg/mL (ref 0.0–100.0)

## 2016-04-17 MED ORDER — IRBESARTAN 75 MG PO TABS
300.0000 mg | ORAL_TABLET | Freq: Every day | ORAL | Status: DC
  Administered 2016-04-17 – 2016-04-20 (×4): 300 mg via ORAL
  Filled 2016-04-17 (×4): qty 4

## 2016-04-17 MED ORDER — ASPIRIN EC 81 MG PO TBEC
81.0000 mg | DELAYED_RELEASE_TABLET | Freq: Every day | ORAL | Status: DC
  Administered 2016-04-17 – 2016-04-20 (×4): 81 mg via ORAL
  Filled 2016-04-17 (×4): qty 1

## 2016-04-17 MED ORDER — FUROSEMIDE 10 MG/ML IJ SOLN
40.0000 mg | Freq: Two times a day (BID) | INTRAMUSCULAR | Status: DC
  Administered 2016-04-17 – 2016-04-20 (×6): 40 mg via INTRAVENOUS
  Filled 2016-04-17 (×6): qty 4

## 2016-04-17 MED ORDER — BISACODYL 10 MG RE SUPP: 10.0000 mg | Freq: Every day | RECTAL | Status: DC | PRN

## 2016-04-17 MED ORDER — VANCOMYCIN HCL 10 G IV SOLR
1250.0000 mg | Freq: Once | INTRAVENOUS | Status: AC
  Administered 2016-04-17: 16:00:00 1250 mg via INTRAVENOUS
  Filled 2016-04-17: qty 1250

## 2016-04-17 MED ORDER — PANTOPRAZOLE SODIUM 40 MG PO TBEC
40.0000 mg | DELAYED_RELEASE_TABLET | Freq: Every day | ORAL | Status: DC
  Administered 2016-04-17 – 2016-04-20 (×4): 40 mg via ORAL
  Filled 2016-04-17 (×4): qty 1

## 2016-04-17 MED ORDER — VANCOMYCIN HCL 10 G IV SOLR
1250.0000 mg | INTRAVENOUS | Status: DC
  Administered 2016-04-17 – 2016-04-18 (×2): 1250 mg via INTRAVENOUS
  Filled 2016-04-17 (×4): qty 1250

## 2016-04-17 MED ORDER — CLINDAMYCIN PHOSPHATE 600 MG/50ML IV SOLN
600.0000 mg | Freq: Once | INTRAVENOUS | Status: AC
  Administered 2016-04-17: 600 mg via INTRAVENOUS
  Filled 2016-04-17: qty 50

## 2016-04-17 MED ORDER — PIPERACILLIN-TAZOBACTAM 4.5 G IVPB
4.5000 g | Freq: Once | INTRAVENOUS | Status: AC
  Administered 2016-04-17: 4.5 g via INTRAVENOUS
  Filled 2016-04-17: qty 100

## 2016-04-17 MED ORDER — CLOPIDOGREL BISULFATE 75 MG PO TABS
75.0000 mg | ORAL_TABLET | Freq: Every day | ORAL | Status: DC
  Administered 2016-04-17 – 2016-04-20 (×4): 75 mg via ORAL
  Filled 2016-04-17 (×4): qty 1

## 2016-04-17 MED ORDER — TAMSULOSIN HCL 0.4 MG PO CAPS
0.4000 mg | ORAL_CAPSULE | Freq: Every day | ORAL | Status: DC
  Administered 2016-04-17 – 2016-04-20 (×4): 0.4 mg via ORAL
  Filled 2016-04-17 (×4): qty 1

## 2016-04-17 MED ORDER — OXYCODONE HCL 5 MG PO TABS
5.0000 mg | ORAL_TABLET | ORAL | Status: DC | PRN
  Administered 2016-04-17 – 2016-04-19 (×9): 5 mg via ORAL
  Filled 2016-04-17 (×9): qty 1

## 2016-04-17 MED ORDER — ROSUVASTATIN CALCIUM 20 MG PO TABS
10.0000 mg | ORAL_TABLET | Freq: Every day | ORAL | Status: DC
  Administered 2016-04-17 – 2016-04-20 (×4): 10 mg via ORAL
  Filled 2016-04-17 (×4): qty 1

## 2016-04-17 MED ORDER — SODIUM CHLORIDE 0.9 % IV SOLN
INTRAVENOUS | Status: DC
  Administered 2016-04-17 – 2016-04-18 (×2): via INTRAVENOUS

## 2016-04-17 MED ORDER — HEPARIN SODIUM (PORCINE) 5000 UNIT/ML IJ SOLN
5000.0000 [IU] | Freq: Three times a day (TID) | INTRAMUSCULAR | Status: DC
  Administered 2016-04-17 – 2016-04-20 (×9): 5000 [IU] via SUBCUTANEOUS
  Filled 2016-04-17 (×9): qty 1

## 2016-04-17 MED ORDER — INSULIN ASPART 100 UNIT/ML ~~LOC~~ SOLN
0.0000 [IU] | Freq: Three times a day (TID) | SUBCUTANEOUS | Status: DC
  Administered 2016-04-18: 1 [IU] via SUBCUTANEOUS
  Filled 2016-04-17 (×2): qty 1

## 2016-04-17 MED ORDER — OXYCODONE HCL 5 MG PO TABS
10.0000 mg | ORAL_TABLET | Freq: Once | ORAL | Status: AC
  Administered 2016-04-17: 10 mg via ORAL
  Filled 2016-04-17: qty 2

## 2016-04-17 MED ORDER — ONDANSETRON HCL 4 MG PO TABS: 4.0000 mg | ORAL_TABLET | Freq: Four times a day (QID) | ORAL | Status: DC | PRN

## 2016-04-17 MED ORDER — ONDANSETRON HCL 4 MG/2ML IJ SOLN: 4.0000 mg | Freq: Four times a day (QID) | INTRAMUSCULAR | Status: DC | PRN

## 2016-04-17 MED ORDER — SODIUM CHLORIDE 0.9% FLUSH
3.0000 mL | Freq: Two times a day (BID) | INTRAVENOUS | Status: DC
  Administered 2016-04-18 – 2016-04-20 (×5): 3 mL via INTRAVENOUS

## 2016-04-17 MED ORDER — NEBIVOLOL HCL 5 MG PO TABS
20.0000 mg | ORAL_TABLET | Freq: Every day | ORAL | Status: DC
  Administered 2016-04-17 – 2016-04-19 (×3): 20 mg via ORAL
  Filled 2016-04-17 (×4): qty 4

## 2016-04-17 MED ORDER — DOCUSATE SODIUM 100 MG PO CAPS
100.0000 mg | ORAL_CAPSULE | Freq: Two times a day (BID) | ORAL | Status: DC
  Administered 2016-04-17 – 2016-04-20 (×6): 100 mg via ORAL
  Filled 2016-04-17 (×6): qty 1

## 2016-04-17 MED ORDER — NEBIVOLOL HCL 5 MG PO TABS
10.0000 mg | ORAL_TABLET | ORAL | Status: DC
  Administered 2016-04-18 – 2016-04-20 (×3): 10 mg via ORAL
  Filled 2016-04-17 (×2): qty 2

## 2016-04-17 MED ORDER — ACETAMINOPHEN 650 MG RE SUPP: 650.0000 mg | Freq: Four times a day (QID) | RECTAL | Status: DC | PRN

## 2016-04-17 MED ORDER — FUROSEMIDE 10 MG/ML IJ SOLN
40.0000 mg | Freq: Once | INTRAMUSCULAR | Status: AC
  Administered 2016-04-17: 40 mg via INTRAVENOUS
  Filled 2016-04-17: qty 4

## 2016-04-17 MED ORDER — LIRAGLUTIDE 18 MG/3ML ~~LOC~~ SOPN
1.8000 mg | PEN_INJECTOR | Freq: Every day | SUBCUTANEOUS | Status: DC
  Filled 2016-04-17: qty 3

## 2016-04-17 MED ORDER — GLIPIZIDE ER 5 MG PO TB24
5.0000 mg | ORAL_TABLET | Freq: Two times a day (BID) | ORAL | Status: DC
  Administered 2016-04-17 – 2016-04-19 (×4): 5 mg via ORAL
  Filled 2016-04-17 (×4): qty 1

## 2016-04-17 MED ORDER — PIPERACILLIN-TAZOBACTAM 4.5 G IVPB
4.5000 g | Freq: Three times a day (TID) | INTRAVENOUS | Status: DC
  Administered 2016-04-17 – 2016-04-19 (×5): 4.5 g via INTRAVENOUS
  Filled 2016-04-17 (×7): qty 100

## 2016-04-17 MED ORDER — ACETAMINOPHEN 325 MG PO TABS: 650.0000 mg | ORAL_TABLET | Freq: Four times a day (QID) | ORAL | Status: DC | PRN

## 2016-04-17 NOTE — H&P (Signed)
History and Physical    Ernest HuaGordon R Youtsey WJX:[id number] DOB: 04-24-1951 DOA: 04/17/2016  Referring physician: Dr. Inocencio HomesGayle PCP: Sherrie MustacheFayegh Jadali, MD  Specialists: [name]  Chief Complaint: RLE swelling and pain  HPI: [name] is a 65 y.o. male has a past medical history significant for morbid obesity, chronic respiratory failure on O2, chronic CHF and OSA now with progressive RLE swelling and pain with redness. Has also noted weight gain with worsening SOB. No fever. Denies Cp or palpitations. In ER, pt stable on 2L O2 but renal function acutely worse and LE edema massive. Also noted was severe erythema with warmth and tenderness involving the entire right leg. He is now admitted  Review of Systems: The patient denies anorexia, fever, weight loss,, vision loss, decreased hearing, hoarseness, chest pain, syncope,  balance deficits, hemoptysis, abdominal pain, melena, hematochezia, severe indigestion/heartburn, hematuria, incontinence, genital sores, muscle weakness, suspicious skin lesions, transient blindness,  depression, unusual weight change, abnormal bleeding, enlarged lymph nodes, angioedema, and breast masses.   Past Medical History  Diagnosis Date  . High blood pressure   . Heart attack (HCC)   . Asthma   . High cholesterol   . Seasonal allergies   . Sleep apnea   . Scoliosis   . COPD (chronic obstructive pulmonary disease) [facility](HCC)    Past Surgical History  Procedure Laterality Date  . Back sx      L4 L5   . Carpal tunnel release Right    Social History:  reports that he has never smoked. He has never used smokeless tobacco. He reports that he does not drink alcohol or use illicit drugs.  Allergies  Allergen Reactions  . Metformin And Related Other (See Comments)    "shakes" per pt  . Morphine And Related Other (See Comments)    Hallucinations, sweats    Family History  Problem Relation Age of Onset  . Heart disease Father     Prior to Admission medications    Medication Sig Start Date End Date Taking? Authorizing Provider  aspirin EC 81 MG tablet Take 81 mg by mouth daily.   Yes Historical Provider, MD  celecoxib (CELEBREX) 200 MG capsule Take 200 mg by mouth daily.   Yes Historical Provider, MD  clopidogrel (PLAVIX) 75 MG tablet Take 75 mg by mouth daily.   Yes Historical Provider, MD  dexlansoprazole (DEXILANT) 60 MG capsule Take 60 mg by mouth daily.   Yes Historical Provider, MD  furosemide (LASIX) 40 MG tablet Take 40 mg by mouth 2 (two) times daily.    Yes Historical Provider, MD  glipiZIDE (GLUCOTROL XL) 5 MG 24 hr tablet Take 5 mg by mouth 2 (two) times daily.   Yes Historical Provider, MD  Liraglutide (VICTOZA) 18 MG/3ML SOPN Inject 1.8 mg into the skin daily.   Yes Historical Provider, MD  nebivolol (BYSTOLIC) 10 MG tablet Take 10 mg by mouth every morning. *Note dose*   Yes Historical Provider, MD  Nebivolol HCl (BYSTOLIC) 20 MG TABS Take 20 mg by mouth at bedtime. *note dose*   Yes Historical Provider, MD  OXYGEN Inhale 2 L into the lungs continuous.   Yes Historical Provider, MD  polyethylene glycol powder (GLYCOLAX/MIRALAX) powder Dissolve one heaping tablespoon in 4-8 ounces of juice or water and drink once daily. 12/21/15  Yes Gayla DossEryka A Gayle, MD  rosuvastatin (CRESTOR) 10 MG tablet Take 10 mg by mouth daily.   Yes Historical Provider, MD  tamsulosin (FLOMAX) 0.4 MG CAPS capsule Take  0.4 mg by mouth daily.   Yes Historical Provider, MD  telmisartan (MICARDIS) 80 MG tablet Take 80 mg by mouth daily.   Yes Historical Provider, MD  ondansetron (ZOFRAN ODT) 4 MG disintegrating tablet Take 1 tablet (4 mg total) by mouth every 8 (eight) hours as needed for nausea or vomiting. 12/21/15   Gayla Doss, MD  oxyCODONE (ROXICODONE) 5 MG immediate release tablet Take 1 tablet (5 mg total) by mouth every 6 (six) hours as needed for moderate pain. Do not drive while taking this medication. 12/21/15   Gayla Doss, MD   Physical Exam: Filed Vitals:    04/17/16 1117 04/17/16 1230 04/17/16 1300  BP: 127/67 137/82 118/65  Pulse: 69 75 77  Temp: 97.8 F (36.6 C)    TempSrc: Oral    Resp: 20    Height:  (1.727 m)    Weight: 145.151 kg (320 lb)    SpO2: 100% 100% 97%     General: Chronically ill appearing, obese, [place]/AT but in no apparent distress  Eyes: PERRL, EOMI, no scleral icterus, conjunctiva clear  ENT: moist oropharynx without lesions, TM's benign, dentition fair  Neck: supple, no lymphadenopathy. No bruits or thyromegaly  Cardiovascular: regular rate without MRG; 2+ peripheral pulses, no JVD, 3+ peripheral edema  Respiratory: Bilateral rales 1/3 way up without wheezes or rhonchi. No dullness. Respiratory effort normal  Abdomen: soft, non tender to palpation, positive bowel sounds, no guarding, no rebound  Skin: diffuse erythema of RLE from groin to toes with warmth/tenderness. No drainage  Musculoskeletal: normal bulk and tone, no joint swelling  Psychiatric: normal mood and affect, A&OX3  Neurologic: CN 2-12 grossly intact, Motor strength 5/5 in all 4 groups with symmetric DTR's and mildly decreased sensation in feet bilaterally  Labs on Admission:  Basic Metabolic Panel:  Recent Labs Lab 04/17/16 1205  NA 135  K 4.1  CL 93*  CO2 34*  GLUCOSE 94  BUN 46*  CREATININE 2.03*  CALCIUM 9.7   Liver Function Tests:  Recent Labs Lab 04/17/16 1205  AST 16  ALT 14*  ALKPHOS 83  BILITOT 0.6  PROT 9.4*  ALBUMIN 3.2*   No results for input(s): LIPASE, AMYLASE in the last 168 hours. No results for input(s): AMMONIA in the last 168 hours. CBC:  Recent Labs Lab 04/17/16 1205  WBC 8.7  NEUTROABS 6.8*  HGB 10.7*  HCT 33.7*  MCV 88.4  PLT 182   Cardiac Enzymes: No results for input(s): CKTOTAL, CKMB, CKMBINDEX, TROPONINI in the last 168 hours.  BNP (last 3 results)  Recent Labs  12/21/15 0303 04/17/16 1205  BNP 21.0 62.0    ProBNP (last 3 results) No results for input(s): PROBNP in the  last 8760 hours.  CBG: No results for input(s): GLUCAP in the last 168 hours.  Radiological Exams on Admission: US Venous Img Lower Bilateral  04/17/2016  CLINICAL DATA:  Bilateral swelling for 1 day EXAM: BILATERAL LOWER EXTREMITY VENOUS DOPPLER ULTRASOUND TECHNIQUE: Gray-scale sonography with graded compression, as well as color Doppler and duplex ultrasound were performed to evaluate the lower extremity deep venous systems from the level of the common femoral vein and including the common femoral, femoral, profunda femoral, popliteal and calf veins including the posterior tibial, peroneal and gastrocnemius veins when visible. The superficial great saphenous vein was also interrogated. Spectral Doppler was utilized to evaluate flow at rest and with distal augmentation maneuvers in the common femoral, femoral and popliteal veins. COMPARISON:  None. FINDINGS:  RIGHT LOWER EXTREMITY Common Femoral Vein: No evidence of thrombus. Normal compressibility, respiratory phasicity and response to augmentation. Saphenofemoral Junction: No evidence of thrombus. Normal compressibility and flow on color Doppler imaging. Profunda Femoral Vein: No evidence of thrombus. Normal compressibility and flow on color Doppler imaging. Femoral Vein: No evidence of thrombus. Normal compressibility, respiratory phasicity and response to augmentation. Popliteal Vein: No evidence of thrombus. Normal compressibility, respiratory phasicity and response to augmentation. Calf Veins: No evidence of thrombus. Normal compressibility and flow on color Doppler imaging. Superficial Great Saphenous Vein: No evidence of thrombus. Normal compressibility and flow on color Doppler imaging. Venous Reflux:  None. Other Findings:  None. LEFT LOWER EXTREMITY Common Femoral Vein: No evidence of thrombus. Normal compressibility, respiratory phasicity and response to augmentation. Saphenofemoral Junction: No evidence of thrombus. Normal compressibility and  flow on color Doppler imaging. Profunda Femoral Vein: No evidence of thrombus. Normal compressibility and flow on color Doppler imaging. Femoral Vein: No evidence of thrombus. Normal compressibility, respiratory phasicity and response to augmentation. Popliteal Vein: No evidence of thrombus. Normal compressibility, respiratory phasicity and response to augmentation. Calf Veins: No evidence of thrombus. Normal compressibility and flow on color Doppler imaging. Superficial Great Saphenous Vein: No evidence of thrombus. Normal compressibility and flow on color Doppler imaging. Venous Reflux:  None. Other Findings:  None. IMPRESSION: No definite evidence evidence of deep venous thrombosis bilateral lower extremity. Suboptimal study due to patient's large body habitus and lower extremity edema. Electronically Signed   By: Natasha Mead M.D.   On: 04/17/2016 13:00    EKG: Independently reviewed.  Assessment/Plan Principal Problem:   Cellulitis of left leg Active Problems:   OSA on CPAP   CHF (congestive heart failure) (HCC)   Chronic hypoxemic respiratory failure (HCC)   ARF (acute renal failure) (HCC)   Will admit to floor with telemetry and begin IV ABX. Monitor renal fxn closely and consult Nephrology. Order echo and Cardiology consult. Attempt to diurese with IV Lasix. Repeat labs in AM. Continue O2 per usual.  Diet: low sodium Fluids: NS@75  DVT Prophylaxis: SQ Heparin  Code Status: FULL  Family Communication: yes  Disposition Plan: home  Time spent: 55 min

## 2016-04-17 NOTE — ED Provider Notes (Signed)
[facility] Emergency Department Provider Note   ____________________________________________  Time seen: Approximately 11:33 AM  I have reviewed the triage vital signs and the nursing notes.   HISTORY  Chief Complaint Leg Swelling    HPI [name] is a 65 y.o. male  with COPD, CHF, chronic 2-3 L home oxygen requirement, coronary artery disease, obesity, diabetes who presents for evaluation of spreading right leg redness as well as right leg pain since yesterday, gradual onset, constant, severe, no modifying factors. Patient has chronic pitting edema bilateral lower extremities and is followed by home health and his legs are wrapped. Yesterday when his bandages were being changed, his home health nurse noted some redness and given that the redness continued to spread today, he was referred to the emergency department for evaluation. He has had chills but denies any measured fevers. No chest pain or trauma. He has chronic shortness of breath which is unchanged from baseline.   Past Medical History  Diagnosis Date  . High blood pressure   . Heart attack (HCC)   . Asthma   . High cholesterol   . Seasonal allergies   . Sleep apnea   . Scoliosis   . COPD (chronic obstructive pulmonary disease) [facility])     Patient Active Problem List   Diagnosis Date Noted  . Chronic hypoxemic respiratory failure (HCC) 12/12/2013  . Shortness of breath 11/21/2013  . OSA on CPAP 11/21/2013  . CHF (congestive heart failure) (HCC) 11/21/2013  . CAD (coronary artery disease) of artery bypass graft 11/21/2013    Past Surgical History  Procedure Laterality Date  . Back sx      L4 L5   . Carpal tunnel release Right     Current Outpatient Rx  Name  Route  Sig  Dispense  Refill  . aspirin 81 MG tablet   Oral   Take 81 mg by mouth daily.         . celecoxib (CELEBREX) 200 MG capsule   Oral   Take 200 mg by mouth daily.         . Cholecalciferol (VITAMIN D3)  50000 units CAPS   Oral   Take 1 capsule by mouth once a week.         . clopidogrel (PLAVIX) 75 MG tablet   Oral   Take 75 mg by mouth daily.         . furosemide (LASIX) 40 MG tablet   Oral   Take 40 mg by mouth daily.         Marland Kitchen glipiZIDE (GLUCOTROL) 5 MG tablet   Oral   Take 5 mg by mouth daily before breakfast.         . Liraglutide (VICTOZA) 18 MG/3ML SOPN   Subcutaneous   Inject 1.8 mg into the skin daily.         . nebivolol (BYSTOLIC) 10 MG tablet   Oral   Take 10 mg by mouth daily.         . Nebivolol HCl (BYSTOLIC) 20 MG TABS   Oral   Take 20 mg by mouth at bedtime.         Marland Kitchen omeprazole (PRILOSEC) 40 MG capsule   Oral   Take 40 mg by mouth daily.         . ondansetron (ZOFRAN ODT) 4 MG disintegrating tablet   Oral   Take 1 tablet (4 mg total) by mouth every 8 (eight) hours as needed for  nausea or vomiting.   8 tablet   0   . oxyCODONE (ROXICODONE) 5 MG immediate release tablet   Oral   Take 1 tablet (5 mg total) by mouth every 6 (six) hours as needed for moderate pain. Do not drive while taking this medication.   8 tablet   0   . polyethylene glycol powder (GLYCOLAX/MIRALAX) powder      Dissolve one heaping tablespoon in 4-8 ounces of juice or water and drink once daily.   255 g   0   . rosuvastatin (CRESTOR) 10 MG tablet   Oral   Take 10 mg by mouth daily.         . tamsulosin (FLOMAX) 0.4 MG CAPS capsule   Oral   Take 0.4 mg by mouth daily.         Marland Kitchen telmisartan (MICARDIS) 80 MG tablet   Oral   Take 80 mg by mouth daily.           Allergies Metformin and related and Morphine and related  Family History  Problem Relation Age of Onset  . Heart disease Father     Social History Social History  Substance Use Topics  . Smoking status: Never Smoker   . Smokeless tobacco: Never Used  . Alcohol Use: No    Review of Systems Constitutional: No fever/chills Eyes: No visual changes. ENT: No sore  throat. Cardiovascular: Denies chest pain. Respiratory: + chronic shortness of breath. Gastrointestinal: No abdominal pain.  No nausea, no vomiting.  No diarrhea.  No constipation. Genitourinary: Negative for dysuria. Musculoskeletal: Negative for back pain. Skin: Negative for rash. Neurological: Negative for headaches, focal weakness or numbness.  10-point ROS otherwise negative.  ____________________________________________   PHYSICAL EXAM:  VITAL SIGNS: ED Triage Vitals  Enc Vitals Group     BP 04/17/16 1117 127/67 mmHg     Pulse Rate 04/17/16 1117 69     Resp 04/17/16 1117 20     Temp 04/17/16 1117 97.8 F (36.6 C)     Temp Source 04/17/16 1117 Oral     SpO2 04/17/16 1117 100 %     Weight 04/17/16 1117 320 lb (145.151 kg)     Height 04/17/16 1117  (1.727 m)     Head Cir --      Peak Flow --      Pain Score 04/17/16 1118 10     Pain Loc --      Pain Edu? --      Excl. in GC? --     Constitutional: Alert and oriented. Nontoxic appearing and in no acute distress. Eyes: Conjunctivae are normal. PERRL. EOMI. Head: Atraumatic. Nose: No congestion/rhinnorhea. Mouth/Throat: Mucous membranes are moist.  Oropharynx non-erythematous. Neck: No stridor.  Supple without meningismus. Cardiovascular: Normal rate, regular rhythm. Grossly normal heart sounds.  Good peripheral circulation. Respiratory: Normal respiratory effort.  No retractions. Lungs CTAB. Gastrointestinal: Soft and nontender. No distention. No CVA tenderness. Genitourinary: deferred Musculoskeletal: Severe pitting edema bilateral lower extremities, worse in the right lower extremity. The right leg is warm to the touch, erythematous, indurated, there are 2 cm fluid-filled blisters in the lateral aspect of the right lower leg. There is a superficial abrasion to the right shin. 2+ DP pulses in bilateral lower extremities. Neurologic:  Normal speech and language. No gross focal neurologic deficits are appreciated.   Skin:  Skin is warm, dry and intact. No rash noted. Psychiatric: Mood and affect are normal. Speech and behavior are normal.  ____________________________________________   LABS (all labs ordered are listed, but only abnormal results are displayed)  Labs Reviewed  CBC WITH DIFFERENTIAL/PLATELET - Abnormal; Notable for the following:    RBC 3.82 (*)    Hemoglobin 10.7 (*)    HCT 33.7 (*)    MCHC 31.7 (*)    RDW 14.8 (*)    Neutro Abs 6.8 (*)    Lymphs Abs 0.8 (*)    All other components within normal limits  COMPREHENSIVE METABOLIC PANEL - Abnormal; Notable for the following:    Chloride 93 (*)    CO2 34 (*)    BUN 46 (*)    Creatinine, Ser 2.03 (*)    Total Protein 9.4 (*)    Albumin 3.2 (*)    ALT 14 (*)    GFR calc non Af Amer 33 (*)    GFR calc Af Amer 38 (*)    All other components within normal limits  CULTURE, BLOOD (ROUTINE X 2)  CULTURE, BLOOD (ROUTINE X 2)  BRAIN NATRIURETIC PEPTIDE   ____________________________________________  EKG  none ____________________________________________  RADIOLOGY  Venous doppler ultrasound, BLE  IMPRESSION: No definite evidence evidence of deep venous thrombosis bilateral lower extremity. Suboptimal study due to patient's large body habitus and lower extremity edema. ____________________________________________   PROCEDURES  Procedure(s) performed: None  Critical Care performed: No  ____________________________________________   INITIAL IMPRESSION / ASSESSMENT AND PLAN / ED COURSE  Pertinent labs & imaging results that were available during my care of the patient were reviewed by me and considered in my medical decision making (see chart for details).  Marisa HuaGordon R Gosling is a 65 y.o. male  with COPD, CHF, chronic 6 L home oxygen requirement, coronary artery disease, obesity, diabetes who presents for evaluation of spreading right leg redness as well as right leg pain since yesterday. He is also had chills. On  exam, he is nontoxic appearing and in no acute distress, vital signs are stable, he is afebrile. He has what appears to be severe pitting edema of the right lower extremity with an overlying cellulitis. His right leg is so swollen that he is now developing fluid-filled blisters. He does report that he has not taken his Lasix in 2 days because he forgot. Given rapid spread of cellulitis since onset, his chills, underlying comorbidities, we'll obtain screening labs, venous Doppler ultrasound to rule out DVT, we'll give Lasix and IV clindamycin and anticipate admission.  ----------------------------------------- 1:32 PM on 04/17/2016 ----------------------------------------- I reviewed the patient's labs. CBC shows anemia, hemoglobin 10.7, baseline 12.6. CMP shows creatinine elevation at 2.03 which has nearly doubled, his baseline is 1.03. Doppler ultrasound shows no DVT. Case discussed with hospitalist, Dr. Judithann SheenSparks, for admission at this time.  ____________________________________________   FINAL CLINICAL IMPRESSION(S) / ED DIAGNOSES  Final diagnoses:  Leg swelling  Cellulitis of right lower extremity  AKI (acute kidney injury) (HCC)      NEW MEDICATIONS STARTED DURING THIS VISIT:  New Prescriptions   No medications on file     Note:  This document was prepared using Dragon voice recognition software and may include unintentional dictation errors.    Gayla DossEryka A Davidson Palmieri, MD 04/17/16 1336

## 2016-04-17 NOTE — Progress Notes (Signed)
ANTIBIOTIC CONSULT NOTE - INITIAL  Pharmacy Consult for Vancomycin, Zosyn  Indication: cellulitis  Allergies  Allergen Reactions  . Metformin And Related Other (See Comments)    "shakes" per pt  . Morphine And Related Other (See Comments)    Hallucinations, sweats    Patient Measurements: Height: 5\' 8"  (172.7 cm) Weight: (!) 320 lb (145.151 kg) IBW/kg (Calculated) : 68.4 Adjusted Body Weight: 99.12 kg   Vital Signs: Temp: 97.8 F (36.6 C) (06/10 1117) Temp Source: Oral (06/10 1117) BP: 132/72 mmHg (06/10 1430) Pulse Rate: 75 (06/10 1430) Intake/Output from previous day:   Intake/Output from this shift:    Labs:  Recent Labs  04/17/16 1205  WBC 8.7  HGB 10.7*  PLT 182  CREATININE 2.03*   Estimated Creatinine Clearance: 51.5 mL/min (by C-G formula based on Cr of 2.03). No results for input(s): VANCOTROUGH, VANCOPEAK, VANCORANDOM, GENTTROUGH, GENTPEAK, GENTRANDOM, TOBRATROUGH, TOBRAPEAK, TOBRARND, AMIKACINPEAK, AMIKACINTROU, AMIKACIN in the last 72 hours.   Microbiology: No results found for this or any previous visit (from the past 720 hour(s)).  Medical History: Past Medical History  Diagnosis Date  . High blood pressure   . Heart attack (HCC)   . Asthma   . High cholesterol   . Seasonal allergies   . Sleep apnea   . Scoliosis   . COPD (chronic obstructive pulmonary disease) (HCC)     Medications:   (Not in a hospital admission) Assessment: Pharmacy consulted to dose vancomycin and zosyn in this 65 year old male admitted with cellulitis.  Ke = 0.047 hr-1 T1/2 = 14.7 hrs Vd = 69.4 L   Goal of Therapy:  Vancomycin trough level 10-15 mcg/ml  Plan:  Expected duration 7 days with resolution of temperature and/or normalization of WBC   Will start zosyn 4.5 gm IV Q8H EI on 6/10.   Vancomycin 1250 mg IV X 1 to be given on 6/10 @ 16:00. Vancomycin 1250 mg IV Q18H ordered to start 6/10 @ 22:00, ~ 6 hrs after 1st dose (stacked dosing). This pt will  reach [facility] on 6/13 @ 16:00. Due to risk of accumulation, will draw 1st trough on 6/12 @ 9:30, which will not be at Css.   Ellanora Rayborn D 04/17/2016,2:46 PM

## 2016-04-17 NOTE — ED Notes (Addendum)
Pt sts that he noticed leg swelling/reddness in R leg.  Pt sts pain 10/10, able to feel and wiggle toes. Pts leg warm to touch, blisters noted to R leg and swelling noted leg, pt sts that he had bandages changed Friday by home health nurse who suggested he come to ED.  A/Ox4. Resp even and unlabored.  C/O incr pain w/ ambulation

## 2016-04-18 ENCOUNTER — Inpatient Hospital Stay: Admit: 2016-04-18 | Payer: Medicare Other

## 2016-04-18 DIAGNOSIS — L03116 Cellulitis of left lower limb: Secondary | ICD-10-CM

## 2016-04-18 LAB — CBC
HCT: 32 % — ABNORMAL LOW (ref 40.0–52.0)
Hemoglobin: 10.4 g/dL — ABNORMAL LOW (ref 13.0–18.0)
MCH: 28.6 pg (ref 26.0–34.0)
MCHC: 32.3 g/dL (ref 32.0–36.0)
MCV: 88.5 fL (ref 80.0–100.0)
PLATELETS: 168 10*3/uL (ref 150–440)
RBC: 3.62 MIL/uL — ABNORMAL LOW (ref 4.40–5.90)
RDW: 15 % — AB (ref 11.5–14.5)
WBC: 8.3 10*3/uL (ref 3.8–10.6)

## 2016-04-18 LAB — COMPREHENSIVE METABOLIC PANEL
ALT: 12 U/L — ABNORMAL LOW (ref 17–63)
AST: 12 U/L — AB (ref 15–41)
Albumin: 2.8 g/dL — ABNORMAL LOW (ref 3.5–5.0)
Alkaline Phosphatase: 85 U/L (ref 38–126)
Anion gap: 8 (ref 5–15)
BILIRUBIN TOTAL: 0.9 mg/dL (ref 0.3–1.2)
BUN: 38 mg/dL — AB (ref 6–20)
CO2: 34 mmol/L — ABNORMAL HIGH (ref 22–32)
Calcium: 9 mg/dL (ref 8.9–10.3)
Chloride: 96 mmol/L — ABNORMAL LOW (ref 101–111)
Creatinine, Ser: 1.65 mg/dL — ABNORMAL HIGH (ref 0.61–1.24)
GFR, EST AFRICAN AMERICAN: 49 mL/min — AB (ref >60)
GFR, EST NON AFRICAN AMERICAN: 42 mL/min — AB (ref >60)
Glucose, Bld: 92 mg/dL (ref 65–99)
POTASSIUM: 4 mmol/L (ref 3.5–5.1)
Sodium: 138 mmol/L (ref 135–145)
TOTAL PROTEIN: 8.3 g/dL — AB (ref 6.5–8.1)

## 2016-04-18 LAB — PROTEIN / CREATININE RATIO, URINE
CREATININE, URINE: 47 mg/dL
Protein Creatinine Ratio: 0.49 mg/mg{Cre} — ABNORMAL HIGH (ref 0.00–0.15)
Total Protein, Urine: 23 mg/dL

## 2016-04-18 LAB — GLUCOSE, CAPILLARY
GLUCOSE-CAPILLARY: 74 mg/dL (ref 65–99)
GLUCOSE-CAPILLARY: 74 mg/dL (ref 65–99)
Glucose-Capillary: 141 mg/dL — ABNORMAL HIGH (ref 65–99)
Glucose-Capillary: 64 mg/dL — ABNORMAL LOW (ref 65–99)
Glucose-Capillary: 99 mg/dL (ref 65–99)

## 2016-04-18 NOTE — Progress Notes (Signed)
Patient ID: [name], male   DOB: [birth year], 65 y.o.   MRN: [id number] [facility] [place] [facility]   PATIENT NAME: [name]    MR#:  [id number]  DATE OF BIRTH:  [birth year]  SUBJECTIVE:    REVIEW OF SYSTEMS:   ROS Tolerating Diet: Tolerating PT:   DRUG ALLERGIES:   Allergies  Allergen Reactions  . Metformin And Related Other (See Comments)    "shakes" per pt  . Morphine And Related Other (See Comments)    Hallucinations, sweats    VITALS:  Blood pressure 121/66, pulse 72, temperature 98.3 F (36.8 C), temperature source Oral, resp. rate 18, height 5\' 8"  (1.727 m), weight 145.151 kg (320 lb), SpO2 96 %.  PHYSICAL EXAMINATION:   Physical Exam  GENERAL:  65 y.o.-year-old patient lying in the bed with no acute distress.  EYES: Pupils equal, round, reactive to light and accommodation. No scleral icterus. Extraocular muscles intact.  HEENT: Head atraumatic, normocephalic. Oropharynx and nasopharynx clear.  NECK:  Supple, no jugular venous distention. No thyroid enlargement, no tenderness.  LUNGS: Normal breath sounds bilaterally, no wheezing, rales, rhonchi. No use of accessory muscles of respiration.  CARDIOVASCULAR: S1, S2 normal. No murmurs, rubs, or gallops.  ABDOMEN: Soft, nontender, nondistended. Bowel sounds present. No organomegaly or mass.  EXTREMITIES: No cyanosis, clubbing or edema b/l.    NEUROLOGIC: Cranial nerves II through XII are intact. No focal Motor or sensory deficits b/l.   PSYCHIATRIC:  patient is alert and oriented x 3.  SKIN: No obvious rash, lesion, or ulcer.   LABORATORY PANEL:  CBC  Recent Labs Lab 04/18/16 0522  WBC 8.3  HGB 10.4*  HCT 32.0*  PLT 168    Chemistries   Recent Labs Lab 04/18/16 0522  NA 138  K 4.0  CL 96*  CO2 34*  GLUCOSE 92  BUN 38*  CREATININE 1.65*  CALCIUM 9.0  AST 12*  ALT 12*  ALKPHOS 85  BILITOT 0.9   Cardiac Enzymes No results for input(s): TROPONINI in  the last 168 hours. RADIOLOGY:  Dg Chest 1 View  04/17/2016  CLINICAL DATA:  Pt to ED c/o worsening right lower extremity redness, pain, and swelling since yesterday, also reports recent weight gain, h/o chf, copd, at home oxygen 2-3L EXAM: CHEST 1 VIEW COMPARISON:  12/21/2015 FINDINGS: Stable mild cardiac enlargement. Vascular congestion similar to prior study. Mild bibasilar opacities also similar to prior study. No definite pulmonary edema. IMPRESSION: Vascular congestion with hypoventilatory change at both bases similar to prior study Electronically Signed   By: Esperanza Heiraymond  Rubner M.D.   On: 04/17/2016 14:30   [place] Venous Img Lower Bilateral  04/17/2016  CLINICAL DATA:  Bilateral swelling for 1 day EXAM: BILATERAL LOWER EXTREMITY VENOUS DOPPLER ULTRASOUND TECHNIQUE: Gray-scale sonography with graded compression, as well as color Doppler and duplex ultrasound were performed to evaluate the lower extremity deep venous systems from the level of the common femoral vein and including the common femoral, femoral, profunda femoral, popliteal and calf veins including the posterior tibial, peroneal and gastrocnemius veins when visible. The superficial great saphenous vein was also interrogated. Spectral Doppler was utilized to evaluate flow at rest and with distal augmentation maneuvers in the common femoral, femoral and popliteal veins. COMPARISON:  None. FINDINGS: RIGHT LOWER EXTREMITY Common Femoral Vein: No evidence of thrombus. Normal compressibility, respiratory phasicity and response to augmentation. Saphenofemoral Junction: No evidence of thrombus. Normal compressibility and flow on color Doppler imaging. Profunda Femoral  Vein: No evidence of thrombus. Normal compressibility and flow on color Doppler imaging. Femoral Vein: No evidence of thrombus. Normal compressibility, respiratory phasicity and response to augmentation. Popliteal Vein: No evidence of thrombus. Normal compressibility, respiratory phasicity and  response to augmentation. Calf Veins: No evidence of thrombus. Normal compressibility and flow on color Doppler imaging. Superficial Great Saphenous Vein: No evidence of thrombus. Normal compressibility and flow on color Doppler imaging. Venous Reflux:  None. Other Findings:  None. LEFT LOWER EXTREMITY Common Femoral Vein: No evidence of thrombus. Normal compressibility, respiratory phasicity and response to augmentation. Saphenofemoral Junction: No evidence of thrombus. Normal compressibility and flow on color Doppler imaging. Profunda Femoral Vein: No evidence of thrombus. Normal compressibility and flow on color Doppler imaging. Femoral Vein: No evidence of thrombus. Normal compressibility, respiratory phasicity and response to augmentation. Popliteal Vein: No evidence of thrombus. Normal compressibility, respiratory phasicity and response to augmentation. Calf Veins: No evidence of thrombus. Normal compressibility and flow on color Doppler imaging. Superficial Great Saphenous Vein: No evidence of thrombus. Normal compressibility and flow on color Doppler imaging. Venous Reflux:  None. Other Findings:  None. IMPRESSION: No definite evidence evidence of deep venous thrombosis bilateral lower extremity. Suboptimal study due to patient's large body habitus and lower extremity edema. Electronically Signed   By: Natasha Mead M.D.   On: 04/17/2016 13:00   ASSESSMENT AND PLAN:  [name] is a 65 y.o. male has a past medical history significant for morbid obesity, chronic respiratory failure on O2, chronic CHF and OSA now with progressive RLE swelling and pain with redness. Has also noted weight gain with worsening SOB. No fever. Denies Cp or palpitations. In ER, pt stable on 2L O2 but renal function acutely worse and LE edema massive  1.Cellulitis of right  Leg with blistering -had unna boot before pt came here -send WC -cont IV vanc and zosyn -Follow blood cultures. -wound consult  2. Acute on chronic  bilateral lower extremity edema. -Continue IV Lasix Monitor I's and O's and creatinine closely  3. Chronic hypoxic respiratory failure due to CHF chronic and obstructive sleep apnea on CPAP -Continue Lasix and cardiac meds -Patient does not have acute congestive heart failure  4. Acute on chronic renal failure baseline creatinine around 1.5. Came in with creatinine of 2.03 down to 1.65 Avoid nephrotoxins. -Nephrology consultation  5. OSA on CPAP  6. Physical therapy   Case discussed with Care Management/Social Worker. Management plans discussed with the patient, family and they are in agreement.  CODE STATUS: full DVT Prophylaxis: heparin  TOTAL TIME TAKING CARE OF THIS PATIENT: 40 minutes.  >50% time spent on counselling and coordination of care  POSSIBLE D/C IN 1-2 DAYS, DEPENDING ON CLINICAL CONDITION.  Note: This dictation was prepared with Dragon dictation along with smaller phrase technology. Any transcriptional errors that result from this process are unintentional.  Carzell Saldivar M.D on 04/18/2016 at 10:11 AM  Between 7am to 6pm - Pager - [phone]  After 6pm go to [email or web address] - password [email or web address] [facility]  [facility]  Office  [phone]  CC: Primary care physician; Sherrie Mustache, MD

## 2016-04-18 NOTE — Progress Notes (Signed)
Reading Dr Allena KatzPatel note  DOES NOT HAVE CHF  So will not see pt at this point call if we can be of assistance

## 2016-04-18 NOTE — Progress Notes (Signed)
PT Cancellation Note  Patient Details Name: [name] MRN: [id number] DOB: [birth year]   Cancelled Treatment:    Reason Eval/Treat Not Completed: Patient declined, no reason specified Pt reports that his L leg has been draining/weeping and is hurting a lot right now.  He agrees to work with PT tomorrow, but refuses to do anything today.   Malachi ProGalen R Mahogany Torrance 04/18/2016, 4:44 PM

## 2016-04-18 NOTE — Consult Note (Signed)
Date: 04/18/2016                  Patient Name:  [name]  MRN: [id number]  DOB: 1951-09-22  Age / Sex: 65 y.o., male         PCP: Sherrie Mustache, MD                 Service Requesting Consult: Internal medicine/Dr. Jearl Klinefelter Patel/Dr Sparks                 Reason for Consult: Acute renal failure            History of Present Illness: Patient is a 66 y.o. male with medical problems of morbid obesity, chronic respiratory failure, chronic congestive heart failure, sleep apnea, not using CPAP, type 2 diabetes for over 10 years, history of MI, history of kidney stone February 2017, who was admitted to [facility] on 04/17/2016 for evaluation of worsening lower extremity edema and redness and blistering of his right leg.   Patient states that he has chronic lower extremity edema.  He had Unna boots that were wrapped by home health services. He came in because of pain, swelling, redness of his right leg along with blistering on the lateral side. Currently he is being treated for cellulitis with vancomycin, clindamycin, Zosyn  Nephrology team has been consulted for acute renal failure.  His baseline creatinine appears to be 1.03/GFR greater than 60 Admission creatinine was 2.03 Today's creatinine has improved to 1.65  He is currently getting furosemide 40 mg IV twice a day for lower extremity edema   Medications: Outpatient medications: Prescriptions prior to admission  Medication Sig Dispense Refill Last Dose  . aspirin EC 81 MG tablet Take 81 mg by mouth daily.   04/16/2016 at Unknown time  . celecoxib (CELEBREX) 200 MG capsule Take 200 mg by mouth daily.   04/16/2016 at Unknown time  . clopidogrel (PLAVIX) 75 MG tablet Take 75 mg by mouth daily.   04/16/2016 at Unknown time  . dexlansoprazole (DEXILANT) 60 MG capsule Take 60 mg by mouth daily.   04/16/2016 at Unknown time  . furosemide (LASIX) 40 MG tablet Take 40 mg by mouth 2 (two) times daily.    04/16/2016 at Unknown time  . glipiZIDE (GLUCOTROL  XL) 5 MG 24 hr tablet Take 5 mg by mouth 2 (two) times daily.   04/16/2016 at Unknown time  . Liraglutide (VICTOZA) 18 MG/3ML SOPN Inject 1.8 mg into the skin daily.   04/16/2016 at Unknown time  . nebivolol (BYSTOLIC) 10 MG tablet Take 10 mg by mouth every morning. *Note dose*   04/16/2016 at Unknown time  . Nebivolol HCl (BYSTOLIC) 20 MG TABS Take 20 mg by mouth at bedtime. *note dose*   04/16/2016 at Unknown time  . OXYGEN Inhale 2 L into the lungs continuous.   04/17/2016 at Unknown time  . polyethylene glycol powder (GLYCOLAX/MIRALAX) powder Dissolve one heaping tablespoon in 4-8 ounces of juice or water and drink once daily. 255 g 0 04/16/2016 at Unknown time  . rosuvastatin (CRESTOR) 10 MG tablet Take 10 mg by mouth daily.   04/16/2016 at Unknown time  . tamsulosin (FLOMAX) 0.4 MG CAPS capsule Take 0.4 mg by mouth daily.   04/16/2016 at Unknown time  . telmisartan (MICARDIS) 80 MG tablet Take 80 mg by mouth daily.   04/16/2016 at Unknown time  . ondansetron (ZOFRAN ODT) 4 MG disintegrating tablet Take 1 tablet (4 mg total) by mouth  every 8 (eight) hours as needed for nausea or vomiting. 8 tablet 0   . oxyCODONE (ROXICODONE) 5 MG immediate release tablet Take 1 tablet (5 mg total) by mouth every 6 (six) hours as needed for moderate pain. Do not drive while taking this medication. 8 tablet 0     Current medications: Current Facility-Administered Medications  Medication Dose Route Frequency Provider Last Rate Last Dose  . acetaminophen (TYLENOL) tablet 650 mg  650 mg Oral Q6H PRN Marguarite ArbourJeffrey D Sparks, MD       Or  . acetaminophen (TYLENOL) suppository 650 mg  650 mg Rectal Q6H PRN Marguarite ArbourJeffrey D Sparks, MD      . aspirin EC tablet 81 mg  81 mg Oral Daily Marguarite ArbourJeffrey D Sparks, MD   81 mg at 04/18/16 0959  . bisacodyl (DULCOLAX) suppository 10 mg  10 mg Rectal Daily PRN Marguarite ArbourJeffrey D Sparks, MD      . clopidogrel (PLAVIX) tablet 75 mg  75 mg Oral Daily Marguarite ArbourJeffrey D Sparks, MD   75 mg at 04/18/16 1001  . docusate sodium (COLACE)  capsule 100 mg  100 mg Oral BID Marguarite ArbourJeffrey D Sparks, MD   100 mg at 04/18/16 1002  . furosemide (LASIX) injection 40 mg  40 mg Intravenous Q12H Marguarite ArbourJeffrey D Sparks, MD   40 mg at 04/18/16 1003  . glipiZIDE (GLUCOTROL XL) 24 hr tablet 5 mg  5 mg Oral BID Marguarite ArbourJeffrey D Sparks, MD   5 mg at 04/18/16 1002  . heparin injection 5,000 Units  5,000 Units Subcutaneous Q8H Marguarite ArbourJeffrey D Sparks, MD   5,000 Units at 04/18/16 0546  . insulin aspart (novoLOG) injection 0-9 Units  0-9 Units Subcutaneous TID WC Marguarite ArbourJeffrey D Sparks, MD   0 Units at 04/17/16 1614  . irbesartan (AVAPRO) tablet 300 mg  300 mg Oral Daily Marguarite ArbourJeffrey D Sparks, MD   300 mg at 04/18/16 0957  . nebivolol (BYSTOLIC) tablet 10 mg  10 mg Oral BH-q7a Marguarite ArbourJeffrey D Sparks, MD   10 mg at 04/18/16 1002  . nebivolol (BYSTOLIC) tablet 20 mg  20 mg Oral QHS Marguarite ArbourJeffrey D Sparks, MD   20 mg at 04/17/16 2133  . ondansetron (ZOFRAN) tablet 4 mg  4 mg Oral Q6H PRN Marguarite ArbourJeffrey D Sparks, MD       Or  . ondansetron [facility](ZOFRAN) injection 4 mg  4 mg Intravenous Q6H PRN Marguarite ArbourJeffrey D Sparks, MD      . oxyCODONE (Oxy IR/ROXICODONE) immediate release tablet 5 mg  5 mg Oral Q4H PRN Marguarite ArbourJeffrey D Sparks, MD   5 mg at 04/18/16 0957  . pantoprazole (PROTONIX) EC tablet 40 mg  40 mg Oral Daily Marguarite ArbourJeffrey D Sparks, MD   40 mg at 04/18/16 1000  . piperacillin-tazobactam (ZOSYN) IVPB 4.5 g  4.5 g Intravenous Q8H Marguarite ArbourJeffrey D Sparks, MD   4.5 g at 04/18/16 0546  . rosuvastatin (CRESTOR) tablet 10 mg  10 mg Oral Daily Marguarite ArbourJeffrey D Sparks, MD   10 mg at 04/18/16 1000  . sodium chloride flush (NS) 0.9 % injection 3 mL  3 mL Intravenous Q12H Marguarite ArbourJeffrey D Sparks, MD   3 mL at 04/18/16 1005  . tamsulosin (FLOMAX) capsule 0.4 mg  0.4 mg Oral Daily Marguarite ArbourJeffrey D Sparks, MD   0.4 mg at 04/18/16 1014  . vancomycin (VANCOCIN) 1,250 mg in sodium chloride 0.9 % 250 mL IVPB  1,250 mg Intravenous Q18H Marguarite ArbourJeffrey D Sparks, MD   1,250 mg at 04/17/16 2133      Allergies: Allergies  Allergen Reactions  . Metformin And Related Other (See  Comments)    "shakes" per pt  . Morphine And Related Other (See Comments)    Hallucinations, sweats      Past Medical History: Past Medical History  Diagnosis Date  . High blood pressure   . Heart attack (HCC)   . Asthma   . High cholesterol   . Seasonal allergies   . Sleep apnea   . Scoliosis   . COPD (chronic obstructive pulmonary disease) (HCC)      Past Surgical History: Past Surgical History  Procedure Laterality Date  . Back sx      L4 L5   . Carpal tunnel release Right      Family History: Family History  Problem Relation Age of Onset  . Heart disease Father      Social History: Social History   Social History  . Marital Status: Married    Spouse Name: N/A  . Number of Children: N/A  . Years of Education: N/A   Occupational History  . Not on file.   Social History Main Topics  . Smoking status: Never Smoker   . Smokeless tobacco: Never Used  . Alcohol Use: No  . Drug Use: No  . Sexual Activity: Not on file   Other Topics Concern  . Not on file   Social History Narrative     Review of Systems: Gen: +intentional weight loss. >500 lbs to current weight HEENT: denies any vision problems, hearing problems CV: Limited mobility, no chest pain, no shortness of breath Resp: requires O2 by nasal cannula, used to be on CPAP but stopped by a pulmonologist per patient GI:no complaint of nausea, vomiting, diarrhea or constipation GU : no problems with voiding, history of blood in urine, history of right kidney stone in February 2017 MS: Limited mobility due to morbid obesity Derm:  chronic venous stasis changes over lower extremities, right leg edema blisters Psych: no problems reported Heme: no complaints Neuro: no complaints Endocrine.  diabetes for more than 10 years  Vital Signs: Blood pressure 121/66, pulse 72, temperature 98.3 F (36.8 C), temperature source Oral, resp. rate 18, height 5\' 8"  (1.727 m), weight 145.151 kg (320 lb), SpO2 96  %.   Intake/Output Summary (Last 24 hours) at 04/18/16 1135 Last data filed at 04/18/16 1128  Gross per 24 hour  Intake   1735 ml  Output   1300 ml  Net    435 ml    Weight trends: [employer]   04/17/16 1117  Weight: 145.151 kg (320 lb)    Physical Exam: General:  no acute distress, sitting up in the chair  HEENT Anicteric, moist oral mucous membranes  Neck:  supple  Lungs: Oxygen by nasal cannula, normal effort, clear to auscultation  Heart::  regular rhythm, soft systolic murmur present  Abdomen: Obese, soft, nontender  Extremities: 2 to 3+ pitting edema, greater than left  Neurologic: Alert, oriented  Skin: Right leg redness, right leg edema blisters on lateral side, chronic venous stasis changes             Lab results: Basic Metabolic Panel:  Recent Labs Lab 04/17/16 1205 04/18/16 0522  NA 135 138  K 4.1 4.0  CL 93* 96*  CO2 34* 34*  GLUCOSE 94 92  BUN 46* 38*  CREATININE 2.03* 1.65*  CALCIUM 9.7 9.0    Liver Function Tests:  Recent Labs Lab 04/18/16 0522  AST 12*  ALT 12*  ALKPHOS  85  BILITOT 0.9  PROT 8.3*  ALBUMIN 2.8*   No results for input(s): LIPASE, AMYLASE in the last 168 hours. No results for input(s): AMMONIA in the last 168 hours.  CBC:  Recent Labs Lab 04/17/16 1205 04/18/16 0522  WBC 8.7 8.3  NEUTROABS 6.8*  --   HGB 10.7* 10.4*  HCT 33.7* 32.0*  MCV 88.4 88.5  PLT 182 168    Cardiac Enzymes: No results for input(s): CKTOTAL, TROPONINI in the last 168 hours.  BNP: Invalid input(s): POCBNP  CBG:  Recent Labs Lab 04/17/16 1552 04/17/16 2118 04/18/16 0745 04/18/16 1126  GLUCAP 107* 107* 74 74    Microbiology: No results found for this or any previous visit (from the past 720 hour(s)).   Coagulation Studies: No results for input(s): LABPROT, INR in the last 72 hours.  Urinalysis: No results for input(s): COLORURINE, LABSPEC, PHURINE, GLUCOSEU, HGBUR, BILIRUBINUR, KETONESUR, PROTEINUR, UROBILINOGEN,  NITRITE, LEUKOCYTESUR in the last 72 hours.  Invalid input(s): APPERANCEUR      Imaging: Dg Chest 1 View  04/17/2016  CLINICAL DATA:  Pt to ED c/o worsening right lower extremity redness, pain, and swelling since yesterday, also reports recent weight gain, h/o chf, copd, at home oxygen 2-3L EXAM: CHEST 1 VIEW COMPARISON:  12/21/2015 FINDINGS: Stable mild cardiac enlargement. Vascular congestion similar to prior study. Mild bibasilar opacities also similar to prior study. No definite pulmonary edema. IMPRESSION: Vascular congestion with hypoventilatory change at both bases similar to prior study Electronically Signed   By: Esperanza Heir M.D.   On: 04/17/2016 14:30   US Venous Img Lower Bilateral  04/17/2016  CLINICAL DATA:  Bilateral swelling for 1 day EXAM: BILATERAL LOWER EXTREMITY VENOUS DOPPLER ULTRASOUND TECHNIQUE: Gray-scale sonography with graded compression, as well as color Doppler and duplex ultrasound were performed to evaluate the lower extremity deep venous systems from the level of the common femoral vein and including the common femoral, femoral, profunda femoral, popliteal and calf veins including the posterior tibial, peroneal and gastrocnemius veins when visible. The superficial great saphenous vein was also interrogated. Spectral Doppler was utilized to evaluate flow at rest and with distal augmentation maneuvers in the common femoral, femoral and popliteal veins. COMPARISON:  None. FINDINGS: RIGHT LOWER EXTREMITY Common Femoral Vein: No evidence of thrombus. Normal compressibility, respiratory phasicity and response to augmentation. Saphenofemoral Junction: No evidence of thrombus. Normal compressibility and flow on color Doppler imaging. Profunda Femoral Vein: No evidence of thrombus. Normal compressibility and flow on color Doppler imaging. Femoral Vein: No evidence of thrombus. Normal compressibility, respiratory phasicity and response to augmentation. Popliteal Vein: No evidence  of thrombus. Normal compressibility, respiratory phasicity and response to augmentation. Calf Veins: No evidence of thrombus. Normal compressibility and flow on color Doppler imaging. Superficial Great Saphenous Vein: No evidence of thrombus. Normal compressibility and flow on color Doppler imaging. Venous Reflux:  None. Other Findings:  None. LEFT LOWER EXTREMITY Common Femoral Vein: No evidence of thrombus. Normal compressibility, respiratory phasicity and response to augmentation. Saphenofemoral Junction: No evidence of thrombus. Normal compressibility and flow on color Doppler imaging. Profunda Femoral Vein: No evidence of thrombus. Normal compressibility and flow on color Doppler imaging. Femoral Vein: No evidence of thrombus. Normal compressibility, respiratory phasicity and response to augmentation. Popliteal Vein: No evidence of thrombus. Normal compressibility, respiratory phasicity and response to augmentation. Calf Veins: No evidence of thrombus. Normal compressibility and flow on color Doppler imaging. Superficial Great Saphenous Vein: No evidence of thrombus. Normal compressibility and flow on color Doppler  imaging. Venous Reflux:  None. Other Findings:  None. IMPRESSION: No definite evidence evidence of deep venous thrombosis bilateral lower extremity. Suboptimal study due to patient's large body habitus and lower extremity edema. Electronically Signed   By: Natasha Mead M.D.   On: 04/17/2016 13:00      Assessment & Plan: Pt is a 65 y.o. yo male with with medical problems of morbid obesity, chronic respiratory failure, chronic congestive heart failure, sleep apnea, not using CPAP, type 2 diabetes for over 10 years, history of MI, history of kidney stone February 2017, who was admitted to [facility] on 04/17/2016 for evaluation of worsening lower extremity edema and redness and blistering of his right leg.   1. Acute renal failure  Baseline creatinine 1.03 Admission creatinine to 103 Serum creatinine  has improved to 1.65 Nonoliguric  2.  Severe lower extremity edema Likely multifactorial including lymphedema, excessive salt/fluid intake, possible pulmonary hypertension Agree with IV Lasix Sodium restricted diet Elevate lower extremities   3.  Obtain urine protein/creatinine ratio

## 2016-04-19 ENCOUNTER — Inpatient Hospital Stay (HOSPITAL_COMMUNITY)
Admit: 2016-04-19 | Discharge: 2016-04-19 | Disposition: A | Discharge status: Home / Self Care | Payer: Medicare Other | Attending: Internal Medicine | Admitting: Internal Medicine

## 2016-04-19 ENCOUNTER — Inpatient Hospital Stay: Payer: Medicare Other

## 2016-04-19 DIAGNOSIS — I509 Heart failure, unspecified: Secondary | ICD-10-CM

## 2016-04-19 LAB — BASIC METABOLIC PANEL
Anion gap: 9 (ref 5–15)
BUN: 27 mg/dL — AB (ref 6–20)
CHLORIDE: 92 mmol/L — AB (ref 101–111)
CO2: 33 mmol/L — ABNORMAL HIGH (ref 22–32)
Calcium: 9.1 mg/dL (ref 8.9–10.3)
Creatinine, Ser: 1.58 mg/dL — ABNORMAL HIGH (ref 0.61–1.24)
GFR calc Af Amer: 52 mL/min — ABNORMAL LOW (ref >60)
GFR calc non Af Amer: 45 mL/min — ABNORMAL LOW (ref >60)
GLUCOSE: 123 mg/dL — AB (ref 65–99)
POTASSIUM: 3.6 mmol/L (ref 3.5–5.1)
Sodium: 134 mmol/L — ABNORMAL LOW (ref 135–145)

## 2016-04-19 LAB — GLUCOSE, CAPILLARY
GLUCOSE-CAPILLARY: 77 mg/dL (ref 65–99)
GLUCOSE-CAPILLARY: 137 mg/dL — AB (ref 65–99)
GLUCOSE-CAPILLARY: 141 mg/dL — AB (ref 65–99)
Glucose-Capillary: 76 mg/dL (ref 65–99)

## 2016-04-19 LAB — ECHOCARDIOGRAM COMPLETE
HEIGHTINCHES: 68 in
Weight: 5120 oz

## 2016-04-19 MED ORDER — CLINDAMYCIN PHOSPHATE 600 MG/50ML IV SOLN
600.0000 mg | Freq: Three times a day (TID) | INTRAVENOUS | Status: DC
  Filled 2016-04-19 (×3): qty 50

## 2016-04-19 MED ORDER — PIPERACILLIN-TAZOBACTAM 4.5 G IVPB
4.5000 g | Freq: Three times a day (TID) | INTRAVENOUS | Status: DC
  Administered 2016-04-19 – 2016-04-20 (×3): 4.5 g via INTRAVENOUS
  Filled 2016-04-19 (×5): qty 100

## 2016-04-19 MED ORDER — GLIPIZIDE ER 2.5 MG PO TB24
2.5000 mg | ORAL_TABLET | Freq: Two times a day (BID) | ORAL | Status: DC
  Administered 2016-04-19 – 2016-04-20 (×2): 2.5 mg via ORAL
  Filled 2016-04-19 (×2): qty 1

## 2016-04-19 NOTE — Progress Notes (Signed)
*  PRELIMINARY RESULTS* Echocardiogram 2D Echocardiogram has been performed.  Georgann HousekeeperJerry R Hege 04/19/2016, 2:35 PM

## 2016-04-19 NOTE — Progress Notes (Signed)
Patient ID: LADEN FIELDHOUSE, male   DOB: 1950/12/10, 65 y.o.   MRN: [id number] [facility] [place] [facility]   PATIENT NAME: [name]    MR#:  [id number]  DATE OF BIRTH:  Feb 13, 1951  SUBJECTIVE:  The patient came in with increasing redness in his right lower extremity with bullous blisters over the right knee area. No fever. Significant swelling in both lower extremities more on the right than the left.  REVIEW OF SYSTEMS:   Review of Systems  Constitutional: Negative for fever, chills and weight loss.  HENT: Negative for ear discharge, ear pain and nosebleeds.   Eyes: Negative for blurred vision, pain and discharge.  Respiratory: Negative for sputum production, shortness of breath, wheezing and stridor.   Cardiovascular: Positive for leg swelling. Negative for chest pain, palpitations, orthopnea and PND.  Gastrointestinal: Negative for nausea, vomiting, abdominal pain and diarrhea.  Genitourinary: Negative for urgency and frequency.  Musculoskeletal: Negative for back pain and joint pain.  Skin: Positive for rash.       Cellulitis right lower extremity  Neurological: Negative for sensory change, speech change, focal weakness and weakness.  Psychiatric/Behavioral: Negative for depression and hallucinations. The patient is not nervous/anxious.   All other systems reviewed and are negative.  Tolerating Diet:yes Tolerating PT: pending  DRUG ALLERGIES:   Allergies  Allergen Reactions  . Metformin And Related Other (See Comments)    "shakes" per pt  . Morphine And Related Other (See Comments)    Hallucinations, sweats    VITALS:  Blood pressure 129/64, pulse 70, temperature 98 F (36.7 C), temperature source Oral, resp. rate 18, height  (1.727 m), weight 145.151 kg (320 lb), SpO2 81 %.  PHYSICAL EXAMINATION:   Physical Exam  GENERAL:  65 y.o.-year-old patient lying in the bed with no acute distress.  EYES: Pupils equal, round,  reactive to light and accommodation. No scleral icterus. Extraocular muscles intact.  HEENT: Head atraumatic, normocephalic. Oropharynx and nasopharynx clear.  NECK:  Supple, no jugular venous distention. No thyroid enlargement, no tenderness.  LUNGS: Normal breath sounds bilaterally, no wheezing, rales, rhonchi. No use of accessory muscles of respiration.  CARDIOVASCULAR: S1, S2 normal. No murmurs, rubs, or gallops.  ABDOMEN: Soft, nontender, nondistended. Bowel sounds present. No organomegaly or mass.  EXTREMITIES: No cyanosis, clubbing ,+++edema b/l.   Cellulitis to the right leg with significant edema and blisters over the right popliteal area and calf area. NEUROLOGIC: Cranial nerves II through XII are intact. No focal Motor or sensory deficits b/l.   PSYCHIATRIC:  patient is alert and oriented x 3.  SKIN: No obvious rash, lesion, or ulcer.   LABORATORY PANEL:  CBC  Recent Labs Lab 04/18/16 0522  WBC 8.3  HGB 10.4*  HCT 32.0*  PLT 168    Chemistries   Recent Labs Lab 04/18/16 0522  NA 138  K 4.0  CL 96*  CO2 34*  GLUCOSE 92  BUN 38*  CREATININE 1.65*  CALCIUM 9.0  AST 12*  ALT 12*  ALKPHOS 85  BILITOT 0.9   Cardiac Enzymes No results for input(s): TROPONINI in the last 168 hours. RADIOLOGY:  Dg Chest 1 View  04/17/2016  CLINICAL DATA:  Pt to ED c/o worsening right lower extremity redness, pain, and swelling since yesterday, also reports recent weight gain, h/o chf, copd, at home oxygen 2-3L EXAM: CHEST 1 VIEW COMPARISON:  12/21/2015 FINDINGS: Stable mild cardiac enlargement. Vascular congestion similar to prior study. Mild bibasilar opacities  also similar to prior study. No definite pulmonary edema. IMPRESSION: Vascular congestion with hypoventilatory change at both bases similar to prior study Electronically Signed   By: Esperanza Heiraymond  Rubner M.D.   On: 04/17/2016 14:30   ASSESSMENT AND PLAN:  [name] is a 65 y.o. male has a past medical history significant for  morbid obesity, chronic respiratory failure on O2, chronic CHF and OSA now with progressive RLE swelling and pain with redness. Has also noted weight gain with worsening SOB. No fever. Denies Cp or palpitations. In ER, pt stable on 2L O2 but renal function acutely worse and LE edema massive  1.Cellulitis of right  Leg with blistering -had unna boot before pt came here - WC growing gram-negative rods -cont IV zosyn -blood cultures negative so far -wound consult appreciated. Spoke with Clydie BraunKaren from one consult. She dressed up the blisters with calcium alginate and wrap the lower extremity with Unna boot  2. Acute on chronic bilateral lower extremity edema. -Continue IV Lasix Monitor I's and O's and creatinine closely -Changed to oral Lasix in the morning. Patient has had good urine output  3. Chronic hypoxic respiratory failure due to CHF chronic and obstructive sleep apnea on CPAP -Continue Lasix and cardiac meds -Patient does not have acute congestive heart failure  4. Acute on chronic renal failure baseline creatinine around 1.5. Came in with creatinine of 2.03 down to 1.65 Avoid nephrotoxins. -Nephrology consultation  5. OSA on CPAP  6. Physical therapy to see patient.   Case discussed with Care Management/Social Worker. Management plans discussed with the patient, family and they are in agreement.  CODE STATUS: full DVT Prophylaxis: heparin  TOTAL TIME TAKING CARE OF THIS PATIENT: 30 minutes.  >50% time spent on counselling and coordination of care  POSSIBLE D/C IN 1-2 DAYS, DEPENDING ON CLINICAL CONDITION.  Note: This dictation was prepared with Dragon dictation along with smaller phrase technology. Any transcriptional errors that result from this process are unintentional.  Shellia Hartl M.D on 04/19/2016 at 1:22 PM  Between 7am to 6pm - Pager - [phone]  After 6pm go to [email or web address] - password [email or web address]  Office   [phone]  CC: Primary care physician; Sherrie MustacheFayegh Jadali, MD

## 2016-04-19 NOTE — Evaluation (Signed)
Physical Therapy Evaluation Patient Details Name: [name] R Medici MRN: [id number] DOB: [birth year] Today's Date: 04/19/2016   History of Present Illness  [name] is a 65yo black male who [facility] after worsening progressive LEE and Leg pain. PMH: CHF, OSA. At baseline the pt perfoms household distances only c RW on 2L O2.   Clinical Impression  Upon entry, the patient is received seated in chair with feet down, wife present who helps with subjective information. The pt is awake and agreeable to participate. Pt received on and remaining on 2L O2 throughout gait trial, after which he is noted to drop to low 80's% and requires 4L/min x60sec to recover to 91%. Functional mobility assessment demonstrates moderate to severe weakness, the pt requiring maximal effort to perform transfers and gait, however he endorses this to be near baseline. The patient is reluctant to elevate legs due to pain, however by reclining to some degree and using a pillow under legs, is able to tolerate leg elevation.    Patient presenting with impairment of strength, range of motion, balance, and activity tolerance, limiting ability to perform ADL and mobility tasks at  baseline level of function. Patient will benefit from skilled intervention to address the above impairments and limitations, in order to restore to prior level of function, improve patient safety upon discharge, and to decrease falls risk.       Follow Up Recommendations Home health PT    Equipment Recommendations  None recommended by PT    Recommendations for Other Services       Precautions / Restrictions Precautions Precautions: Fall      Mobility  Bed Mobility               General bed mobility comments: received up in chair.   Transfers Overall transfer level: Needs assistance Equipment used: Rolling walker (2 wheeled) Transfers: Sit to/from Stand Sit to Stand: Supervision         General transfer comment: Takes 30  seconds to rise from chair, require maximal effort and careful sequential placement of hands due to low chair height. He uses the RW in ways not typically described as 'safe' but with a high level fo fluency.   Ambulation/Gait Ambulation/Gait assistance: Min guard Ambulation Distance (Feet): 28 Feet Assistive device: Rolling walker (2 wheeled)       General Gait Details: antalgic, weak, and forward flexed; stops halfway to move hands to center pole of RW and forward flexes with SOB. After 7828ft on 2L, pt drops to 81% SaO2, wherein the flow rat eis incrased to 4L/min and SaO2 recovers to 91% after 60 seconds.   Stairs            Wheelchair Mobility    Modified Rankin (Stroke Patients Only)       Balance Overall balance assessment: No apparent balance deficits (not formally assessed);History of Falls;Modified Independent                                           Pertinent Vitals/Pain Pain Assessment: 0-10 Pain Score: 8  Pain Location: RLE edematous, weeping wounds.  Pain Intervention(s): Limited activity within patient's tolerance;Monitored during session;Repositioned    Home Living Family/patient expects to be discharged to:: Private residence Living Arrangements: Spouse/significant other Available Help at Discharge: Family Type of Home: House Home Access: Ramped entrance     Home Layout: One level  Home Equipment: Walker - 2 wheels;Walker - 4 wheels;Other (comment) (O2)      Prior Function Level of Independence: Independent with assistive device(s);Needs assistance   Gait / Transfers Assistance Needed: Househol distances only at baseline >27yr.   ADL's / Homemaking Assistance Needed: MinA for bathing/dressingl; has a personal care attendant 2d/week.         Hand Dominance        Extremity/Trunk Assessment               Lower Extremity Assessment:  (LEE, weeping from blisters in areas. )         Communication   Communication:  No difficulties  Cognition Arousal/Alertness: Awake/alert Behavior During Therapy: WFL for tasks assessed/performed Overall Cognitive Status: Difficult to assess       Memory: Decreased short-term memory (mildlyinaccurate details provided, wife must correct PRN, but does not appear surprised at this. )              General Comments      Exercises        Assessment/Plan    PT Assessment Patient needs continued PT services  PT Diagnosis Difficulty walking;Abnormality of gait;Generalized weakness   PT Problem List Decreased strength;Decreased range of motion;Decreased activity tolerance;Decreased balance;Decreased mobility;Decreased cognition;Cardiopulmonary status limiting activity;Obesity;Decreased skin integrity;Pain;Decreased safety awareness  PT Treatment Interventions DME instruction;Gait training;Functional mobility training;Therapeutic activities;Therapeutic exercise;Balance training;Patient/family education   PT Goals (Current goals can be found in the Care Plan section) Acute Rehab PT Goals Patient Stated Goal: reduce pain in legs, walk father without SOB.  PT Goal Formulation: With patient Time For Goal Achievement: 2016-05-19 Potential to Achieve Goals: Fair    Frequency Min 2X/week   Barriers to discharge Inaccessible home environment      Co-evaluation               End of Session Equipment Utilized During Treatment: Gait belt;Oxygen Activity Tolerance: Patient limited by fatigue;Patient limited by pain;Treatment limited secondary to medical complications (Comment) (Unstable O2 c activity. ) Patient left: in chair;with call bell/phone within reach;with family/visitor present;Other (comment) (Feet elevated.) Nurse Communication: Other (comment)         Time: 1610-9604 PT Time Calculation (min) (ACUTE ONLY): 25 min   Charges:   PT Evaluation $PT Eval Moderate Complexity: 1 Procedure PT Treatments $Therapeutic Activity: 8-22 mins   PT G Codes:         9:45 AM, 05/19/16 Rosamaria Lints, PT, DPT PRN Physical Therapist - [facility] [address] License # [id number] [phone] ([phone] (mobile)

## 2016-04-19 NOTE — Plan of Care (Signed)
Problem: Acute Rehab PT Goals(only PT should resolve) Goal: Pt Will Ambulate And SaO2 >89% on 2L/min.

## 2016-04-19 NOTE — Progress Notes (Signed)
Patient ID: [name] R Haan, male   DOB: [birth year], 65 y.o.   MRN: [id number] [facility] [place] [facility]   PATIENT NAME: Kris MoutonGordon Thurlow    MR#:  [id number]  DATE OF BIRTH:  [birth year]  SUBJECTIVE:  The patient came in with increasing redness in his right lower extremity with bullous blisters over the right knee area. No fever. Significant swelling in both lower extremities more on the right than the left.  REVIEW OF SYSTEMS:   Review of Systems  Constitutional: Negative for fever, chills and weight loss.  HENT: Negative for ear discharge, ear pain and nosebleeds.   Eyes: Negative for blurred vision, pain and discharge.  Respiratory: Negative for sputum production, shortness of breath, wheezing and stridor.   Cardiovascular: Positive for leg swelling. Negative for chest pain, palpitations, orthopnea and PND.  Gastrointestinal: Negative for nausea, vomiting, abdominal pain and diarrhea.  Genitourinary: Negative for urgency and frequency.  Musculoskeletal: Negative for back pain and joint pain.  Skin: Positive for rash.       Cellulitis right lower extremity  Neurological: Negative for sensory change, speech change, focal weakness and weakness.  Psychiatric/Behavioral: Negative for depression and hallucinations. The patient is not nervous/anxious.   All other systems reviewed and are negative.  Tolerating Diet:yes Tolerating PT: pending  DRUG ALLERGIES:   Allergies  Allergen Reactions  . Metformin And Related Other (See Comments)    "shakes" per pt  . Morphine And Related Other (See Comments)    Hallucinations, sweats    VITALS:  Blood pressure 129/64, pulse 70, temperature 98 F (36.7 C), temperature source Oral, resp. rate 18, height 5\' 8"  (1.727 m), weight 145.151 kg (320 lb), SpO2 81 %.  PHYSICAL EXAMINATION:   Physical Exam  GENERAL:  65 y.o.-year-old patient lying in the bed with no acute distress.  EYES: Pupils equal, round,  reactive to light and accommodation. No scleral icterus. Extraocular muscles intact.  HEENT: Head atraumatic, normocephalic. Oropharynx and nasopharynx clear.  NECK:  Supple, no jugular venous distention. No thyroid enlargement, no tenderness.  LUNGS: Normal breath sounds bilaterally, no wheezing, rales, rhonchi. No use of accessory muscles of respiration.  CARDIOVASCULAR: S1, S2 normal. No murmurs, rubs, or gallops.  ABDOMEN: Soft, nontender, nondistended. Bowel sounds present. No organomegaly or mass.  EXTREMITIES: No cyanosis, clubbing ,+++edema b/l.   Cellulitis to the right leg with significant edema and blisters over the right popliteal area and calf area. NEUROLOGIC: Cranial nerves II through XII are intact. No focal Motor or sensory deficits b/l.   PSYCHIATRIC:  patient is alert and oriented x 3.  SKIN: No obvious rash, lesion, or ulcer.   LABORATORY PANEL:  CBC  Recent Labs Lab 04/18/16 0522  WBC 8.3  HGB 10.4*  HCT 32.0*  PLT 168    Chemistries   Recent Labs Lab 04/18/16 0522  NA 138  K 4.0  CL 96*  CO2 34*  GLUCOSE 92  BUN 38*  CREATININE 1.65*  CALCIUM 9.0  AST 12*  ALT 12*  ALKPHOS 85  BILITOT 0.9   Cardiac Enzymes No results for input(s): TROPONINI in the last 168 hours. RADIOLOGY:  Dg Chest 1 View  04/17/2016  CLINICAL DATA:  Pt to ED c/o worsening right lower extremity redness, pain, and swelling since yesterday, also reports recent weight gain, h/o chf, copd, at home oxygen 2-3L EXAM: CHEST 1 VIEW COMPARISON:  12/21/2015 FINDINGS: Stable mild cardiac enlargement. Vascular congestion similar to prior study. Mild bibasilar opacities  also similar to prior study. No definite pulmonary edema. IMPRESSION: Vascular congestion with hypoventilatory change at both bases similar to prior study Electronically Signed   By: Esperanza Heir M.D.   On: 04/17/2016 14:30   ASSESSMENT AND PLAN:  HARWOOD NALL is a 65 y.o. male has a past medical history significant for  morbid obesity, chronic respiratory failure on O2, chronic CHF and OSA now with progressive RLE swelling and pain with redness. Has also noted weight gain with worsening SOB. No fever. Denies Cp or palpitations. In ER, pt stable on 2L O2 but renal function acutely worse and LE edema massive  1.Cellulitis of right  Leg with blistering -had unna boot before pt came here -send WC -cont IV vanc and zosyn -Follow blood cultures. -wound consult  2. Acute on chronic bilateral lower extremity edema. -Continue IV Lasix Monitor I's and O's and creatinine closely  3. Chronic hypoxic respiratory failure due to CHF chronic and obstructive sleep apnea on CPAP -Continue Lasix and cardiac meds -Patient does not have acute congestive heart failure  4. Acute on chronic renal failure baseline creatinine around 1.5. Came in with creatinine of 2.03 down to 1.65 Avoid nephrotoxins. -Nephrology consultation  5. OSA on CPAP  6. Physical therapy   Case discussed with Care Management/Social Worker. Management plans discussed with the patient, family and they are in agreement.  CODE STATUS: full DVT Prophylaxis: heparin  TOTAL TIME TAKING CARE OF THIS PATIENT: 40 minutes.  >50% time spent on counselling and coordination of care  POSSIBLE D/C IN 1-2 DAYS, DEPENDING ON CLINICAL CONDITION.  Note: This dictation was prepared with Dragon dictation along with smaller phrase technology. Any transcriptional errors that result from this process are unintentional.  Caty Tessler M.D on 04/19/2016 at 1:21 PM  Between 7am to 6pm - Pager - [phone]  After 6pm go to [email or web address] - password [email or web address] [facility]  [place] [facility]  Office  [phone]  CC: Primary care physician; Sherrie Mustache, MD

## 2016-04-19 NOTE — Care Management (Signed)
Admitted to [facility] with the diagnosis cellulitis of left leg. Lives with wife, Mellody LifeMary Dillie, [phone]). Last seen Dr. Ara KussmaulJadyali 3 months ago. [facility]. Aide comes 2 x week. Nurse comes Monday, Wednesday, and Friday for unaboot. No skilled facility in the past. Home oxygen through [facility] x 3 years, Good appetite. Larey SeatFell coming up ramp 1-2 months ago. No Life Alert, Needs help with bathing and dressing. Uses a rolling walker and cane to aide in ambulation. [facility]. So and wife will transport per private Rada Hayvan Glennis Borger S Babe Anthis RN MSN CCM Care Management [phone]

## 2016-04-19 NOTE — Care Management Important Message (Signed)
Important Message  Patient Details  Name: [name] MRN: [id number] Date of Birth: [birth year]   Medicare Important Message Given:  Yes    Gwenette GreetBrenda S Mahaila Tischer, RN 04/19/2016, 9:42 AM

## 2016-04-19 NOTE — Consult Note (Signed)
WOC wound consult note Reason for Consult:Bilateral venous insufficiency with right lower leg cellulitis.  Serum filled blistering present to right lateral leg and right medial malleolus. Erythema and generalized edema to bilateral lower legs.  Wears Unnas boots, changed twice weekly.  Wound type:Venous insufficiency with cellulitis.  Pressure Ulcer POA: N/A Measurement:8 cm x 8 cm blistering to lateral leg, near the knee.  6 cm x 3 cm blistering to medial malleolus Wound ZOX:WRUEAbed:Serum filled blisters Drainage (amount, consistency, odor) Generalized weeping.  No odor.  Periwound:Erythema and edema Dressing procedure/placement/frequency:Cleanse bilateral lower legs with soap and water.  Pat gently dry.  Apply calcium alginate to blistering for absorption and to promote moist wound healing.  Wrap with zinc layer secured with self adherent Coban.  Change twice weekly.  Will not follow at this time.  Please re-consult if needed.  Maple HudsonKaren Katasha Riga RN BSN CWON Pager [phone]

## 2016-04-19 NOTE — Progress Notes (Signed)
Pt requesting to d/c cardiac monitor.  Per Dr. Allena KatzPatel ok to d/c telemetry.  Orson Apeanielle Amal Saiki, RN

## 2016-04-19 NOTE — Progress Notes (Signed)
[facility]  ROUNDING NOTE   Subjective:  Creatinine down to 1.65. Patient still has considerable swelling of the right lower extremity as well as cellulitis. Patient sitting up in chair this a.m.   Objective:  Vital signs in last 24 hours:  Temp:  [98 F (36.7 C)-98.4 F (36.9 C)] 98 F (36.7 C) (06/12 0518) Pulse Rate:  [70-85] 70 (06/12 0941) Resp:  [18-20] 18 (06/12 0518) BP: (122-129)/(60-81) 129/64 mmHg (06/12 0941) SpO2:  [81 %-95 %] 81 % (06/12 0927)  Weight change:  Filed Weights   04/17/16 1117  Weight: 145.151 kg (320 lb)    Intake/Output: I/O last 3 completed shifts: In: 1975 [P.O.:480; I.V.:1045; IV Piggyback:450] Out: 3300 [Urine:3300]   Intake/Output this shift:  Total I/O In: 120 [P.O.:120] Out: 200 [Urine:200]  Physical Exam: General: Obese male, NAD  Head: Normocephalic, atraumatic. Moist oral mucosal membranes  Eyes: Anicteric  Neck: Supple, trachea midline  Lungs:  Clear to auscultation, normal effort  Heart: S1S2 no rubs  Abdomen:  Soft, nontender, BS present   Extremities: R + L LE edema, cellulitis noted in RLE.  Neurologic: Nonfocal, moving all four extremities  Skin Cellulitis noted in RLE, chronic venous stasis changes       Basic Metabolic Panel:  Recent Labs Lab 04/17/16 1205 04/18/16 0522  NA 135 138  K 4.1 4.0  CL 93* 96*  CO2 34* 34*  GLUCOSE 94 92  BUN 46* 38*  CREATININE 2.03* 1.65*  CALCIUM 9.7 9.0    Liver Function Tests:  Recent Labs Lab 04/17/16 1205 04/18/16 0522  AST 16 12*  ALT 14* 12*  ALKPHOS 83 85  BILITOT 0.6 0.9  PROT 9.4* 8.3*  ALBUMIN 3.2* 2.8*   No results for input(s): LIPASE, AMYLASE in the last 168 hours. No results for input(s): AMMONIA in the last 168 hours.  CBC:  Recent Labs Lab 04/17/16 1205 04/18/16 0522  WBC 8.7 8.3  NEUTROABS 6.8*  --   HGB 10.7* 10.4*  HCT 33.7* 32.0*  MCV 88.4 88.5  PLT 182 168    Cardiac Enzymes: No results for input(s): CKTOTAL,  CKMB, CKMBINDEX, TROPONINI in the last 168 hours.  BNP: Invalid input(s): POCBNP  CBG:  Recent Labs Lab 04/18/16 1126 04/18/16 1644 04/18/16 2133 04/18/16 2157 04/19/16 0735  GLUCAP 74 141* 64* 99 76    Microbiology: Results for orders placed or performed during the hospital encounter of 04/17/16  Blood culture (routine x 2)     Status: None (Preliminary result)   Collection Time: 04/17/16 12:05 PM  Result Value Ref Range Status   Specimen Description BLOOD LEFT ANTECUBITAL  Final   Special Requests   Final    BOTTLES DRAWN AEROBIC AND ANAEROBIC  AER 7CC ANA 11CC   Culture NO GROWTH 2 DAYS  Final   Report Status PENDING  Incomplete  Blood culture (routine x 2)     Status: None (Preliminary result)   Collection Time: 04/17/16 12:05 PM  Result Value Ref Range Status   Specimen Description BLOOD LEFT ARM  Final   Special Requests   Final    BOTTLES DRAWN AEROBIC AND ANAEROBIC  AER 7CC ANA 11CC   Culture NO GROWTH 2 DAYS  Final   Report Status PENDING  Incomplete  Aerobic Culture (superficial specimen)     Status: None (Preliminary result)   Collection Time: 04/18/16  1:14 PM  Result Value Ref Range Status   Specimen Description WOUND  Final   Special Requests  NONE  Final   Gram Stain   Final    FEW WBC PRESENT,BOTH PMN AND MONONUCLEAR FEW GRAM NEGATIVE RODS    Culture PENDING  Incomplete   Report Status PENDING  Incomplete    Coagulation Studies: No results for input(s): LABPROT, INR in the last 72 hours.  Urinalysis: No results for input(s): COLORURINE, LABSPEC, PHURINE, GLUCOSEU, HGBUR, BILIRUBINUR, KETONESUR, PROTEINUR, UROBILINOGEN, NITRITE, LEUKOCYTESUR in the last 72 hours.  Invalid input(s): APPERANCEUR    Imaging: Dg Chest 1 View  04/17/2016  CLINICAL DATA:  Pt to ED c/o worsening right lower extremity redness, pain, and swelling since yesterday, also reports recent weight gain, h/o chf, copd, at home oxygen 2-3L EXAM: CHEST 1 VIEW COMPARISON:   12/21/2015 FINDINGS: Stable mild cardiac enlargement. Vascular congestion similar to prior study. Mild bibasilar opacities also similar to prior study. No definite pulmonary edema. IMPRESSION: Vascular congestion with hypoventilatory change at both bases similar to prior study Electronically Signed   By: Esperanza Heiraymond  Rubner M.D.   On: 04/17/2016 14:30   [place] Venous Img Lower Bilateral  04/17/2016  CLINICAL DATA:  Bilateral swelling for 1 day EXAM: BILATERAL LOWER EXTREMITY VENOUS DOPPLER ULTRASOUND TECHNIQUE: Gray-scale sonography with graded compression, as well as color Doppler and duplex ultrasound were performed to evaluate the lower extremity deep venous systems from the level of the common femoral vein and including the common femoral, femoral, profunda femoral, popliteal and calf veins including the posterior tibial, peroneal and gastrocnemius veins when visible. The superficial great saphenous vein was also interrogated. Spectral Doppler was utilized to evaluate flow at rest and with distal augmentation maneuvers in the common femoral, femoral and popliteal veins. COMPARISON:  None. FINDINGS: RIGHT LOWER EXTREMITY Common Femoral Vein: No evidence of thrombus. Normal compressibility, respiratory phasicity and response to augmentation. Saphenofemoral Junction: No evidence of thrombus. Normal compressibility and flow on color Doppler imaging. Profunda Femoral Vein: No evidence of thrombus. Normal compressibility and flow on color Doppler imaging. Femoral Vein: No evidence of thrombus. Normal compressibility, respiratory phasicity and response to augmentation. Popliteal Vein: No evidence of thrombus. Normal compressibility, respiratory phasicity and response to augmentation. Calf Veins: No evidence of thrombus. Normal compressibility and flow on color Doppler imaging. Superficial Great Saphenous Vein: No evidence of thrombus. Normal compressibility and flow on color Doppler imaging. Venous Reflux:  None. Other  Findings:  None. LEFT LOWER EXTREMITY Common Femoral Vein: No evidence of thrombus. Normal compressibility, respiratory phasicity and response to augmentation. Saphenofemoral Junction: No evidence of thrombus. Normal compressibility and flow on color Doppler imaging. Profunda Femoral Vein: No evidence of thrombus. Normal compressibility and flow on color Doppler imaging. Femoral Vein: No evidence of thrombus. Normal compressibility, respiratory phasicity and response to augmentation. Popliteal Vein: No evidence of thrombus. Normal compressibility, respiratory phasicity and response to augmentation. Calf Veins: No evidence of thrombus. Normal compressibility and flow on color Doppler imaging. Superficial Great Saphenous Vein: No evidence of thrombus. Normal compressibility and flow on color Doppler imaging. Venous Reflux:  None. Other Findings:  None. IMPRESSION: No definite evidence evidence of deep venous thrombosis bilateral lower extremity. Suboptimal study due to patient's large body habitus and lower extremity edema. Electronically Signed   By: Natasha MeadLiviu  Pop M.D.   On: 04/17/2016 13:00     Medications:     . aspirin EC  81 mg Oral Daily  . clopidogrel  75 mg Oral Daily  . docusate sodium  100 mg Oral BID  . furosemide  40 mg Intravenous Q12H  . glipiZIDE  5 mg Oral BID  . heparin  5,000 Units Subcutaneous Q8H  . insulin aspart  0-9 Units Subcutaneous TID WC  . irbesartan  300 mg Oral Daily  . nebivolol  10 mg Oral BH-q7a  . nebivolol  20 mg Oral QHS  . pantoprazole  40 mg Oral Daily  . piperacillin-tazobactam (ZOSYN)  IV  4.5 g Intravenous Q8H  . rosuvastatin  10 mg Oral Daily  . sodium chloride flush  3 mL Intravenous Q12H  . tamsulosin  0.4 mg Oral Daily   acetaminophen **OR** acetaminophen, bisacodyl, ondansetron **OR** ondansetron (ZOFRAN) IV, oxyCODONE  Assessment/ Plan:  65 y.o. male  with with medical problems of morbid obesity, chronic respiratory failure, chronic congestive heart  failure, sleep apnea, not using CPAP, type 2 diabetes for over 10 years, history of MI, history of kidney stone February 2017, who was admitted to [facility] on 04/17/2016 for evaluation of worsening lower extremity edema and redness and blistering of his right leg.   1. Acute renal failure/proteinuria Baseline creatinine 1.03 Admission creatinine to 103 - No new creatinine today. Therefore we will repeat renal function testing today. Also obtain renal ultrasound to exclude obstruction. Also check SPEP and UPEP.  2. Severe lower extremity edema - Continue Lasix 40 mg IV every 12 hours.  3. Hypertension. Blood pressure 129/64. Continue irbesartan.    LOS: 2 Leon Goodnow 6/12/201711:48 AM

## 2016-04-19 NOTE — Progress Notes (Signed)
Inpatient Diabetes Program Recommendations  AACE/ADA: New Consensus Statement on Inpatient Glycemic Control (2015)  Target Ranges:  Prepandial:   less than 140 mg/dL      Peak postprandial:   less than 180 mg/dL (1-2 hours)      Critically ill patients:  140 - 180 mg/dL  Results for Marisa HuaWOODS, Wilford R (MRN [id number]) as of 04/19/2016 09:24  Ref. Range 04/18/2016 07:45 04/18/2016 11:26 04/18/2016 16:44 04/18/2016 21:33 04/18/2016 21:57 04/19/2016 07:35  Glucose-Capillary Latest Ref Range: 65-99 mg/dL 74 74 409141 (H) 64 (L) 99 76    Review of Glycemic Control  Diabetes history: DM2 Outpatient Diabetes medications: Glipizide XL 5 BId, Victoza 1.8 daily Current orders for Inpatient glycemic control: Glipizide 5 mg BID, Novolog 0-9 units TID with meals  Inpatient Diabetes Program Recommendations: Oral Agents: Glucose has ranged from 64-141 mg/dl over the past 24 hours and fasting glucose is 76 mg/dl this morning. May want to consider discontinuing Glipizide while inpatient.  Thanks, Orlando PennerMarie Sherle Mello, RN, MSN, CDE Diabetes Coordinator Inpatient Diabetes Program [phone] (Team Pager from 8am to 5pm) [phone] ([facility] office) [phone] [facility] office) [phone] [facility] office)

## 2016-04-19 NOTE — Progress Notes (Signed)
Per pt he cannot lay flat for renal US.  Per Dr. Cherylann RatelLateef d/c US. Orson Apeanielle Jodi Kappes, RN

## 2016-04-20 ENCOUNTER — Other Ambulatory Visit: Payer: Self-pay | Admitting: *Deleted

## 2016-04-20 LAB — GLUCOSE, CAPILLARY
GLUCOSE-CAPILLARY: 86 mg/dL (ref 65–99)
GLUCOSE-CAPILLARY: 99 mg/dL (ref 65–99)

## 2016-04-20 LAB — BASIC METABOLIC PANEL
Anion gap: 8 (ref 5–15)
BUN: 28 mg/dL — ABNORMAL HIGH (ref 6–20)
CHLORIDE: 93 mmol/L — AB (ref 101–111)
CO2: 36 mmol/L — ABNORMAL HIGH (ref 22–32)
CREATININE: 1.58 mg/dL — AB (ref 0.61–1.24)
Calcium: 9.2 mg/dL (ref 8.9–10.3)
GFR, EST AFRICAN AMERICAN: 52 mL/min — AB (ref >60)
GFR, EST NON AFRICAN AMERICAN: 45 mL/min — AB (ref >60)
Glucose, Bld: 87 mg/dL (ref 65–99)
POTASSIUM: 3.6 mmol/L (ref 3.5–5.1)
SODIUM: 137 mmol/L (ref 135–145)

## 2016-04-20 LAB — PROTEIN ELECTROPHORESIS, SERUM
A/G Ratio: 0.5 — ABNORMAL LOW (ref 0.7–1.7)
ALBUMIN ELP: 2.6 g/dL — AB (ref 2.9–4.4)
ALPHA-1-GLOBULIN: 0.4 g/dL (ref 0.0–0.4)
ALPHA-2-GLOBULIN: 1.1 g/dL — AB (ref 0.4–1.0)
Beta Globulin: 1.2 g/dL (ref 0.7–1.3)
GAMMA GLOBULIN: 2.4 g/dL — AB (ref 0.4–1.8)
GLOBULIN, TOTAL: 5.1 g/dL — AB (ref 2.2–3.9)
Total Protein ELP: 7.7 g/dL (ref 6.0–8.5)

## 2016-04-20 MED ORDER — AMOXICILLIN-POT CLAVULANATE 875-125 MG PO TABS: 1.0000 | ORAL_TABLET | Freq: Two times a day (BID) | ORAL | Status: DC

## 2016-04-20 MED ORDER — KETOROLAC TROMETHAMINE 30 MG/ML IJ SOLN
30.0000 mg | Freq: Once | INTRAMUSCULAR | Status: AC
Start: 2016-04-20 — End: 2016-04-20
  Administered 2016-04-20: 30 mg via INTRAVENOUS
  Filled 2016-04-20: qty 1

## 2016-04-20 MED ORDER — OXYCODONE HCL 5 MG PO TABS: 5.0000 mg | ORAL_TABLET | Freq: Four times a day (QID) | ORAL | Status: DC | PRN

## 2016-04-20 NOTE — Care Management (Signed)
Discharge to home today per Dr. Elpidio AnisSudini. [facility]. Discussed transportation with wife earlier this week. States  She and son will transport per private Pearlie OysterVan Maleni Seyer s Marcelle OverlieHolland RN MSN CCM Care Management [phone]

## 2016-04-20 NOTE — Consult Note (Signed)
   [facility] CM Inpatient Consult   04/20/2016  [name] 11-Sep-1951 [id number]   Chart review revealed patient eligible for [facility] Management services with a diagnosis of HF, and DM type 2 for post hospital discharge follow up. Patient was evaluated for community based chronic disease management services with [facility] care Management Program as a benefit of patient's [employer]. Met with the patient and spouse at the bedside to explain [facility] Management services. Patient endorses his primary care provider to be Dr. Casilda Carls. Patient states he plans to go home with home health services. Consent form signed. Patient stated his best contact number is [phone] and also gave permission to speak with his spouse [name] at [phone]. Patient will receive post hospital discharge calls and be evaluated for monthly home visits. [facility] Care Management services does not interfere with or replace any services arranged by the inpatient care management team. RNCM left contact information and [facility] literature at the bedside. Made inpatient RNCM aware that [facility] will be following for care management. For additional questions please contact:   Kynzleigh Bandel RN, [facility] Liaison  [phone]) Business Mobile [phone]) Toll free office

## 2016-04-20 NOTE — Progress Notes (Signed)
MD making rounds. Discharge orders received. IV removed. A Prescription E-scribed to pharmacy. A Prescription for pain medication  given to patient. Provided with Education Handout. Discharge paperwork provided, explained, signed and witnessed. No unanswered questions. Discharged via wheelchair by Nursing Staff. Portable oxygen tank available by family. Belongings sent with patient and family.

## 2016-04-20 NOTE — Progress Notes (Signed)
[facility]  ROUNDING NOTE   Subjective:  Creatinine continues to decline. Currently 1.58. Patient sitting up in chair.   Objective:  Vital signs in last 24 hours:  Temp:  [97.7 F (36.5 C)-98.7 F (37.1 C)] 98.1 F (36.7 C) (06/13 1011) Pulse Rate:  [67-79] 71 (06/13 1011) Resp:  [18-20] 18 (06/13 1011) BP: (128-157)/(66-76) 128/75 mmHg (06/13 1011) SpO2:  [95 %-99 %] 96 % (06/13 1011)  Weight change:  Filed Weights   04/17/16 1117  Weight: 145.151 kg (320 lb)    Intake/Output: I/O last 3 completed shifts: In: 900 [P.O.:600; IV Piggyback:300] Out: 2575 [Urine:2575]   Intake/Output this shift:  Total I/O In: 240 [P.O.:240] Out: 350 [Urine:350]  Physical Exam: General: Obese male, NAD  Head: Normocephalic, atraumatic. Moist oral mucosal membranes  Eyes: Anicteric  Neck: Supple, trachea midline  Lungs:  Clear to auscultation, normal effort  Heart: S1S2 no rubs  Abdomen:  Soft, nontender, BS present   Extremities: R + L LE edema, both legs wrapped  Neurologic: Nonfocal, moving all four extremities  Skin LE's wrapped       Basic Metabolic Panel:  Recent Labs Lab 04/17/16 1205 04/18/16 0522 04/19/16 1636 04/20/16 0427  NA 135 138 134* 137  K 4.1 4.0 3.6 3.6  CL 93* 96* 92* 93*  CO2 34* 34* 33* 36*  GLUCOSE 94 92 123* 87  BUN 46* 38* 27* 28*  CREATININE 2.03* 1.65* 1.58* 1.58*  CALCIUM 9.7 9.0 9.1 9.2    Liver Function Tests:  Recent Labs Lab 04/17/16 1205 04/18/16 0522  AST 16 12*  ALT 14* 12*  ALKPHOS 83 85  BILITOT 0.6 0.9  PROT 9.4* 8.3*  ALBUMIN 3.2* 2.8*   No results for input(s): LIPASE, AMYLASE in the last 168 hours. No results for input(s): AMMONIA in the last 168 hours.  CBC:  Recent Labs Lab 04/17/16 1205 04/18/16 0522  WBC 8.7 8.3  NEUTROABS 6.8*  --   HGB 10.7* 10.4*  HCT 33.7* 32.0*  MCV 88.4 88.5  PLT 182 168    Cardiac Enzymes: No results for input(s): CKTOTAL, CKMB, CKMBINDEX, TROPONINI in the  last 168 hours.  BNP: Invalid input(s): POCBNP  CBG:  Recent Labs Lab 04/19/16 1201 04/19/16 1649 04/19/16 2059 04/20/16 0741 04/20/16 1119  GLUCAP 77 137* 141* 86 99    Microbiology: Results for orders placed or performed during the hospital encounter of 04/17/16  Blood culture (routine x 2)     Status: None (Preliminary result)   Collection Time: 04/17/16 12:05 PM  Result Value Ref Range Status   Specimen Description BLOOD LEFT ANTECUBITAL  Final   Special Requests   Final    BOTTLES DRAWN AEROBIC AND ANAEROBIC  AER 7CC ANA 11CC   Culture NO GROWTH 3 DAYS  Final   Report Status PENDING  Incomplete  Blood culture (routine x 2)     Status: None (Preliminary result)   Collection Time: 04/17/16 12:05 PM  Result Value Ref Range Status   Specimen Description BLOOD LEFT ARM  Final   Special Requests   Final    BOTTLES DRAWN AEROBIC AND ANAEROBIC  AER 7CC ANA 11CC   Culture NO GROWTH 3 DAYS  Final   Report Status PENDING  Incomplete  Aerobic Culture (superficial specimen)     Status: None (Preliminary result)   Collection Time: 04/18/16  1:14 PM  Result Value Ref Range Status   Specimen Description WOUND  Final   Special Requests NONE  Final   Gram Stain   Final    FEW WBC PRESENT,BOTH PMN AND MONONUCLEAR FEW GRAM NEGATIVE RODS    Culture   Final    NO GROWTH < 24 HOURS [facility]    Report Status PENDING  Incomplete    Coagulation Studies: No results for input(s): LABPROT, INR in the last 72 hours.  Urinalysis: No results for input(s): COLORURINE, LABSPEC, PHURINE, GLUCOSEU, HGBUR, BILIRUBINUR, KETONESUR, PROTEINUR, UROBILINOGEN, NITRITE, LEUKOCYTESUR in the last 72 hours.  Invalid input(s): APPERANCEUR    Imaging: No results found.   Medications:     . aspirin EC  81 mg Oral Daily  . clopidogrel  75 mg Oral Daily  . docusate sodium  100 mg Oral BID  . furosemide  40 mg Intravenous Q12H  . glipiZIDE  2.5 mg Oral BID WC  .  heparin  5,000 Units Subcutaneous Q8H  . insulin aspart  0-9 Units Subcutaneous TID WC  . irbesartan  300 mg Oral Daily  . nebivolol  10 mg Oral BH-q7a  . nebivolol  20 mg Oral QHS  . pantoprazole  40 mg Oral Daily  . piperacillin-tazobactam (ZOSYN)  IV  4.5 g Intravenous Q8H  . rosuvastatin  10 mg Oral Daily  . sodium chloride flush  3 mL Intravenous Q12H  . tamsulosin  0.4 mg Oral Daily   acetaminophen **OR** acetaminophen, bisacodyl, ondansetron **OR** ondansetron (ZOFRAN) IV, oxyCODONE  Assessment/ Plan:  65 y.o. male  with with medical problems of morbid obesity, chronic respiratory failure, chronic congestive heart failure, sleep apnea, not using CPAP, type 2 diabetes for over 10 years, history of MI, history of kidney stone February 2017, who was [facility]17/2016 for evaluation of worsening lower extremity edema and redness and blistering of his right leg.   1. Acute renal failure/proteinuria Baseline creatinine 1.03 Admission creatinine to 103 - Patient did not have renal ultrasound performed as he was unable to lay flat. Creatinine down to 1.58. He is being discharged. We recommend that his renal function be retested as an outpatient.  2. Severe lower extremity edema - Patient likely to be transitioned to by mouth Lasix upon discharge..  3. Hypertension. Blood pressure remains under good control at 128/75. Continue irbesartan.    LOS: 3 Jaymon Dudek 6/13/201711:46 AM

## 2016-04-20 NOTE — Discharge Instructions (Addendum)
°  DIET:  Cardiac diet and Diabetic diet  DISCHARGE CONDITION:  Stable  ACTIVITY:  Activity as tolerated  OXYGEN:  Home Oxygen: Yes.     Oxygen Delivery: 2 liters/min via Patient connected to nasal cannula oxygen  DISCHARGE LOCATION:  Home with Home health   If you experience worsening of your admission symptoms, develop shortness of breath, life threatening emergency, suicidal or homicidal thoughts you must seek medical attention immediately by calling 911 or calling your MD immediately  if symptoms less severe.  You Must read complete instructions/literature along with all the possible adverse reactions/side effects for all the Medicines you take and that have been prescribed to you. Take any new Medicines after you have completely understood and accpet all the possible adverse reactions/side effects.   Please note  You were cared for by a hospitalist during your hospital stay. If you have any questions about your discharge medications or the care you received while you were in the hospital after you are discharged, you can call the unit and asked to speak with the hospitalist on call if the hospitalist that took care of you is not available. Once you are discharged, your primary care physician will handle any further medical issues. Please note that NO REFILLS for any discharge medications will be authorized once you are discharged, as it is imperative that you return to your primary care physician (or establish a relationship with a primary care physician if you do not have one) for your aftercare needs so that they can reassess your need for medications and monitor your lab values.   Cleanse bilateral lower legs with soap and water. Pat gently dry. Apply calcium alginate to blistering for absorption and to promote moist wound healing. Wrap with zinc layer secured with self adherent Coban. Change twice weekly.  Cellulitis Cellulitis is an infection of the skin and the tissue under  the skin. The infected area is usually red and tender. This happens most often in the arms and lower legs. HOME CARE   Take your antibiotic medicine as told. Finish the medicine even if you start to feel better.  Keep the infected arm or leg raised (elevated).  Put a warm cloth on the area up to 4 times per day.  Only take medicines as told by your doctor.  Keep all doctor visits as told. GET HELP IF:  You see red streaks on the skin coming from the infected area.  Your red area gets bigger or turns a dark color.  Your bone or joint under the infected area is painful after the skin heals.  Your infection comes back in the same area or different area.  You have a puffy (swollen) bump in the infected area.  You have new symptoms.  You have a fever. GET HELP RIGHT AWAY IF:   You feel very sleepy.  You throw up (vomit) or have watery poop (diarrhea).  You feel sick and have muscle aches and pains.   This information is not intended to replace advice given to you by your health care provider. Make sure you discuss any questions you have with your health care provider.   Document Released: 04/12/2008 Document Revised: 07/16/2015 Document Reviewed: 01/10/2012 Elsevier Interactive Patient Education [employer]2016 Elsevier Inc.

## 2016-04-21 DIAGNOSIS — Z7902 Long term (current) use of antithrombotics/antiplatelets: Secondary | ICD-10-CM | POA: Diagnosis not present

## 2016-04-21 DIAGNOSIS — Z7982 Long term (current) use of aspirin: Secondary | ICD-10-CM | POA: Diagnosis not present

## 2016-04-21 DIAGNOSIS — I87332 Chronic venous hypertension (idiopathic) with ulcer and inflammation of left lower extremity: Secondary | ICD-10-CM | POA: Diagnosis not present

## 2016-04-21 DIAGNOSIS — L97222 Non-pressure chronic ulcer of left calf with fat layer exposed: Secondary | ICD-10-CM | POA: Diagnosis not present

## 2016-04-21 DIAGNOSIS — E1159 Type 2 diabetes mellitus with other circulatory complications: Secondary | ICD-10-CM | POA: Diagnosis not present

## 2016-04-21 DIAGNOSIS — E11622 Type 2 diabetes mellitus with other skin ulcer: Secondary | ICD-10-CM | POA: Diagnosis not present

## 2016-04-21 DIAGNOSIS — I890 Lymphedema, not elsewhere classified: Secondary | ICD-10-CM | POA: Diagnosis not present

## 2016-04-21 DIAGNOSIS — Z9981 Dependence on supplemental oxygen: Secondary | ICD-10-CM | POA: Diagnosis not present

## 2016-04-21 DIAGNOSIS — Z4800 Encounter for change or removal of nonsurgical wound dressing: Secondary | ICD-10-CM | POA: Diagnosis not present

## 2016-04-21 DIAGNOSIS — I5022 Chronic systolic (congestive) heart failure: Secondary | ICD-10-CM | POA: Diagnosis not present

## 2016-04-21 DIAGNOSIS — Z7984 Long term (current) use of oral hypoglycemic drugs: Secondary | ICD-10-CM | POA: Diagnosis not present

## 2016-04-21 LAB — PROTEIN ELECTRO, RANDOM URINE
ALBUMIN ELP UR: 15 %
Alpha-1-Globulin, U: 5.5 %
Alpha-2-Globulin, U: 13.4 %
Beta Globulin, U: 24.8 %
GAMMA GLOBULIN, U: 41.3 %
TOTAL PROTEIN, URINE-UPE24: 4.5 mg/dL

## 2016-04-21 LAB — AEROBIC CULTURE  (SUPERFICIAL SPECIMEN): CULTURE: NO GROWTH

## 2016-04-21 LAB — AEROBIC CULTURE W GRAM STAIN (SUPERFICIAL SPECIMEN)

## 2016-04-22 ENCOUNTER — Encounter: Payer: Self-pay | Admitting: *Deleted

## 2016-04-22 ENCOUNTER — Other Ambulatory Visit: Payer: Self-pay | Admitting: *Deleted

## 2016-04-22 LAB — CULTURE, BLOOD (ROUTINE X 2)
CULTURE: NO GROWTH
CULTURE: NO GROWTH

## 2016-04-22 NOTE — Patient Outreach (Signed)
Transition of care call successful (week 1, discharged 6/13).   Spoke with pt, HIPPA verified with home address provided [address], [place], [place] [address], also [place] [address] [place] [address].   Pt reports continues to have swelling in both legs, right- red, cellulitis.   Pt reports HH RN, PT started, will only take pain medication when PT comes.  Pt reports not weighing, does not have a scale, can't find a big one, weight 300 lbs.  RN CM discussed with pt bigger scales can be obtained at [facility], also try [employer].  Pt reports he is going to ask home health if they can get him a scale first, otherwise, to look into getting one himself.   Pt reports no change in sob, on oxygen 2 liters.  Pt reports to f/u with kidney MD, Primary Care MD next week.  Discussed with pt plan to continue to follow for transition of care (31 days post discharge, weekly phone calls, a home visit).  Also discussed coworker Systems analystLaine RN CM (covering for this RN CM) will do f/u call next week, home visit scheduled with this RN CM 6/29.   Patient was recently discharged from hospital and all medications have been reviewed.  Addendum-  Called pt back to clarify primary care MD and heart MD to which spouse Corrie DandyMary reports pt's primary care MD is Dr. Dario GuardianJadali, Heart MD [name] [facility] MD).   Plan to provide update to Su HiltLaine RN CM (covering for this RN CM next week) Plan to f/u with pt 6/29- home visit Plan to inform [name] of [facility] involvement, fax barrier letter.    Shayne Alkenose M.   [name] RN CCM [facility] Care Management  [phone]

## 2016-04-23 DIAGNOSIS — E1159 Type 2 diabetes mellitus with other circulatory complications: Secondary | ICD-10-CM | POA: Diagnosis not present

## 2016-04-23 DIAGNOSIS — L97222 Non-pressure chronic ulcer of left calf with fat layer exposed: Secondary | ICD-10-CM | POA: Diagnosis not present

## 2016-04-23 DIAGNOSIS — I890 Lymphedema, not elsewhere classified: Secondary | ICD-10-CM | POA: Diagnosis not present

## 2016-04-23 DIAGNOSIS — I87332 Chronic venous hypertension (idiopathic) with ulcer and inflammation of left lower extremity: Secondary | ICD-10-CM | POA: Diagnosis not present

## 2016-04-23 DIAGNOSIS — Z7902 Long term (current) use of antithrombotics/antiplatelets: Secondary | ICD-10-CM | POA: Diagnosis not present

## 2016-04-23 DIAGNOSIS — Z4800 Encounter for change or removal of nonsurgical wound dressing: Secondary | ICD-10-CM | POA: Diagnosis not present

## 2016-04-23 DIAGNOSIS — Z7982 Long term (current) use of aspirin: Secondary | ICD-10-CM | POA: Diagnosis not present

## 2016-04-23 DIAGNOSIS — Z7984 Long term (current) use of oral hypoglycemic drugs: Secondary | ICD-10-CM | POA: Diagnosis not present

## 2016-04-23 DIAGNOSIS — E11622 Type 2 diabetes mellitus with other skin ulcer: Secondary | ICD-10-CM | POA: Diagnosis not present

## 2016-04-23 DIAGNOSIS — I5022 Chronic systolic (congestive) heart failure: Secondary | ICD-10-CM | POA: Diagnosis not present

## 2016-04-23 DIAGNOSIS — Z9981 Dependence on supplemental oxygen: Secondary | ICD-10-CM | POA: Diagnosis not present

## 2016-04-23 NOTE — Discharge Summary (Signed)
[facility]   PATIENT NAME: Ernest MoutonGordon [name]    MR#:  [id number]  DATE OF BIRTH:  November 02, 1951  DATE OF ADMISSION:  04/17/2016 ADMITTING PHYSICIAN: Marguarite ArbourJeffrey D Sparks, MD  DATE OF DISCHARGE: 04/20/2016  3:40 PM  PRIMARY CARE PHYSICIAN: Sherrie MustacheFayegh Jadali, MD   ADMISSION DIAGNOSIS:  CHF (congestive heart failure) (HCC) [I50.9] SOB (shortness of breath) [R06.02] Leg swelling [M79.89] Cellulitis of right lower extremity [L03.115] AKI (acute kidney injury) (HCC) [N17.9]  DISCHARGE DIAGNOSIS:  Principal Problem:   Cellulitis of left leg Active Problems:   OSA on CPAP   CHF (congestive heart failure) (HCC)   Chronic hypoxemic respiratory failure (HCC)   ARF (acute renal failure) (HCC)   Cellulitis of right leg   SECONDARY DIAGNOSIS:   Past Medical History  Diagnosis Date  . High blood pressure   . Heart attack (HCC)   . Asthma   . High cholesterol   . Seasonal allergies   . Sleep apnea   . Scoliosis   . COPD (chronic obstructive pulmonary disease) (HCC)      ADMITTING HISTORY  HPI: Ernest HuaGordon R Burkhammer is a 65 y.o. male has a past medical history significant for morbid obesity, chronic respiratory failure on O2, chronic CHF and OSA now with progressive RLE swelling and pain with redness. Has also noted weight gain with worsening SOB. No fever. Denies Cp or palpitations. In ER, pt stable on 2L O2 but renal function acutely worse and LE edema massive. Also noted was severe erythema with warmth and tenderness involving the entire right leg. He is now admitted  HOSPITAL COURSE:   [name] is a 65 y.o. male has a past medical history significant for morbid obesity, chronic respiratory failure on O2, chronic CHF and OSA now with progressive RLE swelling and pain with redness. Has also noted weight gain with worsening SOB. No fever. Denies Cp or palpitations. In ER, pt stable on 2L O2 but renal function acutely worse and LE edema  massive  1.Cellulitis of right Leg with blistering -had unna boot before pt came here - WC growing gram-negative rods -on IV zosyn in the hospital. Change to Augmentin at discharge. -blood cultures negative so far - Dressing with Unna boots. To be changed twice a week after discharge. Instructions given. Home health set up.  2. Acute on chronic bilateral lower extremity edema. -On IV Lasix -Changed to oral Lasix   3. Chronic hypoxic respiratory failure due to CHF chronic and obstructive sleep apnea on CPAP -Continue Lasix and cardiac meds -Patient does not have acute congestive heart failure  4. Acute on chronic renal failure baseline creatinine around 1.5. Came in with creatinine of 2.03 down to 1.65 -Nephrology consultated  5. OSA on CPAP  6. Physical therapy to see patient.  Stable for discharge home to follow-up with PCP and wound care center.  CONSULTS OBTAINED:  Treatment Team:  Mosetta PigeonHarmeet Singh, MD  DRUG ALLERGIES:   Allergies  Allergen Reactions  . Metformin And Related Other (See Comments)    "shakes" per pt  . Morphine And Related Other (See Comments)    Hallucinations, sweats    DISCHARGE MEDICATIONS:   Discharge Medication List as of 04/20/2016  2:49 PM    START taking these medications   Details  amoxicillin-clavulanate (AUGMENTIN) 875-125 MG tablet Take 1 tablet by mouth 2 (two) times daily., Starting 04/20/2016, Until Discontinued, Normal      CONTINUE these medications which have CHANGED  Details  oxyCODONE (ROXICODONE) 5 MG immediate release tablet Take 1 tablet (5 mg total) by mouth every 6 (six) hours as needed for moderate pain. Do not drive while taking this medication., Starting 04/20/2016, Until Discontinued, Print      CONTINUE these medications which have NOT CHANGED   Details  aspirin EC 81 MG tablet Take 81 mg by mouth daily., Until Discontinued, Historical Med    celecoxib (CELEBREX) 200 MG capsule Take 200 mg by mouth daily., Until  Discontinued, Historical Med    clopidogrel (PLAVIX) 75 MG tablet Take 75 mg by mouth daily., Until Discontinued, Historical Med    dexlansoprazole (DEXILANT) 60 MG capsule Take 60 mg by mouth daily., Until Discontinued, Historical Med    furosemide (LASIX) 40 MG tablet Take 40 mg by mouth 2 (two) times daily. , Until Discontinued, Historical Med    glipiZIDE (GLUCOTROL XL) 5 MG 24 hr tablet Take 5 mg by mouth 2 (two) times daily., Until Discontinued, Historical Med    Liraglutide (VICTOZA) 18 MG/3ML SOPN Inject 1.8 mg into the skin daily., Until Discontinued, Historical Med    !! nebivolol (BYSTOLIC) 10 MG tablet Take 10 mg by mouth every morning. *Note dose*, Until Discontinued, Historical Med    !! Nebivolol HCl (BYSTOLIC) 20 MG TABS Take 20 mg by mouth at bedtime. *note dose*, Until Discontinued, Historical Med    OXYGEN Inhale 2 L into the lungs continuous., Until Discontinued, Historical Med    polyethylene glycol powder (GLYCOLAX/MIRALAX) powder Dissolve one heaping tablespoon in 4-8 ounces of juice or water and drink once daily., Print    rosuvastatin (CRESTOR) 10 MG tablet Take 10 mg by mouth daily., Until Discontinued, Historical Med    tamsulosin (FLOMAX) 0.4 MG CAPS capsule Take 0.4 mg by mouth daily., Until Discontinued, Historical Med    telmisartan (MICARDIS) 80 MG tablet Take 80 mg by mouth daily., Until Discontinued, Historical Med    ondansetron (ZOFRAN ODT) 4 MG disintegrating tablet Take 1 tablet (4 mg total) by mouth every 8 (eight) hours as needed for nausea or vomiting., Starting 12/21/2015, Until Discontinued, Print     !! - Potential duplicate medications found. Please discuss with provider.      Today   VITAL SIGNS:  Blood pressure 128/75, pulse 71, temperature 98.1 F (36.7 C), temperature source Oral, resp. rate 18, height  (1.727 m), weight 145.151 kg (320 lb), SpO2 96 %.  I/O:  No intake or output data in the 24 hours ending 04/23/16  1234  PHYSICAL EXAMINATION:  Physical Exam  GENERAL:  65 y.o.-year-old patient lying in the bed with no acute distress. Morbidly obese LUNGS: Normal breath sounds bilaterally, no wheezing, rales,rhonchi or crepitation. No use of accessory muscles of respiration.  CARDIOVASCULAR: S1, S2 normal. No murmurs, rubs, or gallops.  ABDOMEN: Soft, non-tender, non-distended. Bowel sounds present. No organomegaly or mass.  NEUROLOGIC: Moves all 4 extremities. PSYCHIATRIC: The patient is alert and oriented x 3.  SKIN: Bilateral lower extremity ulcers with Unna boots.  DATA REVIEW:   CBC  Recent Labs Lab 04/18/16 0522  WBC 8.3  HGB 10.4*  HCT 32.0*  PLT 168    Chemistries   Recent Labs Lab 04/18/16 0522  04/20/16 0427  NA 138  < > 137  K 4.0  < > 3.6  CL 96*  < > 93*  CO2 34*  < > 36*  GLUCOSE 92  < > 87  BUN 38*  < > 28*  CREATININE 1.65*  < >  1.58*  CALCIUM 9.0  < > 9.2  AST 12*  --   --   ALT 12*  --   --   ALKPHOS 85  --   --   BILITOT 0.9  --   --   < > = values in this interval not displayed.  Cardiac Enzymes No results for input(s): TROPONINI in the last 168 hours.  Microbiology Results  Results for orders placed or performed during the hospital encounter of 04/17/16  Blood culture (routine x 2)     Status: None   Collection Time: 04/17/16 12:05 PM  Result Value Ref Range Status   Specimen Description BLOOD LEFT ANTECUBITAL  Final   Special Requests   Final    BOTTLES DRAWN AEROBIC AND ANAEROBIC  AER 7CC ANA 11CC   Culture NO GROWTH 5 DAYS  Final   Report Status 04/22/2016 FINAL  Final  Blood culture (routine x 2)     Status: None   Collection Time: 04/17/16 12:05 PM  Result Value Ref Range Status   Specimen Description BLOOD LEFT ARM  Final   Special Requests   Final    BOTTLES DRAWN AEROBIC AND ANAEROBIC  AER 7CC ANA 11CC   Culture NO GROWTH 5 DAYS  Final   Report Status 04/22/2016 FINAL  Final  Aerobic Culture (superficial specimen)     Status: None    Collection Time: 04/18/16  1:14 PM  Result Value Ref Range Status   Specimen Description WOUND  Final   Special Requests NONE  Final   Gram Stain   Final    FEW WBC PRESENT,BOTH PMN AND MONONUCLEAR FEW GRAM NEGATIVE RODS    Culture   Final    NO GROWTH 2 DAYS Performed at [facility]    Report Status 04/21/2016 FINAL  Final    RADIOLOGY:  No results found.  Follow up with PCP in 1 week.  Management plans discussed with the patient, family and they are in agreement.  CODE STATUS:  Code Status History    Date Active Date Inactive Code Status Order ID Comments User Context   04/17/2016  3:47 PM 04/20/2016  7:25 PM Full Code [id number]  Marguarite Arbour, MD Inpatient      TOTAL TIME TAKING CARE OF THIS PATIENT ON DAY OF DISCHARGE: more than 30 minutes.   Milagros Loll R M.D on 04/23/2016 at 12:34 PM  Between 7am to 6pm - Pager - [phone]  After 6pm go to [email or web address] - password [email or web address]  [facility]  Office  [phone]  CC: Primary care physician; Sherrie Mustache, MD  Note: This dictation was prepared with Dragon dictation along with smaller phrase technology. Any transcriptional errors that result from this process are unintentional.

## 2016-04-26 DIAGNOSIS — I87332 Chronic venous hypertension (idiopathic) with ulcer and inflammation of left lower extremity: Secondary | ICD-10-CM | POA: Diagnosis not present

## 2016-04-26 DIAGNOSIS — Z7982 Long term (current) use of aspirin: Secondary | ICD-10-CM | POA: Diagnosis not present

## 2016-04-26 DIAGNOSIS — I5022 Chronic systolic (congestive) heart failure: Secondary | ICD-10-CM | POA: Diagnosis not present

## 2016-04-26 DIAGNOSIS — Z9981 Dependence on supplemental oxygen: Secondary | ICD-10-CM | POA: Diagnosis not present

## 2016-04-26 DIAGNOSIS — E1159 Type 2 diabetes mellitus with other circulatory complications: Secondary | ICD-10-CM | POA: Diagnosis not present

## 2016-04-26 DIAGNOSIS — I890 Lymphedema, not elsewhere classified: Secondary | ICD-10-CM | POA: Diagnosis not present

## 2016-04-26 DIAGNOSIS — Z4800 Encounter for change or removal of nonsurgical wound dressing: Secondary | ICD-10-CM | POA: Diagnosis not present

## 2016-04-26 DIAGNOSIS — Z7984 Long term (current) use of oral hypoglycemic drugs: Secondary | ICD-10-CM | POA: Diagnosis not present

## 2016-04-26 DIAGNOSIS — Z7902 Long term (current) use of antithrombotics/antiplatelets: Secondary | ICD-10-CM | POA: Diagnosis not present

## 2016-04-26 DIAGNOSIS — L97222 Non-pressure chronic ulcer of left calf with fat layer exposed: Secondary | ICD-10-CM | POA: Diagnosis not present

## 2016-04-26 DIAGNOSIS — E11622 Type 2 diabetes mellitus with other skin ulcer: Secondary | ICD-10-CM | POA: Diagnosis not present

## 2016-04-28 ENCOUNTER — Other Ambulatory Visit: Payer: Self-pay | Admitting: *Deleted

## 2016-04-28 DIAGNOSIS — E11622 Type 2 diabetes mellitus with other skin ulcer: Secondary | ICD-10-CM | POA: Diagnosis not present

## 2016-04-28 DIAGNOSIS — E1159 Type 2 diabetes mellitus with other circulatory complications: Secondary | ICD-10-CM | POA: Diagnosis not present

## 2016-04-28 DIAGNOSIS — I890 Lymphedema, not elsewhere classified: Secondary | ICD-10-CM | POA: Diagnosis not present

## 2016-04-28 DIAGNOSIS — Z7982 Long term (current) use of aspirin: Secondary | ICD-10-CM | POA: Diagnosis not present

## 2016-04-28 DIAGNOSIS — I87332 Chronic venous hypertension (idiopathic) with ulcer and inflammation of left lower extremity: Secondary | ICD-10-CM | POA: Diagnosis not present

## 2016-04-28 DIAGNOSIS — Z4800 Encounter for change or removal of nonsurgical wound dressing: Secondary | ICD-10-CM | POA: Diagnosis not present

## 2016-04-28 DIAGNOSIS — Z9981 Dependence on supplemental oxygen: Secondary | ICD-10-CM | POA: Diagnosis not present

## 2016-04-28 DIAGNOSIS — Z7902 Long term (current) use of antithrombotics/antiplatelets: Secondary | ICD-10-CM | POA: Diagnosis not present

## 2016-04-28 DIAGNOSIS — L03119 Cellulitis of unspecified part of limb: Secondary | ICD-10-CM | POA: Diagnosis not present

## 2016-04-28 DIAGNOSIS — E119 Type 2 diabetes mellitus without complications: Secondary | ICD-10-CM | POA: Diagnosis not present

## 2016-04-28 DIAGNOSIS — L97222 Non-pressure chronic ulcer of left calf with fat layer exposed: Secondary | ICD-10-CM | POA: Diagnosis not present

## 2016-04-28 DIAGNOSIS — I509 Heart failure, unspecified: Secondary | ICD-10-CM | POA: Diagnosis not present

## 2016-04-28 DIAGNOSIS — I5022 Chronic systolic (congestive) heart failure: Secondary | ICD-10-CM | POA: Diagnosis not present

## 2016-04-28 DIAGNOSIS — I10 Essential (primary) hypertension: Secondary | ICD-10-CM | POA: Diagnosis not present

## 2016-04-28 DIAGNOSIS — Z7984 Long term (current) use of oral hypoglycemic drugs: Secondary | ICD-10-CM | POA: Diagnosis not present

## 2016-04-28 NOTE — Patient Outreach (Signed)
[facility] Care Management [facility] Community CM Telephone Outreach, Transition of Care day 9 04/28/2016  Marisa HuaGordon R [name] 25-Jul-1951 [id number]  Successful telephone Outreach to Ernest MoutonGordon [name], 65 y/o male, [facility] CM for transition of care after recent IP hospital visit June 10-13, 2017 for shortness of breath and leg swelling with cellulitis, associated with chronic CHF; patient has co-morbidity of morbid obesity and OSA and is O2 dependent.  [name] reports that he is "feeling good," and states that he attended his PCP visit earlier today.  [name] states that the visit went well, and that he has an appointment tomorrow with his kidney doctor, which he plans on attending.  [name] reports that his family provides transportation to provider appointments.  [name] states that he has not obtained a scale yet, stating that he "hopes to get one tomorrow."  Patient states his weight today at his provider visit was 332 pounds.  [name] said he "could hardly stand on the scales, it was like my legs couldn't hold me up."  Upon further questioning, patient clarified that he believed he "walked too much getting to the doctor's office" which he reported as his baseline, stating that has "needed to have hip surgery for a long time now."   [name] [facility] and is scheduled to return tomorrow around his kidney MD appointment.  Patient reports taking his medications as they are prescribed and denies questions about his medications.  I encouraged [name] to obtain a scale as soon as possible, and let him know that Ernest Mourningose [name], [facility] RN CM would be following up with him for continued transition of care next week.    Plan:  Will communicate successful telephone outreach to [place], [facility] RN CM, for continued transition of care outreach  Ernest PinaLaine Mckinney Tousey, RN, BSN, [employer] Al[employer] Community Care Coordinator [facility]  Care Management  [phone]

## 2016-04-29 DIAGNOSIS — L97222 Non-pressure chronic ulcer of left calf with fat layer exposed: Secondary | ICD-10-CM | POA: Diagnosis not present

## 2016-04-29 DIAGNOSIS — R600 Localized edema: Secondary | ICD-10-CM | POA: Diagnosis not present

## 2016-04-29 DIAGNOSIS — Z7902 Long term (current) use of antithrombotics/antiplatelets: Secondary | ICD-10-CM | POA: Diagnosis not present

## 2016-04-29 DIAGNOSIS — Z7984 Long term (current) use of oral hypoglycemic drugs: Secondary | ICD-10-CM | POA: Diagnosis not present

## 2016-04-29 DIAGNOSIS — I890 Lymphedema, not elsewhere classified: Secondary | ICD-10-CM | POA: Diagnosis not present

## 2016-04-29 DIAGNOSIS — Z4800 Encounter for change or removal of nonsurgical wound dressing: Secondary | ICD-10-CM | POA: Diagnosis not present

## 2016-04-29 DIAGNOSIS — Z9981 Dependence on supplemental oxygen: Secondary | ICD-10-CM | POA: Diagnosis not present

## 2016-04-29 DIAGNOSIS — I10 Essential (primary) hypertension: Secondary | ICD-10-CM | POA: Diagnosis not present

## 2016-04-29 DIAGNOSIS — Z7982 Long term (current) use of aspirin: Secondary | ICD-10-CM | POA: Diagnosis not present

## 2016-04-29 DIAGNOSIS — E11622 Type 2 diabetes mellitus with other skin ulcer: Secondary | ICD-10-CM | POA: Diagnosis not present

## 2016-04-29 DIAGNOSIS — E1159 Type 2 diabetes mellitus with other circulatory complications: Secondary | ICD-10-CM | POA: Diagnosis not present

## 2016-04-29 DIAGNOSIS — I5022 Chronic systolic (congestive) heart failure: Secondary | ICD-10-CM | POA: Diagnosis not present

## 2016-04-29 DIAGNOSIS — N179 Acute kidney failure, unspecified: Secondary | ICD-10-CM | POA: Diagnosis not present

## 2016-04-29 DIAGNOSIS — I87332 Chronic venous hypertension (idiopathic) with ulcer and inflammation of left lower extremity: Secondary | ICD-10-CM | POA: Diagnosis not present

## 2016-04-30 DIAGNOSIS — I5022 Chronic systolic (congestive) heart failure: Secondary | ICD-10-CM | POA: Diagnosis not present

## 2016-04-30 DIAGNOSIS — Z7902 Long term (current) use of antithrombotics/antiplatelets: Secondary | ICD-10-CM | POA: Diagnosis not present

## 2016-04-30 DIAGNOSIS — Z7984 Long term (current) use of oral hypoglycemic drugs: Secondary | ICD-10-CM | POA: Diagnosis not present

## 2016-04-30 DIAGNOSIS — I87332 Chronic venous hypertension (idiopathic) with ulcer and inflammation of left lower extremity: Secondary | ICD-10-CM | POA: Diagnosis not present

## 2016-04-30 DIAGNOSIS — E11622 Type 2 diabetes mellitus with other skin ulcer: Secondary | ICD-10-CM | POA: Diagnosis not present

## 2016-04-30 DIAGNOSIS — E1159 Type 2 diabetes mellitus with other circulatory complications: Secondary | ICD-10-CM | POA: Diagnosis not present

## 2016-04-30 DIAGNOSIS — I890 Lymphedema, not elsewhere classified: Secondary | ICD-10-CM | POA: Diagnosis not present

## 2016-04-30 DIAGNOSIS — Z4800 Encounter for change or removal of nonsurgical wound dressing: Secondary | ICD-10-CM | POA: Diagnosis not present

## 2016-04-30 DIAGNOSIS — L97222 Non-pressure chronic ulcer of left calf with fat layer exposed: Secondary | ICD-10-CM | POA: Diagnosis not present

## 2016-04-30 DIAGNOSIS — Z7982 Long term (current) use of aspirin: Secondary | ICD-10-CM | POA: Diagnosis not present

## 2016-04-30 DIAGNOSIS — Z9981 Dependence on supplemental oxygen: Secondary | ICD-10-CM | POA: Diagnosis not present

## 2016-05-03 DIAGNOSIS — Z7982 Long term (current) use of aspirin: Secondary | ICD-10-CM | POA: Diagnosis not present

## 2016-05-03 DIAGNOSIS — L97222 Non-pressure chronic ulcer of left calf with fat layer exposed: Secondary | ICD-10-CM | POA: Diagnosis not present

## 2016-05-03 DIAGNOSIS — I890 Lymphedema, not elsewhere classified: Secondary | ICD-10-CM | POA: Diagnosis not present

## 2016-05-03 DIAGNOSIS — Z7984 Long term (current) use of oral hypoglycemic drugs: Secondary | ICD-10-CM | POA: Diagnosis not present

## 2016-05-03 DIAGNOSIS — E11622 Type 2 diabetes mellitus with other skin ulcer: Secondary | ICD-10-CM | POA: Diagnosis not present

## 2016-05-03 DIAGNOSIS — Z7902 Long term (current) use of antithrombotics/antiplatelets: Secondary | ICD-10-CM | POA: Diagnosis not present

## 2016-05-03 DIAGNOSIS — I5022 Chronic systolic (congestive) heart failure: Secondary | ICD-10-CM | POA: Diagnosis not present

## 2016-05-03 DIAGNOSIS — I87332 Chronic venous hypertension (idiopathic) with ulcer and inflammation of left lower extremity: Secondary | ICD-10-CM | POA: Diagnosis not present

## 2016-05-03 DIAGNOSIS — E1159 Type 2 diabetes mellitus with other circulatory complications: Secondary | ICD-10-CM | POA: Diagnosis not present

## 2016-05-03 DIAGNOSIS — Z4800 Encounter for change or removal of nonsurgical wound dressing: Secondary | ICD-10-CM | POA: Diagnosis not present

## 2016-05-03 DIAGNOSIS — Z9981 Dependence on supplemental oxygen: Secondary | ICD-10-CM | POA: Diagnosis not present

## 2016-05-04 DIAGNOSIS — I5022 Chronic systolic (congestive) heart failure: Secondary | ICD-10-CM | POA: Diagnosis not present

## 2016-05-04 DIAGNOSIS — Z9981 Dependence on supplemental oxygen: Secondary | ICD-10-CM | POA: Diagnosis not present

## 2016-05-04 DIAGNOSIS — E11622 Type 2 diabetes mellitus with other skin ulcer: Secondary | ICD-10-CM | POA: Diagnosis not present

## 2016-05-04 DIAGNOSIS — Z7984 Long term (current) use of oral hypoglycemic drugs: Secondary | ICD-10-CM | POA: Diagnosis not present

## 2016-05-04 DIAGNOSIS — Z4800 Encounter for change or removal of nonsurgical wound dressing: Secondary | ICD-10-CM | POA: Diagnosis not present

## 2016-05-04 DIAGNOSIS — Z7902 Long term (current) use of antithrombotics/antiplatelets: Secondary | ICD-10-CM | POA: Diagnosis not present

## 2016-05-04 DIAGNOSIS — I890 Lymphedema, not elsewhere classified: Secondary | ICD-10-CM | POA: Diagnosis not present

## 2016-05-04 DIAGNOSIS — L97222 Non-pressure chronic ulcer of left calf with fat layer exposed: Secondary | ICD-10-CM | POA: Diagnosis not present

## 2016-05-04 DIAGNOSIS — Z7982 Long term (current) use of aspirin: Secondary | ICD-10-CM | POA: Diagnosis not present

## 2016-05-04 DIAGNOSIS — I87332 Chronic venous hypertension (idiopathic) with ulcer and inflammation of left lower extremity: Secondary | ICD-10-CM | POA: Diagnosis not present

## 2016-05-04 DIAGNOSIS — E1159 Type 2 diabetes mellitus with other circulatory complications: Secondary | ICD-10-CM | POA: Diagnosis not present

## 2016-05-05 DIAGNOSIS — I5022 Chronic systolic (congestive) heart failure: Secondary | ICD-10-CM | POA: Diagnosis not present

## 2016-05-05 DIAGNOSIS — I87332 Chronic venous hypertension (idiopathic) with ulcer and inflammation of left lower extremity: Secondary | ICD-10-CM | POA: Diagnosis not present

## 2016-05-05 DIAGNOSIS — E11622 Type 2 diabetes mellitus with other skin ulcer: Secondary | ICD-10-CM | POA: Diagnosis not present

## 2016-05-05 DIAGNOSIS — Z7982 Long term (current) use of aspirin: Secondary | ICD-10-CM | POA: Diagnosis not present

## 2016-05-05 DIAGNOSIS — Z4800 Encounter for change or removal of nonsurgical wound dressing: Secondary | ICD-10-CM | POA: Diagnosis not present

## 2016-05-05 DIAGNOSIS — Z9981 Dependence on supplemental oxygen: Secondary | ICD-10-CM | POA: Diagnosis not present

## 2016-05-05 DIAGNOSIS — E1159 Type 2 diabetes mellitus with other circulatory complications: Secondary | ICD-10-CM | POA: Diagnosis not present

## 2016-05-05 DIAGNOSIS — L97222 Non-pressure chronic ulcer of left calf with fat layer exposed: Secondary | ICD-10-CM | POA: Diagnosis not present

## 2016-05-05 DIAGNOSIS — Z7984 Long term (current) use of oral hypoglycemic drugs: Secondary | ICD-10-CM | POA: Diagnosis not present

## 2016-05-05 DIAGNOSIS — I890 Lymphedema, not elsewhere classified: Secondary | ICD-10-CM | POA: Diagnosis not present

## 2016-05-05 DIAGNOSIS — Z7902 Long term (current) use of antithrombotics/antiplatelets: Secondary | ICD-10-CM | POA: Diagnosis not present

## 2016-05-06 ENCOUNTER — Encounter: Payer: Self-pay | Admitting: *Deleted

## 2016-05-06 ENCOUNTER — Other Ambulatory Visit: Payer: Self-pay | Admitting: *Deleted

## 2016-05-06 DIAGNOSIS — Z4800 Encounter for change or removal of nonsurgical wound dressing: Secondary | ICD-10-CM | POA: Diagnosis not present

## 2016-05-06 DIAGNOSIS — I5022 Chronic systolic (congestive) heart failure: Secondary | ICD-10-CM | POA: Diagnosis not present

## 2016-05-06 DIAGNOSIS — L97222 Non-pressure chronic ulcer of left calf with fat layer exposed: Secondary | ICD-10-CM | POA: Diagnosis not present

## 2016-05-06 DIAGNOSIS — Z7902 Long term (current) use of antithrombotics/antiplatelets: Secondary | ICD-10-CM | POA: Diagnosis not present

## 2016-05-06 DIAGNOSIS — Z7982 Long term (current) use of aspirin: Secondary | ICD-10-CM | POA: Diagnosis not present

## 2016-05-06 DIAGNOSIS — I87332 Chronic venous hypertension (idiopathic) with ulcer and inflammation of left lower extremity: Secondary | ICD-10-CM | POA: Diagnosis not present

## 2016-05-06 DIAGNOSIS — Z9981 Dependence on supplemental oxygen: Secondary | ICD-10-CM | POA: Diagnosis not present

## 2016-05-06 DIAGNOSIS — E1159 Type 2 diabetes mellitus with other circulatory complications: Secondary | ICD-10-CM | POA: Diagnosis not present

## 2016-05-06 DIAGNOSIS — E11622 Type 2 diabetes mellitus with other skin ulcer: Secondary | ICD-10-CM | POA: Diagnosis not present

## 2016-05-06 DIAGNOSIS — Z7984 Long term (current) use of oral hypoglycemic drugs: Secondary | ICD-10-CM | POA: Diagnosis not present

## 2016-05-06 DIAGNOSIS — I890 Lymphedema, not elsewhere classified: Secondary | ICD-10-CM | POA: Diagnosis not present

## 2016-05-06 NOTE — Patient Outreach (Signed)
[facility]) Care Management   05/06/2016  [name] 1950-11-09 [id number]  [name] is an 65 y.o. male  Subjective:  Pt reports right leg sore today, gets that way after [facility] PT session, knows he needs the   therapy.   Pt reports he needs right hip surgery, cancelled recent  appointment with MD (check status of right hip) because HH PT was coming.  Pt reports he was told needed to loose weight before he can be a candidate for surgery.  Pt reports he needs a new walker, one he has is not steady. Pt reports he talked to someone  In the hospital who is suppose to come out about putting a ramp in, get him a walker.   Pt reports HH RN came today, drew blood, did not get a scale yet.   Pt reports his sugars range 111-135.     Objective:   Filed Vitals:   05/06/16 1420  BP: 138/90  Pulse: 73  Resp: 20    ROS  Physical Exam  Constitutional: He is oriented to person, place, and time. He appears well-developed and well-nourished.  Cardiovascular: Normal rate, regular rhythm and normal heart sounds.   Respiratory: Effort normal and breath sounds normal.  On O2 L [place]   GI: Soft. Bowel sounds are normal. He exhibits distension.  Distension from weight.   Musculoskeletal: He exhibits edema.  2-3 + edema in bilateral lower extremities, feet  Stayed in wheelchair during home visit   Neurological: He is alert and oriented to person, place, and time.  Skin: Skin is warm and dry.  Psychiatric: He has a normal mood and affect. His behavior is normal. Judgment and thought content normal.    Encounter Medications:  Reviewed with pt.  Outpatient Encounter Prescriptions as of 05/06/2016  Medication Sig Note  . aspirin EC 81 MG tablet Take 81 mg by mouth daily.   . celecoxib (CELEBREX) 200 MG capsule Take 200 mg by mouth daily.   . clopidogrel (PLAVIX) 75 MG tablet Take 75 mg by mouth daily.   Marland Kitchen dexlansoprazole (DEXILANT) 60 MG capsule Take 60 mg by mouth daily.   .  furosemide (LASIX) 40 MG tablet Take 40 mg by mouth 2 (two) times daily.    Marland Kitchen glipiZIDE (GLUCOTROL XL) 5 MG 24 hr tablet Take 5 mg by mouth 2 (two) times daily.   . Liraglutide (VICTOZA) 18 MG/3ML SOPN Inject 1.8 mg into the skin daily.   . nebivolol (BYSTOLIC) 10 MG tablet Take 10 mg by mouth every morning. *Note dose*   . Nebivolol HCl (BYSTOLIC) 20 MG TABS Take 20 mg by mouth at bedtime. *note dose*   . ondansetron (ZOFRAN ODT) 4 MG disintegrating tablet Take 1 tablet (4 mg total) by mouth every 8 (eight) hours as needed for nausea or vomiting. 05/06/2016: Taking as needed.   . OXYGEN Inhale 2 L into the lungs continuous.   . polyethylene glycol powder (GLYCOLAX/MIRALAX) powder Dissolve one heaping tablespoon in 4-8 ounces of juice or water and drink once daily.   . rosuvastatin (CRESTOR) 10 MG tablet Take 10 mg by mouth daily. Reported on 04/22/2016   . tamsulosin (FLOMAX) 0.4 MG CAPS capsule Take 0.4 mg by mouth daily.   Marland Kitchen telmisartan (MICARDIS) 80 MG tablet Take 80 mg by mouth daily.   . Vitamin D, Ergocalciferol, (DRISDOL) 50000 units CAPS capsule Take 50,000 Units by mouth every 7 (seven) days.   Marland Kitchen amoxicillin-clavulanate (AUGMENTIN) 875-125 MG tablet  Take 1 tablet by mouth 2 (two) times daily. (Patient not taking: Reported on 05/06/2016)   . oxyCODONE (ROXICODONE) 5 MG immediate release tablet Take 1 tablet (5 mg total) by mouth every 6 (six) hours as needed for moderate pain. Do not drive while taking this medication. (Patient not taking: Reported on 05/06/2016)    No facility-administered encounter medications on file as of 05/06/2016.    Functional Status:   In your present state of health, do you have any difficulty performing the following activities: 05/06/2016 04/17/2016  Hearing? N N  Vision? Y N  Difficulty concentrating or making decisions? N N  Walking or climbing stairs? Y Y  Dressing or bathing? Y Y  Doing errands, shopping? Y N  Preparing Food and eating ? N -  Using the  Toilet? N -  In the past six months, have you accidently leaked urine? N -  Do you have problems with loss of bowel control? N -  Managing your Medications? N -  Managing your Finances? N -  Housekeeping or managing your Housekeeping? N -    Fall/Depression Screening:    PHQ 2/9 Scores 05/06/2016  PHQ - 2 Score 0    Assessment:  Pleasant 65 year old gentleman, upon arrival- pt sitting on his porch in wheelchair.                          Lungs clear, 2-3+ edema in bilateral lower extremities/feet (right leg dark red).  Pt reports                         Current swelling status is good for him.   2L [place] on- O2 sat at rest 97%. No c/o sob,                         chest pain.  Pt c/o pain in right lower leg (+9 on pain score)- sore from                           [facility] PT session today.                            Plan:   As discussed, pt to purchase a scale, start weighing,record, call MD for weight gain.              As discussed, pt to f/u about having right hip assessed ( told needs hip surgery)              As discussed, pt to call Primary Care MD, request prescription be sent to local medical                 Supply store for new walker.              Plan to fax Dr. Lavera Guise 6/29 home visit encounter.              Plan to continue to follow pt for transition of care, f/u again telephonically 7/7.                             THN CM Care Plan Problem One        Most Recent Value   Care Plan Problem One  Risk for  readmission related to recent hospitalization for lower extremity edema, RLE cellulitis    Role Documenting the Problem One  Care Management Coordinator   Care Plan for Problem One  Active   THN Long Term Goal (31-90 days)  Pt would not readmit 31 days post day of discharge    THN Long Term Goal Start Date  04/22/16   Interventions for Problem One Long Term Goal  Home visit done- part of transition of care, information on HF provided    THN CM Short Term Goal #1 (0-30 days)  Pt  cellulitis would resolve in the next 14 days    THN CM Short Term Goal #1 Start Date  05/06/16   [facility] CM Short Term Goal #1 Met Date  -- [swelling still present- re established ]   Interventions for Short Term Goal #1  Reinforced with pt to elevate feet as much as possible,pain medication as needed.     THN CM Care Plan Problem Two        Most Recent Value   Care Plan Problem Two  Better management of HF- not weighing    Role Documenting the Problem Two  Care Management Coordinator   Care Plan for Problem Two  Active   THN CM Short Term Goal #1 (0-30 days)  Pt would get scale, start weighing in the next 7-10 days    THN CM Short Term Goal #1 Start Date  05/06/16 [not met- re established ]   Interventions for Short Term Goal #2   Reinforced need to purchase scale, weigh daily, record for HF but also for desired weight loss      Rose M.   [place] Care Management  [phone]

## 2016-05-10 DIAGNOSIS — Z9981 Dependence on supplemental oxygen: Secondary | ICD-10-CM | POA: Diagnosis not present

## 2016-05-10 DIAGNOSIS — Z4800 Encounter for change or removal of nonsurgical wound dressing: Secondary | ICD-10-CM | POA: Diagnosis not present

## 2016-05-10 DIAGNOSIS — R5383 Other fatigue: Secondary | ICD-10-CM | POA: Diagnosis not present

## 2016-05-10 DIAGNOSIS — E1165 Type 2 diabetes mellitus with hyperglycemia: Secondary | ICD-10-CM | POA: Diagnosis not present

## 2016-05-10 DIAGNOSIS — E559 Vitamin D deficiency, unspecified: Secondary | ICD-10-CM | POA: Diagnosis not present

## 2016-05-10 DIAGNOSIS — Z7984 Long term (current) use of oral hypoglycemic drugs: Secondary | ICD-10-CM | POA: Diagnosis not present

## 2016-05-10 DIAGNOSIS — Z7902 Long term (current) use of antithrombotics/antiplatelets: Secondary | ICD-10-CM | POA: Diagnosis not present

## 2016-05-10 DIAGNOSIS — I890 Lymphedema, not elsewhere classified: Secondary | ICD-10-CM | POA: Diagnosis not present

## 2016-05-10 DIAGNOSIS — L97222 Non-pressure chronic ulcer of left calf with fat layer exposed: Secondary | ICD-10-CM | POA: Diagnosis not present

## 2016-05-10 DIAGNOSIS — Z7982 Long term (current) use of aspirin: Secondary | ICD-10-CM | POA: Diagnosis not present

## 2016-05-10 DIAGNOSIS — I87332 Chronic venous hypertension (idiopathic) with ulcer and inflammation of left lower extremity: Secondary | ICD-10-CM | POA: Diagnosis not present

## 2016-05-10 DIAGNOSIS — E1159 Type 2 diabetes mellitus with other circulatory complications: Secondary | ICD-10-CM | POA: Diagnosis not present

## 2016-05-10 DIAGNOSIS — I5022 Chronic systolic (congestive) heart failure: Secondary | ICD-10-CM | POA: Diagnosis not present

## 2016-05-10 DIAGNOSIS — E11622 Type 2 diabetes mellitus with other skin ulcer: Secondary | ICD-10-CM | POA: Diagnosis not present

## 2016-05-11 DIAGNOSIS — I509 Heart failure, unspecified: Secondary | ICD-10-CM | POA: Diagnosis not present

## 2016-05-12 DIAGNOSIS — I87332 Chronic venous hypertension (idiopathic) with ulcer and inflammation of left lower extremity: Secondary | ICD-10-CM | POA: Diagnosis not present

## 2016-05-12 DIAGNOSIS — I890 Lymphedema, not elsewhere classified: Secondary | ICD-10-CM | POA: Diagnosis not present

## 2016-05-12 DIAGNOSIS — Z7984 Long term (current) use of oral hypoglycemic drugs: Secondary | ICD-10-CM | POA: Diagnosis not present

## 2016-05-12 DIAGNOSIS — Z4800 Encounter for change or removal of nonsurgical wound dressing: Secondary | ICD-10-CM | POA: Diagnosis not present

## 2016-05-12 DIAGNOSIS — I5022 Chronic systolic (congestive) heart failure: Secondary | ICD-10-CM | POA: Diagnosis not present

## 2016-05-12 DIAGNOSIS — L97222 Non-pressure chronic ulcer of left calf with fat layer exposed: Secondary | ICD-10-CM | POA: Diagnosis not present

## 2016-05-12 DIAGNOSIS — Z7982 Long term (current) use of aspirin: Secondary | ICD-10-CM | POA: Diagnosis not present

## 2016-05-12 DIAGNOSIS — Z7902 Long term (current) use of antithrombotics/antiplatelets: Secondary | ICD-10-CM | POA: Diagnosis not present

## 2016-05-12 DIAGNOSIS — E11622 Type 2 diabetes mellitus with other skin ulcer: Secondary | ICD-10-CM | POA: Diagnosis not present

## 2016-05-12 DIAGNOSIS — Z9981 Dependence on supplemental oxygen: Secondary | ICD-10-CM | POA: Diagnosis not present

## 2016-05-12 DIAGNOSIS — E1159 Type 2 diabetes mellitus with other circulatory complications: Secondary | ICD-10-CM | POA: Diagnosis not present

## 2016-05-13 DIAGNOSIS — Z9981 Dependence on supplemental oxygen: Secondary | ICD-10-CM | POA: Diagnosis not present

## 2016-05-13 DIAGNOSIS — E1159 Type 2 diabetes mellitus with other circulatory complications: Secondary | ICD-10-CM | POA: Diagnosis not present

## 2016-05-13 DIAGNOSIS — I87332 Chronic venous hypertension (idiopathic) with ulcer and inflammation of left lower extremity: Secondary | ICD-10-CM | POA: Diagnosis not present

## 2016-05-13 DIAGNOSIS — E11622 Type 2 diabetes mellitus with other skin ulcer: Secondary | ICD-10-CM | POA: Diagnosis not present

## 2016-05-13 DIAGNOSIS — Z7982 Long term (current) use of aspirin: Secondary | ICD-10-CM | POA: Diagnosis not present

## 2016-05-13 DIAGNOSIS — Z7984 Long term (current) use of oral hypoglycemic drugs: Secondary | ICD-10-CM | POA: Diagnosis not present

## 2016-05-13 DIAGNOSIS — I5022 Chronic systolic (congestive) heart failure: Secondary | ICD-10-CM | POA: Diagnosis not present

## 2016-05-13 DIAGNOSIS — I890 Lymphedema, not elsewhere classified: Secondary | ICD-10-CM | POA: Diagnosis not present

## 2016-05-13 DIAGNOSIS — L97222 Non-pressure chronic ulcer of left calf with fat layer exposed: Secondary | ICD-10-CM | POA: Diagnosis not present

## 2016-05-13 DIAGNOSIS — Z4800 Encounter for change or removal of nonsurgical wound dressing: Secondary | ICD-10-CM | POA: Diagnosis not present

## 2016-05-13 DIAGNOSIS — Z7902 Long term (current) use of antithrombotics/antiplatelets: Secondary | ICD-10-CM | POA: Diagnosis not present

## 2016-05-14 ENCOUNTER — Other Ambulatory Visit: Payer: Self-pay | Admitting: *Deleted

## 2016-05-14 NOTE — Patient Outreach (Signed)
Transition of care call successful (week 4, recent inpatient admission 6/10-6/13 HF, cellulitis right lower leg, leg edema).   Spoke with pt, HIPAA verified.   Pt reports on right lower leg, has a little redness still, edema going down in both legs.  Pt reports currently no pain.  Pt reports did not get prescription from MD yet for new walker.  Pt reports did get a scale but it was not big enough, pharmacy is going to order one.   [facility].   Pt reports spoke with someone while inpatient who was going to f/u on getting him a ramp built, contact him  2-3 weeks post discharge but has not heard from that person yet.  RN CM discussed with pt if do not hear back, [facility] if needed.   Discussed with pt plan to follow up  again telephonically 7/14- final transition of care call.      Shayne Alkenose M.   Zanayah Shadowens RN CCM [facility] Care Management  [phone]

## 2016-05-17 DIAGNOSIS — Z9981 Dependence on supplemental oxygen: Secondary | ICD-10-CM | POA: Diagnosis not present

## 2016-05-17 DIAGNOSIS — L97222 Non-pressure chronic ulcer of left calf with fat layer exposed: Secondary | ICD-10-CM | POA: Diagnosis not present

## 2016-05-17 DIAGNOSIS — Z7984 Long term (current) use of oral hypoglycemic drugs: Secondary | ICD-10-CM | POA: Diagnosis not present

## 2016-05-17 DIAGNOSIS — Z7982 Long term (current) use of aspirin: Secondary | ICD-10-CM | POA: Diagnosis not present

## 2016-05-17 DIAGNOSIS — Z7902 Long term (current) use of antithrombotics/antiplatelets: Secondary | ICD-10-CM | POA: Diagnosis not present

## 2016-05-17 DIAGNOSIS — I87332 Chronic venous hypertension (idiopathic) with ulcer and inflammation of left lower extremity: Secondary | ICD-10-CM | POA: Diagnosis not present

## 2016-05-17 DIAGNOSIS — E11622 Type 2 diabetes mellitus with other skin ulcer: Secondary | ICD-10-CM | POA: Diagnosis not present

## 2016-05-17 DIAGNOSIS — I890 Lymphedema, not elsewhere classified: Secondary | ICD-10-CM | POA: Diagnosis not present

## 2016-05-17 DIAGNOSIS — Z4800 Encounter for change or removal of nonsurgical wound dressing: Secondary | ICD-10-CM | POA: Diagnosis not present

## 2016-05-17 DIAGNOSIS — E1159 Type 2 diabetes mellitus with other circulatory complications: Secondary | ICD-10-CM | POA: Diagnosis not present

## 2016-05-17 DIAGNOSIS — I5022 Chronic systolic (congestive) heart failure: Secondary | ICD-10-CM | POA: Diagnosis not present

## 2016-05-18 DIAGNOSIS — Z7984 Long term (current) use of oral hypoglycemic drugs: Secondary | ICD-10-CM | POA: Diagnosis not present

## 2016-05-18 DIAGNOSIS — L97222 Non-pressure chronic ulcer of left calf with fat layer exposed: Secondary | ICD-10-CM | POA: Diagnosis not present

## 2016-05-18 DIAGNOSIS — I87332 Chronic venous hypertension (idiopathic) with ulcer and inflammation of left lower extremity: Secondary | ICD-10-CM | POA: Diagnosis not present

## 2016-05-18 DIAGNOSIS — I890 Lymphedema, not elsewhere classified: Secondary | ICD-10-CM | POA: Diagnosis not present

## 2016-05-18 DIAGNOSIS — E1159 Type 2 diabetes mellitus with other circulatory complications: Secondary | ICD-10-CM | POA: Diagnosis not present

## 2016-05-18 DIAGNOSIS — Z4800 Encounter for change or removal of nonsurgical wound dressing: Secondary | ICD-10-CM | POA: Diagnosis not present

## 2016-05-18 DIAGNOSIS — I5022 Chronic systolic (congestive) heart failure: Secondary | ICD-10-CM | POA: Diagnosis not present

## 2016-05-18 DIAGNOSIS — Z7902 Long term (current) use of antithrombotics/antiplatelets: Secondary | ICD-10-CM | POA: Diagnosis not present

## 2016-05-18 DIAGNOSIS — Z7982 Long term (current) use of aspirin: Secondary | ICD-10-CM | POA: Diagnosis not present

## 2016-05-18 DIAGNOSIS — Z9981 Dependence on supplemental oxygen: Secondary | ICD-10-CM | POA: Diagnosis not present

## 2016-05-18 DIAGNOSIS — E11622 Type 2 diabetes mellitus with other skin ulcer: Secondary | ICD-10-CM | POA: Diagnosis not present

## 2016-05-19 DIAGNOSIS — Z7982 Long term (current) use of aspirin: Secondary | ICD-10-CM | POA: Diagnosis not present

## 2016-05-19 DIAGNOSIS — L97222 Non-pressure chronic ulcer of left calf with fat layer exposed: Secondary | ICD-10-CM | POA: Diagnosis not present

## 2016-05-19 DIAGNOSIS — I890 Lymphedema, not elsewhere classified: Secondary | ICD-10-CM | POA: Diagnosis not present

## 2016-05-19 DIAGNOSIS — E1159 Type 2 diabetes mellitus with other circulatory complications: Secondary | ICD-10-CM | POA: Diagnosis not present

## 2016-05-19 DIAGNOSIS — Z7902 Long term (current) use of antithrombotics/antiplatelets: Secondary | ICD-10-CM | POA: Diagnosis not present

## 2016-05-19 DIAGNOSIS — Z7984 Long term (current) use of oral hypoglycemic drugs: Secondary | ICD-10-CM | POA: Diagnosis not present

## 2016-05-19 DIAGNOSIS — I87332 Chronic venous hypertension (idiopathic) with ulcer and inflammation of left lower extremity: Secondary | ICD-10-CM | POA: Diagnosis not present

## 2016-05-19 DIAGNOSIS — Z4800 Encounter for change or removal of nonsurgical wound dressing: Secondary | ICD-10-CM | POA: Diagnosis not present

## 2016-05-19 DIAGNOSIS — E11622 Type 2 diabetes mellitus with other skin ulcer: Secondary | ICD-10-CM | POA: Diagnosis not present

## 2016-05-19 DIAGNOSIS — I5022 Chronic systolic (congestive) heart failure: Secondary | ICD-10-CM | POA: Diagnosis not present

## 2016-05-19 DIAGNOSIS — Z9981 Dependence on supplemental oxygen: Secondary | ICD-10-CM | POA: Diagnosis not present

## 2016-05-20 DIAGNOSIS — I890 Lymphedema, not elsewhere classified: Secondary | ICD-10-CM | POA: Diagnosis not present

## 2016-05-20 DIAGNOSIS — Z4800 Encounter for change or removal of nonsurgical wound dressing: Secondary | ICD-10-CM | POA: Diagnosis not present

## 2016-05-20 DIAGNOSIS — E11622 Type 2 diabetes mellitus with other skin ulcer: Secondary | ICD-10-CM | POA: Diagnosis not present

## 2016-05-20 DIAGNOSIS — Z9981 Dependence on supplemental oxygen: Secondary | ICD-10-CM | POA: Diagnosis not present

## 2016-05-20 DIAGNOSIS — I5022 Chronic systolic (congestive) heart failure: Secondary | ICD-10-CM | POA: Diagnosis not present

## 2016-05-20 DIAGNOSIS — Z7982 Long term (current) use of aspirin: Secondary | ICD-10-CM | POA: Diagnosis not present

## 2016-05-20 DIAGNOSIS — Z7902 Long term (current) use of antithrombotics/antiplatelets: Secondary | ICD-10-CM | POA: Diagnosis not present

## 2016-05-20 DIAGNOSIS — Z7984 Long term (current) use of oral hypoglycemic drugs: Secondary | ICD-10-CM | POA: Diagnosis not present

## 2016-05-20 DIAGNOSIS — L97222 Non-pressure chronic ulcer of left calf with fat layer exposed: Secondary | ICD-10-CM | POA: Diagnosis not present

## 2016-05-20 DIAGNOSIS — I87332 Chronic venous hypertension (idiopathic) with ulcer and inflammation of left lower extremity: Secondary | ICD-10-CM | POA: Diagnosis not present

## 2016-05-20 DIAGNOSIS — E1159 Type 2 diabetes mellitus with other circulatory complications: Secondary | ICD-10-CM | POA: Diagnosis not present

## 2016-05-21 ENCOUNTER — Encounter: Payer: Self-pay | Admitting: *Deleted

## 2016-05-21 ENCOUNTER — Other Ambulatory Visit: Payer: Self-pay | Admitting: *Deleted

## 2016-05-21 NOTE — Patient Outreach (Signed)
Final transition of care call successful - pt's recent in patient stay 6/10-6/13 for cellulitis in right lower leg, leg edema, HF.  Spoke with pt, HIPAA verified.   Pt reports doing okay, been to see MD 7/10 about his oxygen.  Pt reports cellulitis is resolved, edema in legs down, breathing okay.  Pt reports still waiting on scale pharmacy ordered, should be getting soon. RN CM reviewed with pt weighing daily, record, call MD for weight gain of >2 lbs in a day, 5 lbs in a week.   Pt reports saw Heart MD- informed doing okay.  Pt reports has not heard back about ramp yet.   RN CM discussed continuing to provide community nurse case management services to which pt declined, [name], sees 2 or 3 doctors.     With this being final transition of care call, no further case management needs,plan to close case.  Plan to inform [name] of discharge from RN CM services- fax case closure letter.  Plan to inform [facility] care [facility] assistant to close case.    Ernest Alkenose M.   [name] RN CCM [facility] Care Management  [phone]

## 2016-05-24 DIAGNOSIS — Z7902 Long term (current) use of antithrombotics/antiplatelets: Secondary | ICD-10-CM | POA: Diagnosis not present

## 2016-05-24 DIAGNOSIS — L97222 Non-pressure chronic ulcer of left calf with fat layer exposed: Secondary | ICD-10-CM | POA: Diagnosis not present

## 2016-05-24 DIAGNOSIS — Z7982 Long term (current) use of aspirin: Secondary | ICD-10-CM | POA: Diagnosis not present

## 2016-05-24 DIAGNOSIS — I5022 Chronic systolic (congestive) heart failure: Secondary | ICD-10-CM | POA: Diagnosis not present

## 2016-05-24 DIAGNOSIS — Z7984 Long term (current) use of oral hypoglycemic drugs: Secondary | ICD-10-CM | POA: Diagnosis not present

## 2016-05-24 DIAGNOSIS — Z4800 Encounter for change or removal of nonsurgical wound dressing: Secondary | ICD-10-CM | POA: Diagnosis not present

## 2016-05-24 DIAGNOSIS — I87332 Chronic venous hypertension (idiopathic) with ulcer and inflammation of left lower extremity: Secondary | ICD-10-CM | POA: Diagnosis not present

## 2016-05-24 DIAGNOSIS — Z9981 Dependence on supplemental oxygen: Secondary | ICD-10-CM | POA: Diagnosis not present

## 2016-05-24 DIAGNOSIS — E1159 Type 2 diabetes mellitus with other circulatory complications: Secondary | ICD-10-CM | POA: Diagnosis not present

## 2016-05-24 DIAGNOSIS — E11622 Type 2 diabetes mellitus with other skin ulcer: Secondary | ICD-10-CM | POA: Diagnosis not present

## 2016-05-24 DIAGNOSIS — I890 Lymphedema, not elsewhere classified: Secondary | ICD-10-CM | POA: Diagnosis not present

## 2016-05-25 DIAGNOSIS — Z7982 Long term (current) use of aspirin: Secondary | ICD-10-CM | POA: Diagnosis not present

## 2016-05-25 DIAGNOSIS — I87332 Chronic venous hypertension (idiopathic) with ulcer and inflammation of left lower extremity: Secondary | ICD-10-CM | POA: Diagnosis not present

## 2016-05-25 DIAGNOSIS — L97222 Non-pressure chronic ulcer of left calf with fat layer exposed: Secondary | ICD-10-CM | POA: Diagnosis not present

## 2016-05-25 DIAGNOSIS — I890 Lymphedema, not elsewhere classified: Secondary | ICD-10-CM | POA: Diagnosis not present

## 2016-05-25 DIAGNOSIS — Z9981 Dependence on supplemental oxygen: Secondary | ICD-10-CM | POA: Diagnosis not present

## 2016-05-25 DIAGNOSIS — Z7902 Long term (current) use of antithrombotics/antiplatelets: Secondary | ICD-10-CM | POA: Diagnosis not present

## 2016-05-25 DIAGNOSIS — E11622 Type 2 diabetes mellitus with other skin ulcer: Secondary | ICD-10-CM | POA: Diagnosis not present

## 2016-05-25 DIAGNOSIS — E1159 Type 2 diabetes mellitus with other circulatory complications: Secondary | ICD-10-CM | POA: Diagnosis not present

## 2016-05-25 DIAGNOSIS — Z7984 Long term (current) use of oral hypoglycemic drugs: Secondary | ICD-10-CM | POA: Diagnosis not present

## 2016-05-25 DIAGNOSIS — Z4800 Encounter for change or removal of nonsurgical wound dressing: Secondary | ICD-10-CM | POA: Diagnosis not present

## 2016-05-25 DIAGNOSIS — I5022 Chronic systolic (congestive) heart failure: Secondary | ICD-10-CM | POA: Diagnosis not present

## 2016-05-26 DIAGNOSIS — Z7984 Long term (current) use of oral hypoglycemic drugs: Secondary | ICD-10-CM | POA: Diagnosis not present

## 2016-05-26 DIAGNOSIS — I5022 Chronic systolic (congestive) heart failure: Secondary | ICD-10-CM | POA: Diagnosis not present

## 2016-05-26 DIAGNOSIS — Z4800 Encounter for change or removal of nonsurgical wound dressing: Secondary | ICD-10-CM | POA: Diagnosis not present

## 2016-05-26 DIAGNOSIS — L97222 Non-pressure chronic ulcer of left calf with fat layer exposed: Secondary | ICD-10-CM | POA: Diagnosis not present

## 2016-05-26 DIAGNOSIS — Z7902 Long term (current) use of antithrombotics/antiplatelets: Secondary | ICD-10-CM | POA: Diagnosis not present

## 2016-05-26 DIAGNOSIS — Z9981 Dependence on supplemental oxygen: Secondary | ICD-10-CM | POA: Diagnosis not present

## 2016-05-26 DIAGNOSIS — Z7982 Long term (current) use of aspirin: Secondary | ICD-10-CM | POA: Diagnosis not present

## 2016-05-26 DIAGNOSIS — E1159 Type 2 diabetes mellitus with other circulatory complications: Secondary | ICD-10-CM | POA: Diagnosis not present

## 2016-05-26 DIAGNOSIS — I87332 Chronic venous hypertension (idiopathic) with ulcer and inflammation of left lower extremity: Secondary | ICD-10-CM | POA: Diagnosis not present

## 2016-05-26 DIAGNOSIS — E11622 Type 2 diabetes mellitus with other skin ulcer: Secondary | ICD-10-CM | POA: Diagnosis not present

## 2016-05-26 DIAGNOSIS — I890 Lymphedema, not elsewhere classified: Secondary | ICD-10-CM | POA: Diagnosis not present

## 2016-05-28 DIAGNOSIS — Z4800 Encounter for change or removal of nonsurgical wound dressing: Secondary | ICD-10-CM | POA: Diagnosis not present

## 2016-05-28 DIAGNOSIS — E11622 Type 2 diabetes mellitus with other skin ulcer: Secondary | ICD-10-CM | POA: Diagnosis not present

## 2016-05-28 DIAGNOSIS — I5022 Chronic systolic (congestive) heart failure: Secondary | ICD-10-CM | POA: Diagnosis not present

## 2016-05-28 DIAGNOSIS — I890 Lymphedema, not elsewhere classified: Secondary | ICD-10-CM | POA: Diagnosis not present

## 2016-05-28 DIAGNOSIS — Z9981 Dependence on supplemental oxygen: Secondary | ICD-10-CM | POA: Diagnosis not present

## 2016-05-28 DIAGNOSIS — Z7902 Long term (current) use of antithrombotics/antiplatelets: Secondary | ICD-10-CM | POA: Diagnosis not present

## 2016-05-28 DIAGNOSIS — I87332 Chronic venous hypertension (idiopathic) with ulcer and inflammation of left lower extremity: Secondary | ICD-10-CM | POA: Diagnosis not present

## 2016-05-28 DIAGNOSIS — Z7984 Long term (current) use of oral hypoglycemic drugs: Secondary | ICD-10-CM | POA: Diagnosis not present

## 2016-05-28 DIAGNOSIS — E1159 Type 2 diabetes mellitus with other circulatory complications: Secondary | ICD-10-CM | POA: Diagnosis not present

## 2016-05-28 DIAGNOSIS — L97222 Non-pressure chronic ulcer of left calf with fat layer exposed: Secondary | ICD-10-CM | POA: Diagnosis not present

## 2016-05-28 DIAGNOSIS — Z7982 Long term (current) use of aspirin: Secondary | ICD-10-CM | POA: Diagnosis not present

## 2016-05-31 DIAGNOSIS — I87332 Chronic venous hypertension (idiopathic) with ulcer and inflammation of left lower extremity: Secondary | ICD-10-CM | POA: Diagnosis not present

## 2016-05-31 DIAGNOSIS — L97222 Non-pressure chronic ulcer of left calf with fat layer exposed: Secondary | ICD-10-CM | POA: Diagnosis not present

## 2016-05-31 DIAGNOSIS — Z4800 Encounter for change or removal of nonsurgical wound dressing: Secondary | ICD-10-CM | POA: Diagnosis not present

## 2016-05-31 DIAGNOSIS — Z7902 Long term (current) use of antithrombotics/antiplatelets: Secondary | ICD-10-CM | POA: Diagnosis not present

## 2016-05-31 DIAGNOSIS — Z7982 Long term (current) use of aspirin: Secondary | ICD-10-CM | POA: Diagnosis not present

## 2016-05-31 DIAGNOSIS — Z9981 Dependence on supplemental oxygen: Secondary | ICD-10-CM | POA: Diagnosis not present

## 2016-05-31 DIAGNOSIS — I890 Lymphedema, not elsewhere classified: Secondary | ICD-10-CM | POA: Diagnosis not present

## 2016-05-31 DIAGNOSIS — Z7984 Long term (current) use of oral hypoglycemic drugs: Secondary | ICD-10-CM | POA: Diagnosis not present

## 2016-05-31 DIAGNOSIS — E1159 Type 2 diabetes mellitus with other circulatory complications: Secondary | ICD-10-CM | POA: Diagnosis not present

## 2016-05-31 DIAGNOSIS — I5022 Chronic systolic (congestive) heart failure: Secondary | ICD-10-CM | POA: Diagnosis not present

## 2016-05-31 DIAGNOSIS — E11622 Type 2 diabetes mellitus with other skin ulcer: Secondary | ICD-10-CM | POA: Diagnosis not present

## 2016-06-02 DIAGNOSIS — Z9981 Dependence on supplemental oxygen: Secondary | ICD-10-CM | POA: Diagnosis not present

## 2016-06-02 DIAGNOSIS — L97222 Non-pressure chronic ulcer of left calf with fat layer exposed: Secondary | ICD-10-CM | POA: Diagnosis not present

## 2016-06-02 DIAGNOSIS — I87332 Chronic venous hypertension (idiopathic) with ulcer and inflammation of left lower extremity: Secondary | ICD-10-CM | POA: Diagnosis not present

## 2016-06-02 DIAGNOSIS — Z4800 Encounter for change or removal of nonsurgical wound dressing: Secondary | ICD-10-CM | POA: Diagnosis not present

## 2016-06-02 DIAGNOSIS — I890 Lymphedema, not elsewhere classified: Secondary | ICD-10-CM | POA: Diagnosis not present

## 2016-06-02 DIAGNOSIS — I5022 Chronic systolic (congestive) heart failure: Secondary | ICD-10-CM | POA: Diagnosis not present

## 2016-06-02 DIAGNOSIS — E11622 Type 2 diabetes mellitus with other skin ulcer: Secondary | ICD-10-CM | POA: Diagnosis not present

## 2016-06-02 DIAGNOSIS — Z7982 Long term (current) use of aspirin: Secondary | ICD-10-CM | POA: Diagnosis not present

## 2016-06-02 DIAGNOSIS — Z7902 Long term (current) use of antithrombotics/antiplatelets: Secondary | ICD-10-CM | POA: Diagnosis not present

## 2016-06-02 DIAGNOSIS — E1159 Type 2 diabetes mellitus with other circulatory complications: Secondary | ICD-10-CM | POA: Diagnosis not present

## 2016-06-02 DIAGNOSIS — Z7984 Long term (current) use of oral hypoglycemic drugs: Secondary | ICD-10-CM | POA: Diagnosis not present

## 2016-06-03 DIAGNOSIS — Z7984 Long term (current) use of oral hypoglycemic drugs: Secondary | ICD-10-CM | POA: Diagnosis not present

## 2016-06-03 DIAGNOSIS — I5022 Chronic systolic (congestive) heart failure: Secondary | ICD-10-CM | POA: Diagnosis not present

## 2016-06-03 DIAGNOSIS — I890 Lymphedema, not elsewhere classified: Secondary | ICD-10-CM | POA: Diagnosis not present

## 2016-06-03 DIAGNOSIS — Z7982 Long term (current) use of aspirin: Secondary | ICD-10-CM | POA: Diagnosis not present

## 2016-06-03 DIAGNOSIS — Z4800 Encounter for change or removal of nonsurgical wound dressing: Secondary | ICD-10-CM | POA: Diagnosis not present

## 2016-06-03 DIAGNOSIS — E11622 Type 2 diabetes mellitus with other skin ulcer: Secondary | ICD-10-CM | POA: Diagnosis not present

## 2016-06-03 DIAGNOSIS — L97222 Non-pressure chronic ulcer of left calf with fat layer exposed: Secondary | ICD-10-CM | POA: Diagnosis not present

## 2016-06-03 DIAGNOSIS — Z9981 Dependence on supplemental oxygen: Secondary | ICD-10-CM | POA: Diagnosis not present

## 2016-06-03 DIAGNOSIS — Z7902 Long term (current) use of antithrombotics/antiplatelets: Secondary | ICD-10-CM | POA: Diagnosis not present

## 2016-06-03 DIAGNOSIS — I87332 Chronic venous hypertension (idiopathic) with ulcer and inflammation of left lower extremity: Secondary | ICD-10-CM | POA: Diagnosis not present

## 2016-06-03 DIAGNOSIS — E1159 Type 2 diabetes mellitus with other circulatory complications: Secondary | ICD-10-CM | POA: Diagnosis not present

## 2016-06-07 DIAGNOSIS — Z792 Long term (current) use of antibiotics: Secondary | ICD-10-CM | POA: Diagnosis not present

## 2016-06-07 DIAGNOSIS — N189 Chronic kidney disease, unspecified: Secondary | ICD-10-CM | POA: Diagnosis not present

## 2016-06-07 DIAGNOSIS — E1159 Type 2 diabetes mellitus with other circulatory complications: Secondary | ICD-10-CM | POA: Diagnosis not present

## 2016-06-07 DIAGNOSIS — I87302 Chronic venous hypertension (idiopathic) without complications of left lower extremity: Secondary | ICD-10-CM | POA: Diagnosis not present

## 2016-06-07 DIAGNOSIS — I890 Lymphedema, not elsewhere classified: Secondary | ICD-10-CM | POA: Diagnosis not present

## 2016-06-07 DIAGNOSIS — Z7902 Long term (current) use of antithrombotics/antiplatelets: Secondary | ICD-10-CM | POA: Diagnosis not present

## 2016-06-07 DIAGNOSIS — E1122 Type 2 diabetes mellitus with diabetic chronic kidney disease: Secondary | ICD-10-CM | POA: Diagnosis not present

## 2016-06-07 DIAGNOSIS — S81801D Unspecified open wound, right lower leg, subsequent encounter: Secondary | ICD-10-CM | POA: Diagnosis not present

## 2016-06-07 DIAGNOSIS — I5022 Chronic systolic (congestive) heart failure: Secondary | ICD-10-CM | POA: Diagnosis not present

## 2016-06-07 DIAGNOSIS — M25551 Pain in right hip: Secondary | ICD-10-CM | POA: Diagnosis not present

## 2016-06-07 DIAGNOSIS — J449 Chronic obstructive pulmonary disease, unspecified: Secondary | ICD-10-CM | POA: Diagnosis not present

## 2016-06-08 DIAGNOSIS — Z7902 Long term (current) use of antithrombotics/antiplatelets: Secondary | ICD-10-CM | POA: Diagnosis not present

## 2016-06-08 DIAGNOSIS — I87302 Chronic venous hypertension (idiopathic) without complications of left lower extremity: Secondary | ICD-10-CM | POA: Diagnosis not present

## 2016-06-08 DIAGNOSIS — E1122 Type 2 diabetes mellitus with diabetic chronic kidney disease: Secondary | ICD-10-CM | POA: Diagnosis not present

## 2016-06-08 DIAGNOSIS — E1159 Type 2 diabetes mellitus with other circulatory complications: Secondary | ICD-10-CM | POA: Diagnosis not present

## 2016-06-08 DIAGNOSIS — I5022 Chronic systolic (congestive) heart failure: Secondary | ICD-10-CM | POA: Diagnosis not present

## 2016-06-08 DIAGNOSIS — S81801D Unspecified open wound, right lower leg, subsequent encounter: Secondary | ICD-10-CM | POA: Diagnosis not present

## 2016-06-08 DIAGNOSIS — N189 Chronic kidney disease, unspecified: Secondary | ICD-10-CM | POA: Diagnosis not present

## 2016-06-08 DIAGNOSIS — Z792 Long term (current) use of antibiotics: Secondary | ICD-10-CM | POA: Diagnosis not present

## 2016-06-08 DIAGNOSIS — J449 Chronic obstructive pulmonary disease, unspecified: Secondary | ICD-10-CM | POA: Diagnosis not present

## 2016-06-08 DIAGNOSIS — M25551 Pain in right hip: Secondary | ICD-10-CM | POA: Diagnosis not present

## 2016-06-08 DIAGNOSIS — I890 Lymphedema, not elsewhere classified: Secondary | ICD-10-CM | POA: Diagnosis not present

## 2016-06-10 DIAGNOSIS — I890 Lymphedema, not elsewhere classified: Secondary | ICD-10-CM | POA: Diagnosis not present

## 2016-06-10 DIAGNOSIS — E1122 Type 2 diabetes mellitus with diabetic chronic kidney disease: Secondary | ICD-10-CM | POA: Diagnosis not present

## 2016-06-10 DIAGNOSIS — N189 Chronic kidney disease, unspecified: Secondary | ICD-10-CM | POA: Diagnosis not present

## 2016-06-10 DIAGNOSIS — Z792 Long term (current) use of antibiotics: Secondary | ICD-10-CM | POA: Diagnosis not present

## 2016-06-10 DIAGNOSIS — M25551 Pain in right hip: Secondary | ICD-10-CM | POA: Diagnosis not present

## 2016-06-10 DIAGNOSIS — E1159 Type 2 diabetes mellitus with other circulatory complications: Secondary | ICD-10-CM | POA: Diagnosis not present

## 2016-06-10 DIAGNOSIS — Z7902 Long term (current) use of antithrombotics/antiplatelets: Secondary | ICD-10-CM | POA: Diagnosis not present

## 2016-06-10 DIAGNOSIS — S81801D Unspecified open wound, right lower leg, subsequent encounter: Secondary | ICD-10-CM | POA: Diagnosis not present

## 2016-06-10 DIAGNOSIS — J449 Chronic obstructive pulmonary disease, unspecified: Secondary | ICD-10-CM | POA: Diagnosis not present

## 2016-06-10 DIAGNOSIS — I87302 Chronic venous hypertension (idiopathic) without complications of left lower extremity: Secondary | ICD-10-CM | POA: Diagnosis not present

## 2016-06-10 DIAGNOSIS — I5022 Chronic systolic (congestive) heart failure: Secondary | ICD-10-CM | POA: Diagnosis not present

## 2016-06-11 DIAGNOSIS — J449 Chronic obstructive pulmonary disease, unspecified: Secondary | ICD-10-CM | POA: Diagnosis not present

## 2016-06-11 DIAGNOSIS — Z7902 Long term (current) use of antithrombotics/antiplatelets: Secondary | ICD-10-CM | POA: Diagnosis not present

## 2016-06-11 DIAGNOSIS — I509 Heart failure, unspecified: Secondary | ICD-10-CM | POA: Diagnosis not present

## 2016-06-11 DIAGNOSIS — E1122 Type 2 diabetes mellitus with diabetic chronic kidney disease: Secondary | ICD-10-CM | POA: Diagnosis not present

## 2016-06-11 DIAGNOSIS — M25551 Pain in right hip: Secondary | ICD-10-CM | POA: Diagnosis not present

## 2016-06-11 DIAGNOSIS — I890 Lymphedema, not elsewhere classified: Secondary | ICD-10-CM | POA: Diagnosis not present

## 2016-06-11 DIAGNOSIS — I5022 Chronic systolic (congestive) heart failure: Secondary | ICD-10-CM | POA: Diagnosis not present

## 2016-06-11 DIAGNOSIS — E1159 Type 2 diabetes mellitus with other circulatory complications: Secondary | ICD-10-CM | POA: Diagnosis not present

## 2016-06-11 DIAGNOSIS — Z792 Long term (current) use of antibiotics: Secondary | ICD-10-CM | POA: Diagnosis not present

## 2016-06-11 DIAGNOSIS — I87302 Chronic venous hypertension (idiopathic) without complications of left lower extremity: Secondary | ICD-10-CM | POA: Diagnosis not present

## 2016-06-11 DIAGNOSIS — S81801D Unspecified open wound, right lower leg, subsequent encounter: Secondary | ICD-10-CM | POA: Diagnosis not present

## 2016-06-11 DIAGNOSIS — N189 Chronic kidney disease, unspecified: Secondary | ICD-10-CM | POA: Diagnosis not present

## 2016-06-14 DIAGNOSIS — J449 Chronic obstructive pulmonary disease, unspecified: Secondary | ICD-10-CM | POA: Diagnosis not present

## 2016-06-14 DIAGNOSIS — Z7902 Long term (current) use of antithrombotics/antiplatelets: Secondary | ICD-10-CM | POA: Diagnosis not present

## 2016-06-14 DIAGNOSIS — I87302 Chronic venous hypertension (idiopathic) without complications of left lower extremity: Secondary | ICD-10-CM | POA: Diagnosis not present

## 2016-06-14 DIAGNOSIS — M25551 Pain in right hip: Secondary | ICD-10-CM | POA: Diagnosis not present

## 2016-06-14 DIAGNOSIS — S81801D Unspecified open wound, right lower leg, subsequent encounter: Secondary | ICD-10-CM | POA: Diagnosis not present

## 2016-06-14 DIAGNOSIS — Z792 Long term (current) use of antibiotics: Secondary | ICD-10-CM | POA: Diagnosis not present

## 2016-06-14 DIAGNOSIS — E1159 Type 2 diabetes mellitus with other circulatory complications: Secondary | ICD-10-CM | POA: Diagnosis not present

## 2016-06-14 DIAGNOSIS — I5022 Chronic systolic (congestive) heart failure: Secondary | ICD-10-CM | POA: Diagnosis not present

## 2016-06-14 DIAGNOSIS — N189 Chronic kidney disease, unspecified: Secondary | ICD-10-CM | POA: Diagnosis not present

## 2016-06-14 DIAGNOSIS — I890 Lymphedema, not elsewhere classified: Secondary | ICD-10-CM | POA: Diagnosis not present

## 2016-06-14 DIAGNOSIS — E1122 Type 2 diabetes mellitus with diabetic chronic kidney disease: Secondary | ICD-10-CM | POA: Diagnosis not present

## 2016-06-16 DIAGNOSIS — Z792 Long term (current) use of antibiotics: Secondary | ICD-10-CM | POA: Diagnosis not present

## 2016-06-16 DIAGNOSIS — N189 Chronic kidney disease, unspecified: Secondary | ICD-10-CM | POA: Diagnosis not present

## 2016-06-16 DIAGNOSIS — Z7902 Long term (current) use of antithrombotics/antiplatelets: Secondary | ICD-10-CM | POA: Diagnosis not present

## 2016-06-16 DIAGNOSIS — E1159 Type 2 diabetes mellitus with other circulatory complications: Secondary | ICD-10-CM | POA: Diagnosis not present

## 2016-06-16 DIAGNOSIS — E1122 Type 2 diabetes mellitus with diabetic chronic kidney disease: Secondary | ICD-10-CM | POA: Diagnosis not present

## 2016-06-16 DIAGNOSIS — I87302 Chronic venous hypertension (idiopathic) without complications of left lower extremity: Secondary | ICD-10-CM | POA: Diagnosis not present

## 2016-06-16 DIAGNOSIS — M25551 Pain in right hip: Secondary | ICD-10-CM | POA: Diagnosis not present

## 2016-06-16 DIAGNOSIS — I890 Lymphedema, not elsewhere classified: Secondary | ICD-10-CM | POA: Diagnosis not present

## 2016-06-16 DIAGNOSIS — I5022 Chronic systolic (congestive) heart failure: Secondary | ICD-10-CM | POA: Diagnosis not present

## 2016-06-16 DIAGNOSIS — J449 Chronic obstructive pulmonary disease, unspecified: Secondary | ICD-10-CM | POA: Diagnosis not present

## 2016-06-16 DIAGNOSIS — S81801D Unspecified open wound, right lower leg, subsequent encounter: Secondary | ICD-10-CM | POA: Diagnosis not present

## 2016-06-17 DIAGNOSIS — S81801D Unspecified open wound, right lower leg, subsequent encounter: Secondary | ICD-10-CM | POA: Diagnosis not present

## 2016-06-17 DIAGNOSIS — Z792 Long term (current) use of antibiotics: Secondary | ICD-10-CM | POA: Diagnosis not present

## 2016-06-17 DIAGNOSIS — I890 Lymphedema, not elsewhere classified: Secondary | ICD-10-CM | POA: Diagnosis not present

## 2016-06-17 DIAGNOSIS — I5022 Chronic systolic (congestive) heart failure: Secondary | ICD-10-CM | POA: Diagnosis not present

## 2016-06-17 DIAGNOSIS — N189 Chronic kidney disease, unspecified: Secondary | ICD-10-CM | POA: Diagnosis not present

## 2016-06-17 DIAGNOSIS — Z7902 Long term (current) use of antithrombotics/antiplatelets: Secondary | ICD-10-CM | POA: Diagnosis not present

## 2016-06-17 DIAGNOSIS — E1122 Type 2 diabetes mellitus with diabetic chronic kidney disease: Secondary | ICD-10-CM | POA: Diagnosis not present

## 2016-06-17 DIAGNOSIS — E1159 Type 2 diabetes mellitus with other circulatory complications: Secondary | ICD-10-CM | POA: Diagnosis not present

## 2016-06-17 DIAGNOSIS — I87302 Chronic venous hypertension (idiopathic) without complications of left lower extremity: Secondary | ICD-10-CM | POA: Diagnosis not present

## 2016-06-17 DIAGNOSIS — J449 Chronic obstructive pulmonary disease, unspecified: Secondary | ICD-10-CM | POA: Diagnosis not present

## 2016-06-17 DIAGNOSIS — M25551 Pain in right hip: Secondary | ICD-10-CM | POA: Diagnosis not present

## 2016-06-21 DIAGNOSIS — I87302 Chronic venous hypertension (idiopathic) without complications of left lower extremity: Secondary | ICD-10-CM | POA: Diagnosis not present

## 2016-06-21 DIAGNOSIS — E1159 Type 2 diabetes mellitus with other circulatory complications: Secondary | ICD-10-CM | POA: Diagnosis not present

## 2016-06-21 DIAGNOSIS — I890 Lymphedema, not elsewhere classified: Secondary | ICD-10-CM | POA: Diagnosis not present

## 2016-06-21 DIAGNOSIS — M25551 Pain in right hip: Secondary | ICD-10-CM | POA: Diagnosis not present

## 2016-06-21 DIAGNOSIS — S81801D Unspecified open wound, right lower leg, subsequent encounter: Secondary | ICD-10-CM | POA: Diagnosis not present

## 2016-06-21 DIAGNOSIS — Z7902 Long term (current) use of antithrombotics/antiplatelets: Secondary | ICD-10-CM | POA: Diagnosis not present

## 2016-06-21 DIAGNOSIS — E1122 Type 2 diabetes mellitus with diabetic chronic kidney disease: Secondary | ICD-10-CM | POA: Diagnosis not present

## 2016-06-21 DIAGNOSIS — I5022 Chronic systolic (congestive) heart failure: Secondary | ICD-10-CM | POA: Diagnosis not present

## 2016-06-21 DIAGNOSIS — N189 Chronic kidney disease, unspecified: Secondary | ICD-10-CM | POA: Diagnosis not present

## 2016-06-21 DIAGNOSIS — J449 Chronic obstructive pulmonary disease, unspecified: Secondary | ICD-10-CM | POA: Diagnosis not present

## 2016-06-21 DIAGNOSIS — Z792 Long term (current) use of antibiotics: Secondary | ICD-10-CM | POA: Diagnosis not present

## 2016-06-23 DIAGNOSIS — M25551 Pain in right hip: Secondary | ICD-10-CM | POA: Diagnosis not present

## 2016-06-23 DIAGNOSIS — I5022 Chronic systolic (congestive) heart failure: Secondary | ICD-10-CM | POA: Diagnosis not present

## 2016-06-23 DIAGNOSIS — I87302 Chronic venous hypertension (idiopathic) without complications of left lower extremity: Secondary | ICD-10-CM | POA: Diagnosis not present

## 2016-06-23 DIAGNOSIS — E1159 Type 2 diabetes mellitus with other circulatory complications: Secondary | ICD-10-CM | POA: Diagnosis not present

## 2016-06-23 DIAGNOSIS — J449 Chronic obstructive pulmonary disease, unspecified: Secondary | ICD-10-CM | POA: Diagnosis not present

## 2016-06-23 DIAGNOSIS — Z792 Long term (current) use of antibiotics: Secondary | ICD-10-CM | POA: Diagnosis not present

## 2016-06-23 DIAGNOSIS — Z7902 Long term (current) use of antithrombotics/antiplatelets: Secondary | ICD-10-CM | POA: Diagnosis not present

## 2016-06-23 DIAGNOSIS — S81801D Unspecified open wound, right lower leg, subsequent encounter: Secondary | ICD-10-CM | POA: Diagnosis not present

## 2016-06-23 DIAGNOSIS — I10 Essential (primary) hypertension: Secondary | ICD-10-CM | POA: Diagnosis not present

## 2016-06-23 DIAGNOSIS — E1165 Type 2 diabetes mellitus with hyperglycemia: Secondary | ICD-10-CM | POA: Diagnosis not present

## 2016-06-23 DIAGNOSIS — N189 Chronic kidney disease, unspecified: Secondary | ICD-10-CM | POA: Diagnosis not present

## 2016-06-23 DIAGNOSIS — I890 Lymphedema, not elsewhere classified: Secondary | ICD-10-CM | POA: Diagnosis not present

## 2016-06-23 DIAGNOSIS — E781 Pure hyperglyceridemia: Secondary | ICD-10-CM | POA: Diagnosis not present

## 2016-06-23 DIAGNOSIS — E1122 Type 2 diabetes mellitus with diabetic chronic kidney disease: Secondary | ICD-10-CM | POA: Diagnosis not present

## 2016-06-24 DIAGNOSIS — M25551 Pain in right hip: Secondary | ICD-10-CM | POA: Diagnosis not present

## 2016-06-24 DIAGNOSIS — Z7902 Long term (current) use of antithrombotics/antiplatelets: Secondary | ICD-10-CM | POA: Diagnosis not present

## 2016-06-24 DIAGNOSIS — I158 Other secondary hypertension: Secondary | ICD-10-CM | POA: Diagnosis not present

## 2016-06-24 DIAGNOSIS — E1122 Type 2 diabetes mellitus with diabetic chronic kidney disease: Secondary | ICD-10-CM | POA: Diagnosis not present

## 2016-06-24 DIAGNOSIS — S81801D Unspecified open wound, right lower leg, subsequent encounter: Secondary | ICD-10-CM | POA: Diagnosis not present

## 2016-06-24 DIAGNOSIS — N189 Chronic kidney disease, unspecified: Secondary | ICD-10-CM | POA: Diagnosis not present

## 2016-06-24 DIAGNOSIS — E784 Other hyperlipidemia: Secondary | ICD-10-CM | POA: Diagnosis not present

## 2016-06-24 DIAGNOSIS — Z792 Long term (current) use of antibiotics: Secondary | ICD-10-CM | POA: Diagnosis not present

## 2016-06-24 DIAGNOSIS — E1165 Type 2 diabetes mellitus with hyperglycemia: Secondary | ICD-10-CM | POA: Diagnosis not present

## 2016-06-24 DIAGNOSIS — E1159 Type 2 diabetes mellitus with other circulatory complications: Secondary | ICD-10-CM | POA: Diagnosis not present

## 2016-06-24 DIAGNOSIS — I87302 Chronic venous hypertension (idiopathic) without complications of left lower extremity: Secondary | ICD-10-CM | POA: Diagnosis not present

## 2016-06-24 DIAGNOSIS — I5022 Chronic systolic (congestive) heart failure: Secondary | ICD-10-CM | POA: Diagnosis not present

## 2016-06-24 DIAGNOSIS — J449 Chronic obstructive pulmonary disease, unspecified: Secondary | ICD-10-CM | POA: Diagnosis not present

## 2016-06-24 DIAGNOSIS — I890 Lymphedema, not elsewhere classified: Secondary | ICD-10-CM | POA: Diagnosis not present

## 2016-06-28 DIAGNOSIS — Z792 Long term (current) use of antibiotics: Secondary | ICD-10-CM | POA: Diagnosis not present

## 2016-06-28 DIAGNOSIS — I890 Lymphedema, not elsewhere classified: Secondary | ICD-10-CM | POA: Diagnosis not present

## 2016-06-28 DIAGNOSIS — J449 Chronic obstructive pulmonary disease, unspecified: Secondary | ICD-10-CM | POA: Diagnosis not present

## 2016-06-28 DIAGNOSIS — N189 Chronic kidney disease, unspecified: Secondary | ICD-10-CM | POA: Diagnosis not present

## 2016-06-28 DIAGNOSIS — E1122 Type 2 diabetes mellitus with diabetic chronic kidney disease: Secondary | ICD-10-CM | POA: Diagnosis not present

## 2016-06-28 DIAGNOSIS — I87302 Chronic venous hypertension (idiopathic) without complications of left lower extremity: Secondary | ICD-10-CM | POA: Diagnosis not present

## 2016-06-28 DIAGNOSIS — Z7902 Long term (current) use of antithrombotics/antiplatelets: Secondary | ICD-10-CM | POA: Diagnosis not present

## 2016-06-28 DIAGNOSIS — M25551 Pain in right hip: Secondary | ICD-10-CM | POA: Diagnosis not present

## 2016-06-28 DIAGNOSIS — E1159 Type 2 diabetes mellitus with other circulatory complications: Secondary | ICD-10-CM | POA: Diagnosis not present

## 2016-06-28 DIAGNOSIS — S81801D Unspecified open wound, right lower leg, subsequent encounter: Secondary | ICD-10-CM | POA: Diagnosis not present

## 2016-06-28 DIAGNOSIS — I5022 Chronic systolic (congestive) heart failure: Secondary | ICD-10-CM | POA: Diagnosis not present

## 2016-06-30 DIAGNOSIS — E1122 Type 2 diabetes mellitus with diabetic chronic kidney disease: Secondary | ICD-10-CM | POA: Diagnosis not present

## 2016-06-30 DIAGNOSIS — E1159 Type 2 diabetes mellitus with other circulatory complications: Secondary | ICD-10-CM | POA: Diagnosis not present

## 2016-06-30 DIAGNOSIS — I87302 Chronic venous hypertension (idiopathic) without complications of left lower extremity: Secondary | ICD-10-CM | POA: Diagnosis not present

## 2016-06-30 DIAGNOSIS — M25551 Pain in right hip: Secondary | ICD-10-CM | POA: Diagnosis not present

## 2016-06-30 DIAGNOSIS — S81801D Unspecified open wound, right lower leg, subsequent encounter: Secondary | ICD-10-CM | POA: Diagnosis not present

## 2016-06-30 DIAGNOSIS — I890 Lymphedema, not elsewhere classified: Secondary | ICD-10-CM | POA: Diagnosis not present

## 2016-06-30 DIAGNOSIS — Z792 Long term (current) use of antibiotics: Secondary | ICD-10-CM | POA: Diagnosis not present

## 2016-06-30 DIAGNOSIS — Z7902 Long term (current) use of antithrombotics/antiplatelets: Secondary | ICD-10-CM | POA: Diagnosis not present

## 2016-06-30 DIAGNOSIS — I5022 Chronic systolic (congestive) heart failure: Secondary | ICD-10-CM | POA: Diagnosis not present

## 2016-06-30 DIAGNOSIS — N189 Chronic kidney disease, unspecified: Secondary | ICD-10-CM | POA: Diagnosis not present

## 2016-06-30 DIAGNOSIS — J449 Chronic obstructive pulmonary disease, unspecified: Secondary | ICD-10-CM | POA: Diagnosis not present

## 2016-07-01 DIAGNOSIS — E1122 Type 2 diabetes mellitus with diabetic chronic kidney disease: Secondary | ICD-10-CM | POA: Diagnosis not present

## 2016-07-01 DIAGNOSIS — J449 Chronic obstructive pulmonary disease, unspecified: Secondary | ICD-10-CM | POA: Diagnosis not present

## 2016-07-01 DIAGNOSIS — Z7902 Long term (current) use of antithrombotics/antiplatelets: Secondary | ICD-10-CM | POA: Diagnosis not present

## 2016-07-01 DIAGNOSIS — I890 Lymphedema, not elsewhere classified: Secondary | ICD-10-CM | POA: Diagnosis not present

## 2016-07-01 DIAGNOSIS — I87302 Chronic venous hypertension (idiopathic) without complications of left lower extremity: Secondary | ICD-10-CM | POA: Diagnosis not present

## 2016-07-01 DIAGNOSIS — M25551 Pain in right hip: Secondary | ICD-10-CM | POA: Diagnosis not present

## 2016-07-01 DIAGNOSIS — Z792 Long term (current) use of antibiotics: Secondary | ICD-10-CM | POA: Diagnosis not present

## 2016-07-01 DIAGNOSIS — I5022 Chronic systolic (congestive) heart failure: Secondary | ICD-10-CM | POA: Diagnosis not present

## 2016-07-01 DIAGNOSIS — S81801D Unspecified open wound, right lower leg, subsequent encounter: Secondary | ICD-10-CM | POA: Diagnosis not present

## 2016-07-01 DIAGNOSIS — N189 Chronic kidney disease, unspecified: Secondary | ICD-10-CM | POA: Diagnosis not present

## 2016-07-01 DIAGNOSIS — E1159 Type 2 diabetes mellitus with other circulatory complications: Secondary | ICD-10-CM | POA: Diagnosis not present

## 2016-07-05 DIAGNOSIS — I87302 Chronic venous hypertension (idiopathic) without complications of left lower extremity: Secondary | ICD-10-CM | POA: Diagnosis not present

## 2016-07-05 DIAGNOSIS — M25551 Pain in right hip: Secondary | ICD-10-CM | POA: Diagnosis not present

## 2016-07-05 DIAGNOSIS — S81801D Unspecified open wound, right lower leg, subsequent encounter: Secondary | ICD-10-CM | POA: Diagnosis not present

## 2016-07-05 DIAGNOSIS — J449 Chronic obstructive pulmonary disease, unspecified: Secondary | ICD-10-CM | POA: Diagnosis not present

## 2016-07-05 DIAGNOSIS — Z7902 Long term (current) use of antithrombotics/antiplatelets: Secondary | ICD-10-CM | POA: Diagnosis not present

## 2016-07-05 DIAGNOSIS — I890 Lymphedema, not elsewhere classified: Secondary | ICD-10-CM | POA: Diagnosis not present

## 2016-07-05 DIAGNOSIS — N189 Chronic kidney disease, unspecified: Secondary | ICD-10-CM | POA: Diagnosis not present

## 2016-07-05 DIAGNOSIS — I5022 Chronic systolic (congestive) heart failure: Secondary | ICD-10-CM | POA: Diagnosis not present

## 2016-07-05 DIAGNOSIS — Z792 Long term (current) use of antibiotics: Secondary | ICD-10-CM | POA: Diagnosis not present

## 2016-07-05 DIAGNOSIS — E1159 Type 2 diabetes mellitus with other circulatory complications: Secondary | ICD-10-CM | POA: Diagnosis not present

## 2016-07-05 DIAGNOSIS — E1122 Type 2 diabetes mellitus with diabetic chronic kidney disease: Secondary | ICD-10-CM | POA: Diagnosis not present

## 2016-07-07 DIAGNOSIS — E1122 Type 2 diabetes mellitus with diabetic chronic kidney disease: Secondary | ICD-10-CM | POA: Diagnosis not present

## 2016-07-07 DIAGNOSIS — J449 Chronic obstructive pulmonary disease, unspecified: Secondary | ICD-10-CM | POA: Diagnosis not present

## 2016-07-07 DIAGNOSIS — I87302 Chronic venous hypertension (idiopathic) without complications of left lower extremity: Secondary | ICD-10-CM | POA: Diagnosis not present

## 2016-07-07 DIAGNOSIS — I890 Lymphedema, not elsewhere classified: Secondary | ICD-10-CM | POA: Diagnosis not present

## 2016-07-07 DIAGNOSIS — M25551 Pain in right hip: Secondary | ICD-10-CM | POA: Diagnosis not present

## 2016-07-07 DIAGNOSIS — E1159 Type 2 diabetes mellitus with other circulatory complications: Secondary | ICD-10-CM | POA: Diagnosis not present

## 2016-07-07 DIAGNOSIS — I5022 Chronic systolic (congestive) heart failure: Secondary | ICD-10-CM | POA: Diagnosis not present

## 2016-07-07 DIAGNOSIS — Z7902 Long term (current) use of antithrombotics/antiplatelets: Secondary | ICD-10-CM | POA: Diagnosis not present

## 2016-07-07 DIAGNOSIS — S81801D Unspecified open wound, right lower leg, subsequent encounter: Secondary | ICD-10-CM | POA: Diagnosis not present

## 2016-07-07 DIAGNOSIS — Z792 Long term (current) use of antibiotics: Secondary | ICD-10-CM | POA: Diagnosis not present

## 2016-07-07 DIAGNOSIS — N189 Chronic kidney disease, unspecified: Secondary | ICD-10-CM | POA: Diagnosis not present

## 2016-07-08 DIAGNOSIS — J449 Chronic obstructive pulmonary disease, unspecified: Secondary | ICD-10-CM | POA: Diagnosis not present

## 2016-07-08 DIAGNOSIS — I87302 Chronic venous hypertension (idiopathic) without complications of left lower extremity: Secondary | ICD-10-CM | POA: Diagnosis not present

## 2016-07-08 DIAGNOSIS — Z7902 Long term (current) use of antithrombotics/antiplatelets: Secondary | ICD-10-CM | POA: Diagnosis not present

## 2016-07-08 DIAGNOSIS — E1159 Type 2 diabetes mellitus with other circulatory complications: Secondary | ICD-10-CM | POA: Diagnosis not present

## 2016-07-08 DIAGNOSIS — I890 Lymphedema, not elsewhere classified: Secondary | ICD-10-CM | POA: Diagnosis not present

## 2016-07-08 DIAGNOSIS — I5022 Chronic systolic (congestive) heart failure: Secondary | ICD-10-CM | POA: Diagnosis not present

## 2016-07-08 DIAGNOSIS — M25551 Pain in right hip: Secondary | ICD-10-CM | POA: Diagnosis not present

## 2016-07-08 DIAGNOSIS — S81801D Unspecified open wound, right lower leg, subsequent encounter: Secondary | ICD-10-CM | POA: Diagnosis not present

## 2016-07-08 DIAGNOSIS — N189 Chronic kidney disease, unspecified: Secondary | ICD-10-CM | POA: Diagnosis not present

## 2016-07-08 DIAGNOSIS — Z792 Long term (current) use of antibiotics: Secondary | ICD-10-CM | POA: Diagnosis not present

## 2016-07-08 DIAGNOSIS — E1122 Type 2 diabetes mellitus with diabetic chronic kidney disease: Secondary | ICD-10-CM | POA: Diagnosis not present

## 2016-07-08 IMAGING — DX DG CHEST 1V
1 series · 1 of 1 positions shown · non-contrast
Comparison: 12/21/2015

CLINICAL DATA: Pt to ED c/o worsening right lower extremity
redness, pain, and swelling since yesterday, also reports recent
weight gain, h/o chf, copd, at home oxygen 2-3L

EXAM:
CHEST 1 VIEW

[chest ap]
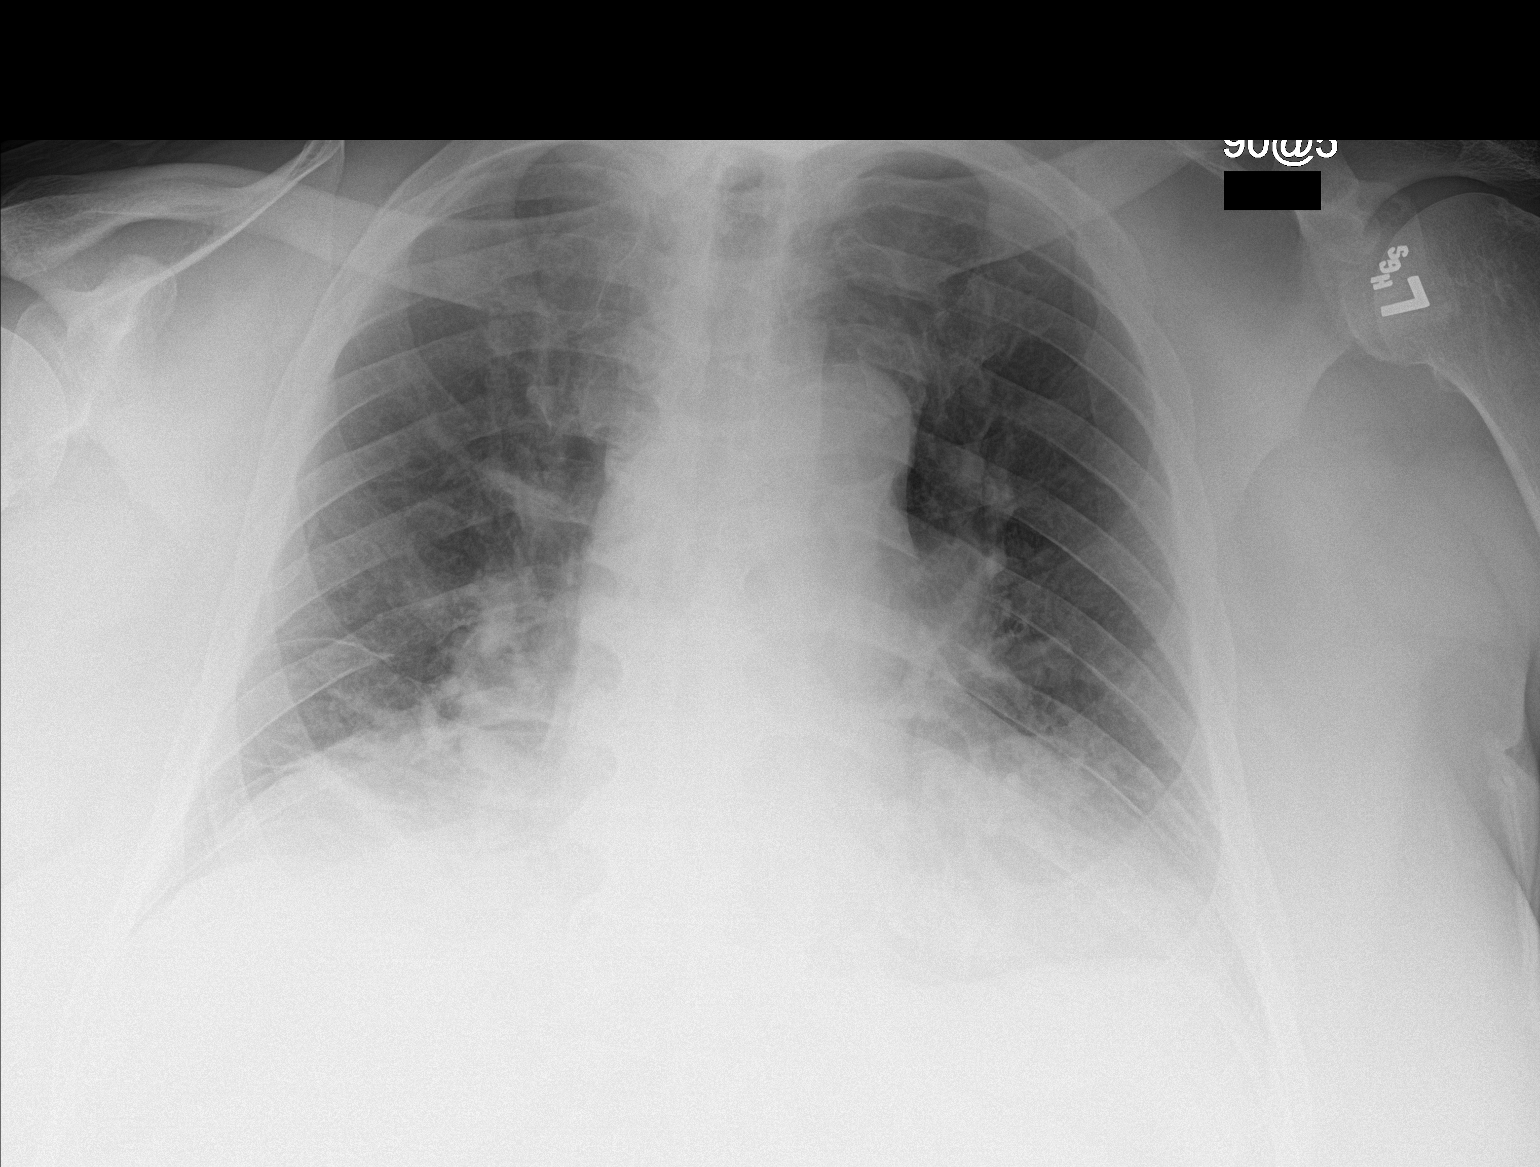

[1 of 1 positions shown; findings below may reference images not displayed]

FINDINGS: Stable mild cardiac enlargement. Vascular congestion similar to
prior study. Mild bibasilar opacities also similar to prior study.
No definite pulmonary edema.
IMPRESSION: Vascular congestion with hypoventilatory change at both bases
similar to prior study

## 2016-07-12 DIAGNOSIS — I5022 Chronic systolic (congestive) heart failure: Secondary | ICD-10-CM | POA: Diagnosis not present

## 2016-07-12 DIAGNOSIS — I87302 Chronic venous hypertension (idiopathic) without complications of left lower extremity: Secondary | ICD-10-CM | POA: Diagnosis not present

## 2016-07-12 DIAGNOSIS — E1159 Type 2 diabetes mellitus with other circulatory complications: Secondary | ICD-10-CM | POA: Diagnosis not present

## 2016-07-12 DIAGNOSIS — S81801D Unspecified open wound, right lower leg, subsequent encounter: Secondary | ICD-10-CM | POA: Diagnosis not present

## 2016-07-12 DIAGNOSIS — I890 Lymphedema, not elsewhere classified: Secondary | ICD-10-CM | POA: Diagnosis not present

## 2016-07-12 DIAGNOSIS — E1122 Type 2 diabetes mellitus with diabetic chronic kidney disease: Secondary | ICD-10-CM | POA: Diagnosis not present

## 2016-07-12 DIAGNOSIS — Z792 Long term (current) use of antibiotics: Secondary | ICD-10-CM | POA: Diagnosis not present

## 2016-07-12 DIAGNOSIS — I509 Heart failure, unspecified: Secondary | ICD-10-CM | POA: Diagnosis not present

## 2016-07-12 DIAGNOSIS — J449 Chronic obstructive pulmonary disease, unspecified: Secondary | ICD-10-CM | POA: Diagnosis not present

## 2016-07-12 DIAGNOSIS — M25551 Pain in right hip: Secondary | ICD-10-CM | POA: Diagnosis not present

## 2016-07-12 DIAGNOSIS — N189 Chronic kidney disease, unspecified: Secondary | ICD-10-CM | POA: Diagnosis not present

## 2016-07-12 DIAGNOSIS — Z7902 Long term (current) use of antithrombotics/antiplatelets: Secondary | ICD-10-CM | POA: Diagnosis not present

## 2016-07-13 ENCOUNTER — Emergency Department: Payer: No Typology Code available for payment source

## 2016-07-13 ENCOUNTER — Emergency Department
Admission: EM | Admit: 2016-07-13 | Discharge: 2016-07-13 | Disposition: A | Discharge status: Home / Self Care | Payer: No Typology Code available for payment source | Attending: Emergency Medicine | Admitting: Emergency Medicine

## 2016-07-13 ENCOUNTER — Encounter: Payer: Self-pay | Admitting: Medical Oncology

## 2016-07-13 DIAGNOSIS — Y939 Activity, unspecified: Secondary | ICD-10-CM | POA: Diagnosis not present

## 2016-07-13 DIAGNOSIS — J45909 Unspecified asthma, uncomplicated: Secondary | ICD-10-CM | POA: Insufficient documentation

## 2016-07-13 DIAGNOSIS — Y92410 Unspecified street and highway as the place of occurrence of the external cause: Secondary | ICD-10-CM | POA: Diagnosis not present

## 2016-07-13 DIAGNOSIS — I509 Heart failure, unspecified: Secondary | ICD-10-CM | POA: Insufficient documentation

## 2016-07-13 DIAGNOSIS — J449 Chronic obstructive pulmonary disease, unspecified: Secondary | ICD-10-CM | POA: Insufficient documentation

## 2016-07-13 DIAGNOSIS — M25551 Pain in right hip: Secondary | ICD-10-CM | POA: Diagnosis not present

## 2016-07-13 DIAGNOSIS — J9611 Chronic respiratory failure with hypoxia: Secondary | ICD-10-CM | POA: Diagnosis not present

## 2016-07-13 DIAGNOSIS — Y999 Unspecified external cause status: Secondary | ICD-10-CM | POA: Insufficient documentation

## 2016-07-13 DIAGNOSIS — Z7982 Long term (current) use of aspirin: Secondary | ICD-10-CM | POA: Diagnosis not present

## 2016-07-13 DIAGNOSIS — I2510 Atherosclerotic heart disease of native coronary artery without angina pectoris: Secondary | ICD-10-CM | POA: Insufficient documentation

## 2016-07-13 DIAGNOSIS — Z7984 Long term (current) use of oral hypoglycemic drugs: Secondary | ICD-10-CM | POA: Insufficient documentation

## 2016-07-13 MED ORDER — TRAMADOL HCL 50 MG PO TABS: 50.0000 mg | ORAL_TABLET | Freq: Four times a day (QID) | ORAL | 0 refills | Status: DC | PRN

## 2016-07-13 MED ORDER — TRAMADOL HCL 50 MG PO TABS
50.0000 mg | ORAL_TABLET | Freq: Once | ORAL | Status: AC
  Administered 2016-07-13: 50 mg via ORAL
  Filled 2016-07-13: qty 1

## 2016-07-13 NOTE — Discharge Instructions (Signed)
Advised patient if he is having continued pain with the hip to follow with a hospital that can accommodate his limitations for x-ray of the affected hip.
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
## 2016-07-13 NOTE — ED Notes (Signed)
Pt alert and oriented X4, active, cooperative, pt in NAD. RR even and unlabored, color WNL.  Pt informed to return if any life threatening symptoms occur.   

## 2016-07-15 DIAGNOSIS — N189 Chronic kidney disease, unspecified: Secondary | ICD-10-CM | POA: Diagnosis not present

## 2016-07-15 DIAGNOSIS — E1122 Type 2 diabetes mellitus with diabetic chronic kidney disease: Secondary | ICD-10-CM | POA: Diagnosis not present

## 2016-07-15 DIAGNOSIS — I87302 Chronic venous hypertension (idiopathic) without complications of left lower extremity: Secondary | ICD-10-CM | POA: Diagnosis not present

## 2016-07-15 DIAGNOSIS — S81801D Unspecified open wound, right lower leg, subsequent encounter: Secondary | ICD-10-CM | POA: Diagnosis not present

## 2016-07-15 DIAGNOSIS — Z7902 Long term (current) use of antithrombotics/antiplatelets: Secondary | ICD-10-CM | POA: Diagnosis not present

## 2016-07-15 DIAGNOSIS — M25551 Pain in right hip: Secondary | ICD-10-CM | POA: Diagnosis not present

## 2016-07-15 DIAGNOSIS — J449 Chronic obstructive pulmonary disease, unspecified: Secondary | ICD-10-CM | POA: Diagnosis not present

## 2016-07-15 DIAGNOSIS — Z792 Long term (current) use of antibiotics: Secondary | ICD-10-CM | POA: Diagnosis not present

## 2016-07-15 DIAGNOSIS — E1159 Type 2 diabetes mellitus with other circulatory complications: Secondary | ICD-10-CM | POA: Diagnosis not present

## 2016-07-15 DIAGNOSIS — I5022 Chronic systolic (congestive) heart failure: Secondary | ICD-10-CM | POA: Diagnosis not present

## 2016-07-15 DIAGNOSIS — I890 Lymphedema, not elsewhere classified: Secondary | ICD-10-CM | POA: Diagnosis not present

## 2016-07-19 DIAGNOSIS — I5022 Chronic systolic (congestive) heart failure: Secondary | ICD-10-CM | POA: Diagnosis not present

## 2016-07-19 DIAGNOSIS — I87302 Chronic venous hypertension (idiopathic) without complications of left lower extremity: Secondary | ICD-10-CM | POA: Diagnosis not present

## 2016-07-19 DIAGNOSIS — J449 Chronic obstructive pulmonary disease, unspecified: Secondary | ICD-10-CM | POA: Diagnosis not present

## 2016-07-19 DIAGNOSIS — M25551 Pain in right hip: Secondary | ICD-10-CM | POA: Diagnosis not present

## 2016-07-19 DIAGNOSIS — Z7902 Long term (current) use of antithrombotics/antiplatelets: Secondary | ICD-10-CM | POA: Diagnosis not present

## 2016-07-19 DIAGNOSIS — E1159 Type 2 diabetes mellitus with other circulatory complications: Secondary | ICD-10-CM | POA: Diagnosis not present

## 2016-07-19 DIAGNOSIS — I890 Lymphedema, not elsewhere classified: Secondary | ICD-10-CM | POA: Diagnosis not present

## 2016-07-19 DIAGNOSIS — S81801D Unspecified open wound, right lower leg, subsequent encounter: Secondary | ICD-10-CM | POA: Diagnosis not present

## 2016-07-19 DIAGNOSIS — E1122 Type 2 diabetes mellitus with diabetic chronic kidney disease: Secondary | ICD-10-CM | POA: Diagnosis not present

## 2016-07-19 DIAGNOSIS — N189 Chronic kidney disease, unspecified: Secondary | ICD-10-CM | POA: Diagnosis not present

## 2016-07-19 DIAGNOSIS — Z792 Long term (current) use of antibiotics: Secondary | ICD-10-CM | POA: Diagnosis not present

## 2016-07-20 DIAGNOSIS — J449 Chronic obstructive pulmonary disease, unspecified: Secondary | ICD-10-CM | POA: Diagnosis not present

## 2016-07-20 DIAGNOSIS — M25551 Pain in right hip: Secondary | ICD-10-CM | POA: Diagnosis not present

## 2016-07-20 DIAGNOSIS — N189 Chronic kidney disease, unspecified: Secondary | ICD-10-CM | POA: Diagnosis not present

## 2016-07-20 DIAGNOSIS — I890 Lymphedema, not elsewhere classified: Secondary | ICD-10-CM | POA: Diagnosis not present

## 2016-07-20 DIAGNOSIS — E1122 Type 2 diabetes mellitus with diabetic chronic kidney disease: Secondary | ICD-10-CM | POA: Diagnosis not present

## 2016-07-20 DIAGNOSIS — Z7902 Long term (current) use of antithrombotics/antiplatelets: Secondary | ICD-10-CM | POA: Diagnosis not present

## 2016-07-20 DIAGNOSIS — I5022 Chronic systolic (congestive) heart failure: Secondary | ICD-10-CM | POA: Diagnosis not present

## 2016-07-20 DIAGNOSIS — I87302 Chronic venous hypertension (idiopathic) without complications of left lower extremity: Secondary | ICD-10-CM | POA: Diagnosis not present

## 2016-07-20 DIAGNOSIS — Z792 Long term (current) use of antibiotics: Secondary | ICD-10-CM | POA: Diagnosis not present

## 2016-07-20 DIAGNOSIS — S81801D Unspecified open wound, right lower leg, subsequent encounter: Secondary | ICD-10-CM | POA: Diagnosis not present

## 2016-07-20 DIAGNOSIS — E1159 Type 2 diabetes mellitus with other circulatory complications: Secondary | ICD-10-CM | POA: Diagnosis not present

## 2016-07-21 DIAGNOSIS — I890 Lymphedema, not elsewhere classified: Secondary | ICD-10-CM | POA: Diagnosis not present

## 2016-07-21 DIAGNOSIS — E1159 Type 2 diabetes mellitus with other circulatory complications: Secondary | ICD-10-CM | POA: Diagnosis not present

## 2016-07-21 DIAGNOSIS — S81801D Unspecified open wound, right lower leg, subsequent encounter: Secondary | ICD-10-CM | POA: Diagnosis not present

## 2016-07-21 DIAGNOSIS — E1122 Type 2 diabetes mellitus with diabetic chronic kidney disease: Secondary | ICD-10-CM | POA: Diagnosis not present

## 2016-07-21 DIAGNOSIS — J449 Chronic obstructive pulmonary disease, unspecified: Secondary | ICD-10-CM | POA: Diagnosis not present

## 2016-07-21 DIAGNOSIS — I87302 Chronic venous hypertension (idiopathic) without complications of left lower extremity: Secondary | ICD-10-CM | POA: Diagnosis not present

## 2016-07-21 DIAGNOSIS — M25551 Pain in right hip: Secondary | ICD-10-CM | POA: Diagnosis not present

## 2016-07-21 DIAGNOSIS — Z792 Long term (current) use of antibiotics: Secondary | ICD-10-CM | POA: Diagnosis not present

## 2016-07-21 DIAGNOSIS — N189 Chronic kidney disease, unspecified: Secondary | ICD-10-CM | POA: Diagnosis not present

## 2016-07-21 DIAGNOSIS — I5022 Chronic systolic (congestive) heart failure: Secondary | ICD-10-CM | POA: Diagnosis not present

## 2016-07-21 DIAGNOSIS — Z7902 Long term (current) use of antithrombotics/antiplatelets: Secondary | ICD-10-CM | POA: Diagnosis not present

## 2016-07-22 DIAGNOSIS — E1159 Type 2 diabetes mellitus with other circulatory complications: Secondary | ICD-10-CM | POA: Diagnosis not present

## 2016-07-22 DIAGNOSIS — E1122 Type 2 diabetes mellitus with diabetic chronic kidney disease: Secondary | ICD-10-CM | POA: Diagnosis not present

## 2016-07-22 DIAGNOSIS — Z7902 Long term (current) use of antithrombotics/antiplatelets: Secondary | ICD-10-CM | POA: Diagnosis not present

## 2016-07-22 DIAGNOSIS — J449 Chronic obstructive pulmonary disease, unspecified: Secondary | ICD-10-CM | POA: Diagnosis not present

## 2016-07-22 DIAGNOSIS — I87302 Chronic venous hypertension (idiopathic) without complications of left lower extremity: Secondary | ICD-10-CM | POA: Diagnosis not present

## 2016-07-22 DIAGNOSIS — Z792 Long term (current) use of antibiotics: Secondary | ICD-10-CM | POA: Diagnosis not present

## 2016-07-22 DIAGNOSIS — S81801D Unspecified open wound, right lower leg, subsequent encounter: Secondary | ICD-10-CM | POA: Diagnosis not present

## 2016-07-22 DIAGNOSIS — M25551 Pain in right hip: Secondary | ICD-10-CM | POA: Diagnosis not present

## 2016-07-22 DIAGNOSIS — N189 Chronic kidney disease, unspecified: Secondary | ICD-10-CM | POA: Diagnosis not present

## 2016-07-22 DIAGNOSIS — I5022 Chronic systolic (congestive) heart failure: Secondary | ICD-10-CM | POA: Diagnosis not present

## 2016-07-22 DIAGNOSIS — I890 Lymphedema, not elsewhere classified: Secondary | ICD-10-CM | POA: Diagnosis not present

## 2016-07-26 DIAGNOSIS — E1122 Type 2 diabetes mellitus with diabetic chronic kidney disease: Secondary | ICD-10-CM | POA: Diagnosis not present

## 2016-07-26 DIAGNOSIS — Z7902 Long term (current) use of antithrombotics/antiplatelets: Secondary | ICD-10-CM | POA: Diagnosis not present

## 2016-07-26 DIAGNOSIS — S81801D Unspecified open wound, right lower leg, subsequent encounter: Secondary | ICD-10-CM | POA: Diagnosis not present

## 2016-07-26 DIAGNOSIS — E1159 Type 2 diabetes mellitus with other circulatory complications: Secondary | ICD-10-CM | POA: Diagnosis not present

## 2016-07-26 DIAGNOSIS — J449 Chronic obstructive pulmonary disease, unspecified: Secondary | ICD-10-CM | POA: Diagnosis not present

## 2016-07-26 DIAGNOSIS — N189 Chronic kidney disease, unspecified: Secondary | ICD-10-CM | POA: Diagnosis not present

## 2016-07-26 DIAGNOSIS — M25551 Pain in right hip: Secondary | ICD-10-CM | POA: Diagnosis not present

## 2016-07-26 DIAGNOSIS — I87302 Chronic venous hypertension (idiopathic) without complications of left lower extremity: Secondary | ICD-10-CM | POA: Diagnosis not present

## 2016-07-26 DIAGNOSIS — I5022 Chronic systolic (congestive) heart failure: Secondary | ICD-10-CM | POA: Diagnosis not present

## 2016-07-26 DIAGNOSIS — I890 Lymphedema, not elsewhere classified: Secondary | ICD-10-CM | POA: Diagnosis not present

## 2016-07-26 DIAGNOSIS — Z792 Long term (current) use of antibiotics: Secondary | ICD-10-CM | POA: Diagnosis not present

## 2016-07-27 DIAGNOSIS — Z792 Long term (current) use of antibiotics: Secondary | ICD-10-CM | POA: Diagnosis not present

## 2016-07-27 DIAGNOSIS — N189 Chronic kidney disease, unspecified: Secondary | ICD-10-CM | POA: Diagnosis not present

## 2016-07-27 DIAGNOSIS — I87302 Chronic venous hypertension (idiopathic) without complications of left lower extremity: Secondary | ICD-10-CM | POA: Diagnosis not present

## 2016-07-27 DIAGNOSIS — M25551 Pain in right hip: Secondary | ICD-10-CM | POA: Diagnosis not present

## 2016-07-27 DIAGNOSIS — E1122 Type 2 diabetes mellitus with diabetic chronic kidney disease: Secondary | ICD-10-CM | POA: Diagnosis not present

## 2016-07-27 DIAGNOSIS — E1159 Type 2 diabetes mellitus with other circulatory complications: Secondary | ICD-10-CM | POA: Diagnosis not present

## 2016-07-27 DIAGNOSIS — S81801D Unspecified open wound, right lower leg, subsequent encounter: Secondary | ICD-10-CM | POA: Diagnosis not present

## 2016-07-27 DIAGNOSIS — I5022 Chronic systolic (congestive) heart failure: Secondary | ICD-10-CM | POA: Diagnosis not present

## 2016-07-27 DIAGNOSIS — J449 Chronic obstructive pulmonary disease, unspecified: Secondary | ICD-10-CM | POA: Diagnosis not present

## 2016-07-27 DIAGNOSIS — Z7902 Long term (current) use of antithrombotics/antiplatelets: Secondary | ICD-10-CM | POA: Diagnosis not present

## 2016-07-27 DIAGNOSIS — I890 Lymphedema, not elsewhere classified: Secondary | ICD-10-CM | POA: Diagnosis not present

## 2016-07-28 DIAGNOSIS — M25551 Pain in right hip: Secondary | ICD-10-CM | POA: Diagnosis not present

## 2016-07-28 DIAGNOSIS — J449 Chronic obstructive pulmonary disease, unspecified: Secondary | ICD-10-CM | POA: Diagnosis not present

## 2016-07-28 DIAGNOSIS — Z792 Long term (current) use of antibiotics: Secondary | ICD-10-CM | POA: Diagnosis not present

## 2016-07-28 DIAGNOSIS — E1159 Type 2 diabetes mellitus with other circulatory complications: Secondary | ICD-10-CM | POA: Diagnosis not present

## 2016-07-28 DIAGNOSIS — S81801D Unspecified open wound, right lower leg, subsequent encounter: Secondary | ICD-10-CM | POA: Diagnosis not present

## 2016-07-28 DIAGNOSIS — I890 Lymphedema, not elsewhere classified: Secondary | ICD-10-CM | POA: Diagnosis not present

## 2016-07-28 DIAGNOSIS — Z7902 Long term (current) use of antithrombotics/antiplatelets: Secondary | ICD-10-CM | POA: Diagnosis not present

## 2016-07-28 DIAGNOSIS — I5022 Chronic systolic (congestive) heart failure: Secondary | ICD-10-CM | POA: Diagnosis not present

## 2016-07-28 DIAGNOSIS — E1122 Type 2 diabetes mellitus with diabetic chronic kidney disease: Secondary | ICD-10-CM | POA: Diagnosis not present

## 2016-07-28 DIAGNOSIS — I87302 Chronic venous hypertension (idiopathic) without complications of left lower extremity: Secondary | ICD-10-CM | POA: Diagnosis not present

## 2016-07-28 DIAGNOSIS — N189 Chronic kidney disease, unspecified: Secondary | ICD-10-CM | POA: Diagnosis not present

## 2016-07-29 DIAGNOSIS — S81801D Unspecified open wound, right lower leg, subsequent encounter: Secondary | ICD-10-CM | POA: Diagnosis not present

## 2016-07-29 DIAGNOSIS — I87302 Chronic venous hypertension (idiopathic) without complications of left lower extremity: Secondary | ICD-10-CM | POA: Diagnosis not present

## 2016-07-29 DIAGNOSIS — I5022 Chronic systolic (congestive) heart failure: Secondary | ICD-10-CM | POA: Diagnosis not present

## 2016-07-29 DIAGNOSIS — I890 Lymphedema, not elsewhere classified: Secondary | ICD-10-CM | POA: Diagnosis not present

## 2016-07-29 DIAGNOSIS — M25551 Pain in right hip: Secondary | ICD-10-CM | POA: Diagnosis not present

## 2016-07-29 DIAGNOSIS — E1159 Type 2 diabetes mellitus with other circulatory complications: Secondary | ICD-10-CM | POA: Diagnosis not present

## 2016-07-29 DIAGNOSIS — J449 Chronic obstructive pulmonary disease, unspecified: Secondary | ICD-10-CM | POA: Diagnosis not present

## 2016-07-29 DIAGNOSIS — N189 Chronic kidney disease, unspecified: Secondary | ICD-10-CM | POA: Diagnosis not present

## 2016-07-29 DIAGNOSIS — Z792 Long term (current) use of antibiotics: Secondary | ICD-10-CM | POA: Diagnosis not present

## 2016-07-29 DIAGNOSIS — E1122 Type 2 diabetes mellitus with diabetic chronic kidney disease: Secondary | ICD-10-CM | POA: Diagnosis not present

## 2016-07-29 DIAGNOSIS — Z7902 Long term (current) use of antithrombotics/antiplatelets: Secondary | ICD-10-CM | POA: Diagnosis not present

## 2016-08-02 DIAGNOSIS — Z7902 Long term (current) use of antithrombotics/antiplatelets: Secondary | ICD-10-CM | POA: Diagnosis not present

## 2016-08-02 DIAGNOSIS — I890 Lymphedema, not elsewhere classified: Secondary | ICD-10-CM | POA: Diagnosis not present

## 2016-08-02 DIAGNOSIS — M25551 Pain in right hip: Secondary | ICD-10-CM | POA: Diagnosis not present

## 2016-08-02 DIAGNOSIS — E1122 Type 2 diabetes mellitus with diabetic chronic kidney disease: Secondary | ICD-10-CM | POA: Diagnosis not present

## 2016-08-02 DIAGNOSIS — Z792 Long term (current) use of antibiotics: Secondary | ICD-10-CM | POA: Diagnosis not present

## 2016-08-02 DIAGNOSIS — N189 Chronic kidney disease, unspecified: Secondary | ICD-10-CM | POA: Diagnosis not present

## 2016-08-02 DIAGNOSIS — I5022 Chronic systolic (congestive) heart failure: Secondary | ICD-10-CM | POA: Diagnosis not present

## 2016-08-02 DIAGNOSIS — E1159 Type 2 diabetes mellitus with other circulatory complications: Secondary | ICD-10-CM | POA: Diagnosis not present

## 2016-08-02 DIAGNOSIS — I87302 Chronic venous hypertension (idiopathic) without complications of left lower extremity: Secondary | ICD-10-CM | POA: Diagnosis not present

## 2016-08-02 DIAGNOSIS — S81801D Unspecified open wound, right lower leg, subsequent encounter: Secondary | ICD-10-CM | POA: Diagnosis not present

## 2016-08-02 DIAGNOSIS — J449 Chronic obstructive pulmonary disease, unspecified: Secondary | ICD-10-CM | POA: Diagnosis not present

## 2016-08-03 DIAGNOSIS — M25551 Pain in right hip: Secondary | ICD-10-CM | POA: Diagnosis not present

## 2016-08-03 DIAGNOSIS — Z792 Long term (current) use of antibiotics: Secondary | ICD-10-CM | POA: Diagnosis not present

## 2016-08-03 DIAGNOSIS — I5022 Chronic systolic (congestive) heart failure: Secondary | ICD-10-CM | POA: Diagnosis not present

## 2016-08-03 DIAGNOSIS — I890 Lymphedema, not elsewhere classified: Secondary | ICD-10-CM | POA: Diagnosis not present

## 2016-08-03 DIAGNOSIS — S81801D Unspecified open wound, right lower leg, subsequent encounter: Secondary | ICD-10-CM | POA: Diagnosis not present

## 2016-08-03 DIAGNOSIS — E1122 Type 2 diabetes mellitus with diabetic chronic kidney disease: Secondary | ICD-10-CM | POA: Diagnosis not present

## 2016-08-03 DIAGNOSIS — I87302 Chronic venous hypertension (idiopathic) without complications of left lower extremity: Secondary | ICD-10-CM | POA: Diagnosis not present

## 2016-08-03 DIAGNOSIS — Z7902 Long term (current) use of antithrombotics/antiplatelets: Secondary | ICD-10-CM | POA: Diagnosis not present

## 2016-08-03 DIAGNOSIS — E1159 Type 2 diabetes mellitus with other circulatory complications: Secondary | ICD-10-CM | POA: Diagnosis not present

## 2016-08-03 DIAGNOSIS — N189 Chronic kidney disease, unspecified: Secondary | ICD-10-CM | POA: Diagnosis not present

## 2016-08-03 DIAGNOSIS — J449 Chronic obstructive pulmonary disease, unspecified: Secondary | ICD-10-CM | POA: Diagnosis not present

## 2016-08-04 DIAGNOSIS — I87302 Chronic venous hypertension (idiopathic) without complications of left lower extremity: Secondary | ICD-10-CM | POA: Diagnosis not present

## 2016-08-04 DIAGNOSIS — J449 Chronic obstructive pulmonary disease, unspecified: Secondary | ICD-10-CM | POA: Diagnosis not present

## 2016-08-04 DIAGNOSIS — M25551 Pain in right hip: Secondary | ICD-10-CM | POA: Diagnosis not present

## 2016-08-04 DIAGNOSIS — E1159 Type 2 diabetes mellitus with other circulatory complications: Secondary | ICD-10-CM | POA: Diagnosis not present

## 2016-08-04 DIAGNOSIS — S81801D Unspecified open wound, right lower leg, subsequent encounter: Secondary | ICD-10-CM | POA: Diagnosis not present

## 2016-08-04 DIAGNOSIS — Z7902 Long term (current) use of antithrombotics/antiplatelets: Secondary | ICD-10-CM | POA: Diagnosis not present

## 2016-08-04 DIAGNOSIS — I890 Lymphedema, not elsewhere classified: Secondary | ICD-10-CM | POA: Diagnosis not present

## 2016-08-04 DIAGNOSIS — Z792 Long term (current) use of antibiotics: Secondary | ICD-10-CM | POA: Diagnosis not present

## 2016-08-04 DIAGNOSIS — E1122 Type 2 diabetes mellitus with diabetic chronic kidney disease: Secondary | ICD-10-CM | POA: Diagnosis not present

## 2016-08-04 DIAGNOSIS — I5022 Chronic systolic (congestive) heart failure: Secondary | ICD-10-CM | POA: Diagnosis not present

## 2016-08-04 DIAGNOSIS — N189 Chronic kidney disease, unspecified: Secondary | ICD-10-CM | POA: Diagnosis not present

## 2016-08-05 DIAGNOSIS — Z7902 Long term (current) use of antithrombotics/antiplatelets: Secondary | ICD-10-CM | POA: Diagnosis not present

## 2016-08-05 DIAGNOSIS — N179 Acute kidney failure, unspecified: Secondary | ICD-10-CM | POA: Diagnosis not present

## 2016-08-05 DIAGNOSIS — R600 Localized edema: Secondary | ICD-10-CM | POA: Diagnosis not present

## 2016-08-05 DIAGNOSIS — E1159 Type 2 diabetes mellitus with other circulatory complications: Secondary | ICD-10-CM | POA: Diagnosis not present

## 2016-08-05 DIAGNOSIS — I87302 Chronic venous hypertension (idiopathic) without complications of left lower extremity: Secondary | ICD-10-CM | POA: Diagnosis not present

## 2016-08-05 DIAGNOSIS — E1122 Type 2 diabetes mellitus with diabetic chronic kidney disease: Secondary | ICD-10-CM | POA: Diagnosis not present

## 2016-08-05 DIAGNOSIS — M25551 Pain in right hip: Secondary | ICD-10-CM | POA: Diagnosis not present

## 2016-08-05 DIAGNOSIS — G4730 Sleep apnea, unspecified: Secondary | ICD-10-CM | POA: Diagnosis not present

## 2016-08-05 DIAGNOSIS — I890 Lymphedema, not elsewhere classified: Secondary | ICD-10-CM | POA: Diagnosis not present

## 2016-08-05 DIAGNOSIS — I5022 Chronic systolic (congestive) heart failure: Secondary | ICD-10-CM | POA: Diagnosis not present

## 2016-08-05 DIAGNOSIS — Z792 Long term (current) use of antibiotics: Secondary | ICD-10-CM | POA: Diagnosis not present

## 2016-08-05 DIAGNOSIS — J449 Chronic obstructive pulmonary disease, unspecified: Secondary | ICD-10-CM | POA: Diagnosis not present

## 2016-08-05 DIAGNOSIS — S81801D Unspecified open wound, right lower leg, subsequent encounter: Secondary | ICD-10-CM | POA: Diagnosis not present

## 2016-08-05 DIAGNOSIS — N189 Chronic kidney disease, unspecified: Secondary | ICD-10-CM | POA: Diagnosis not present

## 2016-08-06 DIAGNOSIS — Z792 Long term (current) use of antibiotics: Secondary | ICD-10-CM | POA: Diagnosis not present

## 2016-08-06 DIAGNOSIS — J449 Chronic obstructive pulmonary disease, unspecified: Secondary | ICD-10-CM | POA: Diagnosis not present

## 2016-08-06 DIAGNOSIS — N189 Chronic kidney disease, unspecified: Secondary | ICD-10-CM | POA: Diagnosis not present

## 2016-08-06 DIAGNOSIS — S81801D Unspecified open wound, right lower leg, subsequent encounter: Secondary | ICD-10-CM | POA: Diagnosis not present

## 2016-08-06 DIAGNOSIS — I87302 Chronic venous hypertension (idiopathic) without complications of left lower extremity: Secondary | ICD-10-CM | POA: Diagnosis not present

## 2016-08-06 DIAGNOSIS — Z7902 Long term (current) use of antithrombotics/antiplatelets: Secondary | ICD-10-CM | POA: Diagnosis not present

## 2016-08-06 DIAGNOSIS — I890 Lymphedema, not elsewhere classified: Secondary | ICD-10-CM | POA: Diagnosis not present

## 2016-08-06 DIAGNOSIS — E1159 Type 2 diabetes mellitus with other circulatory complications: Secondary | ICD-10-CM | POA: Diagnosis not present

## 2016-08-06 DIAGNOSIS — M25551 Pain in right hip: Secondary | ICD-10-CM | POA: Diagnosis not present

## 2016-08-06 DIAGNOSIS — I5022 Chronic systolic (congestive) heart failure: Secondary | ICD-10-CM | POA: Diagnosis not present

## 2016-08-06 DIAGNOSIS — E1122 Type 2 diabetes mellitus with diabetic chronic kidney disease: Secondary | ICD-10-CM | POA: Diagnosis not present

## 2016-08-09 DIAGNOSIS — E1159 Type 2 diabetes mellitus with other circulatory complications: Secondary | ICD-10-CM | POA: Diagnosis not present

## 2016-08-09 DIAGNOSIS — Z7982 Long term (current) use of aspirin: Secondary | ICD-10-CM | POA: Diagnosis not present

## 2016-08-09 DIAGNOSIS — I890 Lymphedema, not elsewhere classified: Secondary | ICD-10-CM | POA: Diagnosis not present

## 2016-08-09 DIAGNOSIS — S71101D Unspecified open wound, right thigh, subsequent encounter: Secondary | ICD-10-CM | POA: Diagnosis not present

## 2016-08-09 DIAGNOSIS — I87302 Chronic venous hypertension (idiopathic) without complications of left lower extremity: Secondary | ICD-10-CM | POA: Diagnosis not present

## 2016-08-09 DIAGNOSIS — N189 Chronic kidney disease, unspecified: Secondary | ICD-10-CM | POA: Diagnosis not present

## 2016-08-09 DIAGNOSIS — J9611 Chronic respiratory failure with hypoxia: Secondary | ICD-10-CM | POA: Diagnosis not present

## 2016-08-09 DIAGNOSIS — J449 Chronic obstructive pulmonary disease, unspecified: Secondary | ICD-10-CM | POA: Diagnosis not present

## 2016-08-09 DIAGNOSIS — M25551 Pain in right hip: Secondary | ICD-10-CM | POA: Diagnosis not present

## 2016-08-09 DIAGNOSIS — I5022 Chronic systolic (congestive) heart failure: Secondary | ICD-10-CM | POA: Diagnosis not present

## 2016-08-09 DIAGNOSIS — E1122 Type 2 diabetes mellitus with diabetic chronic kidney disease: Secondary | ICD-10-CM | POA: Diagnosis not present

## 2016-08-09 DIAGNOSIS — G4733 Obstructive sleep apnea (adult) (pediatric): Secondary | ICD-10-CM | POA: Diagnosis not present

## 2016-08-10 DIAGNOSIS — Z7982 Long term (current) use of aspirin: Secondary | ICD-10-CM | POA: Diagnosis not present

## 2016-08-10 DIAGNOSIS — G4733 Obstructive sleep apnea (adult) (pediatric): Secondary | ICD-10-CM | POA: Diagnosis not present

## 2016-08-10 DIAGNOSIS — I5022 Chronic systolic (congestive) heart failure: Secondary | ICD-10-CM | POA: Diagnosis not present

## 2016-08-10 DIAGNOSIS — E1122 Type 2 diabetes mellitus with diabetic chronic kidney disease: Secondary | ICD-10-CM | POA: Diagnosis not present

## 2016-08-10 DIAGNOSIS — S71101D Unspecified open wound, right thigh, subsequent encounter: Secondary | ICD-10-CM | POA: Diagnosis not present

## 2016-08-10 DIAGNOSIS — J449 Chronic obstructive pulmonary disease, unspecified: Secondary | ICD-10-CM | POA: Diagnosis not present

## 2016-08-10 DIAGNOSIS — N189 Chronic kidney disease, unspecified: Secondary | ICD-10-CM | POA: Diagnosis not present

## 2016-08-10 DIAGNOSIS — E1159 Type 2 diabetes mellitus with other circulatory complications: Secondary | ICD-10-CM | POA: Diagnosis not present

## 2016-08-10 DIAGNOSIS — M25551 Pain in right hip: Secondary | ICD-10-CM | POA: Diagnosis not present

## 2016-08-10 DIAGNOSIS — I87302 Chronic venous hypertension (idiopathic) without complications of left lower extremity: Secondary | ICD-10-CM | POA: Diagnosis not present

## 2016-08-10 DIAGNOSIS — J9611 Chronic respiratory failure with hypoxia: Secondary | ICD-10-CM | POA: Diagnosis not present

## 2016-08-10 DIAGNOSIS — I890 Lymphedema, not elsewhere classified: Secondary | ICD-10-CM | POA: Diagnosis not present

## 2016-08-11 DIAGNOSIS — I509 Heart failure, unspecified: Secondary | ICD-10-CM | POA: Diagnosis not present

## 2016-08-12 DIAGNOSIS — G4733 Obstructive sleep apnea (adult) (pediatric): Secondary | ICD-10-CM | POA: Diagnosis not present

## 2016-08-12 DIAGNOSIS — J9611 Chronic respiratory failure with hypoxia: Secondary | ICD-10-CM | POA: Diagnosis not present

## 2016-08-12 DIAGNOSIS — I5022 Chronic systolic (congestive) heart failure: Secondary | ICD-10-CM | POA: Diagnosis not present

## 2016-08-12 DIAGNOSIS — S71101D Unspecified open wound, right thigh, subsequent encounter: Secondary | ICD-10-CM | POA: Diagnosis not present

## 2016-08-12 DIAGNOSIS — Z7982 Long term (current) use of aspirin: Secondary | ICD-10-CM | POA: Diagnosis not present

## 2016-08-12 DIAGNOSIS — I87302 Chronic venous hypertension (idiopathic) without complications of left lower extremity: Secondary | ICD-10-CM | POA: Diagnosis not present

## 2016-08-12 DIAGNOSIS — E1159 Type 2 diabetes mellitus with other circulatory complications: Secondary | ICD-10-CM | POA: Diagnosis not present

## 2016-08-12 DIAGNOSIS — I890 Lymphedema, not elsewhere classified: Secondary | ICD-10-CM | POA: Diagnosis not present

## 2016-08-12 DIAGNOSIS — J449 Chronic obstructive pulmonary disease, unspecified: Secondary | ICD-10-CM | POA: Diagnosis not present

## 2016-08-12 DIAGNOSIS — M25551 Pain in right hip: Secondary | ICD-10-CM | POA: Diagnosis not present

## 2016-08-12 DIAGNOSIS — E1122 Type 2 diabetes mellitus with diabetic chronic kidney disease: Secondary | ICD-10-CM | POA: Diagnosis not present

## 2016-08-12 DIAGNOSIS — N189 Chronic kidney disease, unspecified: Secondary | ICD-10-CM | POA: Diagnosis not present

## 2016-08-16 DIAGNOSIS — I890 Lymphedema, not elsewhere classified: Secondary | ICD-10-CM | POA: Diagnosis not present

## 2016-08-16 DIAGNOSIS — I5022 Chronic systolic (congestive) heart failure: Secondary | ICD-10-CM | POA: Diagnosis not present

## 2016-08-16 DIAGNOSIS — E1122 Type 2 diabetes mellitus with diabetic chronic kidney disease: Secondary | ICD-10-CM | POA: Diagnosis not present

## 2016-08-16 DIAGNOSIS — S71101D Unspecified open wound, right thigh, subsequent encounter: Secondary | ICD-10-CM | POA: Diagnosis not present

## 2016-08-16 DIAGNOSIS — E1159 Type 2 diabetes mellitus with other circulatory complications: Secondary | ICD-10-CM | POA: Diagnosis not present

## 2016-08-16 DIAGNOSIS — Z7982 Long term (current) use of aspirin: Secondary | ICD-10-CM | POA: Diagnosis not present

## 2016-08-16 DIAGNOSIS — M25551 Pain in right hip: Secondary | ICD-10-CM | POA: Diagnosis not present

## 2016-08-16 DIAGNOSIS — I87302 Chronic venous hypertension (idiopathic) without complications of left lower extremity: Secondary | ICD-10-CM | POA: Diagnosis not present

## 2016-08-16 DIAGNOSIS — J449 Chronic obstructive pulmonary disease, unspecified: Secondary | ICD-10-CM | POA: Diagnosis not present

## 2016-08-16 DIAGNOSIS — N189 Chronic kidney disease, unspecified: Secondary | ICD-10-CM | POA: Diagnosis not present

## 2016-08-16 DIAGNOSIS — J9611 Chronic respiratory failure with hypoxia: Secondary | ICD-10-CM | POA: Diagnosis not present

## 2016-08-16 DIAGNOSIS — G4733 Obstructive sleep apnea (adult) (pediatric): Secondary | ICD-10-CM | POA: Diagnosis not present

## 2016-08-17 DIAGNOSIS — I5022 Chronic systolic (congestive) heart failure: Secondary | ICD-10-CM | POA: Diagnosis not present

## 2016-08-17 DIAGNOSIS — J9611 Chronic respiratory failure with hypoxia: Secondary | ICD-10-CM | POA: Diagnosis not present

## 2016-08-17 DIAGNOSIS — N189 Chronic kidney disease, unspecified: Secondary | ICD-10-CM | POA: Diagnosis not present

## 2016-08-17 DIAGNOSIS — I87302 Chronic venous hypertension (idiopathic) without complications of left lower extremity: Secondary | ICD-10-CM | POA: Diagnosis not present

## 2016-08-17 DIAGNOSIS — G4733 Obstructive sleep apnea (adult) (pediatric): Secondary | ICD-10-CM | POA: Diagnosis not present

## 2016-08-17 DIAGNOSIS — E1159 Type 2 diabetes mellitus with other circulatory complications: Secondary | ICD-10-CM | POA: Diagnosis not present

## 2016-08-17 DIAGNOSIS — E1122 Type 2 diabetes mellitus with diabetic chronic kidney disease: Secondary | ICD-10-CM | POA: Diagnosis not present

## 2016-08-17 DIAGNOSIS — J449 Chronic obstructive pulmonary disease, unspecified: Secondary | ICD-10-CM | POA: Diagnosis not present

## 2016-08-17 DIAGNOSIS — I890 Lymphedema, not elsewhere classified: Secondary | ICD-10-CM | POA: Diagnosis not present

## 2016-08-17 DIAGNOSIS — Z7982 Long term (current) use of aspirin: Secondary | ICD-10-CM | POA: Diagnosis not present

## 2016-08-17 DIAGNOSIS — S71101D Unspecified open wound, right thigh, subsequent encounter: Secondary | ICD-10-CM | POA: Diagnosis not present

## 2016-08-17 DIAGNOSIS — M25551 Pain in right hip: Secondary | ICD-10-CM | POA: Diagnosis not present

## 2016-08-18 DIAGNOSIS — I87302 Chronic venous hypertension (idiopathic) without complications of left lower extremity: Secondary | ICD-10-CM | POA: Diagnosis not present

## 2016-08-18 DIAGNOSIS — I890 Lymphedema, not elsewhere classified: Secondary | ICD-10-CM | POA: Diagnosis not present

## 2016-08-18 DIAGNOSIS — G4733 Obstructive sleep apnea (adult) (pediatric): Secondary | ICD-10-CM | POA: Diagnosis not present

## 2016-08-18 DIAGNOSIS — E1122 Type 2 diabetes mellitus with diabetic chronic kidney disease: Secondary | ICD-10-CM | POA: Diagnosis not present

## 2016-08-18 DIAGNOSIS — M25551 Pain in right hip: Secondary | ICD-10-CM | POA: Diagnosis not present

## 2016-08-18 DIAGNOSIS — S71101D Unspecified open wound, right thigh, subsequent encounter: Secondary | ICD-10-CM | POA: Diagnosis not present

## 2016-08-18 DIAGNOSIS — E1159 Type 2 diabetes mellitus with other circulatory complications: Secondary | ICD-10-CM | POA: Diagnosis not present

## 2016-08-18 DIAGNOSIS — Z7982 Long term (current) use of aspirin: Secondary | ICD-10-CM | POA: Diagnosis not present

## 2016-08-18 DIAGNOSIS — N189 Chronic kidney disease, unspecified: Secondary | ICD-10-CM | POA: Diagnosis not present

## 2016-08-18 DIAGNOSIS — J449 Chronic obstructive pulmonary disease, unspecified: Secondary | ICD-10-CM | POA: Diagnosis not present

## 2016-08-18 DIAGNOSIS — J9611 Chronic respiratory failure with hypoxia: Secondary | ICD-10-CM | POA: Diagnosis not present

## 2016-08-18 DIAGNOSIS — I5022 Chronic systolic (congestive) heart failure: Secondary | ICD-10-CM | POA: Diagnosis not present

## 2016-08-19 DIAGNOSIS — J449 Chronic obstructive pulmonary disease, unspecified: Secondary | ICD-10-CM | POA: Diagnosis not present

## 2016-08-19 DIAGNOSIS — N189 Chronic kidney disease, unspecified: Secondary | ICD-10-CM | POA: Diagnosis not present

## 2016-08-19 DIAGNOSIS — M25551 Pain in right hip: Secondary | ICD-10-CM | POA: Diagnosis not present

## 2016-08-19 DIAGNOSIS — I890 Lymphedema, not elsewhere classified: Secondary | ICD-10-CM | POA: Diagnosis not present

## 2016-08-19 DIAGNOSIS — E1159 Type 2 diabetes mellitus with other circulatory complications: Secondary | ICD-10-CM | POA: Diagnosis not present

## 2016-08-19 DIAGNOSIS — E1122 Type 2 diabetes mellitus with diabetic chronic kidney disease: Secondary | ICD-10-CM | POA: Diagnosis not present

## 2016-08-19 DIAGNOSIS — S71101D Unspecified open wound, right thigh, subsequent encounter: Secondary | ICD-10-CM | POA: Diagnosis not present

## 2016-08-19 DIAGNOSIS — Z7982 Long term (current) use of aspirin: Secondary | ICD-10-CM | POA: Diagnosis not present

## 2016-08-19 DIAGNOSIS — I87302 Chronic venous hypertension (idiopathic) without complications of left lower extremity: Secondary | ICD-10-CM | POA: Diagnosis not present

## 2016-08-19 DIAGNOSIS — J9611 Chronic respiratory failure with hypoxia: Secondary | ICD-10-CM | POA: Diagnosis not present

## 2016-08-19 DIAGNOSIS — I5022 Chronic systolic (congestive) heart failure: Secondary | ICD-10-CM | POA: Diagnosis not present

## 2016-08-19 DIAGNOSIS — G4733 Obstructive sleep apnea (adult) (pediatric): Secondary | ICD-10-CM | POA: Diagnosis not present

## 2016-08-23 DIAGNOSIS — N189 Chronic kidney disease, unspecified: Secondary | ICD-10-CM | POA: Diagnosis not present

## 2016-08-23 DIAGNOSIS — J449 Chronic obstructive pulmonary disease, unspecified: Secondary | ICD-10-CM | POA: Diagnosis not present

## 2016-08-23 DIAGNOSIS — Z7982 Long term (current) use of aspirin: Secondary | ICD-10-CM | POA: Diagnosis not present

## 2016-08-23 DIAGNOSIS — I890 Lymphedema, not elsewhere classified: Secondary | ICD-10-CM | POA: Diagnosis not present

## 2016-08-23 DIAGNOSIS — G4733 Obstructive sleep apnea (adult) (pediatric): Secondary | ICD-10-CM | POA: Diagnosis not present

## 2016-08-23 DIAGNOSIS — E1159 Type 2 diabetes mellitus with other circulatory complications: Secondary | ICD-10-CM | POA: Diagnosis not present

## 2016-08-23 DIAGNOSIS — I87302 Chronic venous hypertension (idiopathic) without complications of left lower extremity: Secondary | ICD-10-CM | POA: Diagnosis not present

## 2016-08-23 DIAGNOSIS — I5022 Chronic systolic (congestive) heart failure: Secondary | ICD-10-CM | POA: Diagnosis not present

## 2016-08-23 DIAGNOSIS — M25551 Pain in right hip: Secondary | ICD-10-CM | POA: Diagnosis not present

## 2016-08-23 DIAGNOSIS — S71101D Unspecified open wound, right thigh, subsequent encounter: Secondary | ICD-10-CM | POA: Diagnosis not present

## 2016-08-23 DIAGNOSIS — E1122 Type 2 diabetes mellitus with diabetic chronic kidney disease: Secondary | ICD-10-CM | POA: Diagnosis not present

## 2016-08-23 DIAGNOSIS — J9611 Chronic respiratory failure with hypoxia: Secondary | ICD-10-CM | POA: Diagnosis not present

## 2016-08-24 DIAGNOSIS — G4733 Obstructive sleep apnea (adult) (pediatric): Secondary | ICD-10-CM | POA: Diagnosis not present

## 2016-08-24 DIAGNOSIS — E1122 Type 2 diabetes mellitus with diabetic chronic kidney disease: Secondary | ICD-10-CM | POA: Diagnosis not present

## 2016-08-24 DIAGNOSIS — J449 Chronic obstructive pulmonary disease, unspecified: Secondary | ICD-10-CM | POA: Diagnosis not present

## 2016-08-24 DIAGNOSIS — S71101D Unspecified open wound, right thigh, subsequent encounter: Secondary | ICD-10-CM | POA: Diagnosis not present

## 2016-08-24 DIAGNOSIS — I890 Lymphedema, not elsewhere classified: Secondary | ICD-10-CM | POA: Diagnosis not present

## 2016-08-24 DIAGNOSIS — M25551 Pain in right hip: Secondary | ICD-10-CM | POA: Diagnosis not present

## 2016-08-24 DIAGNOSIS — I87302 Chronic venous hypertension (idiopathic) without complications of left lower extremity: Secondary | ICD-10-CM | POA: Diagnosis not present

## 2016-08-24 DIAGNOSIS — I5022 Chronic systolic (congestive) heart failure: Secondary | ICD-10-CM | POA: Diagnosis not present

## 2016-08-24 DIAGNOSIS — Z7982 Long term (current) use of aspirin: Secondary | ICD-10-CM | POA: Diagnosis not present

## 2016-08-24 DIAGNOSIS — N189 Chronic kidney disease, unspecified: Secondary | ICD-10-CM | POA: Diagnosis not present

## 2016-08-24 DIAGNOSIS — E1159 Type 2 diabetes mellitus with other circulatory complications: Secondary | ICD-10-CM | POA: Diagnosis not present

## 2016-08-24 DIAGNOSIS — J9611 Chronic respiratory failure with hypoxia: Secondary | ICD-10-CM | POA: Diagnosis not present

## 2016-08-25 DIAGNOSIS — S71101D Unspecified open wound, right thigh, subsequent encounter: Secondary | ICD-10-CM | POA: Diagnosis not present

## 2016-08-25 DIAGNOSIS — N189 Chronic kidney disease, unspecified: Secondary | ICD-10-CM | POA: Diagnosis not present

## 2016-08-25 DIAGNOSIS — M25551 Pain in right hip: Secondary | ICD-10-CM | POA: Diagnosis not present

## 2016-08-25 DIAGNOSIS — I87302 Chronic venous hypertension (idiopathic) without complications of left lower extremity: Secondary | ICD-10-CM | POA: Diagnosis not present

## 2016-08-25 DIAGNOSIS — J9611 Chronic respiratory failure with hypoxia: Secondary | ICD-10-CM | POA: Diagnosis not present

## 2016-08-25 DIAGNOSIS — Z7982 Long term (current) use of aspirin: Secondary | ICD-10-CM | POA: Diagnosis not present

## 2016-08-25 DIAGNOSIS — E1159 Type 2 diabetes mellitus with other circulatory complications: Secondary | ICD-10-CM | POA: Diagnosis not present

## 2016-08-25 DIAGNOSIS — G4733 Obstructive sleep apnea (adult) (pediatric): Secondary | ICD-10-CM | POA: Diagnosis not present

## 2016-08-25 DIAGNOSIS — J449 Chronic obstructive pulmonary disease, unspecified: Secondary | ICD-10-CM | POA: Diagnosis not present

## 2016-08-25 DIAGNOSIS — E1122 Type 2 diabetes mellitus with diabetic chronic kidney disease: Secondary | ICD-10-CM | POA: Diagnosis not present

## 2016-08-25 DIAGNOSIS — I5022 Chronic systolic (congestive) heart failure: Secondary | ICD-10-CM | POA: Diagnosis not present

## 2016-08-25 DIAGNOSIS — I890 Lymphedema, not elsewhere classified: Secondary | ICD-10-CM | POA: Diagnosis not present

## 2016-08-26 DIAGNOSIS — I890 Lymphedema, not elsewhere classified: Secondary | ICD-10-CM | POA: Diagnosis not present

## 2016-08-26 DIAGNOSIS — I5022 Chronic systolic (congestive) heart failure: Secondary | ICD-10-CM | POA: Diagnosis not present

## 2016-08-26 DIAGNOSIS — E1122 Type 2 diabetes mellitus with diabetic chronic kidney disease: Secondary | ICD-10-CM | POA: Diagnosis not present

## 2016-08-26 DIAGNOSIS — N189 Chronic kidney disease, unspecified: Secondary | ICD-10-CM | POA: Diagnosis not present

## 2016-08-26 DIAGNOSIS — J9611 Chronic respiratory failure with hypoxia: Secondary | ICD-10-CM | POA: Diagnosis not present

## 2016-08-26 DIAGNOSIS — J449 Chronic obstructive pulmonary disease, unspecified: Secondary | ICD-10-CM | POA: Diagnosis not present

## 2016-08-26 DIAGNOSIS — Z7982 Long term (current) use of aspirin: Secondary | ICD-10-CM | POA: Diagnosis not present

## 2016-08-26 DIAGNOSIS — S71101D Unspecified open wound, right thigh, subsequent encounter: Secondary | ICD-10-CM | POA: Diagnosis not present

## 2016-08-26 DIAGNOSIS — E1159 Type 2 diabetes mellitus with other circulatory complications: Secondary | ICD-10-CM | POA: Diagnosis not present

## 2016-08-26 DIAGNOSIS — G4733 Obstructive sleep apnea (adult) (pediatric): Secondary | ICD-10-CM | POA: Diagnosis not present

## 2016-08-26 DIAGNOSIS — M25551 Pain in right hip: Secondary | ICD-10-CM | POA: Diagnosis not present

## 2016-08-26 DIAGNOSIS — I87302 Chronic venous hypertension (idiopathic) without complications of left lower extremity: Secondary | ICD-10-CM | POA: Diagnosis not present

## 2016-08-30 DIAGNOSIS — G4733 Obstructive sleep apnea (adult) (pediatric): Secondary | ICD-10-CM | POA: Diagnosis not present

## 2016-08-30 DIAGNOSIS — E1122 Type 2 diabetes mellitus with diabetic chronic kidney disease: Secondary | ICD-10-CM | POA: Diagnosis not present

## 2016-08-30 DIAGNOSIS — Z7982 Long term (current) use of aspirin: Secondary | ICD-10-CM | POA: Diagnosis not present

## 2016-08-30 DIAGNOSIS — M25551 Pain in right hip: Secondary | ICD-10-CM | POA: Diagnosis not present

## 2016-08-30 DIAGNOSIS — I890 Lymphedema, not elsewhere classified: Secondary | ICD-10-CM | POA: Diagnosis not present

## 2016-08-30 DIAGNOSIS — E1159 Type 2 diabetes mellitus with other circulatory complications: Secondary | ICD-10-CM | POA: Diagnosis not present

## 2016-08-30 DIAGNOSIS — J449 Chronic obstructive pulmonary disease, unspecified: Secondary | ICD-10-CM | POA: Diagnosis not present

## 2016-08-30 DIAGNOSIS — N189 Chronic kidney disease, unspecified: Secondary | ICD-10-CM | POA: Diagnosis not present

## 2016-08-30 DIAGNOSIS — J9611 Chronic respiratory failure with hypoxia: Secondary | ICD-10-CM | POA: Diagnosis not present

## 2016-08-30 DIAGNOSIS — I5022 Chronic systolic (congestive) heart failure: Secondary | ICD-10-CM | POA: Diagnosis not present

## 2016-08-30 DIAGNOSIS — Z9981 Dependence on supplemental oxygen: Secondary | ICD-10-CM | POA: Diagnosis not present

## 2016-08-30 DIAGNOSIS — I87302 Chronic venous hypertension (idiopathic) without complications of left lower extremity: Secondary | ICD-10-CM | POA: Diagnosis not present

## 2016-08-30 DIAGNOSIS — S71101D Unspecified open wound, right thigh, subsequent encounter: Secondary | ICD-10-CM | POA: Diagnosis not present

## 2016-08-30 DIAGNOSIS — Z7902 Long term (current) use of antithrombotics/antiplatelets: Secondary | ICD-10-CM | POA: Diagnosis not present

## 2016-08-31 DIAGNOSIS — I5022 Chronic systolic (congestive) heart failure: Secondary | ICD-10-CM | POA: Diagnosis not present

## 2016-08-31 DIAGNOSIS — N189 Chronic kidney disease, unspecified: Secondary | ICD-10-CM | POA: Diagnosis not present

## 2016-08-31 DIAGNOSIS — Z7902 Long term (current) use of antithrombotics/antiplatelets: Secondary | ICD-10-CM | POA: Diagnosis not present

## 2016-08-31 DIAGNOSIS — S71101D Unspecified open wound, right thigh, subsequent encounter: Secondary | ICD-10-CM | POA: Diagnosis not present

## 2016-08-31 DIAGNOSIS — E1159 Type 2 diabetes mellitus with other circulatory complications: Secondary | ICD-10-CM | POA: Diagnosis not present

## 2016-08-31 DIAGNOSIS — E1122 Type 2 diabetes mellitus with diabetic chronic kidney disease: Secondary | ICD-10-CM | POA: Diagnosis not present

## 2016-08-31 DIAGNOSIS — I87302 Chronic venous hypertension (idiopathic) without complications of left lower extremity: Secondary | ICD-10-CM | POA: Diagnosis not present

## 2016-08-31 DIAGNOSIS — Z9981 Dependence on supplemental oxygen: Secondary | ICD-10-CM | POA: Diagnosis not present

## 2016-08-31 DIAGNOSIS — M25551 Pain in right hip: Secondary | ICD-10-CM | POA: Diagnosis not present

## 2016-08-31 DIAGNOSIS — I890 Lymphedema, not elsewhere classified: Secondary | ICD-10-CM | POA: Diagnosis not present

## 2016-08-31 DIAGNOSIS — J9611 Chronic respiratory failure with hypoxia: Secondary | ICD-10-CM | POA: Diagnosis not present

## 2016-08-31 DIAGNOSIS — Z7982 Long term (current) use of aspirin: Secondary | ICD-10-CM | POA: Diagnosis not present

## 2016-08-31 DIAGNOSIS — G4733 Obstructive sleep apnea (adult) (pediatric): Secondary | ICD-10-CM | POA: Diagnosis not present

## 2016-08-31 DIAGNOSIS — J449 Chronic obstructive pulmonary disease, unspecified: Secondary | ICD-10-CM | POA: Diagnosis not present

## 2016-09-01 DIAGNOSIS — S71101D Unspecified open wound, right thigh, subsequent encounter: Secondary | ICD-10-CM | POA: Diagnosis not present

## 2016-09-01 DIAGNOSIS — E1122 Type 2 diabetes mellitus with diabetic chronic kidney disease: Secondary | ICD-10-CM | POA: Diagnosis not present

## 2016-09-01 DIAGNOSIS — I87302 Chronic venous hypertension (idiopathic) without complications of left lower extremity: Secondary | ICD-10-CM | POA: Diagnosis not present

## 2016-09-01 DIAGNOSIS — Z9981 Dependence on supplemental oxygen: Secondary | ICD-10-CM | POA: Diagnosis not present

## 2016-09-01 DIAGNOSIS — I890 Lymphedema, not elsewhere classified: Secondary | ICD-10-CM | POA: Diagnosis not present

## 2016-09-01 DIAGNOSIS — Z7982 Long term (current) use of aspirin: Secondary | ICD-10-CM | POA: Diagnosis not present

## 2016-09-01 DIAGNOSIS — J9611 Chronic respiratory failure with hypoxia: Secondary | ICD-10-CM | POA: Diagnosis not present

## 2016-09-01 DIAGNOSIS — J449 Chronic obstructive pulmonary disease, unspecified: Secondary | ICD-10-CM | POA: Diagnosis not present

## 2016-09-01 DIAGNOSIS — G4733 Obstructive sleep apnea (adult) (pediatric): Secondary | ICD-10-CM | POA: Diagnosis not present

## 2016-09-01 DIAGNOSIS — Z7902 Long term (current) use of antithrombotics/antiplatelets: Secondary | ICD-10-CM | POA: Diagnosis not present

## 2016-09-01 DIAGNOSIS — E1159 Type 2 diabetes mellitus with other circulatory complications: Secondary | ICD-10-CM | POA: Diagnosis not present

## 2016-09-01 DIAGNOSIS — M25551 Pain in right hip: Secondary | ICD-10-CM | POA: Diagnosis not present

## 2016-09-01 DIAGNOSIS — I5022 Chronic systolic (congestive) heart failure: Secondary | ICD-10-CM | POA: Diagnosis not present

## 2016-09-01 DIAGNOSIS — N189 Chronic kidney disease, unspecified: Secondary | ICD-10-CM | POA: Diagnosis not present

## 2016-09-02 DIAGNOSIS — Z7902 Long term (current) use of antithrombotics/antiplatelets: Secondary | ICD-10-CM | POA: Diagnosis not present

## 2016-09-02 DIAGNOSIS — J9611 Chronic respiratory failure with hypoxia: Secondary | ICD-10-CM | POA: Diagnosis not present

## 2016-09-02 DIAGNOSIS — I890 Lymphedema, not elsewhere classified: Secondary | ICD-10-CM | POA: Diagnosis not present

## 2016-09-02 DIAGNOSIS — E1122 Type 2 diabetes mellitus with diabetic chronic kidney disease: Secondary | ICD-10-CM | POA: Diagnosis not present

## 2016-09-02 DIAGNOSIS — E1159 Type 2 diabetes mellitus with other circulatory complications: Secondary | ICD-10-CM | POA: Diagnosis not present

## 2016-09-02 DIAGNOSIS — Z9981 Dependence on supplemental oxygen: Secondary | ICD-10-CM | POA: Diagnosis not present

## 2016-09-02 DIAGNOSIS — N189 Chronic kidney disease, unspecified: Secondary | ICD-10-CM | POA: Diagnosis not present

## 2016-09-02 DIAGNOSIS — M25551 Pain in right hip: Secondary | ICD-10-CM | POA: Diagnosis not present

## 2016-09-02 DIAGNOSIS — S71101D Unspecified open wound, right thigh, subsequent encounter: Secondary | ICD-10-CM | POA: Diagnosis not present

## 2016-09-02 DIAGNOSIS — G4733 Obstructive sleep apnea (adult) (pediatric): Secondary | ICD-10-CM | POA: Diagnosis not present

## 2016-09-02 DIAGNOSIS — I5022 Chronic systolic (congestive) heart failure: Secondary | ICD-10-CM | POA: Diagnosis not present

## 2016-09-02 DIAGNOSIS — I87302 Chronic venous hypertension (idiopathic) without complications of left lower extremity: Secondary | ICD-10-CM | POA: Diagnosis not present

## 2016-09-02 DIAGNOSIS — Z7982 Long term (current) use of aspirin: Secondary | ICD-10-CM | POA: Diagnosis not present

## 2016-09-02 DIAGNOSIS — J449 Chronic obstructive pulmonary disease, unspecified: Secondary | ICD-10-CM | POA: Diagnosis not present

## 2016-09-06 DIAGNOSIS — G4733 Obstructive sleep apnea (adult) (pediatric): Secondary | ICD-10-CM | POA: Diagnosis not present

## 2016-09-06 DIAGNOSIS — S71101D Unspecified open wound, right thigh, subsequent encounter: Secondary | ICD-10-CM | POA: Diagnosis not present

## 2016-09-06 DIAGNOSIS — N189 Chronic kidney disease, unspecified: Secondary | ICD-10-CM | POA: Diagnosis not present

## 2016-09-06 DIAGNOSIS — I87302 Chronic venous hypertension (idiopathic) without complications of left lower extremity: Secondary | ICD-10-CM | POA: Diagnosis not present

## 2016-09-06 DIAGNOSIS — Z7982 Long term (current) use of aspirin: Secondary | ICD-10-CM | POA: Diagnosis not present

## 2016-09-06 DIAGNOSIS — E1122 Type 2 diabetes mellitus with diabetic chronic kidney disease: Secondary | ICD-10-CM | POA: Diagnosis not present

## 2016-09-06 DIAGNOSIS — E1159 Type 2 diabetes mellitus with other circulatory complications: Secondary | ICD-10-CM | POA: Diagnosis not present

## 2016-09-06 DIAGNOSIS — I890 Lymphedema, not elsewhere classified: Secondary | ICD-10-CM | POA: Diagnosis not present

## 2016-09-06 DIAGNOSIS — J9611 Chronic respiratory failure with hypoxia: Secondary | ICD-10-CM | POA: Diagnosis not present

## 2016-09-06 DIAGNOSIS — M25551 Pain in right hip: Secondary | ICD-10-CM | POA: Diagnosis not present

## 2016-09-06 DIAGNOSIS — J449 Chronic obstructive pulmonary disease, unspecified: Secondary | ICD-10-CM | POA: Diagnosis not present

## 2016-09-06 DIAGNOSIS — I5022 Chronic systolic (congestive) heart failure: Secondary | ICD-10-CM | POA: Diagnosis not present

## 2016-09-07 DIAGNOSIS — E1122 Type 2 diabetes mellitus with diabetic chronic kidney disease: Secondary | ICD-10-CM | POA: Diagnosis not present

## 2016-09-07 DIAGNOSIS — E1159 Type 2 diabetes mellitus with other circulatory complications: Secondary | ICD-10-CM | POA: Diagnosis not present

## 2016-09-07 DIAGNOSIS — Z7982 Long term (current) use of aspirin: Secondary | ICD-10-CM | POA: Diagnosis not present

## 2016-09-07 DIAGNOSIS — N189 Chronic kidney disease, unspecified: Secondary | ICD-10-CM | POA: Diagnosis not present

## 2016-09-07 DIAGNOSIS — M25551 Pain in right hip: Secondary | ICD-10-CM | POA: Diagnosis not present

## 2016-09-07 DIAGNOSIS — J449 Chronic obstructive pulmonary disease, unspecified: Secondary | ICD-10-CM | POA: Diagnosis not present

## 2016-09-07 DIAGNOSIS — G4733 Obstructive sleep apnea (adult) (pediatric): Secondary | ICD-10-CM | POA: Diagnosis not present

## 2016-09-07 DIAGNOSIS — I87302 Chronic venous hypertension (idiopathic) without complications of left lower extremity: Secondary | ICD-10-CM | POA: Diagnosis not present

## 2016-09-07 DIAGNOSIS — S71101D Unspecified open wound, right thigh, subsequent encounter: Secondary | ICD-10-CM | POA: Diagnosis not present

## 2016-09-07 DIAGNOSIS — I5022 Chronic systolic (congestive) heart failure: Secondary | ICD-10-CM | POA: Diagnosis not present

## 2016-09-07 DIAGNOSIS — J9611 Chronic respiratory failure with hypoxia: Secondary | ICD-10-CM | POA: Diagnosis not present

## 2016-09-07 DIAGNOSIS — I890 Lymphedema, not elsewhere classified: Secondary | ICD-10-CM | POA: Diagnosis not present

## 2016-09-08 DIAGNOSIS — I87302 Chronic venous hypertension (idiopathic) without complications of left lower extremity: Secondary | ICD-10-CM | POA: Diagnosis not present

## 2016-09-08 DIAGNOSIS — S71101D Unspecified open wound, right thigh, subsequent encounter: Secondary | ICD-10-CM | POA: Diagnosis not present

## 2016-09-08 DIAGNOSIS — J9611 Chronic respiratory failure with hypoxia: Secondary | ICD-10-CM | POA: Diagnosis not present

## 2016-09-08 DIAGNOSIS — I890 Lymphedema, not elsewhere classified: Secondary | ICD-10-CM | POA: Diagnosis not present

## 2016-09-08 DIAGNOSIS — Z7982 Long term (current) use of aspirin: Secondary | ICD-10-CM | POA: Diagnosis not present

## 2016-09-08 DIAGNOSIS — I5022 Chronic systolic (congestive) heart failure: Secondary | ICD-10-CM | POA: Diagnosis not present

## 2016-09-08 DIAGNOSIS — N189 Chronic kidney disease, unspecified: Secondary | ICD-10-CM | POA: Diagnosis not present

## 2016-09-08 DIAGNOSIS — G4733 Obstructive sleep apnea (adult) (pediatric): Secondary | ICD-10-CM | POA: Diagnosis not present

## 2016-09-08 DIAGNOSIS — E1159 Type 2 diabetes mellitus with other circulatory complications: Secondary | ICD-10-CM | POA: Diagnosis not present

## 2016-09-08 DIAGNOSIS — J449 Chronic obstructive pulmonary disease, unspecified: Secondary | ICD-10-CM | POA: Diagnosis not present

## 2016-09-08 DIAGNOSIS — E1122 Type 2 diabetes mellitus with diabetic chronic kidney disease: Secondary | ICD-10-CM | POA: Diagnosis not present

## 2016-09-08 DIAGNOSIS — M25551 Pain in right hip: Secondary | ICD-10-CM | POA: Diagnosis not present

## 2016-09-09 DIAGNOSIS — I5022 Chronic systolic (congestive) heart failure: Secondary | ICD-10-CM | POA: Diagnosis not present

## 2016-09-09 DIAGNOSIS — S71101D Unspecified open wound, right thigh, subsequent encounter: Secondary | ICD-10-CM | POA: Diagnosis not present

## 2016-09-09 DIAGNOSIS — I890 Lymphedema, not elsewhere classified: Secondary | ICD-10-CM | POA: Diagnosis not present

## 2016-09-09 DIAGNOSIS — J9611 Chronic respiratory failure with hypoxia: Secondary | ICD-10-CM | POA: Diagnosis not present

## 2016-09-09 DIAGNOSIS — N189 Chronic kidney disease, unspecified: Secondary | ICD-10-CM | POA: Diagnosis not present

## 2016-09-09 DIAGNOSIS — M25551 Pain in right hip: Secondary | ICD-10-CM | POA: Diagnosis not present

## 2016-09-09 DIAGNOSIS — G4733 Obstructive sleep apnea (adult) (pediatric): Secondary | ICD-10-CM | POA: Diagnosis not present

## 2016-09-09 DIAGNOSIS — E1122 Type 2 diabetes mellitus with diabetic chronic kidney disease: Secondary | ICD-10-CM | POA: Diagnosis not present

## 2016-09-09 DIAGNOSIS — Z7982 Long term (current) use of aspirin: Secondary | ICD-10-CM | POA: Diagnosis not present

## 2016-09-09 DIAGNOSIS — J449 Chronic obstructive pulmonary disease, unspecified: Secondary | ICD-10-CM | POA: Diagnosis not present

## 2016-09-09 DIAGNOSIS — E1159 Type 2 diabetes mellitus with other circulatory complications: Secondary | ICD-10-CM | POA: Diagnosis not present

## 2016-09-09 DIAGNOSIS — R8299 Other abnormal findings in urine: Secondary | ICD-10-CM | POA: Diagnosis not present

## 2016-09-09 DIAGNOSIS — I87302 Chronic venous hypertension (idiopathic) without complications of left lower extremity: Secondary | ICD-10-CM | POA: Diagnosis not present

## 2016-09-11 DIAGNOSIS — I509 Heart failure, unspecified: Secondary | ICD-10-CM | POA: Diagnosis not present

## 2016-09-13 DIAGNOSIS — E1122 Type 2 diabetes mellitus with diabetic chronic kidney disease: Secondary | ICD-10-CM | POA: Diagnosis not present

## 2016-09-13 DIAGNOSIS — Z7982 Long term (current) use of aspirin: Secondary | ICD-10-CM | POA: Diagnosis not present

## 2016-09-13 DIAGNOSIS — I87302 Chronic venous hypertension (idiopathic) without complications of left lower extremity: Secondary | ICD-10-CM | POA: Diagnosis not present

## 2016-09-13 DIAGNOSIS — S71101D Unspecified open wound, right thigh, subsequent encounter: Secondary | ICD-10-CM | POA: Diagnosis not present

## 2016-09-13 DIAGNOSIS — N189 Chronic kidney disease, unspecified: Secondary | ICD-10-CM | POA: Diagnosis not present

## 2016-09-13 DIAGNOSIS — G4733 Obstructive sleep apnea (adult) (pediatric): Secondary | ICD-10-CM | POA: Diagnosis not present

## 2016-09-13 DIAGNOSIS — J449 Chronic obstructive pulmonary disease, unspecified: Secondary | ICD-10-CM | POA: Diagnosis not present

## 2016-09-13 DIAGNOSIS — M25551 Pain in right hip: Secondary | ICD-10-CM | POA: Diagnosis not present

## 2016-09-13 DIAGNOSIS — J9611 Chronic respiratory failure with hypoxia: Secondary | ICD-10-CM | POA: Diagnosis not present

## 2016-09-13 DIAGNOSIS — I890 Lymphedema, not elsewhere classified: Secondary | ICD-10-CM | POA: Diagnosis not present

## 2016-09-13 DIAGNOSIS — I5022 Chronic systolic (congestive) heart failure: Secondary | ICD-10-CM | POA: Diagnosis not present

## 2016-09-13 DIAGNOSIS — E1159 Type 2 diabetes mellitus with other circulatory complications: Secondary | ICD-10-CM | POA: Diagnosis not present

## 2016-09-14 DIAGNOSIS — I87302 Chronic venous hypertension (idiopathic) without complications of left lower extremity: Secondary | ICD-10-CM | POA: Diagnosis not present

## 2016-09-14 DIAGNOSIS — I5022 Chronic systolic (congestive) heart failure: Secondary | ICD-10-CM | POA: Diagnosis not present

## 2016-09-14 DIAGNOSIS — S71101D Unspecified open wound, right thigh, subsequent encounter: Secondary | ICD-10-CM | POA: Diagnosis not present

## 2016-09-14 DIAGNOSIS — N189 Chronic kidney disease, unspecified: Secondary | ICD-10-CM | POA: Diagnosis not present

## 2016-09-14 DIAGNOSIS — G4733 Obstructive sleep apnea (adult) (pediatric): Secondary | ICD-10-CM | POA: Diagnosis not present

## 2016-09-14 DIAGNOSIS — J449 Chronic obstructive pulmonary disease, unspecified: Secondary | ICD-10-CM | POA: Diagnosis not present

## 2016-09-14 DIAGNOSIS — M25551 Pain in right hip: Secondary | ICD-10-CM | POA: Diagnosis not present

## 2016-09-14 DIAGNOSIS — I890 Lymphedema, not elsewhere classified: Secondary | ICD-10-CM | POA: Diagnosis not present

## 2016-09-14 DIAGNOSIS — E1122 Type 2 diabetes mellitus with diabetic chronic kidney disease: Secondary | ICD-10-CM | POA: Diagnosis not present

## 2016-09-14 DIAGNOSIS — Z7982 Long term (current) use of aspirin: Secondary | ICD-10-CM | POA: Diagnosis not present

## 2016-09-14 DIAGNOSIS — E1159 Type 2 diabetes mellitus with other circulatory complications: Secondary | ICD-10-CM | POA: Diagnosis not present

## 2016-09-14 DIAGNOSIS — J9611 Chronic respiratory failure with hypoxia: Secondary | ICD-10-CM | POA: Diagnosis not present

## 2016-09-15 DIAGNOSIS — I87302 Chronic venous hypertension (idiopathic) without complications of left lower extremity: Secondary | ICD-10-CM | POA: Diagnosis not present

## 2016-09-15 DIAGNOSIS — S71101D Unspecified open wound, right thigh, subsequent encounter: Secondary | ICD-10-CM | POA: Diagnosis not present

## 2016-09-15 DIAGNOSIS — I890 Lymphedema, not elsewhere classified: Secondary | ICD-10-CM | POA: Diagnosis not present

## 2016-09-15 DIAGNOSIS — I5022 Chronic systolic (congestive) heart failure: Secondary | ICD-10-CM | POA: Diagnosis not present

## 2016-09-15 DIAGNOSIS — G4733 Obstructive sleep apnea (adult) (pediatric): Secondary | ICD-10-CM | POA: Diagnosis not present

## 2016-09-15 DIAGNOSIS — E1159 Type 2 diabetes mellitus with other circulatory complications: Secondary | ICD-10-CM | POA: Diagnosis not present

## 2016-09-15 DIAGNOSIS — N189 Chronic kidney disease, unspecified: Secondary | ICD-10-CM | POA: Diagnosis not present

## 2016-09-15 DIAGNOSIS — M25551 Pain in right hip: Secondary | ICD-10-CM | POA: Diagnosis not present

## 2016-09-15 DIAGNOSIS — E1122 Type 2 diabetes mellitus with diabetic chronic kidney disease: Secondary | ICD-10-CM | POA: Diagnosis not present

## 2016-09-15 DIAGNOSIS — Z7982 Long term (current) use of aspirin: Secondary | ICD-10-CM | POA: Diagnosis not present

## 2016-09-15 DIAGNOSIS — J449 Chronic obstructive pulmonary disease, unspecified: Secondary | ICD-10-CM | POA: Diagnosis not present

## 2016-09-15 DIAGNOSIS — J9611 Chronic respiratory failure with hypoxia: Secondary | ICD-10-CM | POA: Diagnosis not present

## 2016-09-16 DIAGNOSIS — S71101D Unspecified open wound, right thigh, subsequent encounter: Secondary | ICD-10-CM | POA: Diagnosis not present

## 2016-09-16 DIAGNOSIS — N189 Chronic kidney disease, unspecified: Secondary | ICD-10-CM | POA: Diagnosis not present

## 2016-09-16 DIAGNOSIS — I87302 Chronic venous hypertension (idiopathic) without complications of left lower extremity: Secondary | ICD-10-CM | POA: Diagnosis not present

## 2016-09-16 DIAGNOSIS — M25551 Pain in right hip: Secondary | ICD-10-CM | POA: Diagnosis not present

## 2016-09-16 DIAGNOSIS — G4733 Obstructive sleep apnea (adult) (pediatric): Secondary | ICD-10-CM | POA: Diagnosis not present

## 2016-09-16 DIAGNOSIS — E1122 Type 2 diabetes mellitus with diabetic chronic kidney disease: Secondary | ICD-10-CM | POA: Diagnosis not present

## 2016-09-16 DIAGNOSIS — I890 Lymphedema, not elsewhere classified: Secondary | ICD-10-CM | POA: Diagnosis not present

## 2016-09-16 DIAGNOSIS — Z7982 Long term (current) use of aspirin: Secondary | ICD-10-CM | POA: Diagnosis not present

## 2016-09-16 DIAGNOSIS — E1159 Type 2 diabetes mellitus with other circulatory complications: Secondary | ICD-10-CM | POA: Diagnosis not present

## 2016-09-16 DIAGNOSIS — J9611 Chronic respiratory failure with hypoxia: Secondary | ICD-10-CM | POA: Diagnosis not present

## 2016-09-16 DIAGNOSIS — I5022 Chronic systolic (congestive) heart failure: Secondary | ICD-10-CM | POA: Diagnosis not present

## 2016-09-16 DIAGNOSIS — J449 Chronic obstructive pulmonary disease, unspecified: Secondary | ICD-10-CM | POA: Diagnosis not present

## 2016-09-20 DIAGNOSIS — M25551 Pain in right hip: Secondary | ICD-10-CM | POA: Diagnosis not present

## 2016-09-20 DIAGNOSIS — I87302 Chronic venous hypertension (idiopathic) without complications of left lower extremity: Secondary | ICD-10-CM | POA: Diagnosis not present

## 2016-09-20 DIAGNOSIS — I5022 Chronic systolic (congestive) heart failure: Secondary | ICD-10-CM | POA: Diagnosis not present

## 2016-09-20 DIAGNOSIS — J9611 Chronic respiratory failure with hypoxia: Secondary | ICD-10-CM | POA: Diagnosis not present

## 2016-09-20 DIAGNOSIS — J449 Chronic obstructive pulmonary disease, unspecified: Secondary | ICD-10-CM | POA: Diagnosis not present

## 2016-09-20 DIAGNOSIS — S71101D Unspecified open wound, right thigh, subsequent encounter: Secondary | ICD-10-CM | POA: Diagnosis not present

## 2016-09-20 DIAGNOSIS — Z7982 Long term (current) use of aspirin: Secondary | ICD-10-CM | POA: Diagnosis not present

## 2016-09-20 DIAGNOSIS — N189 Chronic kidney disease, unspecified: Secondary | ICD-10-CM | POA: Diagnosis not present

## 2016-09-20 DIAGNOSIS — E1122 Type 2 diabetes mellitus with diabetic chronic kidney disease: Secondary | ICD-10-CM | POA: Diagnosis not present

## 2016-09-20 DIAGNOSIS — E1159 Type 2 diabetes mellitus with other circulatory complications: Secondary | ICD-10-CM | POA: Diagnosis not present

## 2016-09-20 DIAGNOSIS — I890 Lymphedema, not elsewhere classified: Secondary | ICD-10-CM | POA: Diagnosis not present

## 2016-09-20 DIAGNOSIS — G4733 Obstructive sleep apnea (adult) (pediatric): Secondary | ICD-10-CM | POA: Diagnosis not present

## 2016-09-22 DIAGNOSIS — M25551 Pain in right hip: Secondary | ICD-10-CM | POA: Diagnosis not present

## 2016-09-22 DIAGNOSIS — S71101D Unspecified open wound, right thigh, subsequent encounter: Secondary | ICD-10-CM | POA: Diagnosis not present

## 2016-09-22 DIAGNOSIS — I87302 Chronic venous hypertension (idiopathic) without complications of left lower extremity: Secondary | ICD-10-CM | POA: Diagnosis not present

## 2016-09-22 DIAGNOSIS — Z7982 Long term (current) use of aspirin: Secondary | ICD-10-CM | POA: Diagnosis not present

## 2016-09-22 DIAGNOSIS — J449 Chronic obstructive pulmonary disease, unspecified: Secondary | ICD-10-CM | POA: Diagnosis not present

## 2016-09-22 DIAGNOSIS — I5022 Chronic systolic (congestive) heart failure: Secondary | ICD-10-CM | POA: Diagnosis not present

## 2016-09-22 DIAGNOSIS — N189 Chronic kidney disease, unspecified: Secondary | ICD-10-CM | POA: Diagnosis not present

## 2016-09-22 DIAGNOSIS — E1122 Type 2 diabetes mellitus with diabetic chronic kidney disease: Secondary | ICD-10-CM | POA: Diagnosis not present

## 2016-09-22 DIAGNOSIS — G4733 Obstructive sleep apnea (adult) (pediatric): Secondary | ICD-10-CM | POA: Diagnosis not present

## 2016-09-22 DIAGNOSIS — J9611 Chronic respiratory failure with hypoxia: Secondary | ICD-10-CM | POA: Diagnosis not present

## 2016-09-22 DIAGNOSIS — E1159 Type 2 diabetes mellitus with other circulatory complications: Secondary | ICD-10-CM | POA: Diagnosis not present

## 2016-09-22 DIAGNOSIS — I890 Lymphedema, not elsewhere classified: Secondary | ICD-10-CM | POA: Diagnosis not present

## 2016-09-23 DIAGNOSIS — I5022 Chronic systolic (congestive) heart failure: Secondary | ICD-10-CM | POA: Diagnosis not present

## 2016-09-23 DIAGNOSIS — G4733 Obstructive sleep apnea (adult) (pediatric): Secondary | ICD-10-CM | POA: Diagnosis not present

## 2016-09-23 DIAGNOSIS — J449 Chronic obstructive pulmonary disease, unspecified: Secondary | ICD-10-CM | POA: Diagnosis not present

## 2016-09-23 DIAGNOSIS — S71101D Unspecified open wound, right thigh, subsequent encounter: Secondary | ICD-10-CM | POA: Diagnosis not present

## 2016-09-23 DIAGNOSIS — E1122 Type 2 diabetes mellitus with diabetic chronic kidney disease: Secondary | ICD-10-CM | POA: Diagnosis not present

## 2016-09-23 DIAGNOSIS — I890 Lymphedema, not elsewhere classified: Secondary | ICD-10-CM | POA: Diagnosis not present

## 2016-09-23 DIAGNOSIS — I87302 Chronic venous hypertension (idiopathic) without complications of left lower extremity: Secondary | ICD-10-CM | POA: Diagnosis not present

## 2016-09-23 DIAGNOSIS — J9611 Chronic respiratory failure with hypoxia: Secondary | ICD-10-CM | POA: Diagnosis not present

## 2016-09-23 DIAGNOSIS — E1159 Type 2 diabetes mellitus with other circulatory complications: Secondary | ICD-10-CM | POA: Diagnosis not present

## 2016-09-23 DIAGNOSIS — N189 Chronic kidney disease, unspecified: Secondary | ICD-10-CM | POA: Diagnosis not present

## 2016-09-23 DIAGNOSIS — Z7982 Long term (current) use of aspirin: Secondary | ICD-10-CM | POA: Diagnosis not present

## 2016-09-23 DIAGNOSIS — M25551 Pain in right hip: Secondary | ICD-10-CM | POA: Diagnosis not present

## 2016-09-24 DIAGNOSIS — M160 Bilateral primary osteoarthritis of hip: Secondary | ICD-10-CM | POA: Diagnosis not present

## 2016-09-27 DIAGNOSIS — M25551 Pain in right hip: Secondary | ICD-10-CM | POA: Diagnosis not present

## 2016-09-27 DIAGNOSIS — J9611 Chronic respiratory failure with hypoxia: Secondary | ICD-10-CM | POA: Diagnosis not present

## 2016-09-27 DIAGNOSIS — I890 Lymphedema, not elsewhere classified: Secondary | ICD-10-CM | POA: Diagnosis not present

## 2016-09-27 DIAGNOSIS — G4733 Obstructive sleep apnea (adult) (pediatric): Secondary | ICD-10-CM | POA: Diagnosis not present

## 2016-09-27 DIAGNOSIS — I87302 Chronic venous hypertension (idiopathic) without complications of left lower extremity: Secondary | ICD-10-CM | POA: Diagnosis not present

## 2016-09-27 DIAGNOSIS — J449 Chronic obstructive pulmonary disease, unspecified: Secondary | ICD-10-CM | POA: Diagnosis not present

## 2016-09-27 DIAGNOSIS — E1122 Type 2 diabetes mellitus with diabetic chronic kidney disease: Secondary | ICD-10-CM | POA: Diagnosis not present

## 2016-09-27 DIAGNOSIS — N189 Chronic kidney disease, unspecified: Secondary | ICD-10-CM | POA: Diagnosis not present

## 2016-09-27 DIAGNOSIS — I5022 Chronic systolic (congestive) heart failure: Secondary | ICD-10-CM | POA: Diagnosis not present

## 2016-09-27 DIAGNOSIS — E1159 Type 2 diabetes mellitus with other circulatory complications: Secondary | ICD-10-CM | POA: Diagnosis not present

## 2016-09-27 DIAGNOSIS — S71101D Unspecified open wound, right thigh, subsequent encounter: Secondary | ICD-10-CM | POA: Diagnosis not present

## 2016-09-27 DIAGNOSIS — Z7982 Long term (current) use of aspirin: Secondary | ICD-10-CM | POA: Diagnosis not present

## 2016-09-28 DIAGNOSIS — J9611 Chronic respiratory failure with hypoxia: Secondary | ICD-10-CM | POA: Diagnosis not present

## 2016-09-28 DIAGNOSIS — N189 Chronic kidney disease, unspecified: Secondary | ICD-10-CM | POA: Diagnosis not present

## 2016-09-28 DIAGNOSIS — I87302 Chronic venous hypertension (idiopathic) without complications of left lower extremity: Secondary | ICD-10-CM | POA: Diagnosis not present

## 2016-09-28 DIAGNOSIS — J449 Chronic obstructive pulmonary disease, unspecified: Secondary | ICD-10-CM | POA: Diagnosis not present

## 2016-09-28 DIAGNOSIS — M25551 Pain in right hip: Secondary | ICD-10-CM | POA: Diagnosis not present

## 2016-09-28 DIAGNOSIS — I5022 Chronic systolic (congestive) heart failure: Secondary | ICD-10-CM | POA: Diagnosis not present

## 2016-09-28 DIAGNOSIS — G4733 Obstructive sleep apnea (adult) (pediatric): Secondary | ICD-10-CM | POA: Diagnosis not present

## 2016-09-28 DIAGNOSIS — E1159 Type 2 diabetes mellitus with other circulatory complications: Secondary | ICD-10-CM | POA: Diagnosis not present

## 2016-09-28 DIAGNOSIS — I890 Lymphedema, not elsewhere classified: Secondary | ICD-10-CM | POA: Diagnosis not present

## 2016-09-28 DIAGNOSIS — Z7982 Long term (current) use of aspirin: Secondary | ICD-10-CM | POA: Diagnosis not present

## 2016-09-28 DIAGNOSIS — S71101D Unspecified open wound, right thigh, subsequent encounter: Secondary | ICD-10-CM | POA: Diagnosis not present

## 2016-09-28 DIAGNOSIS — E1122 Type 2 diabetes mellitus with diabetic chronic kidney disease: Secondary | ICD-10-CM | POA: Diagnosis not present

## 2016-09-29 DIAGNOSIS — N189 Chronic kidney disease, unspecified: Secondary | ICD-10-CM | POA: Diagnosis not present

## 2016-09-29 DIAGNOSIS — G4733 Obstructive sleep apnea (adult) (pediatric): Secondary | ICD-10-CM | POA: Diagnosis not present

## 2016-09-29 DIAGNOSIS — I890 Lymphedema, not elsewhere classified: Secondary | ICD-10-CM | POA: Diagnosis not present

## 2016-09-29 DIAGNOSIS — J449 Chronic obstructive pulmonary disease, unspecified: Secondary | ICD-10-CM | POA: Diagnosis not present

## 2016-09-29 DIAGNOSIS — S71101D Unspecified open wound, right thigh, subsequent encounter: Secondary | ICD-10-CM | POA: Diagnosis not present

## 2016-09-29 DIAGNOSIS — Z7982 Long term (current) use of aspirin: Secondary | ICD-10-CM | POA: Diagnosis not present

## 2016-09-29 DIAGNOSIS — E1122 Type 2 diabetes mellitus with diabetic chronic kidney disease: Secondary | ICD-10-CM | POA: Diagnosis not present

## 2016-09-29 DIAGNOSIS — J9611 Chronic respiratory failure with hypoxia: Secondary | ICD-10-CM | POA: Diagnosis not present

## 2016-09-29 DIAGNOSIS — E1159 Type 2 diabetes mellitus with other circulatory complications: Secondary | ICD-10-CM | POA: Diagnosis not present

## 2016-09-29 DIAGNOSIS — I87302 Chronic venous hypertension (idiopathic) without complications of left lower extremity: Secondary | ICD-10-CM | POA: Diagnosis not present

## 2016-09-29 DIAGNOSIS — I5022 Chronic systolic (congestive) heart failure: Secondary | ICD-10-CM | POA: Diagnosis not present

## 2016-09-29 DIAGNOSIS — M25551 Pain in right hip: Secondary | ICD-10-CM | POA: Diagnosis not present

## 2016-10-01 DIAGNOSIS — Z7982 Long term (current) use of aspirin: Secondary | ICD-10-CM | POA: Diagnosis not present

## 2016-10-01 DIAGNOSIS — E1122 Type 2 diabetes mellitus with diabetic chronic kidney disease: Secondary | ICD-10-CM | POA: Diagnosis not present

## 2016-10-01 DIAGNOSIS — J449 Chronic obstructive pulmonary disease, unspecified: Secondary | ICD-10-CM | POA: Diagnosis not present

## 2016-10-01 DIAGNOSIS — I890 Lymphedema, not elsewhere classified: Secondary | ICD-10-CM | POA: Diagnosis not present

## 2016-10-01 DIAGNOSIS — S71101D Unspecified open wound, right thigh, subsequent encounter: Secondary | ICD-10-CM | POA: Diagnosis not present

## 2016-10-01 DIAGNOSIS — N189 Chronic kidney disease, unspecified: Secondary | ICD-10-CM | POA: Diagnosis not present

## 2016-10-01 DIAGNOSIS — G4733 Obstructive sleep apnea (adult) (pediatric): Secondary | ICD-10-CM | POA: Diagnosis not present

## 2016-10-01 DIAGNOSIS — E1159 Type 2 diabetes mellitus with other circulatory complications: Secondary | ICD-10-CM | POA: Diagnosis not present

## 2016-10-01 DIAGNOSIS — M25551 Pain in right hip: Secondary | ICD-10-CM | POA: Diagnosis not present

## 2016-10-01 DIAGNOSIS — I87302 Chronic venous hypertension (idiopathic) without complications of left lower extremity: Secondary | ICD-10-CM | POA: Diagnosis not present

## 2016-10-01 DIAGNOSIS — I5022 Chronic systolic (congestive) heart failure: Secondary | ICD-10-CM | POA: Diagnosis not present

## 2016-10-01 DIAGNOSIS — J9611 Chronic respiratory failure with hypoxia: Secondary | ICD-10-CM | POA: Diagnosis not present

## 2016-10-05 DIAGNOSIS — M25551 Pain in right hip: Secondary | ICD-10-CM | POA: Diagnosis not present

## 2016-10-05 DIAGNOSIS — E1165 Type 2 diabetes mellitus with hyperglycemia: Secondary | ICD-10-CM | POA: Diagnosis not present

## 2016-10-05 DIAGNOSIS — Z7982 Long term (current) use of aspirin: Secondary | ICD-10-CM | POA: Diagnosis not present

## 2016-10-05 DIAGNOSIS — J449 Chronic obstructive pulmonary disease, unspecified: Secondary | ICD-10-CM | POA: Diagnosis not present

## 2016-10-05 DIAGNOSIS — I10 Essential (primary) hypertension: Secondary | ICD-10-CM | POA: Diagnosis not present

## 2016-10-05 DIAGNOSIS — G4733 Obstructive sleep apnea (adult) (pediatric): Secondary | ICD-10-CM | POA: Diagnosis not present

## 2016-10-05 DIAGNOSIS — D509 Iron deficiency anemia, unspecified: Secondary | ICD-10-CM | POA: Diagnosis not present

## 2016-10-05 DIAGNOSIS — E1159 Type 2 diabetes mellitus with other circulatory complications: Secondary | ICD-10-CM | POA: Diagnosis not present

## 2016-10-05 DIAGNOSIS — I87302 Chronic venous hypertension (idiopathic) without complications of left lower extremity: Secondary | ICD-10-CM | POA: Diagnosis not present

## 2016-10-05 DIAGNOSIS — I5022 Chronic systolic (congestive) heart failure: Secondary | ICD-10-CM | POA: Diagnosis not present

## 2016-10-05 DIAGNOSIS — I890 Lymphedema, not elsewhere classified: Secondary | ICD-10-CM | POA: Diagnosis not present

## 2016-10-05 DIAGNOSIS — E784 Other hyperlipidemia: Secondary | ICD-10-CM | POA: Diagnosis not present

## 2016-10-05 DIAGNOSIS — I158 Other secondary hypertension: Secondary | ICD-10-CM | POA: Diagnosis not present

## 2016-10-05 DIAGNOSIS — N189 Chronic kidney disease, unspecified: Secondary | ICD-10-CM | POA: Diagnosis not present

## 2016-10-05 DIAGNOSIS — E781 Pure hyperglyceridemia: Secondary | ICD-10-CM | POA: Diagnosis not present

## 2016-10-05 DIAGNOSIS — J9611 Chronic respiratory failure with hypoxia: Secondary | ICD-10-CM | POA: Diagnosis not present

## 2016-10-05 DIAGNOSIS — E1122 Type 2 diabetes mellitus with diabetic chronic kidney disease: Secondary | ICD-10-CM | POA: Diagnosis not present

## 2016-10-05 DIAGNOSIS — S71101D Unspecified open wound, right thigh, subsequent encounter: Secondary | ICD-10-CM | POA: Diagnosis not present

## 2016-10-06 DIAGNOSIS — M25551 Pain in right hip: Secondary | ICD-10-CM | POA: Diagnosis not present

## 2016-10-06 DIAGNOSIS — N189 Chronic kidney disease, unspecified: Secondary | ICD-10-CM | POA: Diagnosis not present

## 2016-10-06 DIAGNOSIS — Z7982 Long term (current) use of aspirin: Secondary | ICD-10-CM | POA: Diagnosis not present

## 2016-10-06 DIAGNOSIS — I890 Lymphedema, not elsewhere classified: Secondary | ICD-10-CM | POA: Diagnosis not present

## 2016-10-06 DIAGNOSIS — S71101D Unspecified open wound, right thigh, subsequent encounter: Secondary | ICD-10-CM | POA: Diagnosis not present

## 2016-10-06 DIAGNOSIS — I87302 Chronic venous hypertension (idiopathic) without complications of left lower extremity: Secondary | ICD-10-CM | POA: Diagnosis not present

## 2016-10-06 DIAGNOSIS — I5022 Chronic systolic (congestive) heart failure: Secondary | ICD-10-CM | POA: Diagnosis not present

## 2016-10-06 DIAGNOSIS — G4733 Obstructive sleep apnea (adult) (pediatric): Secondary | ICD-10-CM | POA: Diagnosis not present

## 2016-10-06 DIAGNOSIS — J9611 Chronic respiratory failure with hypoxia: Secondary | ICD-10-CM | POA: Diagnosis not present

## 2016-10-06 DIAGNOSIS — E1159 Type 2 diabetes mellitus with other circulatory complications: Secondary | ICD-10-CM | POA: Diagnosis not present

## 2016-10-06 DIAGNOSIS — J449 Chronic obstructive pulmonary disease, unspecified: Secondary | ICD-10-CM | POA: Diagnosis not present

## 2016-10-06 DIAGNOSIS — E1122 Type 2 diabetes mellitus with diabetic chronic kidney disease: Secondary | ICD-10-CM | POA: Diagnosis not present

## 2016-10-11 DIAGNOSIS — I509 Heart failure, unspecified: Secondary | ICD-10-CM | POA: Diagnosis not present

## 2016-11-11 DIAGNOSIS — I509 Heart failure, unspecified: Secondary | ICD-10-CM | POA: Diagnosis not present

## 2016-12-12 DIAGNOSIS — I509 Heart failure, unspecified: Secondary | ICD-10-CM | POA: Diagnosis not present

## 2017-01-09 DIAGNOSIS — I509 Heart failure, unspecified: Secondary | ICD-10-CM | POA: Diagnosis not present

## 2017-02-09 DIAGNOSIS — I509 Heart failure, unspecified: Secondary | ICD-10-CM | POA: Diagnosis not present

## 2017-02-11 DIAGNOSIS — M129 Arthropathy, unspecified: Secondary | ICD-10-CM | POA: Diagnosis not present

## 2017-02-11 DIAGNOSIS — E119 Type 2 diabetes mellitus without complications: Secondary | ICD-10-CM | POA: Diagnosis not present

## 2017-02-11 DIAGNOSIS — J449 Chronic obstructive pulmonary disease, unspecified: Secondary | ICD-10-CM | POA: Diagnosis not present

## 2017-02-15 DIAGNOSIS — D649 Anemia, unspecified: Secondary | ICD-10-CM | POA: Diagnosis not present

## 2017-02-15 DIAGNOSIS — Z9981 Dependence on supplemental oxygen: Secondary | ICD-10-CM | POA: Diagnosis not present

## 2017-02-15 DIAGNOSIS — I509 Heart failure, unspecified: Secondary | ICD-10-CM | POA: Diagnosis not present

## 2017-02-15 DIAGNOSIS — N400 Enlarged prostate without lower urinary tract symptoms: Secondary | ICD-10-CM | POA: Diagnosis not present

## 2017-02-15 DIAGNOSIS — M150 Primary generalized (osteo)arthritis: Secondary | ICD-10-CM | POA: Diagnosis not present

## 2017-02-15 DIAGNOSIS — Z7902 Long term (current) use of antithrombotics/antiplatelets: Secondary | ICD-10-CM | POA: Diagnosis not present

## 2017-02-15 DIAGNOSIS — E119 Type 2 diabetes mellitus without complications: Secondary | ICD-10-CM | POA: Diagnosis not present

## 2017-02-15 DIAGNOSIS — Z7984 Long term (current) use of oral hypoglycemic drugs: Secondary | ICD-10-CM | POA: Diagnosis not present

## 2017-02-15 DIAGNOSIS — J441 Chronic obstructive pulmonary disease with (acute) exacerbation: Secondary | ICD-10-CM | POA: Diagnosis not present

## 2017-02-15 DIAGNOSIS — I110 Hypertensive heart disease with heart failure: Secondary | ICD-10-CM | POA: Diagnosis not present

## 2017-02-15 DIAGNOSIS — R21 Rash and other nonspecific skin eruption: Secondary | ICD-10-CM | POA: Diagnosis not present

## 2017-02-18 DIAGNOSIS — N400 Enlarged prostate without lower urinary tract symptoms: Secondary | ICD-10-CM | POA: Diagnosis not present

## 2017-02-18 DIAGNOSIS — R21 Rash and other nonspecific skin eruption: Secondary | ICD-10-CM | POA: Diagnosis not present

## 2017-02-18 DIAGNOSIS — J441 Chronic obstructive pulmonary disease with (acute) exacerbation: Secondary | ICD-10-CM | POA: Diagnosis not present

## 2017-02-18 DIAGNOSIS — I509 Heart failure, unspecified: Secondary | ICD-10-CM | POA: Diagnosis not present

## 2017-02-18 DIAGNOSIS — Z9981 Dependence on supplemental oxygen: Secondary | ICD-10-CM | POA: Diagnosis not present

## 2017-02-18 DIAGNOSIS — D649 Anemia, unspecified: Secondary | ICD-10-CM | POA: Diagnosis not present

## 2017-02-18 DIAGNOSIS — Z7902 Long term (current) use of antithrombotics/antiplatelets: Secondary | ICD-10-CM | POA: Diagnosis not present

## 2017-02-18 DIAGNOSIS — I110 Hypertensive heart disease with heart failure: Secondary | ICD-10-CM | POA: Diagnosis not present

## 2017-02-18 DIAGNOSIS — M150 Primary generalized (osteo)arthritis: Secondary | ICD-10-CM | POA: Diagnosis not present

## 2017-02-18 DIAGNOSIS — Z7984 Long term (current) use of oral hypoglycemic drugs: Secondary | ICD-10-CM | POA: Diagnosis not present

## 2017-02-18 DIAGNOSIS — E119 Type 2 diabetes mellitus without complications: Secondary | ICD-10-CM | POA: Diagnosis not present

## 2017-02-22 DIAGNOSIS — J441 Chronic obstructive pulmonary disease with (acute) exacerbation: Secondary | ICD-10-CM | POA: Diagnosis not present

## 2017-02-22 DIAGNOSIS — Z7902 Long term (current) use of antithrombotics/antiplatelets: Secondary | ICD-10-CM | POA: Diagnosis not present

## 2017-02-22 DIAGNOSIS — Z9981 Dependence on supplemental oxygen: Secondary | ICD-10-CM | POA: Diagnosis not present

## 2017-02-22 DIAGNOSIS — I509 Heart failure, unspecified: Secondary | ICD-10-CM | POA: Diagnosis not present

## 2017-02-22 DIAGNOSIS — I110 Hypertensive heart disease with heart failure: Secondary | ICD-10-CM | POA: Diagnosis not present

## 2017-02-22 DIAGNOSIS — R21 Rash and other nonspecific skin eruption: Secondary | ICD-10-CM | POA: Diagnosis not present

## 2017-02-22 DIAGNOSIS — E119 Type 2 diabetes mellitus without complications: Secondary | ICD-10-CM | POA: Diagnosis not present

## 2017-02-22 DIAGNOSIS — D649 Anemia, unspecified: Secondary | ICD-10-CM | POA: Diagnosis not present

## 2017-02-22 DIAGNOSIS — M150 Primary generalized (osteo)arthritis: Secondary | ICD-10-CM | POA: Diagnosis not present

## 2017-02-22 DIAGNOSIS — Z7984 Long term (current) use of oral hypoglycemic drugs: Secondary | ICD-10-CM | POA: Diagnosis not present

## 2017-02-22 DIAGNOSIS — N400 Enlarged prostate without lower urinary tract symptoms: Secondary | ICD-10-CM | POA: Diagnosis not present

## 2017-02-25 DIAGNOSIS — N400 Enlarged prostate without lower urinary tract symptoms: Secondary | ICD-10-CM | POA: Diagnosis not present

## 2017-02-25 DIAGNOSIS — Z7902 Long term (current) use of antithrombotics/antiplatelets: Secondary | ICD-10-CM | POA: Diagnosis not present

## 2017-02-25 DIAGNOSIS — Z9981 Dependence on supplemental oxygen: Secondary | ICD-10-CM | POA: Diagnosis not present

## 2017-02-25 DIAGNOSIS — I110 Hypertensive heart disease with heart failure: Secondary | ICD-10-CM | POA: Diagnosis not present

## 2017-02-25 DIAGNOSIS — R21 Rash and other nonspecific skin eruption: Secondary | ICD-10-CM | POA: Diagnosis not present

## 2017-02-25 DIAGNOSIS — D649 Anemia, unspecified: Secondary | ICD-10-CM | POA: Diagnosis not present

## 2017-02-25 DIAGNOSIS — J441 Chronic obstructive pulmonary disease with (acute) exacerbation: Secondary | ICD-10-CM | POA: Diagnosis not present

## 2017-02-25 DIAGNOSIS — I509 Heart failure, unspecified: Secondary | ICD-10-CM | POA: Diagnosis not present

## 2017-02-25 DIAGNOSIS — E119 Type 2 diabetes mellitus without complications: Secondary | ICD-10-CM | POA: Diagnosis not present

## 2017-02-25 DIAGNOSIS — M150 Primary generalized (osteo)arthritis: Secondary | ICD-10-CM | POA: Diagnosis not present

## 2017-02-25 DIAGNOSIS — Z7984 Long term (current) use of oral hypoglycemic drugs: Secondary | ICD-10-CM | POA: Diagnosis not present

## 2017-03-01 ENCOUNTER — Encounter: Payer: Self-pay | Admitting: Emergency Medicine

## 2017-03-01 ENCOUNTER — Inpatient Hospital Stay
Admission: EM | Admit: 2017-03-01 | Discharge: 2017-03-05 | DRG: 193 | Disposition: A | Discharge status: Home Health | Payer: Medicare Other | Attending: Specialist | Admitting: Specialist

## 2017-03-01 ENCOUNTER — Emergency Department: Payer: Medicare Other

## 2017-03-01 DIAGNOSIS — E1140 Type 2 diabetes mellitus with diabetic neuropathy, unspecified: Secondary | ICD-10-CM | POA: Diagnosis present

## 2017-03-01 DIAGNOSIS — I5030 Unspecified diastolic (congestive) heart failure: Secondary | ICD-10-CM | POA: Diagnosis present

## 2017-03-01 DIAGNOSIS — R079 Chest pain, unspecified: Secondary | ICD-10-CM | POA: Diagnosis not present

## 2017-03-01 DIAGNOSIS — J9601 Acute respiratory failure with hypoxia: Secondary | ICD-10-CM | POA: Diagnosis not present

## 2017-03-01 DIAGNOSIS — I4891 Unspecified atrial fibrillation: Secondary | ICD-10-CM | POA: Diagnosis not present

## 2017-03-01 DIAGNOSIS — E7800 Pure hypercholesterolemia, unspecified: Secondary | ICD-10-CM | POA: Diagnosis present

## 2017-03-01 DIAGNOSIS — J9602 Acute respiratory failure with hypercapnia: Secondary | ICD-10-CM | POA: Diagnosis not present

## 2017-03-01 DIAGNOSIS — E119 Type 2 diabetes mellitus without complications: Secondary | ICD-10-CM

## 2017-03-01 DIAGNOSIS — Z888 Allergy status to other drugs, medicaments and biological substances status: Secondary | ICD-10-CM

## 2017-03-01 DIAGNOSIS — E872 Acidosis: Secondary | ICD-10-CM | POA: Diagnosis present

## 2017-03-01 DIAGNOSIS — R0602 Shortness of breath: Secondary | ICD-10-CM | POA: Diagnosis not present

## 2017-03-01 DIAGNOSIS — J9622 Acute and chronic respiratory failure with hypercapnia: Secondary | ICD-10-CM | POA: Diagnosis present

## 2017-03-01 DIAGNOSIS — I2510 Atherosclerotic heart disease of native coronary artery without angina pectoris: Secondary | ICD-10-CM | POA: Diagnosis present

## 2017-03-01 DIAGNOSIS — I110 Hypertensive heart disease with heart failure: Secondary | ICD-10-CM | POA: Diagnosis present

## 2017-03-01 DIAGNOSIS — N400 Enlarged prostate without lower urinary tract symptoms: Secondary | ICD-10-CM | POA: Diagnosis not present

## 2017-03-01 DIAGNOSIS — J9621 Acute and chronic respiratory failure with hypoxia: Secondary | ICD-10-CM | POA: Diagnosis not present

## 2017-03-01 DIAGNOSIS — I252 Old myocardial infarction: Secondary | ICD-10-CM | POA: Diagnosis not present

## 2017-03-01 DIAGNOSIS — J302 Other seasonal allergic rhinitis: Secondary | ICD-10-CM | POA: Diagnosis present

## 2017-03-01 DIAGNOSIS — M419 Scoliosis, unspecified: Secondary | ICD-10-CM | POA: Diagnosis present

## 2017-03-01 DIAGNOSIS — G4730 Sleep apnea, unspecified: Secondary | ICD-10-CM | POA: Diagnosis not present

## 2017-03-01 DIAGNOSIS — G4733 Obstructive sleep apnea (adult) (pediatric): Secondary | ICD-10-CM | POA: Diagnosis not present

## 2017-03-01 DIAGNOSIS — J189 Pneumonia, unspecified organism: Secondary | ICD-10-CM

## 2017-03-01 DIAGNOSIS — J810 Acute pulmonary edema: Secondary | ICD-10-CM | POA: Diagnosis not present

## 2017-03-01 DIAGNOSIS — A419 Sepsis, unspecified organism: Secondary | ICD-10-CM

## 2017-03-01 DIAGNOSIS — I5033 Acute on chronic diastolic (congestive) heart failure: Secondary | ICD-10-CM | POA: Diagnosis not present

## 2017-03-01 DIAGNOSIS — E662 Morbid (severe) obesity with alveolar hypoventilation: Secondary | ICD-10-CM | POA: Diagnosis present

## 2017-03-01 DIAGNOSIS — I10 Essential (primary) hypertension: Secondary | ICD-10-CM | POA: Diagnosis not present

## 2017-03-01 DIAGNOSIS — Z6841 Body Mass Index (BMI) 40.0 and over, adult: Secondary | ICD-10-CM

## 2017-03-01 DIAGNOSIS — Y95 Nosocomial condition: Secondary | ICD-10-CM | POA: Diagnosis present

## 2017-03-01 DIAGNOSIS — J441 Chronic obstructive pulmonary disease with (acute) exacerbation: Secondary | ICD-10-CM | POA: Diagnosis not present

## 2017-03-01 DIAGNOSIS — Z9981 Dependence on supplemental oxygen: Secondary | ICD-10-CM

## 2017-03-01 DIAGNOSIS — Z7982 Long term (current) use of aspirin: Secondary | ICD-10-CM

## 2017-03-01 DIAGNOSIS — Z8249 Family history of ischemic heart disease and other diseases of the circulatory system: Secondary | ICD-10-CM

## 2017-03-01 DIAGNOSIS — Z7902 Long term (current) use of antithrombotics/antiplatelets: Secondary | ICD-10-CM

## 2017-03-01 DIAGNOSIS — R918 Other nonspecific abnormal finding of lung field: Secondary | ICD-10-CM

## 2017-03-01 DIAGNOSIS — J440 Chronic obstructive pulmonary disease with acute lower respiratory infection: Secondary | ICD-10-CM | POA: Diagnosis present

## 2017-03-01 DIAGNOSIS — J9811 Atelectasis: Secondary | ICD-10-CM | POA: Diagnosis not present

## 2017-03-01 DIAGNOSIS — N179 Acute kidney failure, unspecified: Secondary | ICD-10-CM | POA: Diagnosis present

## 2017-03-01 DIAGNOSIS — Z79891 Long term (current) use of opiate analgesic: Secondary | ICD-10-CM

## 2017-03-01 DIAGNOSIS — R600 Localized edema: Secondary | ICD-10-CM | POA: Diagnosis not present

## 2017-03-01 DIAGNOSIS — J9600 Acute respiratory failure, unspecified whether with hypoxia or hypercapnia: Secondary | ICD-10-CM | POA: Diagnosis present

## 2017-03-01 DIAGNOSIS — Z79899 Other long term (current) drug therapy: Secondary | ICD-10-CM

## 2017-03-01 DIAGNOSIS — Z885 Allergy status to narcotic agent status: Secondary | ICD-10-CM

## 2017-03-01 LAB — URINALYSIS, COMPLETE (UACMP) WITH MICROSCOPIC
Bilirubin Urine: NEGATIVE
Glucose, UA: NEGATIVE mg/dL
Ketones, ur: NEGATIVE mg/dL
Leukocytes, UA: NEGATIVE
Nitrite: POSITIVE — AB
Protein, ur: >=300 mg/dL — AB
SPECIFIC GRAVITY, URINE: 1.017 (ref 1.005–1.030)
pH: 6 (ref 5.0–8.0)

## 2017-03-01 LAB — PROTIME-INR
INR: 1.06
PROTHROMBIN TIME: 13.8 s (ref 11.4–15.2)

## 2017-03-01 LAB — CBC WITH DIFFERENTIAL/PLATELET
BASOS ABS: 0 10*3/uL (ref 0–0.1)
Basophils Relative: 0 %
Eosinophils Absolute: 0 10*3/uL (ref 0–0.7)
Eosinophils Relative: 0 %
HEMATOCRIT: 38.3 % — AB (ref 40.0–52.0)
Hemoglobin: 12.2 g/dL — ABNORMAL LOW (ref 13.0–18.0)
LYMPHS PCT: 5 %
Lymphs Abs: 0.5 10*3/uL — ABNORMAL LOW (ref 1.0–3.6)
MCH: 28.5 pg (ref 26.0–34.0)
MCHC: 32 g/dL (ref 32.0–36.0)
MCV: 89.2 fL (ref 80.0–100.0)
Monocytes Absolute: 0.3 10*3/uL (ref 0.2–1.0)
Monocytes Relative: 4 %
NEUTROS ABS: 8.4 10*3/uL — AB (ref 1.4–6.5)
Neutrophils Relative %: 91 %
Platelets: 132 10*3/uL — ABNORMAL LOW (ref 150–440)
RBC: 4.29 MIL/uL — AB (ref 4.40–5.90)
RDW: 15.1 % — ABNORMAL HIGH (ref 11.5–14.5)
WBC: 9.3 10*3/uL (ref 3.8–10.6)

## 2017-03-01 LAB — COMPREHENSIVE METABOLIC PANEL
ALT: 14 U/L — AB (ref 17–63)
AST: 28 U/L (ref 15–41)
Albumin: 3.4 g/dL — ABNORMAL LOW (ref 3.5–5.0)
Alkaline Phosphatase: 86 U/L (ref 38–126)
Anion gap: 6 (ref 5–15)
BUN: 22 mg/dL — AB (ref 6–20)
CO2: 39 mmol/L — ABNORMAL HIGH (ref 22–32)
CREATININE: 1.46 mg/dL — AB (ref 0.61–1.24)
Calcium: 8.6 mg/dL — ABNORMAL LOW (ref 8.9–10.3)
Chloride: 86 mmol/L — ABNORMAL LOW (ref 101–111)
GFR calc Af Amer: 56 mL/min — ABNORMAL LOW (ref >60)
GFR, EST NON AFRICAN AMERICAN: 49 mL/min — AB (ref >60)
Glucose, Bld: 185 mg/dL — ABNORMAL HIGH (ref 65–99)
Potassium: 3.6 mmol/L (ref 3.5–5.1)
Sodium: 131 mmol/L — ABNORMAL LOW (ref 135–145)
TOTAL PROTEIN: 8.3 g/dL — AB (ref 6.5–8.1)
Total Bilirubin: 1.1 mg/dL (ref 0.3–1.2)

## 2017-03-01 LAB — BLOOD GAS, ARTERIAL
ACID-BASE EXCESS: 12.1 mmol/L — AB (ref 0.0–2.0)
BICARBONATE: 43.4 mmol/L — AB (ref 20.0–28.0)
DELIVERY SYSTEMS: POSITIVE
Expiratory PAP: 8
FIO2: 0.5
Inspiratory PAP: 18
MECHANICAL RATE: 8
O2 Saturation: 84.2 %
PATIENT TEMPERATURE: 37
PCO2 ART: 99 mmHg — AB (ref 32.0–48.0)
PH ART: 7.25 — AB (ref 7.350–7.450)
pO2, Arterial: 57 mmHg — ABNORMAL LOW (ref 83.0–108.0)

## 2017-03-01 LAB — LACTIC ACID, PLASMA
LACTIC ACID, VENOUS: 2.3 mmol/L — AB (ref 0.5–1.9)
Lactic Acid, Venous: 5.4 mmol/L (ref 0.5–1.9)

## 2017-03-01 LAB — GLUCOSE, CAPILLARY
Glucose-Capillary: 136 mg/dL — ABNORMAL HIGH (ref 65–99)
Glucose-Capillary: 148 mg/dL — ABNORMAL HIGH (ref 65–99)

## 2017-03-01 LAB — TROPONIN I

## 2017-03-01 MED ORDER — TAMSULOSIN HCL 0.4 MG PO CAPS
0.4000 mg | ORAL_CAPSULE | Freq: Every day | ORAL | Status: DC
  Administered 2017-03-02 – 2017-03-05 (×4): 0.4 mg via ORAL
  Filled 2017-03-01 (×4): qty 1

## 2017-03-01 MED ORDER — SODIUM CHLORIDE 0.9 % IV SOLN
250.0000 mL | INTRAVENOUS | Status: DC | PRN
Start: 2017-03-01 — End: 2017-03-05
  Administered 2017-03-02: 250 mL via INTRAVENOUS

## 2017-03-01 MED ORDER — SODIUM CHLORIDE 0.9 % IV BOLUS (SEPSIS)
1000.0000 mL | Freq: Once | INTRAVENOUS | Status: AC
  Administered 2017-03-01: 1000 mL via INTRAVENOUS

## 2017-03-01 MED ORDER — IPRATROPIUM-ALBUTEROL 0.5-2.5 (3) MG/3ML IN SOLN
9.0000 mL | Freq: Once | RESPIRATORY_TRACT | Status: AC
Start: 2017-03-01 — End: 2017-03-01
  Administered 2017-03-01: 9 mL via RESPIRATORY_TRACT
  Filled 2017-03-01: qty 9

## 2017-03-01 MED ORDER — ALBUTEROL SULFATE (2.5 MG/3ML) 0.083% IN NEBU
2.5000 mg | INHALATION_SOLUTION | Freq: Once | RESPIRATORY_TRACT | Status: AC
  Administered 2017-03-01: 2.5 mg via RESPIRATORY_TRACT
  Filled 2017-03-01: qty 3

## 2017-03-01 MED ORDER — ENOXAPARIN SODIUM 30 MG/0.3ML ~~LOC~~ SOLN
30.0000 mg | Freq: Two times a day (BID) | SUBCUTANEOUS | Status: DC
  Administered 2017-03-01 – 2017-03-02 (×2): 30 mg via SUBCUTANEOUS
  Filled 2017-03-01 (×2): qty 0.3

## 2017-03-01 MED ORDER — PANTOPRAZOLE SODIUM 40 MG PO TBEC
40.0000 mg | DELAYED_RELEASE_TABLET | Freq: Every day | ORAL | Status: DC
  Administered 2017-03-02 – 2017-03-05 (×4): 40 mg via ORAL
  Filled 2017-03-01 (×4): qty 1

## 2017-03-01 MED ORDER — BUDESONIDE 0.25 MG/2ML IN SUSP
0.2500 mg | Freq: Two times a day (BID) | RESPIRATORY_TRACT | Status: DC
  Administered 2017-03-01 – 2017-03-05 (×8): 0.25 mg via RESPIRATORY_TRACT
  Filled 2017-03-01 (×11): qty 2

## 2017-03-01 MED ORDER — SODIUM CHLORIDE 0.9 % IV BOLUS (SEPSIS)
500.0000 mL | Freq: Once | INTRAVENOUS | Status: AC
  Administered 2017-03-02: 500 mL via INTRAVENOUS

## 2017-03-01 MED ORDER — SODIUM CHLORIDE 0.9% FLUSH
3.0000 mL | Freq: Two times a day (BID) | INTRAVENOUS | Status: DC
  Administered 2017-03-02 – 2017-03-05 (×4): 3 mL via INTRAVENOUS

## 2017-03-01 MED ORDER — VANCOMYCIN HCL IN DEXTROSE 1-5 GM/200ML-% IV SOLN
1000.0000 mg | Freq: Once | INTRAVENOUS | Status: AC
  Administered 2017-03-02: 1000 mg via INTRAVENOUS
  Filled 2017-03-01: qty 200

## 2017-03-01 MED ORDER — METHYLPREDNISOLONE SODIUM SUCC 125 MG IJ SOLR
125.0000 mg | Freq: Once | INTRAMUSCULAR | Status: AC
  Administered 2017-03-01: 125 mg via INTRAVENOUS
  Filled 2017-03-01: qty 2

## 2017-03-01 MED ORDER — ACETAMINOPHEN 650 MG RE SUPP: 650.0000 mg | Freq: Four times a day (QID) | RECTAL | Status: DC | PRN

## 2017-03-01 MED ORDER — SODIUM CHLORIDE 0.9% FLUSH
3.0000 mL | Freq: Two times a day (BID) | INTRAVENOUS | Status: DC
Start: 2017-03-01 — End: 2017-03-05
  Administered 2017-03-03 – 2017-03-05 (×3): 3 mL via INTRAVENOUS

## 2017-03-01 MED ORDER — ROSUVASTATIN CALCIUM 5 MG PO TABS
10.0000 mg | ORAL_TABLET | Freq: Every day | ORAL | Status: DC
  Administered 2017-03-02 – 2017-03-04 (×3): 10 mg via ORAL
  Filled 2017-03-01 (×3): qty 2

## 2017-03-01 MED ORDER — CLOPIDOGREL BISULFATE 75 MG PO TABS
75.0000 mg | ORAL_TABLET | Freq: Every day | ORAL | Status: DC
  Administered 2017-03-02 – 2017-03-05 (×4): 75 mg via ORAL
  Filled 2017-03-01 (×4): qty 1

## 2017-03-01 MED ORDER — VANCOMYCIN HCL IN DEXTROSE 1-5 GM/200ML-% IV SOLN
1000.0000 mg | Freq: Once | INTRAVENOUS | Status: AC
  Administered 2017-03-01: 1000 mg via INTRAVENOUS
  Filled 2017-03-01: qty 200

## 2017-03-01 MED ORDER — CEFEPIME-DEXTROSE 1 GM/50ML IV SOLR
INTRAVENOUS | Status: AC
  Filled 2017-03-01: qty 50

## 2017-03-01 MED ORDER — METHYLPREDNISOLONE SODIUM SUCC 125 MG IJ SOLR
60.0000 mg | Freq: Four times a day (QID) | INTRAMUSCULAR | Status: DC
  Administered 2017-03-02: 60 mg via INTRAVENOUS
  Filled 2017-03-01: qty 2

## 2017-03-01 MED ORDER — LIRAGLUTIDE 18 MG/3ML ~~LOC~~ SOPN: 1.8000 mg | PEN_INJECTOR | Freq: Every day | SUBCUTANEOUS | Status: DC

## 2017-03-01 MED ORDER — ENOXAPARIN SODIUM 40 MG/0.4ML ~~LOC~~ SOLN
40.0000 mg | SUBCUTANEOUS | Status: DC
  Filled 2017-03-01: qty 0.4

## 2017-03-01 MED ORDER — CEFEPIME HCL 2 G IJ SOLR: 2.0000 g | Freq: Once | INTRAMUSCULAR | Status: DC

## 2017-03-01 MED ORDER — DEXTROSE 5 % IV SOLN
2.0000 g | Freq: Once | INTRAVENOUS | Status: AC
  Administered 2017-03-01: 2 g via INTRAVENOUS
  Filled 2017-03-01: qty 2

## 2017-03-01 MED ORDER — SODIUM CHLORIDE 0.9% FLUSH: 3.0000 mL | INTRAVENOUS | Status: DC | PRN

## 2017-03-01 MED ORDER — ASPIRIN EC 81 MG PO TBEC
81.0000 mg | DELAYED_RELEASE_TABLET | Freq: Every day | ORAL | Status: DC
  Administered 2017-03-02 – 2017-03-05 (×4): 81 mg via ORAL
  Filled 2017-03-01 (×4): qty 1

## 2017-03-01 MED ORDER — ACETAMINOPHEN 325 MG PO TABS
650.0000 mg | ORAL_TABLET | Freq: Four times a day (QID) | ORAL | Status: DC | PRN
  Administered 2017-03-02: 650 mg via ORAL
  Filled 2017-03-01: qty 2

## 2017-03-01 MED ORDER — IPRATROPIUM-ALBUTEROL 0.5-2.5 (3) MG/3ML IN SOLN
3.0000 mL | Freq: Four times a day (QID) | RESPIRATORY_TRACT | Status: DC
  Administered 2017-03-01 – 2017-03-05 (×16): 3 mL via RESPIRATORY_TRACT
  Filled 2017-03-01 (×16): qty 3

## 2017-03-01 MED ORDER — DEXTROSE 5 % IV SOLN
2.0000 g | Freq: Three times a day (TID) | INTRAVENOUS | Status: DC
  Administered 2017-03-02: 2 g via INTRAVENOUS
  Filled 2017-03-01 (×3): qty 2

## 2017-03-01 MED ORDER — LEVOFLOXACIN IN D5W 500 MG/100ML IV SOLN
500.0000 mg | INTRAVENOUS | Status: DC
  Administered 2017-03-01 – 2017-03-03 (×3): 500 mg via INTRAVENOUS
  Filled 2017-03-01 (×4): qty 100

## 2017-03-01 NOTE — H&P (Signed)
[employer] - [place] at [facility]   PATIENT NAME: [name]    MR#:  [id number]  DATE OF BIRTH:  11/14/1950  DATE OF ADMISSION:  03/01/2017  PRIMARY CARE PHYSICIAN: Sherrie Mustache   REQUESTING/REFERRING PHYSICIAN: Ortencia Kick MD  CHIEF COMPLAINT:   Chief Complaint  Patient presents with  . Shortness of Breath    HISTORY OF PRESENT ILLNESS: [name]  is a 66 y.o. male with a known history of COPD, chronic respiratory failure on 2 L oxygen, essential hypertension, hyperlipidemia, sleep apnea presenting to the emergency room with 1 week of shortness of breath. Patient when he arrived in the ER was having difficulty with breathing and had to be placed on BiPAP. He remains on BiPAP currently. But able to answer questions. He reports that he has not had any fevers or chills denies any cough. He reports that he has been compliant with his CPAP machine at nighttime. PAST MEDICAL HISTORY:   Past Medical History:  Diagnosis Date  . Asthma   . COPD (chronic obstructive pulmonary disease) (HCC)   . Heart attack (HCC)   . High blood pressure   . High cholesterol   . Scoliosis   . Seasonal allergies   . Sleep apnea     PAST SURGICAL HISTORY: Past Surgical History:  Procedure Laterality Date  . back sx     L4 L5   . CARPAL TUNNEL RELEASE Right     SOCIAL HISTORY:  Social History  Substance Use Topics  . Smoking status: Never Smoker  . Smokeless tobacco: Never Used  . Alcohol use No    FAMILY HISTORY:  Family History  Problem Relation Age of Onset  . Heart disease Father   . Asthma Father   . Cancer Father   . Heart disease Mother   . Arthritis Mother   . Macular degeneration Brother     DRUG ALLERGIES:  Allergies  Allergen Reactions  . Metformin And Related Other (See Comments)    "shakes" per pt  . Morphine And Related Other (See Comments)    Hallucinations, sweats    REVIEW OF SYSTEMS:   CONSTITUTIONAL: No fever, fatigue or weakness.   EYES: No blurred or double vision.  EARS, NOSE, AND THROAT: No tinnitus or ear pain.  RESPIRATORY: No cough,Positive shortness of breath, no wheezing or hemoptysis.  CARDIOVASCULAR: No chest pain, orthopnea, edema.  GASTROINTESTINAL: No nausea, vomiting, diarrhea or abdominal pain.  GENITOURINARY: No dysuria, hematuria.  ENDOCRINE: No polyuria, nocturia,  HEMATOLOGY: No anemia, easy bruising or bleeding SKIN: No rash or lesion. MUSCULOSKELETAL: No joint pain or arthritis. Chronic lower extremity swelling and erythema  NEUROLOGIC: No tingling, numbness, weakness.  PSYCHIATRY: No anxiety or depression.   MEDICATIONS AT HOME:  Prior to Admission medications   Medication Sig Start Date End Date Taking? Authorizing Provider  amoxicillin-clavulanate (AUGMENTIN) 875-125 MG tablet Take 1 tablet by mouth 2 (two) times daily. Patient not taking: Reported on 05/06/2016 04/20/16   Milagros Loll, MD  aspirin EC 81 MG tablet Take 81 mg by mouth daily.    Historical Provider, MD  celecoxib (CELEBREX) 200 MG capsule Take 200 mg by mouth daily.    Historical Provider, MD  clopidogrel (PLAVIX) 75 MG tablet Take 75 mg by mouth daily.    Historical Provider, MD  dexlansoprazole (DEXILANT) 60 MG capsule Take 60 mg by mouth daily.    Historical Provider, MD  furosemide (LASIX) 40 MG tablet Take 40 mg by mouth 2 (two)  times daily.     Historical Provider, MD  glipiZIDE (GLUCOTROL XL) 5 MG 24 hr tablet Take 5 mg by mouth 2 (two) times daily.    Historical Provider, MD  Liraglutide (VICTOZA) 18 MG/3ML SOPN Inject 1.8 mg into the skin daily.    Historical Provider, MD  nebivolol (BYSTOLIC) 10 MG tablet Take 10 mg by mouth every morning. *Note dose*    Historical Provider, MD  Nebivolol HCl (BYSTOLIC) 20 MG TABS Take 20 mg by mouth at bedtime. *note dose*    Historical Provider, MD  ondansetron (ZOFRAN ODT) 4 MG disintegrating tablet Take 1 tablet (4 mg total) by mouth every 8 (eight) hours as needed for nausea or  vomiting. 12/21/15   Gayla Doss, MD  oxyCODONE (ROXICODONE) 5 MG immediate release tablet Take 1 tablet (5 mg total) by mouth every 6 (six) hours as needed for moderate pain. Do not drive while taking this medication. Patient not taking: Reported on 05/06/2016 04/20/16   Milagros Loll, MD  OXYGEN Inhale 2 L into the lungs continuous.    Historical Provider, MD  polyethylene glycol powder (GLYCOLAX/MIRALAX) powder Dissolve one heaping tablespoon in 4-8 ounces of juice or water and drink once daily. 12/21/15   Gayla Doss, MD  rosuvastatin (CRESTOR) 10 MG tablet Take 10 mg by mouth daily. Reported on 04/22/2016    Historical Provider, MD  tamsulosin (FLOMAX) 0.4 MG CAPS capsule Take 0.4 mg by mouth daily.    Historical Provider, MD  telmisartan (MICARDIS) 80 MG tablet Take 80 mg by mouth daily.    Historical Provider, MD  traMADol (ULTRAM) 50 MG tablet Take 1 tablet (50 mg total) by mouth every 6 (six) hours as needed. 07/13/16 07/13/17  Joni Reining, PA-C  Vitamin D, Ergocalciferol, (DRISDOL) 50000 units CAPS capsule Take 50,000 Units by mouth every 7 (seven) days.    Historical Provider, MD      PHYSICAL EXAMINATION:   VITAL SIGNS: Blood pressure (!) 112/57, pulse (!) 109, temperature 99.4 F (37.4 C), temperature source Axillary, resp. rate 19, height  (1.753 m), weight (!) 320 lb (145.2 kg), SpO2 98 %.  GENERAL:  66 y.o.-year-old patient lying in the bed with no acute distress.  EYES: Pupils equal, round, reactive to light and accommodation. No scleral icterus. Extraocular muscles intact.  HEENT: Head atraumatic, normocephalic. Oropharynx and nasopharynx clear.  NECK:  Supple, no jugular venous distention. No thyroid enlargement, no tenderness.  LUNGS: Normal breath sounds bilaterally, no wheezing, rales,rhonchi or crepitation. No use of accessory muscles of respiration.  CARDIOVASCULAR: S1, S2 normal. No murmurs, rubs, or gallops.  ABDOMEN: Soft, nontender, nondistended. Bowel sounds  present. No organomegaly or mass.  EXTREMITIES:Chronic lower extremity edema since erythema  NEUROLOGIC: Cranial nerves II through XII are intact. Muscle strength 5/5 in all extremities. Sensation intact. Gait not checked.  PSYCHIATRIC: The patient is alert and oriented x 3.  SKIN: No obvious rash, lesion, or ulcer.   LABORATORY PANEL:   CBC  Recent Labs Lab 03/01/17 2005  WBC 9.3  HGB 12.2*  HCT 38.3*  PLT 132*  MCV 89.2  MCH 28.5  MCHC 32.0  RDW 15.1*  LYMPHSABS 0.5*  MONOABS 0.3  EOSABS 0.0  BASOSABS 0.0   ------------------------------------------------------------------------------------------------------------------  Chemistries   Recent Labs Lab 03/01/17 2005  NA 131*  K 3.6  CL 86*  CO2 39*  GLUCOSE 185*  BUN 22*  CREATININE 1.46*  CALCIUM 8.6*  AST 28  ALT 14*  ALKPHOS 86  BILITOT 1.1   ------------------------------------------------------------------------------------------------------------------ estimated creatinine clearance is 71.7 mL/min (A) (by C-G formula based on SCr of 1.46 mg/dL (H)). ------------------------------------------------------------------------------------------------------------------ No results for input(s): TSH, T4TOTAL, T3FREE, THYROIDAB in the last 72 hours.  Invalid input(s): FREET3   Coagulation profile  Recent Labs Lab 03/01/17 2005  INR 1.06   ------------------------------------------------------------------------------------------------------------------- No results for input(s): DDIMER in the last 72 hours. -------------------------------------------------------------------------------------------------------------------  Cardiac Enzymes  Recent Labs Lab 03/01/17 1840  TROPONINI <0.03   ------------------------------------------------------------------------------------------------------------------ Invalid input(s):  POCBNP  ---------------------------------------------------------------------------------------------------------------  Urinalysis No results found for: COLORURINE, APPEARANCEUR, LABSPEC, PHURINE, GLUCOSEU, HGBUR, BILIRUBINUR, KETONESUR, PROTEINUR, UROBILINOGEN, NITRITE, LEUKOCYTESUR   RADIOLOGY: Dg Chest Port 1 View  Result Date: 03/01/2017 CLINICAL DATA:  66 year old male with history of shortness of breath for 1 week. Diaphoretic. EXAM: PORTABLE CHEST 1 VIEW COMPARISON:  Chest x-ray 04/17/2016. FINDINGS: Lung volumes are low. Bibasilar opacities may reflect areas of atelectasis and/or airspace consolidation. Probable small bilateral pleural effusions. Crowding of the pulmonary vasculature, without frank pulmonary edema. Cardiac silhouette is largely obscured, but appears borderline enlarged. The patient is rotated to the left on today's exam, resulting in distortion of the mediastinal contours and reduced diagnostic sensitivity and specificity for mediastinal pathology. Atherosclerosis in the thoracic aorta. IMPRESSION: 1. Low lung volumes with bibasilar areas of atelectasis and/or airspace consolidation. Probable small bilateral pleural effusions. 2. Aortic atherosclerosis. Electronically Signed   By: Trudie Reed M.D.   On: 03/01/2017 20:05    EKG: Orders placed or performed during the hospital encounter of 03/01/17  . EKG 12-Lead  . EKG 12-Lead  . ED EKG 12-Lead  . ED EKG 12-Lead    IMPRESSION AND PLAN: Patient is a 66 year old presenting with acute respiratory failure  1. Acute hypercarbic respiratory failure This is due to acute on chronic COPD exasperation as well as possible pneumonia We will treat patient with nebulizers, Solu-Medrol Due to concern for possible pneumonia we'll place patient on IV Levaquin I have discussed case with [facility]  2. Diabetes type 2 I will place on sliding scale insulin hold oral regimen  3. Essential hypertension I will hold blood pressure  medications blood pressure not improved  4. BPH will continue Flomax  5. Miscellaneous Lovenox for DVT prophylaxis    All the records are reviewed and case discussed with ED provider. Management plans discussed with the patient, family and they are in agreement.  CODE STATUS: Code Status History    Date Active Date Inactive Code Status Order ID Comments User Context   04/17/2016  3:47 PM 04/20/2016  7:25 PM Full Code [id number]  Marguarite Arbour, MD Inpatient       TOTAL TIME TAKING CARE OF THIS PATIENT: .    Auburn Bilberry M.D on 03/01/2017 at 9:10 PM  Between 7am to 6pm - Pager - [phone]  After 6pm go to [email or web address] - password [email or web address] [facility]  [facility]  Office  [phone]  CC: Primary care physician; Sherrie Mustache

## 2017-03-01 NOTE — Progress Notes (Signed)
ANTICOAGULATION CONSULT NOTE   Lovenox dose adjustment for BMI > 40  Indication: VTE prophylaxis  Allergies  Allergen Reactions  . Metformin And Related Other (See Comments)    "shakes" per pt  . Morphine And Related Other (See Comments)    Hallucinations, sweats    Patient Measurements: Height:  (175.3 cm) Weight: (!) 320 lb (145.2 kg) IBW/kg (Calculated) : 70.7 Heparin Dosing Weight:   Vital Signs: Temp: 98.2 F (36.8 C) (04/24 2314) Temp Source: Axillary (04/24 2314) BP: 111/61 (04/24 2145) Pulse Rate: 95 (04/24 2145)  Labs:  Recent Labs  03/01/17 1840 03/01/17 2005  HGB  --  12.2*  HCT  --  38.3*  PLT  --  132*  LABPROT  --  13.8  INR  --  1.06  CREATININE  --  1.46*  TROPONINI <0.03  --     Estimated Creatinine Clearance: 71.7 mL/min (A) (by C-G formula based on SCr of 1.46 mg/dL (H)).   Medical History: Past Medical History:  Diagnosis Date  . Asthma   . COPD (chronic obstructive pulmonary disease) (HCC)   . Heart attack (HCC)   . High blood pressure   . High cholesterol   . Scoliosis   . Seasonal allergies   . Sleep apnea     Medications:  Scheduled:  . [START ON 03/02/2017] aspirin EC  81 mg Oral Daily  . budesonide (PULMICORT) nebulizer solution  0.25 mg Nebulization BID  . [START ON 03/02/2017] clopidogrel  75 mg Oral Daily  . enoxaparin (LOVENOX) injection  30 mg Subcutaneous BID  . ipratropium-albuterol  3 mL Nebulization Q6H  . [START ON 03/02/2017] liraglutide  1.8 mg Subcutaneous Daily  . methylPREDNISolone (SOLU-MEDROL) injection  60 mg Intravenous Q6H  . [START ON 03/02/2017] pantoprazole  40 mg Oral Daily  . [START ON 03/02/2017] rosuvastatin  10 mg Oral Daily  . sodium chloride flush  3 mL Intravenous Q12H  . sodium chloride flush  3 mL Intravenous Q12H  . [START ON 03/02/2017] tamsulosin  0.4 mg Oral Daily    Assessment: Patient admitted for SOB and is on lovenox for VTE prophylaxis  Will dose adjust to lovenox 30 mg bid  since patient's TBW > 125% of his ideal.  Thomasene Ripple, PharmD, BCPS Clinical Pharmacist 03/01/2017

## 2017-03-01 NOTE — ED Notes (Signed)
Unable to obtain room air oxygen saturation.

## 2017-03-01 NOTE — ED Notes (Signed)
FIRST NURSE NOTE: pt arrives to stat desk diaphoretic and in respiratory distress, pt wearing home o2 3L on arrival.

## 2017-03-01 NOTE — Progress Notes (Signed)
Name: [name] MRN: [id number] DOB: 04-25-1951    ADMISSION DATE:  03/01/2017 CONSULTATION DATE:  03/01/17  REFERRING MD : Dr. Allena Katz  CHIEF COMPLAINT:  Shortness of breath  BRIEF PATIENT DESCRIPTION: 66 year old male with sleep apnea  And COPD now presenting with hypercarbic respiratory failure related to COPD exacerbation requiring BiPAP.  SIGNIFICANT EVENTS  4/24  Patient admitted to the ICU with Acute on Chronic Hypercarbic respiratory failure related to COPD exacerbation.  STUDIES:  04/19/16 ECHO>>Left ventricle: The cavity size was normal. Systolic function was  normal. The estimated ejection fraction was in the range of 60%  to 65%.    HISTORY OF PRESENT ILLNESS: [name] is a 66 year old male with known history of COPD- Uses 2L of O2 at baseline,OSA, Hypertension and Hyperlipidemia.  Patient presented to [facility] on 4/24 with 1 week of shortness of breath.  Patient was placed on BiPAP and was sent to the ICU for further monitoring.  He has a history of CAD,  OSA not on CPAp, type 2 DM. He has chronic LE edema.   PAST MEDICAL HISTORY :   has a past medical history of Asthma; COPD (chronic obstructive pulmonary disease) (HCC); Heart attack (HCC); High blood pressure; High cholesterol; Scoliosis; Seasonal allergies; and Sleep apnea.  has a past surgical history that includes back sx and Carpal tunnel release (Right). Prior to Admission medications   Medication Sig Start Date End Date Taking? Authorizing Provider  amoxicillin-clavulanate (AUGMENTIN) 875-125 MG tablet Take 1 tablet by mouth 2 (two) times daily. Patient not taking: Reported on 05/06/2016 04/20/16   Milagros Loll, MD  aspirin EC 81 MG tablet Take 81 mg by mouth daily.    Historical Provider, MD  celecoxib (CELEBREX) 200 MG capsule Take 200 mg by mouth daily.    Historical Provider, MD  clopidogrel (PLAVIX) 75 MG tablet Take 75 mg by mouth daily.    Historical Provider, MD  dexlansoprazole (DEXILANT) 60 MG  capsule Take 60 mg by mouth daily.    Historical Provider, MD  furosemide (LASIX) 40 MG tablet Take 40 mg by mouth 2 (two) times daily.     Historical Provider, MD  glipiZIDE (GLUCOTROL XL) 5 MG 24 hr tablet Take 5 mg by mouth 2 (two) times daily.    Historical Provider, MD  Liraglutide (VICTOZA) 18 MG/3ML SOPN Inject 1.8 mg into the skin daily.    Historical Provider, MD  nebivolol (BYSTOLIC) 10 MG tablet Take 10 mg by mouth every morning. *Note dose*    Historical Provider, MD  Nebivolol HCl (BYSTOLIC) 20 MG TABS Take 20 mg by mouth at bedtime. *note dose*    Historical Provider, MD  ondansetron (ZOFRAN ODT) 4 MG disintegrating tablet Take 1 tablet (4 mg total) by mouth every 8 (eight) hours as needed for nausea or vomiting. 12/21/15   Gayla Doss, MD  oxyCODONE (ROXICODONE) 5 MG immediate release tablet Take 1 tablet (5 mg total) by mouth every 6 (six) hours as needed for moderate pain. Do not drive while taking this medication. Patient not taking: Reported on 05/06/2016 04/20/16   Milagros Loll, MD  OXYGEN Inhale 2 L into the lungs continuous.    Historical Provider, MD  polyethylene glycol powder (GLYCOLAX/MIRALAX) powder Dissolve one heaping tablespoon in 4-8 ounces of juice or water and drink once daily. 12/21/15   Gayla Doss, MD  rosuvastatin (CRESTOR) 10 MG tablet Take 10 mg by mouth daily. Reported on 04/22/2016    Historical  Provider, MD  tamsulosin (FLOMAX) 0.4 MG CAPS capsule Take 0.4 mg by mouth daily.    Historical Provider, MD  telmisartan (MICARDIS) 80 MG tablet Take 80 mg by mouth daily.    Historical Provider, MD  traMADol (ULTRAM) 50 MG tablet Take 1 tablet (50 mg total) by mouth every 6 (six) hours as needed. 07/13/16 07/13/17  Joni Reining, PA-C  Vitamin D, Ergocalciferol, (DRISDOL) 50000 units CAPS capsule Take 50,000 Units by mouth every 7 (seven) days.    Historical Provider, MD   Allergies  Allergen Reactions  . Metformin And Related Other (See Comments)    "shakes" per pt   . Morphine And Related Other (See Comments)    Hallucinations, sweats    FAMILY HISTORY:  family history includes Arthritis in his mother; Asthma in his father; Cancer in his father; Heart disease in his father and mother; Macular degeneration in his brother. SOCIAL HISTORY:  reports that he has never smoked. He has never used smokeless tobacco. He reports that he does not drink alcohol or use drugs.  REVIEW OF SYSTEMS:   Constitutional: Negative for fever, chills, weight loss, malaise/fatigue and diaphoresis.  HENT: Negative for hearing loss, ear pain, nosebleeds, congestion, sore throat, neck pain, tinnitus and ear discharge.   Eyes: Negative for blurred vision, double vision, photophobia, pain, discharge and redness.  Respiratory: Negative for cough, hemoptysis, sputum production, shortness of breath, wheezing and stridor.   Cardiovascular: Negative for chest pain, palpitations, orthopnea, claudication, leg swelling and PND.  Gastrointestinal: Negative for heartburn, nausea, vomiting, abdominal pain, diarrhea, constipation, blood in stool and melena.  Genitourinary: Negative for dysuria, urgency, frequency, hematuria and flank pain.  Musculoskeletal: Negative for myalgias, back pain, joint pain and falls.  Skin: Negative for itching and rash.  Neurological: Negative for dizziness, tingling, tremors, sensory change, speech change, focal weakness, seizures, loss of consciousness, weakness and headaches.  Endo/Heme/Allergies: Negative for environmental allergies and polydipsia. Does not bruise/bleed easily.  SUBJECTIVE: Patient states" that his breathing has improved with BiPAP".  VITAL SIGNS: Temp:  [99.4 F (37.4 C)] 99.4 F (37.4 C) (04/24 1832) Pulse Rate:  [94-119] 95 (04/24 2145) Resp:  [13-23] 14 (04/24 2145) BP: (69-174)/(36-109) 111/61 (04/24 2145) SpO2:  [93 %-100 %] 96 % (04/24 2203) FiO2 (%):  [50 %] 50 % (04/24 2203) Weight:  [145.2 kg (320 lb)] 145.2 kg (320 lb)  (04/24 1829)  PHYSICAL EXAMINATION: General:  Morbidly obese AA male, on BiPAP Neuro:  Awake, Alert, oriented, no focal deficits HEENT:  AT,[place], No JVD Cardiovascular: s1s2,Regular, no m/r/g Lungs:  Diminished bibasilar, no wheezes,crackles, rhonchi noted Abdomen:  Obese, round, +bs, no guarding Musculoskeletal: +BLE edema, erythema Skin: warm,dry and intact   Recent Labs Lab 03/01/17 2005  NA 131*  K 3.6  CL 86*  CO2 39*  BUN 22*  CREATININE 1.46*  GLUCOSE 185*    Recent Labs Lab 03/01/17 2005  HGB 12.2*  HCT 38.3*  WBC 9.3  PLT 132*   Dg Chest Port 1 View  Result Date: 03/01/2017 CLINICAL DATA:  66 year old male with history of shortness of breath for 1 week. Diaphoretic. EXAM: PORTABLE CHEST 1 VIEW COMPARISON:  Chest x-ray 04/17/2016. FINDINGS: Lung volumes are low. Bibasilar opacities may reflect areas of atelectasis and/or airspace consolidation. Probable small bilateral pleural effusions. Crowding of the pulmonary vasculature, without frank pulmonary edema. Cardiac silhouette is largely obscured, but appears borderline enlarged. The patient is rotated to the left on today's exam, resulting in distortion of the mediastinal  contours and reduced diagnostic sensitivity and specificity for mediastinal pathology. Atherosclerosis in the thoracic aorta. IMPRESSION: 1. Low lung volumes with bibasilar areas of atelectasis and/or airspace consolidation. Probable small bilateral pleural effusions. 2. Aortic atherosclerosis. Electronically Signed   By: Trudie Reed M.D.   On: 03/01/2017 20:05    ASSESSMENT / PLAN:  Acute On Chronic Hypercarbic respiratory failure related to COPD Exacerbation Bibasilar Opacities concerning for PNA Lactic Acidosis Lower extremity edema/?Cellulitis   Plan Continue BiPAP, wean as tolerated Bronchodilators Agree with Levaquin Continue Methylprednisone, taper Follow procalcitonin/Lactic acid Will obtain Lower extremity dopplers  Lovenox for  DVT   Bincy Varughese,AG-ACNP Pulmonary and Critical Care Medicine [facility]   03/01/2017, 10:47 PM   Patient seen and examined with NP, above note as amended reflects my findings, assessment, plan. [name] is a 66 year old male with known history of COPD- Uses 2L of O2 at baseline,OSA, Hypertension and Hyperlipidemia.  Patient presented to [facility] on 4/24 with 1 week of shortness of breath. 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 Initial lactic acid was elevated at 5.4. The patient presented in some degree of respiratory distress, he was placed on BiPAP on 18/8 and 50% FiO2. Thus far. MRSA screen negative, blood and urine cultures negative, patient is afebrile, most recent echo, 1. Ago showed an EF of 60%. I reviewed personally on his imaging shows reduced lung volumes due to obesity, as well as pulmonary vascular congestion. Overall, this appears to be consistent with acute respiratory failure with acute COPD exacerbation, pulmonary edema, acute kidney injury with volume overload, obesity hypoventilation. We'll continue to wean down BiPAP as tolerated, recommended the patient stay on BiPAP daily at bedtime. Continue steroids, we'll de-escalate antibiotics as indicated.  Wells Guiles, M.D. 03/02/2017   Critical Care Attestation.  I have personally obtained a history, examined the patient, evaluated laboratory and imaging results, formulated the assessment and plan and placed orders. The Patient requires high complexity decision making for assessment and support, frequent evaluation and titration of therapies, application of advanced monitoring technologies and extensive interpretation of multiple databases. The patient has critical illness that could lead imminently to failure of 1 or more organ systems  and requires the highest level of physician preparedness to intervene.  Critical Care Time devoted to patient care services described in this note is 30 minutes and is exclusive of time spent in procedures.

## 2017-03-01 NOTE — ED Provider Notes (Addendum)
[facility] Emergency Department Provider Note  ____________________________________________   First MD Initiated Contact with Patient 03/01/17 1837     (approximate)  I have reviewed the triage vital signs and the nursing notes.   HISTORY  Chief Complaint Shortness of Breath   HPI [name] is a 66 y.o. male with a history of COPD on 2 L of home nasal cannula oxygen who is presenting to the emergency department with 1 week of worsening cough and shortness of breath. He was rushed back from triage because of diaphoresis and shortness of breath. An oxygen saturation was unable to be obtained in triage.   Past Medical History:  Diagnosis Date  . Asthma   . COPD (chronic obstructive pulmonary disease) (HCC)   . Heart attack (HCC)   . High blood pressure   . High cholesterol   . Scoliosis   . Seasonal allergies   . Sleep apnea     Patient Active Problem List   Diagnosis Date Noted  . Cellulitis of left leg 04/17/2016  . ARF (acute renal failure) (HCC) 04/17/2016  . Cellulitis of right leg 04/17/2016  . Chronic hypoxemic respiratory failure (HCC) 12/12/2013  . Shortness of breath 11/21/2013  . OSA on CPAP 11/21/2013  . CHF (congestive heart failure) (HCC) 11/21/2013  . CAD (coronary artery disease) of artery bypass graft 11/21/2013    Past Surgical History:  Procedure Laterality Date  . back sx     L4 L5   . CARPAL TUNNEL RELEASE Right     Prior to Admission medications   Medication Sig Start Date End Date Taking? Authorizing Provider  amoxicillin-clavulanate (AUGMENTIN) 875-125 MG tablet Take 1 tablet by mouth 2 (two) times daily. Patient not taking: Reported on 05/06/2016 04/20/16   Milagros Loll, MD  aspirin EC 81 MG tablet Take 81 mg by mouth daily.    Historical Provider, MD  celecoxib (CELEBREX) 200 MG capsule Take 200 mg by mouth daily.    Historical Provider, MD  clopidogrel (PLAVIX) 75 MG tablet Take 75 mg by mouth daily.     Historical Provider, MD  dexlansoprazole (DEXILANT) 60 MG capsule Take 60 mg by mouth daily.    Historical Provider, MD  furosemide (LASIX) 40 MG tablet Take 40 mg by mouth 2 (two) times daily.     Historical Provider, MD  glipiZIDE (GLUCOTROL XL) 5 MG 24 hr tablet Take 5 mg by mouth 2 (two) times daily.    Historical Provider, MD  Liraglutide (VICTOZA) 18 MG/3ML SOPN Inject 1.8 mg into the skin daily.    Historical Provider, MD  nebivolol (BYSTOLIC) 10 MG tablet Take 10 mg by mouth every morning. *Note dose*    Historical Provider, MD  Nebivolol HCl (BYSTOLIC) 20 MG TABS Take 20 mg by mouth at bedtime. *note dose*    Historical Provider, MD  ondansetron (ZOFRAN ODT) 4 MG disintegrating tablet Take 1 tablet (4 mg total) by mouth every 8 (eight) hours as needed for nausea or vomiting. 12/21/15   Gayla Doss, MD  oxyCODONE (ROXICODONE) 5 MG immediate release tablet Take 1 tablet (5 mg total) by mouth every 6 (six) hours as needed for moderate pain. Do not drive while taking this medication. Patient not taking: Reported on 05/06/2016 04/20/16   Milagros Loll, MD  OXYGEN Inhale 2 L into the lungs continuous.    Historical Provider, MD  polyethylene glycol powder (GLYCOLAX/MIRALAX) powder Dissolve one heaping tablespoon in 4-8 ounces of juice or water and  drink once daily. 12/21/15   Gayla Doss, MD  rosuvastatin (CRESTOR) 10 MG tablet Take 10 mg by mouth daily. Reported on 04/22/2016    Historical Provider, MD  tamsulosin (FLOMAX) 0.4 MG CAPS capsule Take 0.4 mg by mouth daily.    Historical Provider, MD  telmisartan (MICARDIS) 80 MG tablet Take 80 mg by mouth daily.    Historical Provider, MD  traMADol (ULTRAM) 50 MG tablet Take 1 tablet (50 mg total) by mouth every 6 (six) hours as needed. 07/13/16 07/13/17  Joni Reining, PA-C  Vitamin D, Ergocalciferol, (DRISDOL) 50000 units CAPS capsule Take 50,000 Units by mouth every 7 (seven) days.    Historical Provider, MD    Allergies Metformin and related and  Morphine and related  Family History  Problem Relation Age of Onset  . Heart disease Father   . Asthma Father   . Cancer Father   . Heart disease Mother   . Arthritis Mother   . Macular degeneration Brother     Social History Social History  Substance Use Topics  . Smoking status: Never Smoker  . Smokeless tobacco: Never Used  . Alcohol use No    Review of Systems  Constitutional: No fever/chills Eyes: No visual changes. ENT: No sore throat. Cardiovascular: Denies chest pain. Respiratory: as above Gastrointestinal: No abdominal pain.  No nausea, no vomiting.  No diarrhea.  No constipation. Genitourinary: Negative for dysuria. Musculoskeletal: Negative for back pain. Skin: Negative for rash. Neurological: Negative for headaches, focal weakness or numbness.   ____________________________________________   PHYSICAL EXAM:  VITAL SIGNS: ED Triage Vitals  Enc Vitals Group     BP 03/01/17 1832 (!) 174/109     Pulse Rate 03/01/17 1832 (!) 119     Resp 03/01/17 1832 (!) 21     Temp 03/01/17 1832 99.4 F (37.4 C)     Temp Source 03/01/17 1832 Axillary     SpO2 03/01/17 1832 100 %     Weight 03/01/17 1829 (!) 320 lb (145.2 kg)     Height --      Head Circumference --      Peak Flow --      Pain Score --      Pain Loc --      Pain Edu? --      Excl. in GC? --     Constitutional: Alert and oriented. Tachypneic with labored respirations and diaphoretic. Morbidly obese. Eyes: Conjunctivae are normal. PERRL. EOMI. Head: Atraumatic. Nose: No congestion/rhinnorhea. Mouth/Throat: Mucous membranes are moist.   Neck: No stridor.   Cardiovascular: tachycardic, regular rhythm. Grossly normal heart sounds.   Respiratory: Respirations. Speaks in 2-3 word sentences. Inspiratory rales to the bilateral bases with poor expiratory air movement with wheezing throughout. Gastrointestinal: Soft and nontender. No distention. Musculoskeletal: Bilateral lower extremity edema which the  patient says is chronic. Also with hyperpigmentation of the skin to the bilateral feet, ankles and lower calves that appears chronic from stasis changes. Neurologic:  Normal speech and language. No gross focal neurologic deficits are appreciated.  Skin:  Skin is warm, dry and intact. No rash noted. Psychiatric: Mood and affect are normal. Speech and behavior are normal.  ____________________________________________   LABS (all labs ordered are listed, but only abnormal results are displayed)  Labs Reviewed  LACTIC ACID, PLASMA - Abnormal; Notable for the following:       Result Value   Lactic Acid, Venous 5.4 (*)    All other components within normal limits  CBC WITH DIFFERENTIAL/PLATELET - Abnormal; Notable for the following:    RBC 4.29 (*)    Hemoglobin 12.2 (*)    HCT 38.3 (*)    RDW 15.1 (*)    Platelets 132 (*)    Neutro Abs 8.4 (*)    Lymphs Abs 0.5 (*)    All other components within normal limits  COMPREHENSIVE METABOLIC PANEL - Abnormal; Notable for the following:    Sodium 131 (*)    Chloride 86 (*)    CO2 39 (*)    Glucose, Bld 185 (*)    BUN 22 (*)    Creatinine, Ser 1.46 (*)    Calcium 8.6 (*)    Total Protein 8.3 (*)    Albumin 3.4 (*)    ALT 14 (*)    GFR calc non Af Amer 49 (*)    GFR calc Af Amer 56 (*)    All other components within normal limits  BLOOD GAS, ARTERIAL - Abnormal; Notable for the following:    pH, Arterial 7.25 (*)    pCO2 arterial 99 (*)    pO2, Arterial 57 (*)    Bicarbonate 43.4 (*)    Acid-Base Excess 12.1 (*)    All other components within normal limits  CULTURE, BLOOD (ROUTINE X 2)  CULTURE, BLOOD (ROUTINE X 2)  URINE CULTURE  MRSA PCR SCREENING  TROPONIN I  PROTIME-INR  CBC WITH DIFFERENTIAL/PLATELET  LACTIC ACID, PLASMA  URINALYSIS, COMPLETE (UACMP) WITH MICROSCOPIC   ____________________________________________  EKG  ED ECG REPORT I, Arelia Longest, the attending physician, personally viewed and interpreted  this ECG.   Date: 03/01/2017  EKG Time: 1831  Rate: 123  Rhythm: sinus tachycardia  Axis: Normal  Intervals:none  ST&T Change: No ST segment elevation or depression. No abnormal T-wave inversion.  ____________________________________________  RADIOLOGY  DG Chest Port 1 View (Final result)  Result time 03/01/17 20:05:30  Final result by Florencia Reasons, MD (03/01/17 20:05:30)           Narrative:   CLINICAL DATA: 66 year old male with history of shortness of breath for 1 week. Diaphoretic.  EXAM: PORTABLE CHEST 1 VIEW  COMPARISON: Chest x-ray 04/17/2016.  FINDINGS: Lung volumes are low. Bibasilar opacities may reflect areas of atelectasis and/or airspace consolidation. Probable small bilateral pleural effusions. Crowding of the pulmonary vasculature, without frank pulmonary edema. Cardiac silhouette is largely obscured, but appears borderline enlarged. The patient is rotated to the left on today's exam, resulting in distortion of the mediastinal contours and reduced diagnostic sensitivity and specificity for mediastinal pathology. Atherosclerosis in the thoracic aorta.  IMPRESSION: 1. Low lung volumes with bibasilar areas of atelectasis and/or airspace consolidation. Probable small bilateral pleural effusions. 2. Aortic atherosclerosis.   Electronically Signed By: Trudie Reed M.D. On: 03/01/2017 20:05          ____________________________________________   PROCEDURES  Procedure(s) performed:   Procedures  Critical Care performed:  CRITICAL CARE Performed by: Arelia Longest   Total critical care time: 35 minutes  Critical care time was exclusive of separately billable procedures and treating other patients.  Critical care was necessary to treat or prevent imminent or life-threatening deterioration.  Critical care was time spent personally by me on the following activities: development of treatment plan with patient and/or  surrogate as well as nursing, discussions with consultants, evaluation of patient's response to treatment, examination of patient, obtaining history from patient or surrogate, ordering and performing treatments and interventions, ordering and review of  laboratory studies, ordering and review of radiographic studies, pulse oximetry and re-evaluation of patient's condition.  ____________________________________________   INITIAL IMPRESSION / ASSESSMENT AND PLAN / ED COURSE  Pertinent labs & imaging results that were available during my care of the patient were reviewed by me and considered in my medical decision making (see chart for details).  Patient placed on BiPAP and is tolerating well. Sepsis alert called.    ----------------------------------------- 8:50 PM on 03/01/2017 -----------------------------------------  Patient is awake and alert. Tolerating the BiPAP well. PCO2 is elevated at 99 with a pH of 7.25 which represents respiratory acidosis. Likely secondary to COPD. Will be admitted to the hospital. Signed out to Dr. Allena Katz. Covered for pneumonia as well given borderline fever as well as tachycardia. Patient as well as family are understanding of the plan and willing to comply.  ____________________________________________   FINAL CLINICAL IMPRESSION(S) / ED DIAGNOSES  Sepsis. Pneumonia. COPD exacerbation.    NEW MEDICATIONS STARTED DURING THIS VISIT:  New Prescriptions   No medications on file     Note:  This document was prepared using Dragon voice recognition software and may include unintentional dictation errors.    Myrna Blazer, MD 03/01/17 2051  Due to severity of illness the patient was ordered the healthcare associated pneumonia cocktail.   Myrna Blazer, MD 03/01/17 [phone]

## 2017-03-01 NOTE — ED Triage Notes (Addendum)
Pt arrives to ed with c/o shortness of breath for about one week, recent cold per pt. Pt very diaphorectic visualized and to touch. Pt normally on home oxygen at 2 liters. Pt brought straight back to er room 4 with edp at bedside.  Pt placed on BIPAP at this time.

## 2017-03-02 ENCOUNTER — Inpatient Hospital Stay: Payer: Medicare Other

## 2017-03-02 LAB — CBC
HCT: 37.5 % — ABNORMAL LOW (ref 40.0–52.0)
HEMOGLOBIN: 11.9 g/dL — AB (ref 13.0–18.0)
MCH: 29.4 pg (ref 26.0–34.0)
MCHC: 31.8 g/dL — ABNORMAL LOW (ref 32.0–36.0)
MCV: 92.3 fL (ref 80.0–100.0)
PLATELETS: 119 10*3/uL — AB (ref 150–440)
RBC: 4.06 MIL/uL — AB (ref 4.40–5.90)
RDW: 14.9 % — ABNORMAL HIGH (ref 11.5–14.5)
WBC: 6.2 10*3/uL (ref 3.8–10.6)

## 2017-03-02 LAB — BASIC METABOLIC PANEL
Anion gap: 2 — ABNORMAL LOW (ref 5–15)
BUN: 23 mg/dL — AB (ref 6–20)
CHLORIDE: 96 mmol/L — AB (ref 101–111)
CO2: 37 mmol/L — ABNORMAL HIGH (ref 22–32)
Calcium: 7.9 mg/dL — ABNORMAL LOW (ref 8.9–10.3)
Creatinine, Ser: 1.25 mg/dL — ABNORMAL HIGH (ref 0.61–1.24)
GFR calc Af Amer: >60 mL/min (ref >60)
GFR calc non Af Amer: 59 mL/min — ABNORMAL LOW (ref >60)
Glucose, Bld: 143 mg/dL — ABNORMAL HIGH (ref 65–99)
POTASSIUM: 4.8 mmol/L (ref 3.5–5.1)
Sodium: 135 mmol/L (ref 135–145)

## 2017-03-02 LAB — GLUCOSE, CAPILLARY
GLUCOSE-CAPILLARY: 117 mg/dL — AB (ref 65–99)
GLUCOSE-CAPILLARY: 135 mg/dL — AB (ref 65–99)

## 2017-03-02 LAB — PROCALCITONIN
Procalcitonin: 3.74 ng/mL
Procalcitonin: 6.05 ng/mL

## 2017-03-02 LAB — VANCOMYCIN, RANDOM: Vancomycin Rm: 16

## 2017-03-02 LAB — MRSA PCR SCREENING: MRSA by PCR: NEGATIVE

## 2017-03-02 MED ORDER — INSULIN ASPART 100 UNIT/ML ~~LOC~~ SOLN: 0.0000 [IU] | Freq: Every day | SUBCUTANEOUS | Status: DC

## 2017-03-02 MED ORDER — INSULIN ASPART 100 UNIT/ML ~~LOC~~ SOLN
0.0000 [IU] | Freq: Three times a day (TID) | SUBCUTANEOUS | Status: DC
  Administered 2017-03-03: 2 [IU] via SUBCUTANEOUS
  Administered 2017-03-03: 3 [IU] via SUBCUTANEOUS
  Administered 2017-03-04: 5 [IU] via SUBCUTANEOUS
  Administered 2017-03-05: 2 [IU] via SUBCUTANEOUS
  Filled 2017-03-02: qty 5
  Filled 2017-03-02 (×2): qty 2
  Filled 2017-03-02: qty 3
  Filled 2017-03-02: qty 2

## 2017-03-02 MED ORDER — FUROSEMIDE 10 MG/ML IJ SOLN
20.0000 mg | Freq: Two times a day (BID) | INTRAMUSCULAR | Status: DC
  Administered 2017-03-02 – 2017-03-03 (×3): 20 mg via INTRAVENOUS
  Filled 2017-03-02 (×3): qty 2

## 2017-03-02 MED ORDER — ENOXAPARIN SODIUM 40 MG/0.4ML ~~LOC~~ SOLN
40.0000 mg | Freq: Two times a day (BID) | SUBCUTANEOUS | Status: DC
  Administered 2017-03-02 – 2017-03-05 (×6): 40 mg via SUBCUTANEOUS
  Filled 2017-03-02 (×7): qty 0.4

## 2017-03-02 MED ORDER — VANCOMYCIN HCL 10 G IV SOLR
1500.0000 mg | Freq: Two times a day (BID) | INTRAVENOUS | Status: DC
  Filled 2017-03-02 (×2): qty 1500

## 2017-03-02 MED ORDER — METHYLPREDNISOLONE SODIUM SUCC 40 MG IJ SOLR
40.0000 mg | Freq: Two times a day (BID) | INTRAMUSCULAR | Status: DC
  Administered 2017-03-02 – 2017-03-03 (×2): 40 mg via INTRAVENOUS
  Filled 2017-03-02 (×2): qty 1

## 2017-03-02 NOTE — Progress Notes (Signed)
CH responded to an OR for prayer. Pt was awake and difficult to understand as he was on a breathing machine. Pt indicated he was not feeling well and desired prayer for healing. CH provided prayer and stayed with Pt for a few minutes. CH is available for follow up as needed.     03/02/17 1500  Clinical Encounter Type  Visited With Patient  Visit Type Initial;Spiritual support  Referral From Nurse  Consult/Referral To Chaplain  Spiritual Encounters  Spiritual Needs Prayer

## 2017-03-02 NOTE — Care Management Note (Signed)
Case Management Note  Patient Details  Name: [name] MRN: [id number] Date of Birth: 12/16/1950  Subjective/Objective:                  Met with patient to discuss discharge planning. He is from home with his wife. He claims that he is homebound but indicated that he "can drive but hasn't".  He is on chronic O2 through [facility]. His PCP is Dr. Rosario Jacks and he feels that Dr. Rosario Jacks will agree with home health arrangements. He has used [facility] in the past and specifically requests Doren Custard PT with that agency. He requests a wheelchair ramp as he states he depends on a wheelchair to get around- he wants it for free which limits this resource. He has a walker and uses a CPAP at night. He denies problems getting to appointments and his medications are mailed through [employer]. Patient has a wooden ramp but indicated it required too much maintenance and "wants a metal ramp like (his) brothers".   Action/Plan: Referral to [facility] to see if they can assist this patient for home health. I checked with [facility] for ramp/donation which is unavailable. Patient is not able to pay for ramp per patient. I shared that sometimes the local church has volunteers to build ramps but he "wants a metal ramp". RNCM to continue to follow.    Expected Discharge Date:                  Expected Discharge Plan:     In-House Referral:     Discharge planning Services  CM Consult  Post Acute Care Choice:  Home Health, Durable Medical Equipment Choice offered to:  Patient  DME Arranged:    DME Agency:  [place]:  PT, RN [facility] Agency:  [facility] (now [facility])  Status of Service:  In process, will continue to follow  If discussed at Long Length of Stay Meetings, dates discussed:    Additional Comments:  Marshell Garfinkel, RN 03/02/2017, 11:54 AM

## 2017-03-02 NOTE — Progress Notes (Signed)
[employer] - [place] [facility]   PATIENT NAME: [name]    MR#:  [id number]  DATE OF BIRTH:  [birth year]  SUBJECTIVE:  CHIEF COMPLAINT:   Chief Complaint  Patient presents with  . Shortness of Breath  came with SOB, on bipap- feels little better- still on bipap.  REVIEW OF SYSTEMS:  CONSTITUTIONAL: No fever, fatigue or weakness.  EYES: No blurred or double vision.  EARS, NOSE, AND THROAT: No tinnitus or ear pain.  RESPIRATORY: No cough,positive for shortness of breath, wheezing ,no hemoptysis.  CARDIOVASCULAR: No chest pain, orthopnea, edema.  GASTROINTESTINAL: No nausea, vomiting, diarrhea or abdominal pain.  GENITOURINARY: No dysuria, hematuria.  ENDOCRINE: No polyuria, nocturia,  HEMATOLOGY: No anemia, easy bruising or bleeding SKIN: No rash or lesion. MUSCULOSKELETAL: No joint pain or arthritis.   NEUROLOGIC: No tingling, numbness, weakness.  PSYCHIATRY: No anxiety or depression.   ROS  DRUG ALLERGIES:   Allergies  Allergen Reactions  . Metformin And Related Other (See Comments)    "shakes" per pt  . Morphine And Related Other (See Comments)    Hallucinations, sweats    VITALS:  Blood pressure 125/75, pulse 78, temperature 98.1 F (36.7 C), temperature source Oral, resp. rate 12, height  (1.753 m), weight (!) 145.2 kg (320 lb), SpO2 99 %.  PHYSICAL EXAMINATION:  GENERAL:  66 y.o.-year-old patient lying in the bed with no acute distress.  EYES: Pupils equal, round, reactive to light and accommodation. No scleral icterus. Extraocular muscles intact.  HEENT: Head atraumatic, normocephalic. Oropharynx and nasopharynx clear.  NECK:  Supple, no jugular venous distention. No thyroid enlargement, no tenderness.  LUNGS: Normal breath sounds bilaterally, some wheezing, no crepitation. No use of accessory muscles of respiration. Bipap in use. CARDIOVASCULAR: S1, S2 normal. No murmurs, rubs, or gallops.  ABDOMEN: Soft, nontender, nondistended.  Bowel sounds present. No organomegaly or mass.  EXTREMITIES: No pedal edema, cyanosis, or clubbing.  NEUROLOGIC: Cranial nerves II through XII are intact. Muscle strength 5/5 in all extremities. Sensation intact. Gait not checked.  PSYCHIATRIC: The patient is alert and oriented x 3.  SKIN: No obvious rash, lesion, or ulcer.   Physical Exam LABORATORY PANEL:   CBC  Recent Labs Lab 03/02/17 0617  WBC 6.2  HGB 11.9*  HCT 37.5*  PLT 119*   ------------------------------------------------------------------------------------------------------------------  Chemistries   Recent Labs Lab 03/01/17 2005 03/02/17 0617  NA 131* 135  K 3.6 4.8  CL 86* 96*  CO2 39* 37*  GLUCOSE 185* 143*  BUN 22* 23*  CREATININE 1.46* 1.25*  CALCIUM 8.6* 7.9*  AST 28  --   ALT 14*  --   ALKPHOS 86  --   BILITOT 1.1  --    ------------------------------------------------------------------------------------------------------------------  Cardiac Enzymes  Recent Labs Lab 03/01/17 1840  TROPONINI <0.03   ------------------------------------------------------------------------------------------------------------------  RADIOLOGY:  US Venous Img Lower Bilateral  Result Date: 03/02/2017 CLINICAL DATA:  Lower extremity edema. EXAM: BILATERAL LOWER EXTREMITY VENOUS DOPPLER ULTRASOUND TECHNIQUE: Gray-scale sonography with graded compression, as well as color Doppler and duplex ultrasound were performed to evaluate the lower extremity deep venous systems from the level of the common femoral vein and including the common femoral, femoral, profunda femoral, popliteal and calf veins including the posterior tibial, peroneal and gastrocnemius veins when visible. The superficial great saphenous vein was also interrogated. Spectral Doppler was utilized to evaluate flow at rest and with distal augmentation maneuvers in the common femoral, femoral and popliteal veins. COMPARISON:  04/17/2016. FINDINGS: RIGHT LOWER  EXTREMITY Common Femoral Vein: No evidence of thrombus. Normal compressibility, respiratory phasicity and response to augmentation. Saphenofemoral Junction: No evidence of thrombus. Normal compressibility and flow on color Doppler imaging. Profunda Femoral Vein: No evidence of thrombus. Normal compressibility and flow on color Doppler imaging. Femoral Vein: No evidence of thrombus. Normal compressibility, respiratory phasicity and response to augmentation. Popliteal Vein: No evidence of thrombus. Normal compressibility, respiratory phasicity and response to augmentation. Calf Veins: Posterior tibial and peroneal veins not well visualized. Thrombus within the calf veins cannot be excluded. Superficial Great Saphenous Vein: No evidence of thrombus. Normal compressibility and flow on color Doppler imaging. Other Findings:  None. LEFT LOWER EXTREMITY Common Femoral Vein: No evidence of thrombus. Normal compressibility, respiratory phasicity and response to augmentation. Saphenofemoral Junction: No evidence of thrombus. Normal compressibility and flow on color Doppler imaging. Profunda Femoral Vein: No evidence of thrombus. Normal compressibility and flow on color Doppler imaging. Femoral Vein: No evidence of thrombus. Normal compressibility, respiratory phasicity and response to augmentation. Popliteal Vein: No evidence of thrombus. Normal compressibility, respiratory phasicity and response to augmentation. Calf Veins: Posterior tibial and peroneal veins not well visualized. Thrombus within the calf veins cannot be excluded. Superficial Great Saphenous Vein: No evidence of thrombus. Normal compressibility and flow on color Doppler imaging. Other Findings:  None. IMPRESSION: 1. Posterior tibial and peroneal veins not well visualized in the calf. Thrombus within the calf veins cannot be completely excluded. 2. No other evidence of DVT. Electronically Signed   By: Maisie Fus  Register   On: 03/02/2017 10:16   Dg Chest Port 1  View  Result Date: 03/01/2017 CLINICAL DATA:  66 year old male with history of shortness of breath for 1 week. Diaphoretic. EXAM: PORTABLE CHEST 1 VIEW COMPARISON:  Chest x-ray 04/17/2016. FINDINGS: Lung volumes are low. Bibasilar opacities may reflect areas of atelectasis and/or airspace consolidation. Probable small bilateral pleural effusions. Crowding of the pulmonary vasculature, without frank pulmonary edema. Cardiac silhouette is largely obscured, but appears borderline enlarged. The patient is rotated to the left on today's exam, resulting in distortion of the mediastinal contours and reduced diagnostic sensitivity and specificity for mediastinal pathology. Atherosclerosis in the thoracic aorta. IMPRESSION: 1. Low lung volumes with bibasilar areas of atelectasis and/or airspace consolidation. Probable small bilateral pleural effusions. 2. Aortic atherosclerosis. Electronically Signed   By: Trudie Reed M.D.   On: 03/01/2017 20:05    ASSESSMENT AND PLAN:   Active Problems:   Acute respiratory failure (HCC)   COPD exacerbation (HCC)   Healthcare-associated pneumonia   Lower extremity edema  Patient is a 66 year old presenting with acute respiratory failure  1. Acute hypercarbic respiratory failure This is due to acute on chronic COPD exasperation , pneumonia  nebulizers, Solu-Medrol  IV Levaquin  Cont Bipap.  2. Diabetes type 2  on sliding scale insulin hold oral regimen  3. Essential hypertension   hold blood pressure medications , blood pressure stable.  4. BPH will continue Flomax  5. Miscellaneous Lovenox for DVT prophylaxis   All the records are reviewed and case discussed with Care Management/Social Workerr. Management plans discussed with the patient, family and they are in agreement.  CODE STATUS: Full.  TOTAL TIME TAKING CARE OF THIS PATIENT: 35 minutes.     POSSIBLE D/C IN 1-2 DAYS, DEPENDING ON CLINICAL CONDITION.   Altamese Dilling M.D on  03/02/2017   Between 7am to 6pm - Pager - [phone]  After 6pm go to [email or web address] - password [email or web address]  [employer]  [phone]  CC: Primary care physician; Sherrie Mustache  Note: This dictation was prepared with Dragon dictation along with smaller phrase technology. Any transcriptional errors that result from this process are unintentional.

## 2017-03-03 DIAGNOSIS — R079 Chest pain, unspecified: Secondary | ICD-10-CM | POA: Diagnosis not present

## 2017-03-03 DIAGNOSIS — J810 Acute pulmonary edema: Secondary | ICD-10-CM

## 2017-03-03 LAB — BASIC METABOLIC PANEL
ANION GAP: 7 (ref 5–15)
BUN: 26 mg/dL — ABNORMAL HIGH (ref 6–20)
CALCIUM: 8.7 mg/dL — AB (ref 8.9–10.3)
CO2: 38 mmol/L — AB (ref 22–32)
CREATININE: 1.08 mg/dL (ref 0.61–1.24)
Chloride: 94 mmol/L — ABNORMAL LOW (ref 101–111)
Glucose, Bld: 124 mg/dL — ABNORMAL HIGH (ref 65–99)
Potassium: 4.8 mmol/L (ref 3.5–5.1)
SODIUM: 139 mmol/L (ref 135–145)

## 2017-03-03 LAB — POTASSIUM: Potassium: 4.6 mmol/L (ref 3.5–5.1)

## 2017-03-03 LAB — URINE CULTURE

## 2017-03-03 LAB — GLUCOSE, CAPILLARY
GLUCOSE-CAPILLARY: 152 mg/dL — AB (ref 65–99)
GLUCOSE-CAPILLARY: 115 mg/dL — AB (ref 65–99)
Glucose-Capillary: 144 mg/dL — ABNORMAL HIGH (ref 65–99)
Glucose-Capillary: 119 mg/dL — ABNORMAL HIGH (ref 65–99)

## 2017-03-03 LAB — PROCALCITONIN: PROCALCITONIN: 5.65 ng/mL

## 2017-03-03 MED ORDER — OXYCODONE HCL 5 MG PO TABS
5.0000 mg | ORAL_TABLET | Freq: Four times a day (QID) | ORAL | Status: DC | PRN
  Administered 2017-03-03 – 2017-03-04 (×4): 5 mg via ORAL
  Filled 2017-03-03 (×4): qty 1

## 2017-03-03 MED ORDER — PREDNISONE 20 MG PO TABS
40.0000 mg | ORAL_TABLET | Freq: Every day | ORAL | Status: DC
  Administered 2017-03-04 – 2017-03-05 (×2): 40 mg via ORAL
  Filled 2017-03-03 (×2): qty 2

## 2017-03-03 MED ORDER — FUROSEMIDE 10 MG/ML IJ SOLN
5.0000 mg/h | INTRAVENOUS | Status: DC
  Administered 2017-03-03: 5 mg/h via INTRAVENOUS
  Filled 2017-03-03: qty 25

## 2017-03-03 MED ORDER — POTASSIUM CHLORIDE CRYS ER 20 MEQ PO TBCR
20.0000 meq | EXTENDED_RELEASE_TABLET | Freq: Three times a day (TID) | ORAL | Status: DC
  Administered 2017-03-03 – 2017-03-05 (×6): 20 meq via ORAL
  Filled 2017-03-03 (×6): qty 1

## 2017-03-03 MED ORDER — AMIODARONE LOAD VIA INFUSION
150.0000 mg | Freq: Once | INTRAVENOUS | Status: AC
  Administered 2017-03-03: 150 mg via INTRAVENOUS
  Filled 2017-03-03 (×2): qty 83.34

## 2017-03-03 NOTE — Progress Notes (Signed)
Dr Rama aware that I am waiting for an IV insertion from IV team. Then I will start lasix drip.

## 2017-03-03 NOTE — Progress Notes (Signed)
Patient used call bell and upon arriving in room patient had complaint of feelings of decreased air flow on Bi-PAP mask. Upon assessment patient leak under 5, no tachypnea, sats 99%, no WOB noted. Patient denied feeling short of breath. Patient did have wheezing upon listening. Bi-PAP on 40%. Repositioned Bi-PAP mask and gave scheduled dose of steroid(see MAR). Also updated respiratory therapist on patient complaint. Will continue to monitor.   Patient verbalizing improrved air flow. Respiratory rate and saturations stable.

## 2017-03-03 NOTE — Progress Notes (Signed)
[employer] [facility] [place] at [facility]   PATIENT NAME: [name]    MR#:  [id number]  DATE OF BIRTH:  1951/04/24  SUBJECTIVE:  CHIEF COMPLAINT:   Chief Complaint  Patient presents with  . Shortness of Breath  came with SOB, on bipap- feels little better- still on bipap. Have significant edema.   Said - he feels better while sitting in a recliner.  REVIEW OF SYSTEMS:  CONSTITUTIONAL: No fever, fatigue or weakness.  EYES: No blurred or double vision.  EARS, NOSE, AND THROAT: No tinnitus or ear pain.  RESPIRATORY: No cough,positive for shortness of breath, wheezing ,no hemoptysis.  CARDIOVASCULAR: No chest pain, orthopnea, edema.  GASTROINTESTINAL: No nausea, vomiting, diarrhea or abdominal pain.  GENITOURINARY: No dysuria, hematuria.  ENDOCRINE: No polyuria, nocturia,  HEMATOLOGY: No anemia, easy bruising or bleeding SKIN: No rash or lesion. MUSCULOSKELETAL: No joint pain or arthritis.   NEUROLOGIC: No tingling, numbness, weakness.  PSYCHIATRY: No anxiety or depression.   ROS  DRUG ALLERGIES:   Allergies  Allergen Reactions  . Metformin And Related Other (See Comments)    "shakes" per pt  . Morphine And Related Other (See Comments)    Hallucinations, sweats    VITALS:  Blood pressure 117/65, pulse 88, temperature 98.7 F (37.1 C), temperature source Axillary, resp. rate 18, height  (1.753 m), weight (!) 145.2 kg (320 lb), SpO2 100 %.  PHYSICAL EXAMINATION:  GENERAL:  66 y.o.-year-old patient lying in the bed with no acute distress.  EYES: Pupils equal, round, reactive to light and accommodation. No scleral icterus. Extraocular muscles intact.  HEENT: Head atraumatic, normocephalic. Oropharynx and nasopharynx clear.  NECK:  Supple, no jugular venous distention. No thyroid enlargement, no tenderness.  LUNGS: Normal breath sounds bilaterally, some wheezing, some crepitation. No use of accessory muscles of respiration. Bipap in use. CARDIOVASCULAR:  S1, S2 normal. No murmurs, rubs, or gallops.  ABDOMEN: Soft, nontender, nondistended. Bowel sounds present. No organomegaly or mass.  EXTREMITIES: b/l pedal edema,no cyanosis, or clubbing.  NEUROLOGIC: Cranial nerves II through XII are intact. Muscle strength 4/5 in all extremities. Sensation intact. Gait not checked.  PSYCHIATRIC: The patient is alert and oriented x 3.  SKIN: No obvious rash, lesion, or ulcer.   Physical Exam LABORATORY PANEL:   CBC  Recent Labs Lab 03/02/17 0617  WBC 6.2  HGB 11.9*  HCT 37.5*  PLT 119*   ------------------------------------------------------------------------------------------------------------------  Chemistries   Recent Labs Lab 03/01/17 2005  03/03/17 0529 03/03/17 1650  NA 131*  < > 139  --   K 3.6  < > 4.8 4.6  CL 86*  < > 94*  --   CO2 39*  < > 38*  --   GLUCOSE 185*  < > 124*  --   BUN 22*  < > 26*  --   CREATININE 1.46*  < > 1.08  --   CALCIUM 8.6*  < > 8.7*  --   AST 28  --   --   --   ALT 14*  --   --   --   ALKPHOS 86  --   --   --   BILITOT 1.1  --   --   --   < > = values in this interval not displayed. ------------------------------------------------------------------------------------------------------------------  Cardiac Enzymes  Recent Labs Lab 03/01/17 1840  TROPONINI <0.03   ------------------------------------------------------------------------------------------------------------------  RADIOLOGY:  US Venous Img Lower Bilateral  Result Date: 03/02/2017 CLINICAL DATA:  Lower extremity edema. EXAM:  BILATERAL LOWER EXTREMITY VENOUS DOPPLER ULTRASOUND TECHNIQUE: Gray-scale sonography with graded compression, as well as color Doppler and duplex ultrasound were performed to evaluate the lower extremity deep venous systems from the level of the common femoral vein and including the common femoral, femoral, profunda femoral, popliteal and calf veins including the posterior tibial, peroneal and gastrocnemius  veins when visible. The superficial great saphenous vein was also interrogated. Spectral Doppler was utilized to evaluate flow at rest and with distal augmentation maneuvers in the common femoral, femoral and popliteal veins. COMPARISON:  04/17/2016. FINDINGS: RIGHT LOWER EXTREMITY Common Femoral Vein: No evidence of thrombus. Normal compressibility, respiratory phasicity and response to augmentation. Saphenofemoral Junction: No evidence of thrombus. Normal compressibility and flow on color Doppler imaging. Profunda Femoral Vein: No evidence of thrombus. Normal compressibility and flow on color Doppler imaging. Femoral Vein: No evidence of thrombus. Normal compressibility, respiratory phasicity and response to augmentation. Popliteal Vein: No evidence of thrombus. Normal compressibility, respiratory phasicity and response to augmentation. Calf Veins: Posterior tibial and peroneal veins not well visualized. Thrombus within the calf veins cannot be excluded. Superficial Great Saphenous Vein: No evidence of thrombus. Normal compressibility and flow on color Doppler imaging. Other Findings:  None. LEFT LOWER EXTREMITY Common Femoral Vein: No evidence of thrombus. Normal compressibility, respiratory phasicity and response to augmentation. Saphenofemoral Junction: No evidence of thrombus. Normal compressibility and flow on color Doppler imaging. Profunda Femoral Vein: No evidence of thrombus. Normal compressibility and flow on color Doppler imaging. Femoral Vein: No evidence of thrombus. Normal compressibility, respiratory phasicity and response to augmentation. Popliteal Vein: No evidence of thrombus. Normal compressibility, respiratory phasicity and response to augmentation. Calf Veins: Posterior tibial and peroneal veins not well visualized. Thrombus within the calf veins cannot be excluded. Superficial Great Saphenous Vein: No evidence of thrombus. Normal compressibility and flow on color Doppler imaging. Other  Findings:  None. IMPRESSION: 1. Posterior tibial and peroneal veins not well visualized in the calf. Thrombus within the calf veins cannot be completely excluded. 2. No other evidence of DVT. Electronically Signed   By: Maisie Fus  Register   On: 03/02/2017 10:16    ASSESSMENT AND PLAN:   Active Problems:   Acute respiratory failure (HCC)   COPD exacerbation (HCC)   Healthcare-associated pneumonia   Lower extremity edema  Patient is a 66 year old presenting with acute respiratory failure  1. Acute hypercarbic respiratory failure This is due to acute on chronic COPD exasperation , pneumonia  nebulizers, Solu-Medrol  IV Levaquin  Cont Bipap.  2. Diabetes type 2  on sliding scale insulin hold oral regimen  3. Essential hypertension   hold blood pressure medications , blood pressure stable.  4. BPH will continue Flomax  5. Miscellaneous Lovenox for DVT prophylaxis  6. Ac on ch diastolic CHF    IV lasix drip.  All the records are reviewed and case discussed with Care Management/Social Workerr. Management plans discussed with the patient, family and they are in agreement.  CODE STATUS: Full.  TOTAL TIME TAKING CARE OF THIS PATIENT: 35 minutes.     POSSIBLE D/C IN 1-2 DAYS, DEPENDING ON CLINICAL CONDITION.   Altamese Dilling M.D on 03/03/2017   Between 7am to 6pm - Pager - [phone]  After 6pm go to [email or web address] - password [email or web address] [facility]  [facility] [place] [facility]  Office  [phone]  CC: Primary care physician; Sherrie Mustache  Note: This dictation was prepared with Dragon dictation along with smaller phrase technology. Any transcriptional errors that result from this process are unintentional.

## 2017-03-03 NOTE — Progress Notes (Signed)
Name: [name] MRN: [id number] DOB: [birth year]    ADMISSION DATE:  03/01/2017 CONSULTATION DATE:  03/01/17  REFERRING MD : Dr. Allena Katz  CHIEF COMPLAINT:  Shortness of breath  BRIEF PATIENT DESCRIPTION: 66 year old male with sleep apnea  And COPD now presenting with hypercarbic respiratory failure related to COPD exacerbation requiring BiPAP.  SIGNIFICANT EVENTS  4/24  Patient admitted to the ICU with Acute on Chronic Hypercarbic respiratory failure related to COPD exacerbation.  STUDIES:  04/19/16 ECHO>>Left ventricle: The cavity size was normal. Systolic function was  normal. The estimated ejection fraction was in the range of 60%  to 65%.    Subjective  No acute events overnight, patient continues to complain of dyspnea, patient was briefly taken off BiPAP but had continued dyspnea and had to be placed back on this morning.  PAST MEDICAL HISTORY :   has a past medical history of Asthma; COPD (chronic obstructive pulmonary disease) (HCC); Heart attack (HCC); High blood pressure; High cholesterol; Scoliosis; Seasonal allergies; and Sleep apnea.  has a past surgical history that includes back sx and Carpal tunnel release (Right). Prior to Admission medications   Medication Sig Start Date End Date Taking? Authorizing Provider  amoxicillin-clavulanate (AUGMENTIN) 875-125 MG tablet Take 1 tablet by mouth 2 (two) times daily. Patient not taking: Reported on 05/06/2016 04/20/16   Milagros Loll, MD  aspirin EC 81 MG tablet Take 81 mg by mouth daily.    Historical Provider, MD  celecoxib (CELEBREX) 200 MG capsule Take 200 mg by mouth daily.    Historical Provider, MD  clopidogrel (PLAVIX) 75 MG tablet Take 75 mg by mouth daily.    Historical Provider, MD  dexlansoprazole (DEXILANT) 60 MG capsule Take 60 mg by mouth daily.    Historical Provider, MD  furosemide (LASIX) 40 MG tablet Take 40 mg by mouth 2 (two) times daily.     Historical Provider, MD  glipiZIDE (GLUCOTROL XL) 5 MG 24 hr  tablet Take 5 mg by mouth 2 (two) times daily.    Historical Provider, MD  Liraglutide (VICTOZA) 18 MG/3ML SOPN Inject 1.8 mg into the skin daily.    Historical Provider, MD  nebivolol (BYSTOLIC) 10 MG tablet Take 10 mg by mouth every morning. *Note dose*    Historical Provider, MD  Nebivolol HCl (BYSTOLIC) 20 MG TABS Take 20 mg by mouth at bedtime. *note dose*    Historical Provider, MD  ondansetron (ZOFRAN ODT) 4 MG disintegrating tablet Take 1 tablet (4 mg total) by mouth every 8 (eight) hours as needed for nausea or vomiting. 12/21/15   Gayla Doss, MD  oxyCODONE (ROXICODONE) 5 MG immediate release tablet Take 1 tablet (5 mg total) by mouth every 6 (six) hours as needed for moderate pain. Do not drive while taking this medication. Patient not taking: Reported on 05/06/2016 04/20/16   Milagros Loll, MD  OXYGEN Inhale 2 L into the lungs continuous.    Historical Provider, MD  polyethylene glycol powder (GLYCOLAX/MIRALAX) powder Dissolve one heaping tablespoon in 4-8 ounces of juice or water and drink once daily. 12/21/15   Gayla Doss, MD  rosuvastatin (CRESTOR) 10 MG tablet Take 10 mg by mouth daily. Reported on 04/22/2016    Historical Provider, MD  tamsulosin (FLOMAX) 0.4 MG CAPS capsule Take 0.4 mg by mouth daily.    Historical Provider, MD  telmisartan (MICARDIS) 80 MG tablet Take 80 mg by mouth daily.    Historical Provider, MD  traMADol (ULTRAM) 50 MG tablet  Take 1 tablet (50 mg total) by mouth every 6 (six) hours as needed. 07/13/16 07/13/17  Joni Reining, PA-C  Vitamin D, Ergocalciferol, (DRISDOL) 50000 units CAPS capsule Take 50,000 Units by mouth every 7 (seven) days.    Historical Provider, MD   Allergies  Allergen Reactions  . Metformin And Related Other (See Comments)    "shakes" per pt  . Morphine And Related Other (See Comments)    Hallucinations, sweats   REVIEW OF SYSTEMS:   Constitutional: Negative for fever, chills, weight loss, malaise/fatigue and diaphoresis.  HENT:  Negative for hearing loss, ear pain, nosebleeds, congestion, sore throat, neck pain, tinnitus and ear discharge.   Eyes: Negative for blurred vision, double vision, photophobia, pain, discharge and redness.  Respiratory: Negative for cough, hemoptysis, sputum production, shortness of breath, wheezing and stridor.   Cardiovascular: Negative for chest pain, palpitations, orthopnea, claudication, leg swelling and PND.  Gastrointestinal: Negative for heartburn, nausea, vomiting, abdominal pain, diarrhea, constipation, blood in stool and melena.  Genitourinary: Negative for dysuria, urgency, frequency, hematuria and flank pain.  Musculoskeletal: Negative for myalgias, back pain, joint pain and falls.  Skin: Negative for itching and rash.  Neurological: Negative for dizziness, tingling, tremors, sensory change, speech change, focal weakness, seizures, loss of consciousness, weakness and headaches.  Endo/Heme/Allergies: Negative for environmental allergies and polydipsia. Does not bruise/bleed easily.  SUBJECTIVE: Patient states" that his breathing has improved with BiPAP".  VITAL SIGNS: Temp:  [97.9 F (36.6 C)-98.7 F (37.1 C)] 98.4 F (36.9 C) (04/26 0300) Pulse Rate:  [74-110] 88 (04/26 0700) Resp:  [5-28] 25 (04/26 0700) BP: (107-149)/(56-91) 118/75 (04/26 0700) SpO2:  [90 %-100 %] 99 % (04/26 0740) FiO2 (%):  [40 %-50 %] 40 % (04/26 0740)  PHYSICAL EXAMINATION: General:  Morbidly obese AA male, on BiPAP Neuro:  Awake, Alert, oriented, no focal deficits HEENT:  AT,[place], No JVD Cardiovascular: s1s2,Regular, no m/r/g Lungs:  Diminished bibasilar, no wheezes,crackles, rhonchi noted Abdomen:  Obese, round, +bs, no guarding Musculoskeletal: +BLE edema, erythema Skin: warm,dry and intact   Recent Labs Lab 03/01/17 2005 03/02/17 0617 03/03/17 0529  NA 131* 135 139  K 3.6 4.8 4.8  CL 86* 96* 94*  CO2 39* 37* 38*  BUN 22* 23* 26*  CREATININE 1.46* 1.25* 1.08  GLUCOSE 185* 143* 124*      Recent Labs Lab 03/01/17 2005 03/02/17 0617  HGB 12.2* 11.9*  HCT 38.3* 37.5*  WBC 9.3 6.2  PLT 132* 119*   US Venous Img Lower Bilateral  Result Date: 03/02/2017 CLINICAL DATA:  Lower extremity edema. EXAM: BILATERAL LOWER EXTREMITY VENOUS DOPPLER ULTRASOUND TECHNIQUE: Gray-scale sonography with graded compression, as well as color Doppler and duplex ultrasound were performed to evaluate the lower extremity deep venous systems from the level of the common femoral vein and including the common femoral, femoral, profunda femoral, popliteal and calf veins including the posterior tibial, peroneal and gastrocnemius veins when visible. The superficial great saphenous vein was also interrogated. Spectral Doppler was utilized to evaluate flow at rest and with distal augmentation maneuvers in the common femoral, femoral and popliteal veins. COMPARISON:  04/17/2016. FINDINGS: RIGHT LOWER EXTREMITY Common Femoral Vein: No evidence of thrombus. Normal compressibility, respiratory phasicity and response to augmentation. Saphenofemoral Junction: No evidence of thrombus. Normal compressibility and flow on color Doppler imaging. Profunda Femoral Vein: No evidence of thrombus. Normal compressibility and flow on color Doppler imaging. Femoral Vein: No evidence of thrombus. Normal compressibility, respiratory phasicity and response to augmentation. Popliteal Vein: No  evidence of thrombus. Normal compressibility, respiratory phasicity and response to augmentation. Calf Veins: Posterior tibial and peroneal veins not well visualized. Thrombus within the calf veins cannot be excluded. Superficial Great Saphenous Vein: No evidence of thrombus. Normal compressibility and flow on color Doppler imaging. Other Findings:  None. LEFT LOWER EXTREMITY Common Femoral Vein: No evidence of thrombus. Normal compressibility, respiratory phasicity and response to augmentation. Saphenofemoral Junction: No evidence of thrombus. Normal  compressibility and flow on color Doppler imaging. Profunda Femoral Vein: No evidence of thrombus. Normal compressibility and flow on color Doppler imaging. Femoral Vein: No evidence of thrombus. Normal compressibility, respiratory phasicity and response to augmentation. Popliteal Vein: No evidence of thrombus. Normal compressibility, respiratory phasicity and response to augmentation. Calf Veins: Posterior tibial and peroneal veins not well visualized. Thrombus within the calf veins cannot be excluded. Superficial Great Saphenous Vein: No evidence of thrombus. Normal compressibility and flow on color Doppler imaging. Other Findings:  None. IMPRESSION: 1. Posterior tibial and peroneal veins not well visualized in the calf. Thrombus within the calf veins cannot be completely excluded. 2. No other evidence of DVT. Electronically Signed   By: Maisie Fus  Register   On: 03/02/2017 10:16   Dg Chest Port 1 View  Result Date: 03/01/2017 CLINICAL DATA:  66 year old male with history of shortness of breath for 1 week. Diaphoretic. EXAM: PORTABLE CHEST 1 VIEW COMPARISON:  Chest x-ray 04/17/2016. FINDINGS: Lung volumes are low. Bibasilar opacities may reflect areas of atelectasis and/or airspace consolidation. Probable small bilateral pleural effusions. Crowding of the pulmonary vasculature, without frank pulmonary edema. Cardiac silhouette is largely obscured, but appears borderline enlarged. The patient is rotated to the left on today's exam, resulting in distortion of the mediastinal contours and reduced diagnostic sensitivity and specificity for mediastinal pathology. Atherosclerosis in the thoracic aorta. IMPRESSION: 1. Low lung volumes with bibasilar areas of atelectasis and/or airspace consolidation. Probable small bilateral pleural effusions. 2. Aortic atherosclerosis. Electronically Signed   By: Trudie Reed M.D.   On: 03/01/2017 20:05    ASSESSMENT / PLAN:  Acute On Chronic Hypercarbic respiratory failure  related to COPD Exacerbation versus possible pneumonia. Bibasilar Opacities concerning for PNA Lower extremity edema, chronic venous stasis, and volume overload. Acute kidney injury, improved today. LE dopplers; negative for DVT.  Afib, improved on amiodarone infusion, but still tachycardia.  OHS  Plan Continue BiPAP, wean as tolerated, continue at night.  Will start on Lasix infusion at 5 mg per hour 24 hours, start potassium replacement 3 times daily, will monitor potassium levels. Agree with Levaquin empirically, for pneumonia. Patient's pro calcitonin levels are elevated. Taper Methylprednisone Follow procalcitonin/Lactic acid Lovenox for DVT Change amio infusion to PO tomorrow if rate controlled.     Wells Guiles, M.D. 03/03/2017

## 2017-03-04 DIAGNOSIS — J189 Pneumonia, unspecified organism: Principal | ICD-10-CM

## 2017-03-04 DIAGNOSIS — J9601 Acute respiratory failure with hypoxia: Secondary | ICD-10-CM

## 2017-03-04 LAB — CBC
HEMATOCRIT: 38.6 % — AB (ref 40.0–52.0)
Hemoglobin: 12.4 g/dL — ABNORMAL LOW (ref 13.0–18.0)
MCH: 28.7 pg (ref 26.0–34.0)
MCHC: 32 g/dL (ref 32.0–36.0)
MCV: 89.5 fL (ref 80.0–100.0)
PLATELETS: 132 10*3/uL — AB (ref 150–440)
RBC: 4.31 MIL/uL — ABNORMAL LOW (ref 4.40–5.90)
RDW: 14.7 % — AB (ref 11.5–14.5)
WBC: 6.4 10*3/uL (ref 3.8–10.6)

## 2017-03-04 LAB — BASIC METABOLIC PANEL
ANION GAP: 7 (ref 5–15)
BUN: 24 mg/dL — ABNORMAL HIGH (ref 6–20)
CO2: 41 mmol/L — ABNORMAL HIGH (ref 22–32)
Calcium: 8.5 mg/dL — ABNORMAL LOW (ref 8.9–10.3)
Chloride: 92 mmol/L — ABNORMAL LOW (ref 101–111)
Creatinine, Ser: 1.02 mg/dL (ref 0.61–1.24)
Glucose, Bld: 118 mg/dL — ABNORMAL HIGH (ref 65–99)
Potassium: 4.1 mmol/L (ref 3.5–5.1)
SODIUM: 140 mmol/L (ref 135–145)

## 2017-03-04 LAB — GLUCOSE, CAPILLARY
Glucose-Capillary: 108 mg/dL — ABNORMAL HIGH (ref 65–99)
Glucose-Capillary: 117 mg/dL — ABNORMAL HIGH (ref 65–99)
Glucose-Capillary: 212 mg/dL — ABNORMAL HIGH (ref 65–99)
Glucose-Capillary: 121 mg/dL — ABNORMAL HIGH (ref 65–99)

## 2017-03-04 LAB — MAGNESIUM: MAGNESIUM: 1.6 mg/dL — AB (ref 1.7–2.4)

## 2017-03-04 LAB — POTASSIUM: POTASSIUM: 4.2 mmol/L (ref 3.5–5.1)

## 2017-03-04 MED ORDER — FUROSEMIDE 10 MG/ML IJ SOLN
40.0000 mg | Freq: Two times a day (BID) | INTRAMUSCULAR | Status: DC
  Administered 2017-03-04 – 2017-03-05 (×2): 40 mg via INTRAVENOUS
  Filled 2017-03-04 (×2): qty 4

## 2017-03-04 MED ORDER — LEVOFLOXACIN 500 MG PO TABS
500.0000 mg | ORAL_TABLET | ORAL | Status: DC
  Administered 2017-03-04: 500 mg via ORAL
  Filled 2017-03-04: qty 1

## 2017-03-04 MED ORDER — MAGNESIUM SULFATE 4 GM/100ML IV SOLN
4.0000 g | Freq: Once | INTRAVENOUS | Status: AC
  Administered 2017-03-04: 4 g via INTRAVENOUS
  Filled 2017-03-04: qty 100

## 2017-03-04 MED ORDER — FLUTICASONE PROPIONATE 50 MCG/ACT NA SUSP
1.0000 | Freq: Every day | NASAL | Status: DC
  Administered 2017-03-04 – 2017-03-05 (×2): 1 via NASAL
  Filled 2017-03-04: qty 16

## 2017-03-04 NOTE — Progress Notes (Signed)
MEDICATION RELATED CONSULT NOTE   Pharmacy Consult for electrolyte management   Pharmacy consulted for electrolyte management for 66 yo male admitted with acute respiratory failure. Patient is currently receiving furosemide  IV BID and potassium PO TID.   Plan:  Will replace magnesium 4g IV x 1 and will recheck all electrolytes with am labs.    Allergies  Allergen Reactions  . Metformin And Related Other (See Comments)    "shakes" per pt  . Morphine And Related Other (See Comments)    Hallucinations, sweats    Patient Measurements: Height:  (175.3 cm) Weight: (!) 320 lb (145.2 kg) IBW/kg (Calculated) : 70.7   Vital Signs: Temp: 98 F (36.7 C) (04/27 1200) Temp Source: Axillary (04/27 1200) BP: 144/86 (04/27 1500) Pulse Rate: 75 (04/27 1300) Intake/Output from previous day: 04/26 0701 - 04/27 0700 In: 188.2 [I.V.:88.2; IV Piggyback:100] Out: 4100 [Urine:4100] Intake/Output from this shift: Total I/O In: 364.2 [P.O.:340; I.V.:24.2] Out: 1450 [Urine:1450]  Labs:  Recent Labs  03/01/17 2005 03/02/17 0617 03/03/17 0529 03/04/17 0050  WBC 9.3 6.2  --  6.4  HGB 12.2* 11.9*  --  12.4*  HCT 38.3* 37.5*  --  38.6*  PLT 132* 119*  --  132*  CREATININE 1.46* 1.25* 1.08 1.02  MG  --   --   --  1.6*  ALBUMIN 3.4*  --   --   --   PROT 8.3*  --   --   --   AST 28  --   --   --   ALT 14*  --   --   --   ALKPHOS 86  --   --   --   BILITOT 1.1  --   --   --    Estimated Creatinine Clearance: 102.6 mL/min (by C-G formula based on SCr of 1.02 mg/dL).   Medical History: Past Medical History:  Diagnosis Date  . Asthma   . COPD (chronic obstructive pulmonary disease) (HCC)   . Heart attack (HCC)   . High blood pressure   . High cholesterol   . Scoliosis   . Seasonal allergies   . Sleep apnea     Pharmacy will continue to monitor and adjust per consult.   Zain Lankford L 03/04/2017,4:53 PM

## 2017-03-04 NOTE — Progress Notes (Signed)
CONCERNING: Antibiotic IV to Oral Route Change Policy  RECOMMENDATION: This patient is receiving levofloxacin by the intravenous route.  Based on criteria approved by the Pharmacy and Therapeutics Committee, the antibiotic(s) is/are being converted to the equivalent oral dose form(s).   DESCRIPTION: These criteria include:  Patient being treated for a respiratory tract infection, urinary tract infection, cellulitis or clostridium difficile associated diarrhea if on metronidazole  The patient is not neutropenic and does not exhibit a GI malabsorption state  The patient is eating (either orally or via tube) and/or has been taking other orally administered medications for a least 24 hours  The patient is improving clinically and has a Tmax < 100.5  If you have questions about this conversion, please contact the Pharmacy Department    [phone] )  [facility]   [phone] )  [facility]   [phone] )  [facility]   [phone] )  [facility]   [phone] )  [facility]    MLS 03/04/17

## 2017-03-04 NOTE — Progress Notes (Signed)
Patient resting quietly in bed for most of shift. Prefers pillow behind back for comfort of breathing. Urine output adequate this shift. Continues on Nasal Cannula at 5 LPM. Patient refused dinner this evening. Plan of care discussed with patient for potential transfer to floor tomorrow.

## 2017-03-04 NOTE — Progress Notes (Signed)
[facility] - [place] at [facility]   PATIENT NAME: [name]    MR#:  [id number]  DATE OF BIRTH:  June 29, 1951  SUBJECTIVE:  CHIEF COMPLAINT:   Chief Complaint  Patient presents with  . Shortness of Breath  came with SOB, on bipap- feels little better- still on bipap. Have significant edema.   Said - he feels better while sitting in a recliner.    Had good diuresis with IV lasix. On HFNC now.  REVIEW OF SYSTEMS:   CONSTITUTIONAL: No fever, fatigue or weakness.  EYES: No blurred or double vision.  EARS, NOSE, AND THROAT: No tinnitus or ear pain.  RESPIRATORY: No cough,positive for shortness of breath, wheezing ,no hemoptysis.  CARDIOVASCULAR: No chest pain, orthopnea, edema.  GASTROINTESTINAL: No nausea, vomiting, diarrhea or abdominal pain.  GENITOURINARY: No dysuria, hematuria.  ENDOCRINE: No polyuria, nocturia,  HEMATOLOGY: No anemia, easy bruising or bleeding SKIN: No rash or lesion. MUSCULOSKELETAL: No joint pain or arthritis.   NEUROLOGIC: No tingling, numbness, weakness.  PSYCHIATRY: No anxiety or depression.   ROS  DRUG ALLERGIES:   Allergies  Allergen Reactions  . Metformin And Related Other (See Comments)    "shakes" per pt  . Morphine And Related Other (See Comments)    Hallucinations, sweats    VITALS:  Blood pressure (!) 167/88, pulse 78, temperature 98.2 F (36.8 C), temperature source Oral, resp. rate 20, height  (1.753 m), weight (!) 145.2 kg (320 lb), SpO2 100 %.  PHYSICAL EXAMINATION:  GENERAL:  66 y.o.-year-old patient lying in the bed with no acute distress.  EYES: Pupils equal, round, reactive to light and accommodation. No scleral icterus. Extraocular muscles intact.  HEENT: Head atraumatic, normocephalic. Oropharynx and nasopharynx clear.  NECK:  Supple, no jugular venous distention. No thyroid enlargement, no tenderness.  LUNGS: Normal breath sounds bilaterally, some wheezing, some crepitation. No use of accessory  muscles of respiration. HFNC in use. CARDIOVASCULAR: S1, S2 normal. No murmurs, rubs, or gallops.  ABDOMEN: Soft, nontender, nondistended. Bowel sounds present. No organomegaly or mass.  EXTREMITIES: b/l pedal edema,no cyanosis, or clubbing.  NEUROLOGIC: Cranial nerves II through XII are intact. Muscle strength 4/5 in all extremities. Sensation intact. Gait not checked.  PSYCHIATRIC: The patient is alert and oriented x 3.  SKIN: No obvious rash, lesion, or ulcer.   Physical Exam LABORATORY PANEL:   CBC  Recent Labs Lab 03/04/17 0050  WBC 6.4  HGB 12.4*  HCT 38.6*  PLT 132*   ------------------------------------------------------------------------------------------------------------------  Chemistries   Recent Labs Lab 03/01/17 2005  03/04/17 0050  NA 131*  < > 140  K 3.6  < > 4.1  4.2  CL 86*  < > 92*  CO2 39*  < > 41*  GLUCOSE 185*  < > 118*  BUN 22*  < > 24*  CREATININE 1.46*  < > 1.02  CALCIUM 8.6*  < > 8.5*  MG  --   --  1.6*  AST 28  --   --   ALT 14*  --   --   ALKPHOS 86  --   --   BILITOT 1.1  --   --   < > = values in this interval not displayed. ------------------------------------------------------------------------------------------------------------------  Cardiac Enzymes  Recent Labs Lab 03/01/17 1840  TROPONINI <0.03   ------------------------------------------------------------------------------------------------------------------  RADIOLOGY:  No results found.  ASSESSMENT AND PLAN:   Active Problems:   Acute respiratory failure (HCC)   COPD exacerbation (HCC)   Healthcare-associated pneumonia  Lower extremity edema  Patient is 66 year old presenting with acute respiratory failure  1. Acute hypercarbic respiratory failure This is due to acute on chronic COPD exasperation , pneumonia  nebulizers, Solu-Medrol  IV Levaquin  Cont Bipap.  2. Diabetes type 2  on sliding scale insulin hold oral regimen  3. Essential  hypertension   hold blood pressure medications , blood pressure stable.  4. BPH will continue Flomax  5. Miscellaneous Lovenox for DVT prophylaxis  6. Ac on ch diastolic CHF    IV lasix drip. Had good diuresis, now switch IV BID.  All the records are reviewed and case discussed with Care Management/Social Workerr. Management plans discussed with the patient, family and they are in agreement.  CODE STATUS: Full.  TOTAL TIME TAKING CARE OF THIS PATIENT: 35 minutes.   POSSIBLE D/C IN 1-2 DAYS, DEPENDING ON CLINICAL CONDITION.   Altamese Dilling M.D on 03/04/2017   Between 7am to 6pm - Pager - [phone]  After 6pm go to [email or web address] - password [email or web address]  [facility]  Office  [phone]  CC: Primary care physician; Sherrie Mustache  Note: This dictation was prepared with Dragon dictation along with smaller phrase technology. Any transcriptional errors that result from this process are unintentional.

## 2017-03-04 NOTE — Progress Notes (Signed)
Name: [name] MRN: [id number] DOB: 05/06/1951    ADMISSION DATE:  03/01/2017 CONSULTATION DATE:  03/01/17  REFERRING MD : Dr. Allena Katz   BRIEF PATIENT DESCRIPTION: 66 year old male with sleep apnea  And COPD now presenting with hypercarbic respiratory failure related to COPD exacerbation requiring BiPAP.  STUDIES:  04/19/16 ECHO>>Left ventricle: The cavity size was normal. Systolic function was  normal. The estimated ejection fraction was in the range of 60%  to 65%.    Subjective  No acute events overnight. Patient on HFNC 40% / 40L. Denies any chest pain or worsening cough/shortness of breath. Wants to know is he can move out of the ICU.  PAST MEDICAL HISTORY :   has a past medical history of Asthma; COPD (chronic obstructive pulmonary disease) (HCC); Heart attack (HCC); High blood pressure; High cholesterol; Scoliosis; Seasonal allergies; and Sleep apnea.  has a past surgical history that includes back sx and Carpal tunnel release (Right).    Allergies  Allergen Reactions  . Metformin And Related Other (See Comments)    "shakes" per pt  . Morphine And Related Other (See Comments)    Hallucinations, sweats    VITAL SIGNS: Temp:  [98.2 F (36.8 C)-99 F (37.2 C)] 98.7 F (37.1 C) (04/27 0800) Pulse Rate:  [76-104] 76 (04/27 1100) Resp:  [13-25] 17 (04/27 1100) BP: (95-151)/(50-86) 151/81 (04/27 1100) SpO2:  [94 %-100 %] 96 % (04/27 1100) FiO2 (%):  [40 %-49.8 %] 40 % (04/27 1032)  PHYSICAL EXAMINATION: General:  Morbidly obese AA male, on BiPAP Neuro:  Awake, Alert, oriented, no focal deficits HEENT:  AT,[place], No JVD Cardiovascular: s1s2,Regular, no m/r/g Lungs:  Diminished bibasilar, no wheezes,crackles, rhonchi noted Abdomen:  Obese, round, +bs, no guarding Musculoskeletal: +LE edema, erythema Skin: warm,dry and intact   Recent Labs Lab 03/02/17 0617 03/03/17 0529 03/03/17 1650 03/04/17 0050  NA 135 139  --  140  K 4.8 4.8 4.6 4.1  4.2  CL 96* 94*   --  92*  CO2 37* 38*  --  41*  BUN 23* 26*  --  24*  CREATININE 1.25* 1.08  --  1.02  GLUCOSE 143* 124*  --  118*    Recent Labs Lab 03/01/17 2005 03/02/17 0617 03/04/17 0050  HGB 12.2* 11.9* 12.4*  HCT 38.3* 37.5* 38.6*  WBC 9.3 6.2 6.4  PLT 132* 119* 132*    Current Facility-Administered Medications:  .  0.9 %  sodium chloride infusion, 250 mL, Intravenous, PRN, Auburn Bilberry, MD, Last Rate: 10 mL/hr at 03/02/17 0326, 250 mL at 03/02/17 0326 .  acetaminophen (TYLENOL) tablet 650 mg, 650 mg, Oral, Q6H PRN, 650 mg at 03/02/17 2206 **OR** acetaminophen (TYLENOL) suppository 650 mg, 650 mg, Rectal, Q6H PRN, Auburn Bilberry, MD .  aspirin EC tablet 81 mg, 81 mg, Oral, Daily, Auburn Bilberry, MD, 81 mg at 03/04/17 1024 .  budesonide (PULMICORT) nebulizer solution 0.25 mg, 0.25 mg, Nebulization, BID, Auburn Bilberry, MD, 0.25 mg at 03/04/17 0730 .  clopidogrel (PLAVIX) tablet 75 mg, 75 mg, Oral, Daily, Auburn Bilberry, MD, 75 mg at 03/04/17 1023 .  enoxaparin (LOVENOX) injection 40 mg, 40 mg, Subcutaneous, BID, Valentina Gu, RPH, 40 mg at 03/04/17 1023 .  furosemide (LASIX) injection 40 mg, 40 mg, Intravenous, BID, Dorothyann Gibbs, MD .  insulin aspart (novoLOG) injection 0-15 Units, 0-15 Units, Subcutaneous, TID WC, Eugenie Norrie, NP, 2 Units at 03/03/17 1219 .  insulin aspart (novoLOG) injection 0-5 Units, 0-5 Units, Subcutaneous,  QHS, Eugenie Norrie, NP .  ipratropium-albuterol (DUONEB) 0.5-2.5 (3) MG/3ML nebulizer solution 3 mL, 3 mL, Nebulization, Q6H, Auburn Bilberry, MD, 3 mL at 03/04/17 1304 .  levofloxacin (LEVAQUIN) IVPB 500 mg, 500 mg, Intravenous, Q24H, Auburn Bilberry, MD, Stopped at 03/03/17 2315 .  oxyCODONE (Oxy IR/ROXICODONE) immediate release tablet 5 mg, 5 mg, Oral, Q6H PRN, Bincy S Varughese, NP, 5 mg at 03/04/17 1047 .  pantoprazole (PROTONIX) EC tablet 40 mg, 40 mg, Oral, Daily, Auburn Bilberry, MD, 40 mg at 03/04/17 1023 .  potassium chloride SA (K-DUR,KLOR-CON) CR tablet  20 mEq, 20 mEq, Oral, TID, Shane Crutch, MD, 20 mEq at 03/04/17 1024 .  predniSONE (DELTASONE) tablet 40 mg, 40 mg, Oral, Q breakfast, Shane Crutch, MD, 40 mg at 03/04/17 1024 .  rosuvastatin (CRESTOR) tablet 10 mg, 10 mg, Oral, Daily, Auburn Bilberry, MD, 10 mg at 03/03/17 1648 .  sodium chloride flush (NS) 0.9 % injection 3 mL, 3 mL, Intravenous, Q12H, Auburn Bilberry, MD, 3 mL at 03/04/17 0900 .  sodium chloride flush (NS) 0.9 % injection 3 mL, 3 mL, Intravenous, Q12H, Auburn Bilberry, MD, 3 mL at 03/02/17 2158 .  sodium chloride flush (NS) 0.9 % injection 3 mL, 3 mL, Intravenous, PRN, Auburn Bilberry, MD .  tamsulosin (FLOMAX) capsule 0.4 mg, 0.4 mg, Oral, Daily, Auburn Bilberry, MD, 0.4 mg at 03/04/17 1023  ASSESSMENT / PLAN: Acute On Chronic Hypercarbic hypoxic respiratory failure 2 to COPD Exacerbation / Suspected pneumonia. Fluid overload Acute kidney injury, improved  Afib -controlled. OHS Hypomagnesemia.  Plan On HFNC. Wean as tolerated to maintain sats >89%. On levaquin. BD. Oral steroids. BIPAP prn/qHS. BID lasix. Will monitor UOP and creatinine closely. On lovenox. Rate controlled. Will check BNP. Off amiodarone. Will monitor. Rate controlled. Replaced magnesium Will get swallowing evaluation.    Dorothyann Gibbs, M.D. 03/04/2017

## 2017-03-05 ENCOUNTER — Inpatient Hospital Stay: Payer: Medicare Other

## 2017-03-05 DIAGNOSIS — A419 Sepsis, unspecified organism: Secondary | ICD-10-CM

## 2017-03-05 LAB — GLUCOSE, CAPILLARY
GLUCOSE-CAPILLARY: 118 mg/dL — AB (ref 65–99)
Glucose-Capillary: 193 mg/dL — ABNORMAL HIGH (ref 65–99)

## 2017-03-05 LAB — BRAIN NATRIURETIC PEPTIDE: B Natriuretic Peptide: 34 pg/mL (ref 0.0–100.0)

## 2017-03-05 LAB — BASIC METABOLIC PANEL
ANION GAP: 6 (ref 5–15)
BUN: 24 mg/dL — AB (ref 6–20)
CO2: 43 mmol/L — AB (ref 22–32)
Calcium: 9 mg/dL (ref 8.9–10.3)
Chloride: 89 mmol/L — ABNORMAL LOW (ref 101–111)
Creatinine, Ser: 1.25 mg/dL — ABNORMAL HIGH (ref 0.61–1.24)
GFR calc Af Amer: >60 mL/min (ref >60)
GFR, EST NON AFRICAN AMERICAN: 59 mL/min — AB (ref >60)
GLUCOSE: 115 mg/dL — AB (ref 65–99)
POTASSIUM: 4.6 mmol/L (ref 3.5–5.1)
Sodium: 138 mmol/L (ref 135–145)

## 2017-03-05 LAB — MAGNESIUM: Magnesium: 2 mg/dL (ref 1.7–2.4)

## 2017-03-05 MED ORDER — LEVOFLOXACIN 500 MG PO TABS: 500.0000 mg | ORAL_TABLET | ORAL | 0 refills | Status: AC

## 2017-03-05 MED ORDER — PREDNISONE 10 MG PO TABS: ORAL_TABLET | ORAL | 0 refills | Status: DC

## 2017-03-05 NOTE — Discharge Summary (Signed)
[facility] - [place] at [facility]   PATIENT NAME: [name]    MR#:  [id number]  DATE OF BIRTH:  11/10/1950  DATE OF ADMISSION:  03/01/2017 ADMITTING PHYSICIAN: Auburn Bilberry, MD  DATE OF DISCHARGE: 03/05/2017  PRIMARY CARE PHYSICIAN: Sherrie Mustache    ADMISSION DIAGNOSIS:  Healthcare-associated pneumonia [J18.9] COPD exacerbation (HCC) [J44.1] Sepsis, due to unspecified organism (HCC) [A41.9]  DISCHARGE DIAGNOSIS:  Active Problems:   Acute respiratory failure (HCC)   COPD exacerbation (HCC)   Healthcare-associated pneumonia   Lower extremity edema   SECONDARY DIAGNOSIS:   Past Medical History:  Diagnosis Date  . Asthma   . COPD (chronic obstructive pulmonary disease) (HCC)   . Heart attack (HCC)   . High blood pressure   . High cholesterol   . Scoliosis   . Seasonal allergies   . Sleep apnea      HOSPITAL COURSE:   66 yo male with PMH of COPD, Chronic Resp. Failure, hx of previous MI, HTN, Hyperlipidemia, OSA, who presented to the hospital due to shortness of breath and noted to be in COPD Exacerbation.   1. Acute on Chronic Resp. Failure with Hypoxia and Hypercapnia - due to COPD Exacerbation.  - pt. Was placed on Bipap and started on IV steroids, duonebs, Pulmicort nebs And also placed on antibiotics with Levaquin. -Patient has clinically improved and now has been weaned off the BiPAP and is currently on nasal cannula. His wheezing and shortness of breath has significantly improved. He's being discharged on oral prednisone taper and oral Levaquin for a few more days. -He already has oxygen at home and will continue that upon discharge.  2. COPD exacerbation-this was the cause of patient's acute respiratory failure with hypoxia and hypercapnia. he was treated aggressively with IV steroids, scheduled DuoNeb's, Pulmicort nebs. He has likely improved with less wheezing and bronchospasm, weaned off the BiPAP and is currently on nasal cannula. He's  being discharged on oral prednisone taper, and oral Levaquin. -He is already on chronic oxygen at home.  3. Obstructive sleep apnea-patient will continue his CPAP at bedtime point discharge.  4. Diabetes type II without complication-on the hospital patient was on sliding scale insulin. Patient will resume his Victoza and metformin upon discharge.  5. BPH-patient will resume his Flomax.  6. Diabetic neuropathy-patient will resume his Lyrica upon discharge.  She was seen by physical therapy who recommended short-term rehabilitation, but the patient refuses to go to a rehabilitation facility. He will be discharged with home health services which she already has.  DISCHARGE CONDITIONS:   Stable  CONSULTS OBTAINED:    DRUG ALLERGIES:   Allergies  Allergen Reactions  . Metformin And Related Other (See Comments)    "shakes" per pt  . Morphine And Related Other (See Comments)    Hallucinations, sweats    DISCHARGE MEDICATIONS:   Allergies as of 03/05/2017      Reactions   Metformin And Related Other (See Comments)   "shakes" per pt   Morphine And Related Other (See Comments)   Hallucinations, sweats      Medication List    STOP taking these medications   amoxicillin-clavulanate 875-125 MG tablet Commonly known as:  AUGMENTIN   celecoxib 200 MG capsule Commonly known as:  CELEBREX   glipiZIDE 5 MG 24 hr tablet Commonly known as:  GLUCOTROL XL   oxyCODONE 5 MG immediate release tablet Commonly known as:  ROXICODONE     TAKE these medications   aspirin EC  81 MG tablet Take 81 mg by mouth daily.   cetirizine 10 MG tablet Commonly known as:  ZYRTEC Take 10 mg by mouth daily.   clopidogrel 75 MG tablet Commonly known as:  PLAVIX Take 75 mg by mouth daily.   ferrous sulfate 325 (65 FE) MG EC tablet Take 325 mg by mouth daily with breakfast.   furosemide 40 MG tablet Commonly known as:  LASIX Take 40 mg by mouth 2 (two) times daily.   levofloxacin 500 MG  tablet Commonly known as:  LEVAQUIN Take 1 tablet (500 mg total) by mouth daily.   nebivolol 10 MG tablet Commonly known as:  BYSTOLIC Take 10 mg by mouth every morning. *Note dose*   omeprazole 20 MG capsule Commonly known as:  PRILOSEC Take 20 mg by mouth daily.   ondansetron 4 MG disintegrating tablet Commonly known as:  ZOFRAN ODT Take 1 tablet (4 mg total) by mouth every 8 (eight) hours as needed for nausea or vomiting.   OXYGEN Inhale 2 L into the lungs continuous.   polyethylene glycol powder powder Commonly known as:  GLYCOLAX/MIRALAX Dissolve one heaping tablespoon in 4-8 ounces of juice or water and drink once daily.   predniSONE 10 MG tablet Commonly known as:  DELTASONE Label  & dispense according to the schedule below. 5 Pills PO for 1 day then, 4 Pills PO for 1 day, 3 Pills PO for 1 day, 2 Pills PO for 1 day, 1 Pill PO for 1 days then STOP.   pregabalin 50 MG capsule Commonly known as:  LYRICA Take 50 mg by mouth 2 (two) times daily.   rosuvastatin 10 MG tablet Commonly known as:  CRESTOR Take 10 mg by mouth daily. Reported on 04/22/2016   tamsulosin 0.4 MG Caps capsule Commonly known as:  FLOMAX Take 0.4 mg by mouth daily.   telmisartan 80 MG tablet Commonly known as:  MICARDIS Take 80 mg by mouth daily.   traMADol 50 MG tablet Commonly known as:  ULTRAM Take 1 tablet (50 mg total) by mouth every 6 (six) hours as needed.   VICTOZA 18 MG/3ML Sopn Generic drug:  liraglutide Inject 1.8 mg into the skin daily.   Vitamin D (Ergocalciferol) 50000 units Caps capsule Commonly known as:  DRISDOL Take 50,000 Units by mouth every 7 (seven) days.         DISCHARGE INSTRUCTIONS:   DIET:  Cardiac diet and Diabetic diet  DISCHARGE CONDITION:  Stable  ACTIVITY:  Activity as tolerated  OXYGEN:  Home Oxygen: Yes.     Oxygen Delivery: 2 liters/min via Patient connected to nasal cannula oxygen  DISCHARGE LOCATION:  Home with home health physical  therapy and nursing services.   If you experience worsening of your admission symptoms, develop shortness of breath, life threatening emergency, suicidal or homicidal thoughts you must seek medical attention immediately by calling 911 or calling your MD immediately  if symptoms less severe.  You Must read complete instructions/literature along with all the possible adverse reactions/side effects for all the Medicines you take and that have been prescribed to you. Take any new Medicines after you have completely understood and accpet all the possible adverse reactions/side effects.   Please note  You were cared for by a hospitalist during your hospital stay. If you have any questions about your discharge medications or the care you received while you were in the hospital after you are discharged, you can call the unit and asked to speak with the hospitalist  on call if the hospitalist that took care of you is not available. Once you are discharged, your primary care physician will handle any further medical issues. Please note that NO REFILLS for any discharge medications will be authorized once you are discharged, as it is imperative that you return to your primary care physician (or establish a relationship with a primary care physician if you do not have one) for your aftercare needs so that they can reassess your need for medications and monitor your lab values.     Today   Patient sitting up in the chair, shortness of breath much improved. No cough, no fever, chills and no other complaints.  VITAL SIGNS:  Blood pressure (!) 147/92, pulse 99, temperature 98.1 F (36.7 C), temperature source Oral, resp. rate 17, height  (1.753 m), weight (!) 145.2 kg (320 lb), SpO2 93 %.  I/O:   Intake/Output Summary (Last 24 hours) at 03/05/17 1526 Last data filed at 03/05/17 1200  Gross per 24 hour  Intake             1220 ml  Output             2050 ml  Net             -830 ml    PHYSICAL  EXAMINATION:  GENERAL:  66 y.o.-year-old obese patient sitting up in the bed in no acute distress.  EYES: Pupils equal, round, reactive to light and accommodation. No scleral icterus. Extraocular muscles intact.  HEENT: Head atraumatic, normocephalic. Oropharynx and nasopharynx clear.  NECK:  Supple, no jugular venous distention. No thyroid enlargement, no tenderness.  LUNGS: Normal breath sounds bilaterally, no wheezing, rales,rhonchi. No use of accessory muscles of respiration.  CARDIOVASCULAR: S1, S2 normal. No murmurs, rubs, or gallops.  ABDOMEN: Soft, non-tender, non-distended. Bowel sounds present. No organomegaly or mass.  EXTREMITIES: No pedal edema, cyanosis, or clubbing.  NEUROLOGIC: Cranial nerves II through XII are intact. No focal motor or sensory defecits b/l. Globally weak. PSYCHIATRIC: The patient is alert and oriented x 3.   SKIN: No obvious rash, lesion, or ulcer.   DATA REVIEW:   CBC  Recent Labs Lab 03/04/17 0050  WBC 6.4  HGB 12.4*  HCT 38.6*  PLT 132*    Chemistries   Recent Labs Lab 03/01/17 2005  03/05/17 0458  NA 131*  < > 138  K 3.6  < > 4.6  CL 86*  < > 89*  CO2 39*  < > 43*  GLUCOSE 185*  < > 115*  BUN 22*  < > 24*  CREATININE 1.46*  < > 1.25*  CALCIUM 8.6*  < > 9.0  MG  --   < > 2.0  AST 28  --   --   ALT 14*  --   --   ALKPHOS 86  --   --   BILITOT 1.1  --   --   < > = values in this interval not displayed.  Cardiac Enzymes  Recent Labs Lab 03/01/17 1840  TROPONINI <0.03    Microbiology Results  Results for orders placed or performed during the hospital encounter of 03/01/17  Blood Culture (routine x 2)     Status: None (Preliminary result)   Collection Time: 03/01/17  6:40 PM  Result Value Ref Range Status   Specimen Description BLOOD L WRIST  Final   Special Requests Blood Culture adequate volume  Final   Culture NO GROWTH 4 DAYS  Final  Report Status PENDING  Incomplete  Blood Culture (routine x 2)     Status: None  (Preliminary result)   Collection Time: 03/01/17  6:41 PM  Result Value Ref Range Status   Specimen Description BLOOD R AC  Final   Special Requests Blood Culture adequate volume  Final   Culture NO GROWTH 4 DAYS  Final   Report Status PENDING  Incomplete  MRSA PCR Screening     Status: None   Collection Time: 03/01/17 10:29 PM  Result Value Ref Range Status   MRSA by PCR NEGATIVE NEGATIVE Final    Comment:        The GeneXpert MRSA Assay (FDA approved for NASAL specimens only), is one component of a comprehensive MRSA colonization surveillance program. It is not intended to diagnose MRSA infection nor to guide or monitor treatment for MRSA infections.   Urine culture     Status: Abnormal   Collection Time: 03/01/17 10:52 PM  Result Value Ref Range Status   Specimen Description URINE, RANDOM  Final   Special Requests NONE  Final   Culture (A)  Final    <10,000 COLONIES/mL INSIGNIFICANT GROWTH Performed at [facility], [address], [place], [place] [address]    Report Status 03/03/2017 FINAL  Final    RADIOLOGY:  Dg Chest Port 1 View  Result Date: 03/05/2017 CLINICAL DATA:  Pulmonary infiltrate EXAM: PORTABLE CHEST 1 VIEW COMPARISON:  March 01, 2017 FINDINGS: There remains patchy atelectasis in the lung bases. A small amount of airspace consolidation is present in the left base. Lungs elsewhere clear. Heart is upper normal in size with pulmonary vascularity within normal limits. There is aortic atherosclerosis. No bone lesions. IMPRESSION: Bibasilar atelectasis with probable small focus of pneumonia left base. Lungs elsewhere clear. There is aortic atherosclerosis. Electronically Signed   By: Bretta Bang III M.D.   On: 03/05/2017 07:20      Management plans discussed with the patient, family and they are in agreement.  CODE STATUS:     Code Status Orders        Start     Ordered   03/01/17 2233  Full code  Continuous     03/01/17 2232    Code  Status History    Date Active Date Inactive Code Status Order ID Comments User Context   04/17/2016  3:47 PM 04/20/2016  7:25 PM Full Code [id number]  Marguarite Arbour, MD Inpatient      TOTAL TIME TAKING CARE OF THIS PATIENT: 40 minutes.    Houston Siren M.D on 03/05/2017 at 3:26 PM  Between 7am to 6pm - Pager - [phone]  After 6pm go to [email or web address] - [facility]ist[facility]  Office  [phone]  CC: Primary care physician; Sherrie Mustache

## 2017-03-05 NOTE — Evaluation (Signed)
Physical Therapy Evaluation Patient Details Name: [name] MRN: [id number] DOB: 02-Dec-1950 Today's Date: 03/05/2017   History of Present Illness  Pt is a 66 y.o. male presenting to hospital with SOB and cough x1 week.  Pt admitted to hospital with acute on chronic hypercarbic respiratory failure secondary to COPD exacerbation.  Pt initially monitored in CCU on BiPAP but now transitioned to [place].  PMH includes COPD, heart attack, CABG, scoliosis, R CTR, L4-5 back surgery.  Clinical Impression  Prior to hospital admission, pt was ambulating household distances with rollator (on 2-3 L chronic home O2).  Pt lives with his wife and family in 1 level home with ramp to enter.  Currently pt is mod assist to stand from recliner on 2nd attempt (pt stood and fell backwards into chair 1st attempt standing requiring max assist of therapist to control descent; pt normally uses lift chair or manual w/c at home for sitting surfaces).  Pt able to ambulate 40 feet with RW with min assist (and chair follow) but pt's HR increased from 90 (at rest) to 133 bpm and O2 decreased from 93% (at rest) to 82% on 6 L supplemental O2 via nasal cannula (question quality of waveform on oxygen reading but pt was very SOB with this activity requiring sitting rest break).  End of session sitting in recliner, pt's HR 101 bpm, BP 147/92, and O2 94% on 5 L O2 via nasal cannula  Pt would benefit from skilled PT to address noted impairments and functional limitations.  Recommend pt discharge to [facility] when medically appropriate.    Follow Up Recommendations SNF    Equipment Recommendations  Rolling walker with 5" wheels (pt already has RW at home)    Recommendations for Other Services       Precautions / Restrictions Precautions Precautions: Fall Restrictions Weight Bearing Restrictions: No      Mobility  Bed Mobility               General bed mobility comments: Deferred d/t pt sitting up in chair upon arrival and pt  reports he does not sleep in bed at home.  Transfers Overall transfer level: Needs assistance Equipment used: Rolling walker (2 wheeled) Transfers: Sit to/from Stand Sit to Stand: Mod assist         General transfer comment: assist to initiate and come to full stand from recliner (pt reports this is lower chair than what he sits in at home)  Ambulation/Gait Ambulation/Gait assistance: Min assist Ambulation Distance (Feet): 40 Feet Assistive device: Rolling walker (2 wheeled)   Gait velocity: mildly decreased   General Gait Details: almost step through gait pattern; forward flexed posture pushing RW too far in front requiring consistent vc's to stay closer to RW and for more upright posture; limited distance d/t SOB and fatigue  Stairs            Wheelchair Mobility    Modified Rankin (Stroke Patients Only)       Balance Overall balance assessment: Needs assistance Sitting-balance support: Bilateral upper extremity supported;Feet supported Sitting balance-Leahy Scale: Good Sitting balance - Comments: dynamic sitting reaching within BOS   Standing balance support: Bilateral upper extremity supported (UE support on RW) Standing balance-Leahy Scale: Fair Standing balance comment: static standing                             Pertinent Vitals/Pain Pain Assessment: No/denies pain    Home Living Family/patient expects  to be discharged to:: Private residence Living Arrangements: Spouse/significant other;Other relatives (Wife, 2 son's, and wife's brother) Available Help at Discharge: Family Type of Home: House Home Access: Ramped entrance     Home Layout: One level Home Equipment: Environmental consultant - 2 wheels;Walker - 4 wheels;Other (comment);Wheelchair - manual (home O2)      Prior Function Level of Independence: Independent with assistive device(s);Needs assistance   Gait / Transfers Assistance Needed: Walks household distances with 4ww.  Sleeps in recliner at  home that is a "lift" chair that helps him stand (pt reports he can also get out of w/c he has at home).     Comments: Uses 2 L O2 at rest and 3 L O2 with activity.  Pt denies any falls in past 6 months.  Has PCA 3x/week (usually MWF 12-4pm); pt reports this is his "PT" and does not help him with ADL's.     Hand Dominance        Extremity/Trunk Assessment   Upper Extremity Assessment Upper Extremity Assessment: Generalized weakness    Lower Extremity Assessment Lower Extremity Assessment: Generalized weakness       Communication   Communication: No difficulties  Cognition Arousal/Alertness: Awake/alert Behavior During Therapy: WFL for tasks assessed/performed Overall Cognitive Status: Within Functional Limits for tasks assessed                                        General Comments General comments (skin integrity, edema, etc.): Pt sitting in recliner upon PT arrival.  Nursing cleared pt for participation in physical therapy and present during mobility portion of session (attempted to decrease pt's O2 to 4 L at rest but pt's O2 decreased to 85% sitting in chair so nursing instructed PT to use 6 L O2 with ambulation).  Pt agreeable to PT session and eager to return home soon.     Exercises     Assessment/Plan    PT Assessment Patient needs continued PT services  PT Problem List Decreased strength;Decreased activity tolerance;Decreased mobility;Cardiopulmonary status limiting activity       PT Treatment Interventions DME instruction;Gait training;Functional mobility training;Therapeutic activities;Therapeutic exercise;Balance training;Patient/family education    PT Goals (Current goals can be found in the Care Plan section)  Acute Rehab PT Goals Patient Stated Goal: to go home PT Goal Formulation: With patient Time For Goal Achievement: 03/19/17 Potential to Achieve Goals: Fair    Frequency Min 2X/week   Barriers to discharge Decreased caregiver  support      Co-evaluation               End of Session Equipment Utilized During Treatment: Gait belt;Oxygen (4-6 L O2 via nasal cannula (per discussion with nursing)) Activity Tolerance: Patient limited by fatigue;Other (comment) (Limited d/t SOB with activity) Patient left: in chair;with call bell/phone within reach (pt in recliner without chair alarm upon arrival (no chair alarm in room)) Nurse Communication: Mobility status;Precautions (O2 and HR during session) PT Visit Diagnosis: Muscle weakness (generalized) (M62.81);Difficulty in walking, not elsewhere classified (R26.2)    Time: 4098-1191 PT Time Calculation (min) (ACUTE ONLY): 34 min   Charges:   PT Evaluation $PT Eval Low Complexity: 1 Procedure PT Treatments $Therapeutic Activity: 8-22 mins   PT G CodesHendricks Limes, PT 03/05/17, 2:42 PM [phone]

## 2017-03-05 NOTE — Progress Notes (Signed)
Name: [name] MRN: [id number] DOB: 1951-04-29    ADMISSION DATE:  03/01/2017 CONSULTATION DATE:  03/01/17  REFERRING MD : Dr. Allena Katz   BRIEF PATIENT DESCRIPTION: 66 year old male with sleep apnea  And COPD now presenting with hypercarbic respiratory failure related to COPD exacerbation requiring BiPAP.   Subjective  No acute events overnight. Patient weaned off HFNC to 4l. No chest pain or shortness of breath  PAST MEDICAL HISTORY :   has a past medical history of Asthma; COPD (chronic obstructive pulmonary disease) (HCC); Heart attack (HCC); High blood pressure; High cholesterol; Scoliosis; Seasonal allergies; and Sleep apnea.  has a past surgical history that includes back sx and Carpal tunnel release (Right).    Allergies  Allergen Reactions  . Metformin And Related Other (See Comments)    "shakes" per pt  . Morphine And Related Other (See Comments)    Hallucinations, sweats    VITAL SIGNS: Temp:  [98.1 F (36.7 C)-99.1 F (37.3 C)] 98.1 F (36.7 C) (04/28 1200) Pulse Rate:  [72-99] 99 (04/28 1400) Resp:  [16-25] 17 (04/28 1400) BP: (120-167)/(71-92) 147/92 (04/28 1400) SpO2:  [93 %-100 %] 93 % (04/28 1400) FiO2 (%):  [36 %] 36 % (04/28 0721)  PHYSICAL EXAMINATION: General:  Morbidly obese AA male Neuro:  Awake, Alert, oriented, no focal deficits HEENT:  AT,[place], No JVD Cardiovascular: s1s2,Regular, no m/r/g Lungs:  Diminished bibasilar, no wheezes,crackles, rhonchi noted Abdomen:  Obese, round, +bs, no guarding Musculoskeletal: +LE edema, erythema Skin: warm,dry and intact   Recent Labs Lab 03/03/17 0529 03/03/17 1650 03/04/17 0050 03/05/17 0458  NA 139  --  140 138  K 4.8 4.6 4.1  4.2 4.6  CL 94*  --  92* 89*  CO2 38*  --  41* 43*  BUN 26*  --  24* 24*  CREATININE 1.08  --  1.02 1.25*  GLUCOSE 124*  --  118* 115*    Recent Labs Lab 03/01/17 2005 03/02/17 0617 03/04/17 0050  HGB 12.2* 11.9* 12.4*  HCT 38.3* 37.5* 38.6*  WBC 9.3 6.2  6.4  PLT 132* 119* 132*    Current Facility-Administered Medications:  .  0.9 %  sodium chloride infusion, 250 mL, Intravenous, PRN, Auburn Bilberry, MD, Last Rate: 10 mL/hr at 03/05/17 1200, 250 mL at 03/05/17 1200 .  acetaminophen (TYLENOL) tablet 650 mg, 650 mg, Oral, Q6H PRN, 650 mg at 03/02/17 2206 **OR** acetaminophen (TYLENOL) suppository 650 mg, 650 mg, Rectal, Q6H PRN, Auburn Bilberry, MD .  aspirin EC tablet 81 mg, 81 mg, Oral, Daily, Auburn Bilberry, MD, 81 mg at 03/05/17 1003 .  budesonide (PULMICORT) nebulizer solution 0.25 mg, 0.25 mg, Nebulization, BID, Auburn Bilberry, MD, 0.25 mg at 03/05/17 0721 .  clopidogrel (PLAVIX) tablet 75 mg, 75 mg, Oral, Daily, Auburn Bilberry, MD, 75 mg at 03/05/17 1003 .  enoxaparin (LOVENOX) injection 40 mg, 40 mg, Subcutaneous, BID, Valentina Gu, RPH, 40 mg at 03/05/17 1003 .  fluticasone (FLONASE) 50 MCG/ACT nasal spray 1 spray, 1 spray, Each Nare, Daily, Eugenie Norrie, NP, 1 spray at 03/05/17 1007 .  furosemide (LASIX) injection 40 mg, 40 mg, Intravenous, BID, Dorothyann Gibbs, MD, 40 mg at 03/05/17 0820 .  insulin aspart (novoLOG) injection 0-15 Units, 0-15 Units, Subcutaneous, TID WC, Eugenie Norrie, NP, 2 Units at 03/05/17 1258 .  insulin aspart (novoLOG) injection 0-5 Units, 0-5 Units, Subcutaneous, QHS, Eugenie Norrie, NP .  ipratropium-albuterol (DUONEB) 0.5-2.5 (3) MG/3ML nebulizer solution 3 mL, 3  mL, Nebulization, Q6H, Auburn Bilberry, MD, 3 mL at 03/05/17 1420 .  levofloxacin (LEVAQUIN) tablet 500 mg, 500 mg, Oral, Q24H, Altamese Dilling, MD, 500 mg at 03/04/17 1751 .  oxyCODONE (Oxy IR/ROXICODONE) immediate release tablet 5 mg, 5 mg, Oral, Q6H PRN, Bincy S Varughese, NP, 5 mg at 03/04/17 2132 .  pantoprazole (PROTONIX) EC tablet 40 mg, 40 mg, Oral, Daily, Auburn Bilberry, MD, 40 mg at 03/05/17 1003 .  potassium chloride SA (K-DUR,KLOR-CON) CR tablet 20 mEq, 20 mEq, Oral, TID, Shane Crutch, MD, 20 mEq at 03/05/17 1003 .  predniSONE  (DELTASONE) tablet 40 mg, 40 mg, Oral, Q breakfast, Shane Crutch, MD, 40 mg at 03/05/17 0820 .  rosuvastatin (CRESTOR) tablet 10 mg, 10 mg, Oral, Daily, Auburn Bilberry, MD, 10 mg at 03/04/17 1751 .  sodium chloride flush (NS) 0.9 % injection 3 mL, 3 mL, Intravenous, Q12H, Auburn Bilberry, MD, 3 mL at 03/05/17 1004 .  sodium chloride flush (NS) 0.9 % injection 3 mL, 3 mL, Intravenous, Q12H, Auburn Bilberry, MD, 3 mL at 03/05/17 1004 .  sodium chloride flush (NS) 0.9 % injection 3 mL, 3 mL, Intravenous, PRN, Auburn Bilberry, MD .  tamsulosin (FLOMAX) capsule 0.4 mg, 0.4 mg, Oral, Daily, Auburn Bilberry, MD, 0.4 mg at 03/05/17 1003  STUDIES:  04/19/16 ECHO>>Left ventricle: The cavity size was normal. Systolic function was  normal. The estimated ejection fraction was in the range of 60%  to 65%.    ASSESSMENT / PLAN: Acute On Chronic Hypercarbic hypoxic respiratory failure 2 to COPD Exacerbation / Suspected pneumonia. Fluid overload Acute kidney injury, improved  Afib -controlled. OHS   Plan Off HFNC. Wean O2 as tolerated to maintain sats >89%.  On levaquin. BD. Oral steroids. BIPAP prn/qHS. Can transfer out from ICU if okay with primary team.   Dorothyann Gibbs, M.D. 03/05/2017

## 2017-03-05 NOTE — Progress Notes (Signed)
Patient resting intermittently on bipap overnight. Complaints of pain relieved with oxycodone.  Patient continues to refuse baths and turn. Encouraged to move in bed on own.  Will continue to monitor for change/patient need.

## 2017-03-05 NOTE — Evaluation (Signed)
Clinical/Bedside Swallow Evaluation Patient Details  Name: [name] MRN: [id number] Date of Birth: [birth year]  Today's Date: 03/05/2017 Time: SLP Start Time (ACUTE ONLY): 0905 SLP Stop Time (ACUTE ONLY): 0937 SLP Time Calculation (min) (ACUTE ONLY): 32 min  Past Medical History:  Past Medical History:  Diagnosis Date  . Asthma   . COPD (chronic obstructive pulmonary disease) (HCC)   . Heart attack (HCC)   . High blood pressure   . High cholesterol   . Scoliosis   . Seasonal allergies   . Sleep apnea    Past Surgical History:  Past Surgical History:  Procedure Laterality Date  . back sx     L4 L5   . CARPAL TUNNEL RELEASE Right    HPI:      Assessment / Plan / Recommendation Clinical Impression  pt presents with no significant risk for aspiration as characterized by no overt ssx of aspiration with trials of solids, or when presented with thin liquids via cup or straw. pt had no change in vitals or affect during intake, no coughing, throat clear or wet vocal quality noted with any trial. NSG notified and stated she did not see dificits with swallwoing as well. SLP educated nsg to notify st if changes in condition occur or difficulties begin.  SLP Visit Diagnosis: Dysphagia, oropharyngeal phase (R13.12)    Aspiration Risk  No limitations    Diet Recommendation Regular;Thin liquid   Liquid Administration via: Cup;Straw Medication Administration: Whole meds with liquid Supervision: Patient able to self feed Compensations: Slow rate;Small sips/bites;Follow solids with liquid Postural Changes: Seated upright at 90 degrees    Other  Recommendations Oral Care Recommendations: Patient independent with oral care   Follow up Recommendations        Frequency and Duration            Prognosis        Swallow Study   General Date of Onset: 03/04/17 Type of Study: Bedside Swallow Evaluation Diet Prior to this Study: Regular;Thin liquids Temperature Spikes Noted:  N/A Respiratory Status: Nasal cannula History of Recent Intubation: No Behavior/Cognition: Alert;Cooperative;Pleasant mood Oral Cavity Assessment: Within Functional Limits Oral Care Completed by SLP: Yes Oral Cavity - Dentition: Adequate natural dentition Vision: Functional for self-feeding Self-Feeding Abilities: Able to feed self Patient Positioning: Upright in bed Baseline Vocal Quality: Normal Volitional Cough: Strong Volitional Swallow: Able to elicit    Oral/Motor/Sensory Function Overall Oral Motor/Sensory Function: Within functional limits   Ice Chips Ice chips: Within functional limits Presentation: Self Fed;Spoon   Thin Liquid Thin Liquid: Within functional limits Presentation: Self Fed;Spoon;Straw    Nectar Thick Nectar Thick Liquid: Not tested   Honey Thick Honey Thick Liquid: Not tested   Puree Puree: Within functional limits Presentation: Self Fed;Spoon   Solid   GO   Solid: Within functional limits Presentation: Self Fed;Spoon        Meredith Pel Jannetta Quint 03/05/2017,11:06 AM

## 2017-03-05 NOTE — Progress Notes (Signed)
MEDICATION RELATED CONSULT NOTE   Pharmacy Consult for electrolyte management   Pharmacy consulted for electrolyte management for 66 yo male admitted with acute respiratory failure. Patient is currently receiving furosemide  IV BID and potassium PO TID.   Plan:  Electrolytes WNL, no additional supplementation at this time. Recheck with AM labs.    Allergies  Allergen Reactions  . Metformin And Related Other (See Comments)    "shakes" per pt  . Morphine And Related Other (See Comments)    Hallucinations, sweats    Patient Measurements: Height:  (175.3 cm) Weight: (!) 320 lb (145.2 kg) IBW/kg (Calculated) : 70.7   Vital Signs: Temp: 98.1 F (36.7 C) (04/28 0000) Temp Source: Oral (04/28 0000) BP: 141/78 (04/28 0600) Pulse Rate: 72 (04/28 0600) Intake/Output from previous day: 04/27 0701 - 04/28 0700 In: 604.2 [P.O.:580; I.V.:24.2] Out: 3500 [Urine:3500] Intake/Output from this shift: No intake/output data recorded.  Labs:  Recent Labs  03/03/17 0529 03/04/17 0050 03/05/17 0458  WBC  --  6.4  --   HGB  --  12.4*  --   HCT  --  38.6*  --   PLT  --  132*  --   CREATININE 1.08 1.02 1.25*  MG  --  1.6* 2.0   Estimated Creatinine Clearance: 83.8 mL/min (A) (by C-G formula based on SCr of 1.25 mg/dL (H)).   Pharmacy will continue to monitor and adjust per consult.   Desean Heemstra C 03/05/2017,9:39 AM

## 2017-03-06 LAB — CULTURE, BLOOD (ROUTINE X 2)
Culture: NO GROWTH
Culture: NO GROWTH
Special Requests: ADEQUATE
Special Requests: ADEQUATE

## 2017-03-18 ENCOUNTER — Telehealth: Payer: Self-pay

## 2017-03-18 ENCOUNTER — Encounter: Payer: Self-pay | Admitting: Emergency Medicine

## 2017-03-18 ENCOUNTER — Inpatient Hospital Stay
Admission: EM | Admit: 2017-03-18 | Discharge: 2017-03-26 | DRG: 193 | Disposition: A | Discharge status: Home Health | Payer: Medicare Other | Attending: Internal Medicine | Admitting: Internal Medicine

## 2017-03-18 ENCOUNTER — Emergency Department: Payer: Medicare Other

## 2017-03-18 DIAGNOSIS — Z7982 Long term (current) use of aspirin: Secondary | ICD-10-CM

## 2017-03-18 DIAGNOSIS — E1122 Type 2 diabetes mellitus with diabetic chronic kidney disease: Secondary | ICD-10-CM | POA: Diagnosis present

## 2017-03-18 DIAGNOSIS — Z91048 Other nonmedicinal substance allergy status: Secondary | ICD-10-CM

## 2017-03-18 DIAGNOSIS — I4891 Unspecified atrial fibrillation: Secondary | ICD-10-CM | POA: Diagnosis not present

## 2017-03-18 DIAGNOSIS — I130 Hypertensive heart and chronic kidney disease with heart failure and stage 1 through stage 4 chronic kidney disease, or unspecified chronic kidney disease: Secondary | ICD-10-CM | POA: Diagnosis present

## 2017-03-18 DIAGNOSIS — J441 Chronic obstructive pulmonary disease with (acute) exacerbation: Secondary | ICD-10-CM | POA: Diagnosis present

## 2017-03-18 DIAGNOSIS — Y95 Nosocomial condition: Secondary | ICD-10-CM | POA: Diagnosis present

## 2017-03-18 DIAGNOSIS — J9621 Acute and chronic respiratory failure with hypoxia: Secondary | ICD-10-CM | POA: Diagnosis present

## 2017-03-18 DIAGNOSIS — J440 Chronic obstructive pulmonary disease with acute lower respiratory infection: Secondary | ICD-10-CM | POA: Diagnosis present

## 2017-03-18 DIAGNOSIS — Z8249 Family history of ischemic heart disease and other diseases of the circulatory system: Secondary | ICD-10-CM

## 2017-03-18 DIAGNOSIS — J159 Unspecified bacterial pneumonia: Secondary | ICD-10-CM | POA: Diagnosis present

## 2017-03-18 DIAGNOSIS — T501X5A Adverse effect of loop [high-ceiling] diuretics, initial encounter: Secondary | ICD-10-CM | POA: Diagnosis present

## 2017-03-18 DIAGNOSIS — I5033 Acute on chronic diastolic (congestive) heart failure: Secondary | ICD-10-CM | POA: Diagnosis present

## 2017-03-18 DIAGNOSIS — N183 Chronic kidney disease, stage 3 unspecified: Secondary | ICD-10-CM

## 2017-03-18 DIAGNOSIS — I2510 Atherosclerotic heart disease of native coronary artery without angina pectoris: Secondary | ICD-10-CM | POA: Diagnosis present

## 2017-03-18 DIAGNOSIS — G4733 Obstructive sleep apnea (adult) (pediatric): Secondary | ICD-10-CM | POA: Diagnosis not present

## 2017-03-18 DIAGNOSIS — J189 Pneumonia, unspecified organism: Secondary | ICD-10-CM | POA: Diagnosis present

## 2017-03-18 DIAGNOSIS — I252 Old myocardial infarction: Secondary | ICD-10-CM | POA: Diagnosis not present

## 2017-03-18 DIAGNOSIS — M419 Scoliosis, unspecified: Secondary | ICD-10-CM | POA: Diagnosis present

## 2017-03-18 DIAGNOSIS — Z9981 Dependence on supplemental oxygen: Secondary | ICD-10-CM

## 2017-03-18 DIAGNOSIS — E662 Morbid (severe) obesity with alveolar hypoventilation: Secondary | ICD-10-CM | POA: Diagnosis present

## 2017-03-18 DIAGNOSIS — R0902 Hypoxemia: Secondary | ICD-10-CM | POA: Diagnosis not present

## 2017-03-18 DIAGNOSIS — J9612 Chronic respiratory failure with hypercapnia: Secondary | ICD-10-CM | POA: Diagnosis present

## 2017-03-18 DIAGNOSIS — R0602 Shortness of breath: Secondary | ICD-10-CM

## 2017-03-18 DIAGNOSIS — E875 Hyperkalemia: Secondary | ICD-10-CM | POA: Diagnosis present

## 2017-03-18 DIAGNOSIS — I4819 Other persistent atrial fibrillation: Secondary | ICD-10-CM

## 2017-03-18 DIAGNOSIS — J449 Chronic obstructive pulmonary disease, unspecified: Secondary | ICD-10-CM | POA: Diagnosis not present

## 2017-03-18 DIAGNOSIS — Z7902 Long term (current) use of antithrombotics/antiplatelets: Secondary | ICD-10-CM

## 2017-03-18 DIAGNOSIS — Z6841 Body Mass Index (BMI) 40.0 and over, adult: Secondary | ICD-10-CM | POA: Diagnosis not present

## 2017-03-18 DIAGNOSIS — I481 Persistent atrial fibrillation: Secondary | ICD-10-CM | POA: Diagnosis present

## 2017-03-18 DIAGNOSIS — I272 Pulmonary hypertension, unspecified: Secondary | ICD-10-CM | POA: Diagnosis present

## 2017-03-18 DIAGNOSIS — N170 Acute kidney failure with tubular necrosis: Secondary | ICD-10-CM

## 2017-03-18 DIAGNOSIS — I959 Hypotension, unspecified: Secondary | ICD-10-CM | POA: Diagnosis present

## 2017-03-18 DIAGNOSIS — Z79899 Other long term (current) drug therapy: Secondary | ICD-10-CM

## 2017-03-18 DIAGNOSIS — E7800 Pure hypercholesterolemia, unspecified: Secondary | ICD-10-CM | POA: Diagnosis present

## 2017-03-18 DIAGNOSIS — I270 Primary pulmonary hypertension: Secondary | ICD-10-CM | POA: Diagnosis not present

## 2017-03-18 DIAGNOSIS — Z87442 Personal history of urinary calculi: Secondary | ICD-10-CM

## 2017-03-18 DIAGNOSIS — N179 Acute kidney failure, unspecified: Secondary | ICD-10-CM

## 2017-03-18 LAB — CBC WITH DIFFERENTIAL/PLATELET
BASOS ABS: 0.1 10*3/uL (ref 0–0.1)
BASOS PCT: 1 %
EOS PCT: 3 %
Eosinophils Absolute: 0.2 10*3/uL (ref 0–0.7)
HCT: 40.1 % (ref 40.0–52.0)
Hemoglobin: 12.8 g/dL — ABNORMAL LOW (ref 13.0–18.0)
LYMPHS PCT: 19 %
Lymphs Abs: 1.3 10*3/uL (ref 1.0–3.6)
MCH: 28.8 pg (ref 26.0–34.0)
MCHC: 31.9 g/dL — ABNORMAL LOW (ref 32.0–36.0)
MCV: 90.5 fL (ref 80.0–100.0)
Monocytes Absolute: 0.5 10*3/uL (ref 0.2–1.0)
Monocytes Relative: 7 %
NEUTROS ABS: 4.8 10*3/uL (ref 1.4–6.5)
Neutrophils Relative %: 70 %
PLATELETS: 125 10*3/uL — AB (ref 150–440)
RBC: 4.43 MIL/uL (ref 4.40–5.90)
RDW: 14.5 % (ref 11.5–14.5)
WBC: 6.9 10*3/uL (ref 3.8–10.6)

## 2017-03-18 LAB — BASIC METABOLIC PANEL
ANION GAP: 9 (ref 5–15)
BUN: 36 mg/dL — ABNORMAL HIGH (ref 6–20)
CALCIUM: 9 mg/dL (ref 8.9–10.3)
CO2: 34 mmol/L — ABNORMAL HIGH (ref 22–32)
Chloride: 97 mmol/L — ABNORMAL LOW (ref 101–111)
Creatinine, Ser: 1.61 mg/dL — ABNORMAL HIGH (ref 0.61–1.24)
GFR calc Af Amer: 50 mL/min — ABNORMAL LOW (ref >60)
GFR, EST NON AFRICAN AMERICAN: 43 mL/min — AB (ref >60)
GLUCOSE: 142 mg/dL — AB (ref 65–99)
POTASSIUM: 4.7 mmol/L (ref 3.5–5.1)
Sodium: 140 mmol/L (ref 135–145)

## 2017-03-18 LAB — TROPONIN I

## 2017-03-18 LAB — GLUCOSE, CAPILLARY: GLUCOSE-CAPILLARY: 166 mg/dL — AB (ref 65–99)

## 2017-03-18 MED ORDER — PREGABALIN 50 MG PO CAPS
50.0000 mg | ORAL_CAPSULE | Freq: Two times a day (BID) | ORAL | Status: DC
  Administered 2017-03-18 – 2017-03-26 (×16): 50 mg via ORAL
  Filled 2017-03-18 (×16): qty 1

## 2017-03-18 MED ORDER — ONDANSETRON 4 MG PO TBDP
4.0000 mg | ORAL_TABLET | Freq: Three times a day (TID) | ORAL | Status: DC | PRN
  Filled 2017-03-18: qty 1

## 2017-03-18 MED ORDER — FUROSEMIDE 40 MG PO TABS
40.0000 mg | ORAL_TABLET | Freq: Two times a day (BID) | ORAL | Status: DC
  Administered 2017-03-19: 40 mg via ORAL
  Filled 2017-03-18 (×2): qty 1

## 2017-03-18 MED ORDER — TAMSULOSIN HCL 0.4 MG PO CAPS
0.4000 mg | ORAL_CAPSULE | Freq: Every day | ORAL | Status: DC
  Administered 2017-03-19 – 2017-03-26 (×8): 0.4 mg via ORAL
  Filled 2017-03-18 (×8): qty 1

## 2017-03-18 MED ORDER — LORATADINE 10 MG PO TABS
10.0000 mg | ORAL_TABLET | Freq: Every day | ORAL | Status: DC
  Administered 2017-03-19 – 2017-03-26 (×8): 10 mg via ORAL
  Filled 2017-03-18 (×8): qty 1

## 2017-03-18 MED ORDER — TRAMADOL HCL 50 MG PO TABS
50.0000 mg | ORAL_TABLET | Freq: Four times a day (QID) | ORAL | Status: DC | PRN
  Administered 2017-03-19 – 2017-03-26 (×10): 50 mg via ORAL
  Filled 2017-03-18 (×10): qty 1

## 2017-03-18 MED ORDER — NEBIVOLOL HCL 10 MG PO TABS
10.0000 mg | ORAL_TABLET | ORAL | Status: DC
  Administered 2017-03-19: 10 mg via ORAL
  Filled 2017-03-18: qty 1
  Filled 2017-03-18: qty 2
  Filled 2017-03-18: qty 1

## 2017-03-18 MED ORDER — GUAIFENESIN-DM 100-10 MG/5ML PO SYRP
5.0000 mL | ORAL_SOLUTION | ORAL | Status: DC | PRN
Start: 2017-03-18 — End: 2017-03-26
  Administered 2017-03-18: 5 mL via ORAL
  Filled 2017-03-18: qty 5

## 2017-03-18 MED ORDER — DEXTROSE 5 % IV SOLN
2.0000 g | Freq: Once | INTRAVENOUS | Status: AC
  Administered 2017-03-18: 2 g via INTRAVENOUS
  Filled 2017-03-18: qty 2

## 2017-03-18 MED ORDER — DEXTROSE 5 % IV SOLN: 2.0000 g | Freq: Once | INTRAVENOUS | Status: DC

## 2017-03-18 MED ORDER — CEFEPIME-DEXTROSE 2 GM/50ML IV SOLR
2.0000 g | Freq: Once | INTRAVENOUS | Status: DC
  Filled 2017-03-18: qty 50

## 2017-03-18 MED ORDER — LIRAGLUTIDE 18 MG/3ML ~~LOC~~ SOPN: 1.8000 mg | PEN_INJECTOR | Freq: Every day | SUBCUTANEOUS | Status: DC

## 2017-03-18 MED ORDER — CLOPIDOGREL BISULFATE 75 MG PO TABS
75.0000 mg | ORAL_TABLET | Freq: Every day | ORAL | Status: DC
  Administered 2017-03-19: 75 mg via ORAL
  Filled 2017-03-18: qty 1

## 2017-03-18 MED ORDER — DOCUSATE SODIUM 100 MG PO CAPS: 100.0000 mg | ORAL_CAPSULE | Freq: Two times a day (BID) | ORAL | Status: DC | PRN

## 2017-03-18 MED ORDER — PANTOPRAZOLE SODIUM 40 MG PO TBEC
40.0000 mg | DELAYED_RELEASE_TABLET | Freq: Every day | ORAL | Status: DC
  Administered 2017-03-19 – 2017-03-26 (×8): 40 mg via ORAL
  Filled 2017-03-18 (×8): qty 1

## 2017-03-18 MED ORDER — FERROUS SULFATE 325 (65 FE) MG PO TABS
325.0000 mg | ORAL_TABLET | Freq: Every day | ORAL | Status: DC
  Administered 2017-03-19 – 2017-03-26 (×8): 325 mg via ORAL
  Filled 2017-03-18 (×9): qty 1

## 2017-03-18 MED ORDER — ROSUVASTATIN CALCIUM 10 MG PO TABS
10.0000 mg | ORAL_TABLET | Freq: Every day | ORAL | Status: DC
  Administered 2017-03-19 – 2017-03-26 (×8): 10 mg via ORAL
  Filled 2017-03-18 (×8): qty 1

## 2017-03-18 MED ORDER — VANCOMYCIN HCL IN DEXTROSE 1-5 GM/200ML-% IV SOLN
1000.0000 mg | Freq: Once | INTRAVENOUS | Status: AC
  Administered 2017-03-18: 1000 mg via INTRAVENOUS
  Filled 2017-03-18: qty 200

## 2017-03-18 MED ORDER — ASPIRIN EC 81 MG PO TBEC
81.0000 mg | DELAYED_RELEASE_TABLET | Freq: Every day | ORAL | Status: DC
  Administered 2017-03-19: 81 mg via ORAL
  Filled 2017-03-18: qty 1

## 2017-03-18 MED ORDER — DEXTROSE 5 % IV SOLN
2.0000 g | Freq: Three times a day (TID) | INTRAVENOUS | Status: DC
  Administered 2017-03-19 – 2017-03-20 (×6): 2 g via INTRAVENOUS
  Filled 2017-03-18 (×10): qty 2

## 2017-03-18 MED ORDER — INSULIN ASPART 100 UNIT/ML ~~LOC~~ SOLN
0.0000 [IU] | Freq: Three times a day (TID) | SUBCUTANEOUS | Status: DC
Start: 2017-03-19 — End: 2017-03-22
  Administered 2017-03-19 – 2017-03-20 (×4): 2 [IU] via SUBCUTANEOUS
  Administered 2017-03-20: 3 [IU] via SUBCUTANEOUS
  Administered 2017-03-20: 2 [IU] via SUBCUTANEOUS
  Administered 2017-03-21 (×2): 3 [IU] via SUBCUTANEOUS
  Administered 2017-03-21: 5 [IU] via SUBCUTANEOUS
  Administered 2017-03-22: 3 [IU] via SUBCUTANEOUS
  Administered 2017-03-22: 5 [IU] via SUBCUTANEOUS
  Filled 2017-03-18: qty 2
  Filled 2017-03-18: qty 3
  Filled 2017-03-18: qty 5
  Filled 2017-03-18: qty 3
  Filled 2017-03-18 (×2): qty 2
  Filled 2017-03-18: qty 5
  Filled 2017-03-18 (×2): qty 2
  Filled 2017-03-18 (×2): qty 3

## 2017-03-18 MED ORDER — HEPARIN SODIUM (PORCINE) 5000 UNIT/ML IJ SOLN
5000.0000 [IU] | Freq: Three times a day (TID) | INTRAMUSCULAR | Status: DC
  Administered 2017-03-18 – 2017-03-21 (×8): 5000 [IU] via SUBCUTANEOUS
  Filled 2017-03-18 (×8): qty 1

## 2017-03-18 MED ORDER — VANCOMYCIN HCL 10 G IV SOLR
1250.0000 mg | Freq: Two times a day (BID) | INTRAVENOUS | Status: DC
Start: 2017-03-19 — End: 2017-03-19
  Administered 2017-03-19: 1250 mg via INTRAVENOUS
  Filled 2017-03-18 (×3): qty 1250

## 2017-03-18 MED ORDER — IPRATROPIUM-ALBUTEROL 0.5-2.5 (3) MG/3ML IN SOLN
3.0000 mL | RESPIRATORY_TRACT | Status: DC
  Administered 2017-03-18 – 2017-03-21 (×16): 3 mL via RESPIRATORY_TRACT
  Filled 2017-03-18 (×15): qty 3

## 2017-03-18 NOTE — ED Notes (Signed)
Report from jerri, rn.  

## 2017-03-18 NOTE — Telephone Encounter (Signed)
MA out of office today. Will have her check work in slots on Monday. Patient is currently at the ED.

## 2017-03-18 NOTE — ED Provider Notes (Signed)
[facility] Emergency Department Provider Note   ____________________________________________    I have reviewed the triage vital signs and the nursing notes.   HISTORY  Chief Complaint Cough     HPI [name] is a 66 y.o. male who presents with complaints of cough and mild shortness of breath. Patient was recently admitted to the hospital for pneumonia and acute respiratory distress. He improved on IV antibiotics and was discharged on 5 days of Levaquin and prednisone. Typically at home he uses 2 L of nasal cannula oxygen but since he has been hospitalized he has had to use 5 L. He presents today because he has developed a cough and feels that his breathing may be worse. He attributed this to sleeping underneath a fan. He also feels that he has not had enough antibiotics. Denies chest pain.   Past Medical History:  Diagnosis Date  . Asthma   . COPD (chronic obstructive pulmonary disease) (HCC)   . Heart attack (HCC)   . High blood pressure   . High cholesterol   . Scoliosis   . Seasonal allergies   . Sleep apnea     Patient Active Problem List   Diagnosis Date Noted  . Acute respiratory failure (HCC) 03/01/2017  . COPD exacerbation (HCC)   . Healthcare-associated pneumonia   . Lower extremity edema   . Cellulitis of left leg 04/17/2016  . ARF (acute renal failure) (HCC) 04/17/2016  . Cellulitis of right leg 04/17/2016  . Chronic hypoxemic respiratory failure (HCC) 12/12/2013  . Shortness of breath 11/21/2013  . OSA on CPAP 11/21/2013  . CHF (congestive heart failure) (HCC) 11/21/2013  . CAD (coronary artery disease) of artery bypass graft 11/21/2013    Past Surgical History:  Procedure Laterality Date  . back sx     L4 L5   . CARPAL TUNNEL RELEASE Right     Prior to Admission medications   Medication Sig Start Date End Date Taking? Authorizing Provider  aspirin EC 81 MG tablet Take 81 mg by mouth daily.    [provider]  cetirizine (ZYRTEC) 10 MG tablet Take 10 mg by mouth daily.    [provider]  clopidogrel (PLAVIX) 75 MG tablet Take 75 mg by mouth daily.    [provider]  ferrous sulfate 325 (65 FE) MG EC tablet Take 325 mg by mouth daily with breakfast.    [provider]  furosemide (LASIX) 40 MG tablet Take 40 mg by mouth 2 (two) times daily.     [provider]  Liraglutide (VICTOZA) 18 MG/3ML SOPN Inject 1.8 mg into the skin daily.    [provider]  nebivolol (BYSTOLIC) 10 MG tablet Take 10 mg by mouth every morning. *Note dose*    [provider]  omeprazole (PRILOSEC) 20 MG capsule Take 20 mg by mouth daily.    [provider]  ondansetron (ZOFRAN ODT) 4 MG disintegrating tablet Take 1 tablet (4 mg total) by mouth every 8 (eight) hours as needed for nausea or vomiting. 12/21/15   Gayla Doss, MD  OXYGEN Inhale 2 L into the lungs continuous.    [provider]  polyethylene glycol powder (GLYCOLAX/MIRALAX) powder Dissolve one heaping tablespoon in 4-8 ounces of juice or water and drink once daily. 12/21/15   Gayla Doss, MD  predniSONE (DELTASONE) 10 MG tablet Label  & dispense according to the schedule below. 5 Pills PO for 1 day then, 4  Pills PO for 1 day, 3 Pills PO for 1 day, 2 Pills PO for 1 day, 1 Pill PO for 1 days then STOP. 03/05/17   Sainani, Rolly PancakeVivek J, MD  pregabalin (LYRICA) 50 MG capsule Take 50 mg by mouth 2 (two) times daily.    [provider]  rosuvastatin (CRESTOR) 10 MG tablet Take 10 mg by mouth daily. Reported on 04/22/2016    [provider]  tamsulosin (FLOMAX) 0.4 MG CAPS capsule Take 0.4 mg by mouth daily.    [provider]  telmisartan (MICARDIS) 80 MG tablet Take 80 mg by mouth daily.    [provider]  traMADol (ULTRAM) 50 MG tablet Take 1 tablet (50 mg total) by mouth every 6 (six) hours as needed. Patient not taking: Reported on 03/02/2017 07/13/16  07/13/17  Joni ReiningSmith, Ronald K, PA-C  Vitamin D, Ergocalciferol, (DRISDOL) 50000 units CAPS capsule Take 50,000 Units by mouth every 7 (seven) days.    [provider]     Allergies Metformin and related and Morphine and related  Family History  Problem Relation Age of Onset  . Heart disease Father   . Asthma Father   . Cancer Father   . Heart disease Mother   . Arthritis Mother   . Macular degeneration Brother     Social History Social History  Substance Use Topics  . Smoking status: Never Smoker  . Smokeless tobacco: Never Used  . Alcohol use No    Review of Systems  Constitutional: No fever/chills Eyes: No visual changes.  ENT: No sore throat. Cardiovascular: Denies chest pain. Respiratory: As above Gastrointestinal: No abdominal pain.  No nausea, no vomiting.   Genitourinary: Negative for dysuria. Musculoskeletal: Negative for back pain. Skin: Negative for rash. Neurological: Negative for headaches    ____________________________________________   PHYSICAL EXAM:  VITAL SIGNS: ED Triage Vitals [03/18/17 1332]  Enc Vitals Group     BP 111/68     Pulse Rate 82     Resp 20     Temp 98.7 F (37.1 C)     Temp Source Oral     SpO2 95 %     Weight (!) 348 lb (157.9 kg)     Height 5\' 8"  (1.727 m)     Head Circumference      Peak Flow      Pain Score      Pain Loc      Pain Edu?      Excl. in GC?     Constitutional: Alert and oriented. No acute distress. Pleasant and interactive Eyes: Conjunctivae are normal.  Head: Atraumatic. Nose: No congestion/rhinnorhea. Mouth/Throat: Mucous membranes are moist.   Neck:  Painless ROM Cardiovascular: Normal rate, regular rhythm. Grossly normal heart sounds.  Good peripheral circulation. Respiratory: Mildly increased respiratory effort, no retractions, poor airflow no rales heard Gastrointestinal: Soft and nontender. No distention.  No CVA tenderness. Genitourinary: deferred Musculoskeletal: Warm and well  perfused. Bilateral edema  Neurologic:  Normal speech and language. No gross focal neurologic deficits are appreciated.  Skin:  Skin is warm, dry and intact. No rash noted. Psychiatric: Mood and affect are normal.   ____________________________________________   LABS (all labs ordered are listed, but only abnormal results are displayed)  Labs Reviewed  CBC WITH DIFFERENTIAL/PLATELET - Abnormal; Notable for the following:       Result Value   Hemoglobin 12.8 (*)    MCHC 31.9 (*)    Platelets 125 (*)    All other  components within normal limits  BASIC METABOLIC PANEL - Abnormal; Notable for the following:    Chloride 97 (*)    CO2 34 (*)    Glucose, Bld 142 (*)    BUN 36 (*)    Creatinine, Ser 1.61 (*)    GFR calc non Af Amer 43 (*)    GFR calc Af Amer 50 (*)    All other components within normal limits  CULTURE, BLOOD (ROUTINE X 2)  CULTURE, BLOOD (ROUTINE X 2)  TROPONIN I   ____________________________________________  EKG  ED ECG REPORT I, Jene Every, the attending physician, personally viewed and interpreted this ECG.  Date: 03/18/2017 EKG Time: 1:44 PM Rate: 86 Rhythm: Atrial flutter QRS Axis: normal Intervals: Abnormal ST/T Wave abnormalities: Nonspecific changes Conduction Disturbances: none   ____________________________________________  RADIOLOGY  Chest x-ray shows possibly mildly increased pneumonia ____________________________________________   PROCEDURES  Procedure(s) performed: No    Critical Care performed: No ____________________________________________   INITIAL IMPRESSION / ASSESSMENT AND PLAN / ED COURSE  Pertinent labs & imaging results that were available during my care of the patient were reviewed by me and considered in my medical decision making (see chart for details).  Discussed with patient lab results and chest x-ray. Patient is requiring 5 L nasal cannula and is satting in the low 90s. His chest x-ray suggests that  his pneumonia has not improved in fact it may have worsened. Given this I strongly recommended admission for further IV antibiotics and treatment. Patient has adamantly denied admission, he refuses to come into the hospital. He would like to have additional by mouth antibiotics and states that if he does not get better he will come back.  I informed him that he would be leaving AGAINST MEDICAL ADVICE as I was deeply concerned about him not staying in the hospital. He does have decision capacity and understands that there is a significant risk of morbidity and/or mortality if he leaves  ----------------------------------------- 5:50 PM on 03/18/2017 -----------------------------------------  Patient's wife has shown up and has convinced him that he needs to stay. He has decided to stay. Hence we will send blood cultures and start antibiotics and admit him to the hospital    ____________________________________________   FINAL CLINICAL IMPRESSION(S) / ED DIAGNOSES  Final diagnoses:  HCAP (healthcare-associated pneumonia)  SOB (shortness of breath)      NEW MEDICATIONS STARTED DURING THIS VISIT:  New Prescriptions   No medications on file     Note:  This document was prepared using Dragon voice recognition software and may include unintentional dictation errors.    Jene Every, MD 03/18/17 1750

## 2017-03-18 NOTE — Progress Notes (Signed)
Family Meeting Note  Advance Directive:no  Today a meeting took place with the Patient and spouse.  The following clinical team members were present during this meeting:MD  The following were discussed:Patient's diagnosis: COPD, Pneumonia, respi failure, Patient's progosis: Unable to determine and Goals for treatment: Continue present management  Additional follow-up to be provided: PMD  Time spent during discussion:20 minutes  [name], Heath GoldVAIBHAVKUMAR, MD

## 2017-03-18 NOTE — ED Notes (Signed)
Pt sitting up in chair with family at bedside. Iv pump ringing occlusion, pt is eating, iv moved to different site to support silence in pump. Pt denies needs.

## 2017-03-18 NOTE — ED Triage Notes (Signed)
History of pneumonia and got discharged 1 week ago.  Has now had white productive cough. His doctor sent him for chest xray to make sure PNA has resolved.  Pt denies fevers.  Gets SHOB if up and moving around.  Is on 5 L [place] at baseline.  Biggest concern is cough per pt.  No respiratory distress noted. No pain per pt.  Has also had sinus infection recently.

## 2017-03-18 NOTE — H&P (Signed)
[facility]   PATIENT NAME: Ernest MoutonGordon Platter    MR#:  [id number]  DATE OF BIRTH:  [birth year]  DATE OF ADMISSION:  03/18/2017  PRIMARY CARE PHYSICIAN: Sherrie MustacheJadali, Fayegh   REQUESTING/REFERRING PHYSICIAN: Cyril LoosenKinner  CHIEF COMPLAINT:   Chief Complaint  Patient presents with  . Cough    HISTORY OF PRESENT ILLNESS: [name]  is a 66 y.o. male with a known history of COPD, CAD, high blood pressure, high cholesterol, sleep apnea, recent admission for COPD exacerbation- was discharged on 5 days Levaquin and tapering oral steroid course 2 weeks ago. He finished the medications and was feeling fine. He was able to walk with a walker when he went home. Gradually over the last week he started getting worse again and requiring more and more oxygen up to the level 5-6 L now at home. He is also not able to walk much. Concerned with this his wife called his doctor's office and the pulmonologist office. He got appointment with the pulmonologist after 2 weeks which she felt is far looking at his condition, and decided to bring him to emergency room.  In ER on x-ray he was noted to have some worsening on his pneumonia and he was on 5 L supplemental oxygen so decided to admit him to hospitalist service.  PAST MEDICAL HISTORY:   Past Medical History:  Diagnosis Date  . Asthma   . COPD (chronic obstructive pulmonary disease) (HCC)   . Heart attack (HCC)   . High blood pressure   . High cholesterol   . Scoliosis   . Seasonal allergies   . Sleep apnea     PAST SURGICAL HISTORY: Past Surgical History:  Procedure Laterality Date  . back sx     L4 L5   . CARPAL TUNNEL RELEASE Right     SOCIAL HISTORY:  Social History  Substance Use Topics  . Smoking status: Never Smoker  . Smokeless tobacco: Never Used  . Alcohol use No    FAMILY HISTORY:  Family History  Problem Relation Age of Onset  . Heart disease Father   . Asthma Father   . Cancer Father   . Heart  disease Mother   . Arthritis Mother   . Macular degeneration Brother     DRUG ALLERGIES:  Allergies  Allergen Reactions  . Metformin And Related Other (See Comments)    "shakes" per pt  . Morphine And Related Other (See Comments)    Hallucinations, sweats    REVIEW OF SYSTEMS:   CONSTITUTIONAL: No fever, fatigue or weakness.  EYES: No blurred or double vision.  EARS, NOSE, AND THROAT: No tinnitus or ear pain.  RESPIRATORY: No cough,Positive for shortness of breath, no wheezing or hemoptysis.  CARDIOVASCULAR: No chest pain, orthopnea, edema.  GASTROINTESTINAL: No nausea, vomiting, diarrhea or abdominal pain.  GENITOURINARY: No dysuria, hematuria.  ENDOCRINE: No polyuria, nocturia,  HEMATOLOGY: No anemia, easy bruising or bleeding SKIN: No rash or lesion. MUSCULOSKELETAL: No joint pain or arthritis.   NEUROLOGIC: No tingling, numbness, weakness.  PSYCHIATRY: No anxiety or depression.   MEDICATIONS AT HOME:  Prior to Admission medications   Medication Sig Start Date End Date Taking? Authorizing Provider  aspirin EC 81 MG tablet Take 81 mg by mouth daily.    [provider]  cetirizine (ZYRTEC) 10 MG tablet Take 10 mg by mouth daily.    [provider]  clopidogrel (PLAVIX) 75 MG tablet Take 75 mg by mouth daily.  [provider]  ferrous sulfate 325 (65 FE) MG EC tablet Take 325 mg by mouth daily with breakfast.    [provider]  furosemide (LASIX) 40 MG tablet Take 40 mg by mouth 2 (two) times daily.     [provider]  Liraglutide (VICTOZA) 18 MG/3ML SOPN Inject 1.8 mg into the skin daily.    [provider]  nebivolol (BYSTOLIC) 10 MG tablet Take 10 mg by mouth every morning. *Note dose*    [provider]  omeprazole (PRILOSEC) 20 MG capsule Take 20 mg by mouth daily.    [provider]  ondansetron (ZOFRAN ODT) 4 MG disintegrating tablet Take 1 tablet (4 mg total) by mouth every 8 (eight) hours as  needed for nausea or vomiting. 12/21/15   Gayla Doss, MD  OXYGEN Inhale 2 L into the lungs continuous.    [provider]  polyethylene glycol powder (GLYCOLAX/MIRALAX) powder Dissolve one heaping tablespoon in 4-8 ounces of juice or water and drink once daily. 12/21/15   Gayla Doss, MD  predniSONE (DELTASONE) 10 MG tablet Label  & dispense according to the schedule below. 5 Pills PO for 1 day then, 4 Pills PO for 1 day, 3 Pills PO for 1 day, 2 Pills PO for 1 day, 1 Pill PO for 1 days then STOP. 03/05/17   Sainani, Rolly Pancake, MD  pregabalin (LYRICA) 50 MG capsule Take 50 mg by mouth 2 (two) times daily.    [provider]  rosuvastatin (CRESTOR) 10 MG tablet Take 10 mg by mouth daily. Reported on 04/22/2016    [provider]  tamsulosin (FLOMAX) 0.4 MG CAPS capsule Take 0.4 mg by mouth daily.    [provider]  telmisartan (MICARDIS) 80 MG tablet Take 80 mg by mouth daily.    [provider]  traMADol (ULTRAM) 50 MG tablet Take 1 tablet (50 mg total) by mouth every 6 (six) hours as needed. Patient not taking: Reported on 03/02/2017 07/13/16 07/13/17  Joni Reining, PA-C  Vitamin D, Ergocalciferol, (DRISDOL) 50000 units CAPS capsule Take 50,000 Units by mouth every 7 (seven) days.    [provider]      PHYSICAL EXAMINATION:   VITAL SIGNS: Blood pressure 138/73, pulse 60, temperature 98.7 F (37.1 C), temperature source Oral, resp. rate 15, height 5\' 8"  (1.727 m), weight (!) 157.9 kg (348 lb), SpO2 95 %.  GENERAL:  66 y.o.-year-old Obese patient lying in the bed with no acute distress.  EYES: Pupils equal, round, reactive to light and accommodation. No scleral icterus. Extraocular muscles intact.  HEENT: Head atraumatic, normocephalic. Oropharynx and nasopharynx clear.  NECK:  Supple, no jugular venous distention. No thyroid enlargement, no tenderness.  LUNGS: Normal breath sounds bilaterally, no wheezing, some crepitation. No use of  accessory muscles of respiration. Requiring nasal cannula oxygen 5 L/m. CARDIOVASCULAR: S1, S2 normal. No murmurs, rubs, or gallops.  ABDOMEN: Soft, nontender, nondistended. Bowel sounds present. No organomegaly or mass.  EXTREMITIES: Bilateral pedal edema, no cyanosis, or clubbing.  NEUROLOGIC: Cranial nerves II through XII are intact. Muscle strength 4/5 in all extremities. Sensation intact. Gait not checked.  PSYCHIATRIC: The patient is alert and oriented x 3.  SKIN: No obvious rash, lesion, or ulcer.   LABORATORY PANEL:   CBC  Recent Labs Lab 03/18/17 1342  WBC 6.9  HGB 12.8*  HCT 40.1  PLT 125*  MCV 90.5  MCH 28.8  MCHC 31.9*  RDW 14.5  LYMPHSABS 1.3  MONOABS 0.5  EOSABS 0.2  BASOSABS 0.1   ------------------------------------------------------------------------------------------------------------------  Chemistries   Recent Labs Lab 03/18/17 1342  NA 140  K 4.7  CL 97*  CO2 34*  GLUCOSE 142*  BUN 36*  CREATININE 1.61*  CALCIUM 9.0   ------------------------------------------------------------------------------------------------------------------ estimated creatinine clearance is 67.4 mL/min (A) (by C-G formula based on SCr of 1.61 mg/dL (H)). ------------------------------------------------------------------------------------------------------------------ No results for input(s): TSH, T4TOTAL, T3FREE, THYROIDAB in the last 72 hours.  Invalid input(s): FREET3   Coagulation profile No results for input(s): INR, PROTIME in the last 168 hours. ------------------------------------------------------------------------------------------------------------------- No results for input(s): DDIMER in the last 72 hours. -------------------------------------------------------------------------------------------------------------------  Cardiac Enzymes  Recent Labs Lab 03/18/17 1342  TROPONINI <0.03    ------------------------------------------------------------------------------------------------------------------ Invalid input(s): POCBNP  ---------------------------------------------------------------------------------------------------------------  Urinalysis    Component Value Date/Time   COLORURINE AMBER (A) 03/01/2017 2252   APPEARANCEUR CLOUDY (A) 03/01/2017 2252   LABSPEC 1.017 03/01/2017 2252   PHURINE 6.0 03/01/2017 2252   GLUCOSEU NEGATIVE 03/01/2017 2252   HGBUR SMALL (A) 03/01/2017 2252   BILIRUBINUR NEGATIVE 03/01/2017 2252   KETONESUR NEGATIVE 03/01/2017 2252   PROTEINUR >=300 (A) 03/01/2017 2252   NITRITE POSITIVE (A) 03/01/2017 2252   LEUKOCYTESUR NEGATIVE 03/01/2017 2252     RADIOLOGY: Dg Chest 2 View  Result Date: 03/18/2017 CLINICAL DATA:  Productive cough. EXAM: CHEST  2 VIEW COMPARISON:  03/05/2017. FINDINGS: Stable mildly enlarged cardiac silhouette. Mildly increased patchy opacity at the left lung base without significant change in mild patchy and linear opacity at the right lung base. Mild diffuse peribronchial thickening and accentuation of the interstitial markings is unchanged. Aortic arch calcifications and thoracic spine degenerative changes are again demonstrated. IMPRESSION: 1. Mildly progressive probable left lower lobe pneumonia. 2. Mildly progressive probable atelectasis at the right lung base. 3. Stable mild bronchitic changes and mild cardiomegaly. Electronically Signed   By: Beckie Salts M.D.   On: 03/18/2017 14:42    EKG: Orders placed or performed during the hospital encounter of 03/18/17  . ED EKG  . ED EKG  . EKG 12-Lead  . EKG 12-Lead    IMPRESSION AND PLAN:  * Healthcare associated pneumonia    IV vancomycin and cefepime for now.   Try to get sputum culture.   Will get CT scan of the chest to rule out PE and to check extent of pneumonia.  * Acute on chronic respiratory failure with hypoxia    He was discharged with 2 L  oxygen supplementation at home 2 weeks ago.   Currently requiring 5 L oxygen, I will check CT scan pulmonary angiogram.   Also get echocardiogram as the last one is almost 1 year ago.  * COPD   Currently does not appear in exacerbation.   I will continue DuoNeb nebulizer treatments.  * History of coronary artery disease   Continue aspirin, Plavix, beta blocker, statin.  * Diabetes   Keep on insulin sliding scale coverage.  All the records are reviewed and case discussed with ED provider. Management plans discussed with the patient, family and they are in agreement.  CODE STATUS: Full code. Code Status History    Date Active Date Inactive Code Status Order ID Comments User Context   03/01/2017 10:32 PM 03/05/2017  7:07 PM Full Code [id number]  Auburn Bilberry, MD Inpatient   04/17/2016  3:47 PM 04/20/2016  7:25 PM Full Code [id number]  Marguarite Arbour, MD Inpatient     Patient's wife was present in the room during my visit.  TOTAL TIME TAKING CARE OF THIS PATIENT: 50 minutes.    Altamese Dilling M.D on 03/18/2017   Between 7am to 6pm - Pager - [phone]  After 6pm go to [email or web address] - password [email or web address]  [facility]  Office  [phone]  CC: Primary care physician; Sherrie Mustache   Note: This dictation was prepared with Dragon dictation along with smaller phrase technology. Any transcriptional errors that result from this process are unintentional.

## 2017-03-18 NOTE — Progress Notes (Signed)
Pt's HR is high. RN at bedside. Pt is SOB and states that he needs his breathing treatment. Treatment given will monitoring his HR the entire time.

## 2017-03-18 NOTE — Progress Notes (Signed)
Pharmacy Antibiotic Note  [name] is a 66 y.o. male admitted on 03/18/2017 with pneumonia.  Pharmacy has been consulted for cefepime and vancomycin dosing.  Plan: 1. Cefepime 2 gm IV Q8H 2. Vancomycin 1 gm IV x 1 followed in approximately 6 hours (stacked dosing) by vancomycin 1.25 gm IV Q12H, predicted trough 17 mcg/mL. Pharmacy will continue to follow and adjust as needed to maintain trough 15 to 20 mcg/ml.   Vd 72.9 L, Ke 0.06 hr-1, T1/2 11.5 hr  Height: 5\' 8"  (172.7 cm) Weight: (!) 348 lb (157.9 kg) IBW/kg (Calculated) : 68.4  Temp (24hrs), Avg:98.7 F (37.1 C), Min:98.7 F (37.1 C), Max:98.7 F (37.1 C)   Recent Labs Lab 03/18/17 1342  WBC 6.9  CREATININE 1.61*    Estimated Creatinine Clearance: 67.4 mL/min (A) (by C-G formula based on SCr of 1.61 mg/dL (H)).    Allergies  Allergen Reactions  . Metformin And Related Other (See Comments)    "shakes" per pt  . Morphine And Related Other (See Comments)    Hallucinations, sweats    Thank you for allowing pharmacy to be a part of this patient's care.  Carola FrostNathan A Jennelle Pinkstaff, Pharm.D., BCPS Clinical Pharmacist 03/18/2017 6:29 PM

## 2017-03-18 NOTE — Telephone Encounter (Signed)
Dr Aurelio BrashJadali's office Angie calling to see if we can see patient earlier than June  [facility] Please advise.

## 2017-03-19 ENCOUNTER — Inpatient Hospital Stay: Admit: 2017-03-19 | Payer: Medicare Other

## 2017-03-19 ENCOUNTER — Inpatient Hospital Stay (HOSPITAL_COMMUNITY)
Admit: 2017-03-19 | Discharge: 2017-03-19 | Disposition: A | Discharge status: Home / Self Care | Payer: Medicare Other | Attending: Internal Medicine | Admitting: Internal Medicine

## 2017-03-19 DIAGNOSIS — I270 Primary pulmonary hypertension: Secondary | ICD-10-CM

## 2017-03-19 DIAGNOSIS — G4733 Obstructive sleep apnea (adult) (pediatric): Secondary | ICD-10-CM

## 2017-03-19 DIAGNOSIS — J189 Pneumonia, unspecified organism: Principal | ICD-10-CM

## 2017-03-19 DIAGNOSIS — R0602 Shortness of breath: Secondary | ICD-10-CM

## 2017-03-19 DIAGNOSIS — R0902 Hypoxemia: Secondary | ICD-10-CM

## 2017-03-19 DIAGNOSIS — I4891 Unspecified atrial fibrillation: Secondary | ICD-10-CM

## 2017-03-19 LAB — ECHOCARDIOGRAM COMPLETE
HEIGHTINCHES: 68 in
Weight: 5568 oz

## 2017-03-19 LAB — GLUCOSE, CAPILLARY
GLUCOSE-CAPILLARY: 160 mg/dL — AB (ref 65–99)
GLUCOSE-CAPILLARY: 181 mg/dL — AB (ref 65–99)
GLUCOSE-CAPILLARY: 173 mg/dL — AB (ref 65–99)
Glucose-Capillary: 152 mg/dL — ABNORMAL HIGH (ref 65–99)
Glucose-Capillary: 172 mg/dL — ABNORMAL HIGH (ref 65–99)

## 2017-03-19 LAB — CBC
HCT: 40.5 % (ref 40.0–52.0)
Hemoglobin: 13.1 g/dL (ref 13.0–18.0)
MCH: 29.2 pg (ref 26.0–34.0)
MCHC: 32.3 g/dL (ref 32.0–36.0)
MCV: 90.5 fL (ref 80.0–100.0)
PLATELETS: 124 10*3/uL — AB (ref 150–440)
RBC: 4.48 MIL/uL (ref 4.40–5.90)
RDW: 14.6 % — AB (ref 11.5–14.5)
WBC: 6.6 10*3/uL (ref 3.8–10.6)

## 2017-03-19 LAB — BASIC METABOLIC PANEL
Anion gap: 8 (ref 5–15)
BUN: 34 mg/dL — AB (ref 6–20)
CALCIUM: 8.8 mg/dL — AB (ref 8.9–10.3)
CO2: 35 mmol/L — ABNORMAL HIGH (ref 22–32)
CREATININE: 1.53 mg/dL — AB (ref 0.61–1.24)
Chloride: 98 mmol/L — ABNORMAL LOW (ref 101–111)
GFR calc Af Amer: 53 mL/min — ABNORMAL LOW (ref >60)
GFR calc non Af Amer: 46 mL/min — ABNORMAL LOW (ref >60)
Glucose, Bld: 146 mg/dL — ABNORMAL HIGH (ref 65–99)
Potassium: 4.2 mmol/L (ref 3.5–5.1)
SODIUM: 141 mmol/L (ref 135–145)

## 2017-03-19 MED ORDER — SODIUM CHLORIDE 0.9 % IV BOLUS (SEPSIS)
250.0000 mL | Freq: Once | INTRAVENOUS | Status: AC
  Administered 2017-03-19: 250 mL via INTRAVENOUS

## 2017-03-19 MED ORDER — POTASSIUM CHLORIDE CRYS ER 20 MEQ PO TBCR
20.0000 meq | EXTENDED_RELEASE_TABLET | Freq: Three times a day (TID) | ORAL | Status: DC
  Administered 2017-03-19 – 2017-03-20 (×3): 20 meq via ORAL
  Filled 2017-03-19 (×3): qty 1

## 2017-03-19 MED ORDER — DILTIAZEM HCL 100 MG IV SOLR
5.0000 mg/h | INTRAVENOUS | Status: DC
  Filled 2017-03-19: qty 100

## 2017-03-19 MED ORDER — FLUTICASONE PROPIONATE 50 MCG/ACT NA SUSP
1.0000 | Freq: Two times a day (BID) | NASAL | Status: DC
  Administered 2017-03-19 – 2017-03-26 (×14): 1 via NASAL
  Filled 2017-03-19: qty 16

## 2017-03-19 MED ORDER — METHYLPREDNISOLONE SODIUM SUCC 125 MG IJ SOLR
60.0000 mg | Freq: Two times a day (BID) | INTRAMUSCULAR | Status: DC
  Administered 2017-03-19 – 2017-03-23 (×9): 60 mg via INTRAVENOUS
  Filled 2017-03-19 (×10): qty 2

## 2017-03-19 MED ORDER — METOPROLOL TARTRATE 5 MG/5ML IV SOLN
5.0000 mg | INTRAVENOUS | Status: DC | PRN
  Administered 2017-03-19: 5 mg via INTRAVENOUS
  Filled 2017-03-19: qty 5

## 2017-03-19 MED ORDER — DIGOXIN 0.25 MG/ML IJ SOLN
0.2500 mg | Freq: Once | INTRAMUSCULAR | Status: AC
  Administered 2017-03-19: 0.25 mg via INTRAVENOUS
  Filled 2017-03-19: qty 1

## 2017-03-19 MED ORDER — ORAL CARE MOUTH RINSE
15.0000 mL | Freq: Two times a day (BID) | OROMUCOSAL | Status: DC
  Administered 2017-03-19 – 2017-03-24 (×6): 15 mL via OROMUCOSAL

## 2017-03-19 MED ORDER — FUROSEMIDE 10 MG/ML IJ SOLN
5.0000 mg/h | INTRAVENOUS | Status: DC
  Administered 2017-03-19: 5 mg/h via INTRAVENOUS
  Filled 2017-03-19: qty 25

## 2017-03-19 MED ORDER — DILTIAZEM HCL 30 MG PO TABS
30.0000 mg | ORAL_TABLET | Freq: Four times a day (QID) | ORAL | Status: DC
  Filled 2017-03-19: qty 1

## 2017-03-19 MED ORDER — PERFLUTREN LIPID MICROSPHERE
1.0000 mL | INTRAVENOUS | Status: AC | PRN
  Administered 2017-03-19: 2 mL via INTRAVENOUS
  Filled 2017-03-19: qty 10

## 2017-03-19 NOTE — Plan of Care (Signed)
Problem: Pain Managment: Goal: General experience of comfort will improve Outcome: Not Progressing Patient c/o pain in right leg, tramadol given per orders, see MAR.

## 2017-03-19 NOTE — Progress Notes (Signed)
Patient HR elevated in the 150's, I spoke with Dr.Pyreddy about patient's elevated heart rate. New orders for metoprolol iv 5 mg given prn. Also cardiac monitoring was ordered. [name],Kerianne Gurr M

## 2017-03-19 NOTE — Plan of Care (Signed)
Problem: Physical Regulation: Goal: Ability to maintain clinical measurements within normal limits will improve Outcome: Progressing Patients HR remaining controlled between 70s-100. PO cardizem not given d/t low systolic BP.   Problem: Tissue Perfusion: Goal: Risk factors for ineffective tissue perfusion will decrease Outcome: Not Progressing Patient remains on HFNC at 70%.   Problem: Activity: Goal: Risk for activity intolerance will decrease Outcome: Not Progressing Patient currently unable to lift lower extremities. DOE

## 2017-03-19 NOTE — Progress Notes (Addendum)
Patient to be transferred to 2A per MD orders. Report given to [facility]. Patient to be transported with oxygen.   Loel RoVerne H. Janice Seales, RN 03/19/17 915

## 2017-03-19 NOTE — Progress Notes (Signed)
Paged Dr. Nicholos Johnsamachandran in regards to parameters for furosemide drip. Orders to continue as long as SBP >90, orders also to d/c furosemide tablets scheduled for time being.

## 2017-03-19 NOTE — Progress Notes (Signed)
PT Cancellation Note  Patient Details Name: [name] MRN: [id number] DOB: 06/06/1951   Cancelled Treatment:    Reason Eval/Treat Not Completed: Medical issues which prohibited therapy. Patient currently on IV cardizem, BP 84/50 and severe O2 demand at rest. PT will hold mobility evaluation until patient is more medically stable.   Alva GarnetPatrick Chelsae Zanella PT, DPT, CSCS    03/19/2017, 8:58 AM

## 2017-03-19 NOTE — Progress Notes (Signed)
[facility] - [place] [facility]   PATIENT NAME: [name]    MR#:  [id number]  DATE OF BIRTH:  1950-12-23  SUBJECTIVE:  CHIEF COMPLAINT:   Chief Complaint  Patient presents with  . Cough   Cough. SOB No CP  Tachycardic into 150s. Atrial tachycardia vs Afib  REVIEW OF SYSTEMS:    Review of Systems  Constitutional: Positive for malaise/fatigue. Negative for chills and fever.  HENT: Negative for sore throat.   Eyes: Negative for blurred vision, double vision and pain.  Respiratory: Positive for cough and shortness of breath. Negative for hemoptysis and wheezing.   Cardiovascular: Positive for orthopnea. Negative for chest pain, palpitations and leg swelling.  Gastrointestinal: Negative for abdominal pain, constipation, diarrhea, heartburn, nausea and vomiting.  Genitourinary: Negative for dysuria and hematuria.  Musculoskeletal: Negative for back pain and joint pain.  Skin: Negative for rash.  Neurological: Positive for weakness. Negative for sensory change, speech change, focal weakness and headaches.  Endo/Heme/Allergies: Does not bruise/bleed easily.  Psychiatric/Behavioral: Negative for depression. The patient is not nervous/anxious.     DRUG ALLERGIES:   Allergies  Allergen Reactions  . Metformin And Related Other (See Comments)    "shakes" per pt  . Morphine And Related Other (See Comments)    Hallucinations, sweats    VITALS:  Blood pressure (!) 95/58, pulse (!) 138, temperature 98.4 F (36.9 C), temperature source Oral, resp. rate 18, height 5\' 8"  (1.727 m), weight (!) 157.9 kg (348 lb), SpO2 90 %.  PHYSICAL EXAMINATION:   Physical Exam  GENERAL:  66 y.o.-year-old patient sitting in a chair. Morbidly obese. Conversational dyspnea.  EYES: Pupils equal, round, reactive to light and accommodation. No scleral icterus. Extraocular muscles intact.  HEENT: Head atraumatic, normocephalic. Oropharynx and nasopharynx clear.  NECK:  Supple,  no jugular venous distention. No thyroid enlargement, no tenderness.  LUNGS: Normal breath sounds bilaterally, no wheezing, rales, rhonchi. No use of accessory muscles of respiration.  CARDIOVASCULAR: S1, S2. Irregular. Tachycardic ABDOMEN: Soft, nontender, nondistended. Bowel sounds present. No organomegaly or mass.  EXTREMITIES: No cyanosis, clubbing. B/L LE edema NEUROLOGIC: Cranial nerves II through XII are intact. No focal Motor or sensory deficits b/l.   PSYCHIATRIC: The patient is alert and oriented x 3.  SKIN: No obvious rash, lesion, or ulcer.   LABORATORY PANEL:   CBC  Recent Labs Lab 03/19/17 0330  WBC 6.6  HGB 13.1  HCT 40.5  PLT 124*   ------------------------------------------------------------------------------------------------------------------ Chemistries   Recent Labs Lab 03/19/17 0330  NA 141  K 4.2  CL 98*  CO2 35*  GLUCOSE 146*  BUN 34*  CREATININE 1.53*  CALCIUM 8.8*   ------------------------------------------------------------------------------------------------------------------  Cardiac Enzymes  Recent Labs Lab 03/18/17 1342  TROPONINI <0.03   ------------------------------------------------------------------------------------------------------------------  RADIOLOGY:  Dg Chest 2 View  Result Date: 03/18/2017 CLINICAL DATA:  Productive cough. EXAM: CHEST  2 VIEW COMPARISON:  03/05/2017. FINDINGS: Stable mildly enlarged cardiac silhouette. Mildly increased patchy opacity at the left lung base without significant change in mild patchy and linear opacity at the right lung base. Mild diffuse peribronchial thickening and accentuation of the interstitial markings is unchanged. Aortic arch calcifications and thoracic spine degenerative changes are again demonstrated. IMPRESSION: 1. Mildly progressive probable left lower lobe pneumonia. 2. Mildly progressive probable atelectasis at the right lung base. 3. Stable mild bronchitic changes and mild  cardiomegaly. Electronically Signed   By: Beckie SaltsSteven  Reid M.D.   On: 03/18/2017 14:42     ASSESSMENT AND PLAN:   *  Acute on chronic resp failure due to COPD exacerbation and Bibasilar pneumonia On vancomycin and cefepime. Stop vancomycin IV steroids . First dose now. Nebs Scheduled Discussed with Dr. Nicholos Johns. Critically ill. On 7.5 L O2. Change to HFNC. Does not want to go to Step down/ICU inspite of counselling  * Atrial tachycardia or Afib Check EKG. No response to IV lopressor Cardizem Will consult cardiology  * HTN Cardizem  * CKD3 Monitor Stable  * DVT prophylaxis Lovenox  All the records are reviewed and case discussed with Care Management/Social Worker Management plans discussed with the patient, family and they are in agreement.  CODE STATUS: FULL CODE  DVT Prophylaxis: SCDs  TOTAL TIME TAKING CARE OF THIS PATIENT: 35 minutes.   POSSIBLE D/C IN 3-4 DAYS, DEPENDING ON CLINICAL CONDITION.  Milagros Loll R M.D on 03/19/2017 at 8:10 AM  Between 7am to 6pm - Pager - [phone]  After 6pm go to [email or web address] - password [email or web address] [facility]  [facility]  Office  [phone]  CC: Primary care physician; Sherrie Mustache  Note: This dictation was prepared with Dragon dictation along with smaller phrase technology. Any transcriptional errors that result from this process are unintentional.

## 2017-03-19 NOTE — Consult Note (Signed)
[facility] Consultation      Assessment and Plan:  Acute pneumonia vs. Atelectasis.  --Continue empiric abx.  --Check procalcitonin  Pulmonary edema.  --Lasix drip for 24 hours.   Pulmonary hypertension.  --Likely secondary to cardiac and pulmonary disease, will await for result of echo.   Obesity with likely obesity hypoventilation.  --Continue bipap at night.   Acute respiratory failure --multifactorial secondary to above.   Date: 03/19/2017  MRN# [id number] [name] R Jezewski 1951-11-01  Referring Physician: Dr. Elpidio AnisSudini for dyspnea.   [name] R Hammes is a 66 y.o. old male seen in consultation for chief complaint of:    Chief Complaint  Patient presents with  . Cough    HPI:   The patient is a 66 yo male, he has a hisitory of COPD, OSA, CAD, he was recently discharged 2 weeks ago for COPD exacerbation, with a course of levaquin. He last saw [facility] a little over 3 years ago, at that time it was noted that he had restriction, OSA.Marland Kitchen.  He is now admitted to the hospital with progressive dyspnea.  Echo 04/19/16; EF=60% An echo today is pending; per report this shows severely elevated right heart pressures. He was seen by cardiology and noted to be in new afib, which was new at his last admission. He was started on diltiazem PO, digoxin, eliquis.  Since his admission yesterday he has required higher amounts of oxygen. He was initially on 6L [place], this was changed to hi-flow @40 % this am, now at 70%.  Personally reviewed CXR images, which shows bibasilar atelectasis vs. pneumonia. Labs show mild AKI; procalcitonin from recent admit on 4/26 is elevated at 5.65.   Pt was seen by our service during his hospital admission 2 weeks ago. At that time it was noted that he had pneumonia and volume overload, he was high flow on that time as well--he was treated with lasix drip and abx and improved.     PMHX:   Past Medical History:  Diagnosis Date  . Asthma   .  COPD (chronic obstructive pulmonary disease) (HCC)   . Heart attack (HCC)   . High blood pressure   . High cholesterol   . Scoliosis   . Seasonal allergies   . Sleep apnea    Surgical Hx:  Past Surgical History:  Procedure Laterality Date  . back sx     L4 L5   . CARPAL TUNNEL RELEASE Right    Family Hx:  Family History  Problem Relation Age of Onset  . Heart disease Father   . Asthma Father   . Cancer Father   . Heart disease Mother   . Arthritis Mother   . Macular degeneration Brother    Social Hx:   Social History  Substance Use Topics  . Smoking status: Never Smoker  . Smokeless tobacco: Never Used  . Alcohol use No   Medication:   Reviewed.    Allergies:  Metformin and related and Morphine and related  Review of Systems: Gen:  Denies  fever, sweats, chills HEENT: Denies blurred vision, double vision. bleeds, sore throat Cvc:  No dizziness, chest pain. Resp:   Denies cough or sputum production,  Gi: Denies swallowing difficulty, stomach pain. Gu:  Denies bladder incontinence, burning urine Ext:   No Joint pain, stiffness. Skin: No skin rash,  hives  Endoc:  No polyuria, polydipsia. Psych: No depression, insomnia. Other:  All other systems were reviewed with the patient and were negative  other that what is mentioned in the HPI.   Physical Examination:   VS: BP 92/68 (BP Location: Left Arm)   Pulse (!) 138   Temp 98.4 F (36.9 C) (Oral)   Resp 18   Ht 5\' 8"  (1.727 m)   Wt (!) 348 lb (157.9 kg)   SpO2 94%   BMI 52.91 kg/m   General Appearance: No distress; pbesity Neuro:without focal findings,  speech normal,  HEENT: PERRLA, EOM intact.   Pulmonary: normal breath sounds, No wheezing. Decreased air entry bilaterally.  CardiovascularNormal S1,S2.  No m/r/g.   Abdomen: Benign, Soft, non-tender. Renal:  No costovertebral tenderness  GU:  No performed at this time. Endoc: No evident thyromegaly, no signs of acromegaly. Skin:   warm, no rashes, no  ecchymosis  Extremities: normal, no cyanosis, clubbing.  Other findings:    LABORATORY PANEL:   CBC  Recent Labs Lab 03/19/17 0330  WBC 6.6  HGB 13.1  HCT 40.5  PLT 124*   ------------------------------------------------------------------------------------------------------------------  Chemistries   Recent Labs Lab 03/19/17 0330  NA 141  K 4.2  CL 98*  CO2 35*  GLUCOSE 146*  BUN 34*  CREATININE 1.53*  CALCIUM 8.8*   ------------------------------------------------------------------------------------------------------------------  Cardiac Enzymes  Recent Labs Lab 03/18/17 1342  TROPONINI <0.03   ------------------------------------------------------------  RADIOLOGY:  Dg Chest 2 View  Result Date: 03/18/2017 CLINICAL DATA:  Productive cough. EXAM: CHEST  2 VIEW COMPARISON:  03/05/2017. FINDINGS: Stable mildly enlarged cardiac silhouette. Mildly increased patchy opacity at the left lung base without significant change in mild patchy and linear opacity at the right lung base. Mild diffuse peribronchial thickening and accentuation of the interstitial markings is unchanged. Aortic arch calcifications and thoracic spine degenerative changes are again demonstrated. IMPRESSION: 1. Mildly progressive probable left lower lobe pneumonia. 2. Mildly progressive probable atelectasis at the right lung base. 3. Stable mild bronchitic changes and mild cardiomegaly. Electronically Signed   By: Beckie Salts M.D.   On: 03/18/2017 14:42       Thank  you for the consultation and for allowing [facility] to assist in the care of your patient. Our recommendations are noted above.  Please contact us if we can be of further service.   Wells Guiles, MD.  Board Certified in Internal Medicine, Pulmonary Medicine, Critical Care Medicine, and Sleep Medicine.  [facility] Office Number: [phone]  Santiago Glad, M.D.  Billy Fischer, M.D  03/19/2017

## 2017-03-19 NOTE — Consult Note (Signed)
Cardiology Consultation Note  Patient ID: [name], MRN: [id number], DOB/AGE: [birth year] 66 y.o. Admit date: 03/18/2017   Date of Consult: 03/19/2017 Primary Physician: Sherrie Mustache Primary Cardiologist: New to [facility]  Chief Complaint: Shortness of breath, respiratory distress Reason for Consult: Atrial fibrillation with RVR, shortness of breath Physician requesting consult: Dr. Elpidio Anis   HPI: 66 y.o. male with h/o morbid obesity, obstructive sleep apnea, hypertension, chronic hypercarbic respiratory failure, notes indicating COPD with no prior smoking history, chronic lower extremity edema who presents with worsening shortness of breath, noted to be in atrial fibrillation  Recent hospital admission 03/01/2017 for similar symptoms were treated for pneumonia, COPD exacerbation. During that hospitalization he converted to atrial fibrillation  He reports he was doing well at home after completing his antibiotics,  Yesterday fan was blowing in his face, felt it caused sinus congestion, difficulty breathing, presented to the hospital  He was on 2C when he had worsening hypoxia, per respiratory saturations into the low 80s He was transferred to 2A, on high flow nasal cannula oxygen  He has chronic leg edema, better now than it was several weeks ago per the patient His been taking Lasix 40 mg every morning.  He reports severe cough last night, white sputum Unclear why he is on aspirin and Plavix, this was started by primary care  Preliminary echocardiogram showing low normal ejection fraction 50-55%, moderate to severely elevated right heart pressures  CT scan chest from 2017 reviewed showing minimal coronary calcifications    Past Medical History:  Diagnosis Date  . Asthma   . COPD (chronic obstructive pulmonary disease) (HCC)   . Heart attack (HCC)   . High blood pressure   . High cholesterol   . Scoliosis   . Seasonal allergies   . Sleep apnea       Most Recent Cardiac  Studies: Echocardiogram ejection fraction 50-55%, moderate to severely elevated right heart pressures , challenging image quality is patient was sitting in a chair    Surgical History:  Past Surgical History:  Procedure Laterality Date  . back sx     L4 L5   . CARPAL TUNNEL RELEASE Right      Home Meds: Prior to Admission medications   Medication Sig Start Date End Date Taking? Authorizing Provider  aspirin EC 81 MG tablet Take 81 mg by mouth daily.   Yes [provider]  cetirizine (ZYRTEC) 10 MG tablet Take 10 mg by mouth daily.   Yes [provider]  clopidogrel (PLAVIX) 75 MG tablet Take 75 mg by mouth daily.   Yes [provider]  ferrous sulfate 325 (65 FE) MG EC tablet Take 325 mg by mouth daily with breakfast.   Yes [provider]  furosemide (LASIX) 40 MG tablet Take 40 mg by mouth 2 (two) times daily.    Yes [provider]  glipiZIDE (GLUCOTROL) 10 MG tablet Take 10 mg by mouth 2 (two) times daily before a meal.   Yes [provider]  Nebivolol HCl 20 MG TABS Take 10-40 mg by mouth 2 (two) times daily. 40mg  in the morning and 10mg  at bedtime   Yes [provider]  omeprazole (PRILOSEC) 20 MG capsule Take 20 mg by mouth daily.   Yes [provider]  OXYGEN Inhale 2 L into the lungs continuous.   Yes [provider]  polyethylene glycol powder (GLYCOLAX/MIRALAX) powder Dissolve one heaping tablespoon in 4-8 ounces of juice or water and drink once  daily. Patient taking differently: Take 17 g by mouth daily as needed for mild constipation.  12/21/15  Yes Gayla Doss, MD  pregabalin (LYRICA) 50 MG capsule Take 50 mg by mouth 2 (two) times daily.   Yes [provider]  rosuvastatin (CRESTOR) 10 MG tablet Take 10 mg by mouth daily. Reported on 04/22/2016   Yes [provider]  tamsulosin (FLOMAX) 0.4 MG CAPS capsule Take 0.4 mg by mouth daily.   Yes [provider]   telmisartan (MICARDIS) 80 MG tablet Take 80 mg by mouth daily.   Yes [provider]  Vitamin D, Ergocalciferol, (DRISDOL) 50000 units CAPS capsule Take 50,000 Units by mouth every 7 (seven) days. Patient takes on Monday   Yes [provider]    Inpatient Medications:  . diltiazem  30 mg Oral Q6H  . ferrous sulfate  325 mg Oral Q breakfast  . fluticasone  1 spray Each Nare BID  . furosemide  40 mg Oral BID  . heparin  5,000 Units Subcutaneous Q8H  . insulin aspart  0-9 Units Subcutaneous TID WC  . ipratropium-albuterol  3 mL Nebulization Q4H  . loratadine  10 mg Oral Daily  . mouth rinse  15 mL Mouth Rinse BID  . methylPREDNISolone (SOLU-MEDROL) injection  60 mg Intravenous Q12H  . nebivolol  10 mg Oral BH-q7a  . pantoprazole  40 mg Oral Daily  . pregabalin  50 mg Oral BID  . rosuvastatin  10 mg Oral Daily  . tamsulosin  0.4 mg Oral Daily   . ceFEPime (MAXIPIME) IV Stopped (03/19/17 1144)    Allergies:  Allergies  Allergen Reactions  . Metformin And Related Other (See Comments)    "shakes" per pt  . Morphine And Related Other (See Comments)    Hallucinations, sweats    Social History   Social History  . Marital status: Married    Spouse name: N/A  . Number of children: N/A  . Years of education: N/A   Occupational History  . Not on file.   Social History Main Topics  . Smoking status: Never Smoker  . Smokeless tobacco: Never Used  . Alcohol use No  . Drug use: No  . Sexual activity: Not on file   Other Topics Concern  . Not on file   Social History Narrative  . No narrative on file     Family History  Problem Relation Age of Onset  . Heart disease Father   . Asthma Father   . Cancer Father   . Heart disease Mother   . Arthritis Mother   . Macular degeneration Brother      Review of Systems: Review of Systems  Constitutional: Negative.   Respiratory: Positive for cough and shortness of breath.   Cardiovascular: Positive for  leg swelling.  Gastrointestinal: Negative.   Musculoskeletal: Negative.   Neurological: Negative.   Psychiatric/Behavioral: Negative.   All other systems reviewed and are negative.  All other systems were reviewed and negative.   Labs: CBC  Recent Labs  03/18/17 1342 03/19/17 0330  WBC 6.9 6.6  NEUTROABS 4.8  --   HGB 12.8* 13.1  HCT 40.1 40.5  MCV 90.5 90.5  PLT 125* 124*   Basic Metabolic Panel  Recent Labs  03/18/17 1342 03/19/17 0330  NA 140 141  K 4.7 4.2  CL 97* 98*  CO2 34* 35*  GLUCOSE 142* 146*  BUN 36* 34*  CREATININE 1.61* 1.53*  CALCIUM 9.0 8.8*  Liver Function Tests No results for input(s): AST, ALT, ALKPHOS, BILITOT, PROT, ALBUMIN in the last 72 hours. No results for input(s): LIPASE, AMYLASE in the last 72 hours. Cardiac Enzymes  Recent Labs  03/18/17 1342  TROPONINI <0.03   BNP Invalid input(s): POCBNP D-Dimer No results for input(s): DDIMER in the last 72 hours. Hemoglobin A1C No results for input(s): HGBA1C in the last 72 hours. Fasting Lipid Panel No results for input(s): CHOL, HDL, LDLCALC, TRIG, CHOLHDL, LDLDIRECT in the last 72 hours. Thyroid Function Tests No results for input(s): TSH, T4TOTAL, T3FREE, THYROIDAB in the last 72 hours.  Invalid input(s): FREET3  Radiology/Studies:  Dg Chest 2 View  Result Date: 03/18/2017  IMPRESSION: 1. Mildly progressive probable left lower lobe pneumonia. 2. Mildly progressive probable atelectasis at the right lung base. 3. Stable mild bronchitic changes and mild cardiomegaly. Electronically Signed   By: Beckie SaltsSteven  Reid M.D.   On: 03/18/2017 14:42   [place] Venous Img Lower Bilateral  Result Date: 03/02/2017  IMPRESSION: 1. Posterior tibial and peroneal veins not well visualized in the calf. Thrombus within the calf veins cannot be completely excluded. 2. No other evidence of DVT. Electronically Signed   By: Maisie Fushomas  Register   On: 03/02/2017 10:16   Dg Chest Port 1 View  Result Date: 03/05/2017   IMPRESSION: Bibasilar atelectasis with probable small focus of pneumonia left base. Lungs elsewhere clear. There is aortic atherosclerosis. Electronically Signed   By: Bretta BangWilliam  Woodruff III M.D.   On: 03/05/2017 07:20   Dg Chest Port 1 View  Result Date: 03/01/2017  IMPRESSION: 1. Low lung volumes with bibasilar areas of atelectasis and/or airspace consolidation. Probable small bilateral pleural effusions. 2. Aortic atherosclerosis. Electronically Signed   By: Trudie Reedaniel  Entrikin M.D.   On: 03/01/2017 20:05    EKG: Personally reviewed by myself Atrial fibrillation with ventricular rate 116 bpm no significant ST-T wave changes   EKG lab work, chest x-ray, echocardiogram reviewed independently by myself  Weights: Filed Weights   03/18/17 1332  Weight: (!) 348 lb (157.9 kg)     Physical Exam:Telemetry showing atrial fibrillation rate 100-110 bpm Blood pressure 92/68, pulse (!) 138, temperature 98.4 F (36.9 C), temperature source Oral, resp. rate 18, height 5\' 8"  (1.727 m), weight (!) 348 lb (157.9 kg), SpO2 94 %. Body mass index is 52.91 kg/m. GEN: Well nourished, well developed, in no acute distress, morbidly obese on high flow nasal cannula oxygen.  HEENT: Grossly normal.  Neck: Supple, no JVD, carotid bruits, or masses. Cardiac:Irregular rate and rhythm, 2+ SEM RSB,  , rubs, or gallops. No clubbing, cyanosis, +  trace pitting lower extremity edema to below the knees Radials/DP/PT 2+ and equal bilaterally.  Respiratory:  Respirations regular and unlabored, clear to auscultation bilaterally. GI: Soft, nontender, nondistended, BS + x 4. MS: no deformity or atrophy. Skin: warm and dry, no rash. Neuro:  Strength and sensation are intact. Psych: AAOx3.  Normal affect.    Assessment and Plan:   1. Acute on chronic respiratory distress Obesity hypoventilation, sleep apnea contributing New atrial fibrillation presenting on last hospital admission (he converted to atrial fibrillation while  in the hospital) Moderate to severely elevated right heart pressures on echocardiogram consistent with diastolic CHF now with worsening renal failure Weight is markedly elevated compared to previous hospital admission, notes indicating he was 320 pounds on April 27 now up to 348 pounds ----Would consider changing Lasix to 40 IV twice a day ----Respiratory support with high flow nasal cannula  oxygen ----Digoxin and diltiazem added for rate control for atrial fibrillation He will likely need torsemide 40 mg twice a day at discharge  2) atrial fibrillation, with RVR Converted from normal sinus rhythm to atrial fibrillation on last hospital admission 03/05/2017 -Recommend we discontinue aspirin and Plavix (unclear why this was started by primary care) Would start eliquis 5 mg po BID After one month anticoagulation, could consider cardioversion For rate control continue beta blocker (bystolic), add diltiazem 30 mg every 6 withhold parameters (we'll try to avoid long-term calcium channel blockers given lower extremity edema) -We'll add low-dose digoxin one time with close monitoring of renal function  3) diabetes type 2 No recent hemoglobin A1c available, order placed On insulin  4) acute renal failure Dramatic worsening compared to 03/05/2017 Underlying diabetes, unable to exclude cardiorenal We'll need to monitor carefully with diuresis   Total encounter time more than 110 minutes  Greater than 50% was spent in counseling and coordination of care with the patient   Signed, Dossie Arbour, MD, Ph.D [facility] 03/19/2017

## 2017-03-20 DIAGNOSIS — I481 Persistent atrial fibrillation: Secondary | ICD-10-CM

## 2017-03-20 DIAGNOSIS — I4819 Other persistent atrial fibrillation: Secondary | ICD-10-CM

## 2017-03-20 DIAGNOSIS — N170 Acute kidney failure with tubular necrosis: Secondary | ICD-10-CM

## 2017-03-20 DIAGNOSIS — N183 Chronic kidney disease, stage 3 unspecified: Secondary | ICD-10-CM

## 2017-03-20 DIAGNOSIS — J449 Chronic obstructive pulmonary disease, unspecified: Secondary | ICD-10-CM

## 2017-03-20 DIAGNOSIS — R0902 Hypoxemia: Secondary | ICD-10-CM

## 2017-03-20 LAB — BASIC METABOLIC PANEL
Anion gap: 6 (ref 5–15)
BUN: 43 mg/dL — AB (ref 6–20)
CALCIUM: 8.6 mg/dL — AB (ref 8.9–10.3)
CO2: 32 mmol/L (ref 22–32)
Chloride: 97 mmol/L — ABNORMAL LOW (ref 101–111)
Creatinine, Ser: 2.17 mg/dL — ABNORMAL HIGH (ref 0.61–1.24)
GFR calc Af Amer: 35 mL/min — ABNORMAL LOW (ref >60)
GFR, EST NON AFRICAN AMERICAN: 30 mL/min — AB (ref >60)
GLUCOSE: 250 mg/dL — AB (ref 65–99)
Potassium: 5.9 mmol/L — ABNORMAL HIGH (ref 3.5–5.1)
Sodium: 135 mmol/L (ref 135–145)

## 2017-03-20 LAB — GLUCOSE, CAPILLARY
GLUCOSE-CAPILLARY: 165 mg/dL — AB (ref 65–99)
Glucose-Capillary: 229 mg/dL — ABNORMAL HIGH (ref 65–99)
Glucose-Capillary: 173 mg/dL — ABNORMAL HIGH (ref 65–99)
Glucose-Capillary: 163 mg/dL — ABNORMAL HIGH (ref 65–99)

## 2017-03-20 MED ORDER — DILTIAZEM HCL 30 MG PO TABS
30.0000 mg | ORAL_TABLET | Freq: Three times a day (TID) | ORAL | Status: DC
  Administered 2017-03-20 – 2017-03-21 (×3): 30 mg via ORAL
  Filled 2017-03-20 (×3): qty 1

## 2017-03-20 MED ORDER — DEXTROSE 5 % IV SOLN
2.0000 g | Freq: Two times a day (BID) | INTRAVENOUS | Status: DC
  Administered 2017-03-21: 2 g via INTRAVENOUS
  Filled 2017-03-20 (×2): qty 2

## 2017-03-20 MED ORDER — SODIUM POLYSTYRENE SULFONATE 15 GM/60ML PO SUSP
30.0000 g | Freq: Once | ORAL | Status: AC
  Administered 2017-03-20: 30 g via ORAL
  Filled 2017-03-20: qty 120

## 2017-03-20 NOTE — Progress Notes (Signed)
Patient remains on HFNC no distress.  We discussed bipap therapy earlier. He states he is up all night. He sleeps during the day so therapy would be more useful for him daytime when he is sleeping. Will continue to monitor.

## 2017-03-20 NOTE — Progress Notes (Signed)
[facility], [place] 03/20/17  Subjective:   Patient known to our practice from outpatient Patient admitted for cough, pneumonia Renal consult for ARF Baseline Creatinine 1.02 on 03/04/17  Objective:  Vital signs in last 24 hours:  Temp:  [98.1 F (36.7 C)-98.2 F (36.8 C)] 98.1 F (36.7 C) (05/13 1219) Pulse Rate:  [50-78] 50 (05/13 1219) Resp:  [16-21] 21 (05/13 1219) BP: (93-109)/(52-65) 109/52 (05/13 1219) SpO2:  [91 %-98 %] 91 % (05/13 1219) FiO2 (%):  [70 %] 70 % (05/13 1117)  Weight change:  Filed Weights   03/18/17 1332  Weight: (!) 157.9 kg (348 lb)    Intake/Output:    Intake/Output Summary (Last 24 hours) at 03/20/17 1221 Last data filed at 03/20/17 0348  Gross per 24 hour  Intake            537.5 ml  Output              200 ml  Net            337.5 ml     Physical Exam: General: NAD, sitting up in chair, morbid obese  HEENT anicteric  Neck supple  Pulm/lungs Normal effort, limited exam, crackles b/l  CVS/Heart Tachycardic , irregular   Abdomen:  Obese, non tender  Extremities: + dependent edema  Neurologic: Alert, oriiented  Skin: ;congestive changes over legs b/l          Basic Metabolic Panel:   Recent Labs Lab 03/18/17 1342 03/19/17 0330 03/20/17 0422  NA 140 141 135  K 4.7 4.2 5.9*  CL 97* 98* 97*  CO2 34* 35* 32  GLUCOSE 142* 146* 250*  BUN 36* 34* 43*  CREATININE 1.61* 1.53* 2.17*  CALCIUM 9.0 8.8* 8.6*     CBC:  Recent Labs Lab 03/18/17 1342 03/19/17 0330  WBC 6.9 6.6  NEUTROABS 4.8  --   HGB 12.8* 13.1  HCT 40.1 40.5  MCV 90.5 90.5  PLT 125* 124*     No results found for: HEPBSAG, HEPBSAB, HEPBIGM    Microbiology:  Recent Results (from the past 240 hour(s))  Blood culture (routine x 2)     Status: None (Preliminary result)   Collection Time: 03/18/17  6:13 PM  Result Value Ref Range Status   Specimen Description BLOOD  R AC  Final   Special Requests BOTTLES DRAWN AEROBIC  AND ANAEROBIC BCAV  Final   Culture NO GROWTH 2 DAYS  Final   Report Status PENDING  Incomplete  Blood culture (routine x 2)     Status: None (Preliminary result)   Collection Time: 03/18/17  6:13 PM  Result Value Ref Range Status   Specimen Description BLOOD  L FOREARM  Final   Special Requests BOTTLES DRAWN AEROBIC AND ANAEROBIC BCHV  Final   Culture NO GROWTH 2 DAYS  Final   Report Status PENDING  Incomplete    Coagulation Studies: No results for input(s): LABPROT, INR in the last 72 hours.  Urinalysis: No results for input(s): COLORURINE, LABSPEC, PHURINE, GLUCOSEU, HGBUR, BILIRUBINUR, KETONESUR, PROTEINUR, UROBILINOGEN, NITRITE, LEUKOCYTESUR in the last 72 hours.  Invalid input(s): APPERANCEUR    Imaging: Dg Chest 2 View  Result Date: 03/18/2017 CLINICAL DATA:  Productive cough. EXAM: CHEST  2 VIEW COMPARISON:  03/05/2017. FINDINGS: Stable mildly enlarged cardiac silhouette. Mildly increased patchy opacity at the left lung base without significant change in mild patchy and linear opacity at the right lung base. Mild diffuse peribronchial thickening and accentuation of  the interstitial markings is unchanged. Aortic arch calcifications and thoracic spine degenerative changes are again demonstrated. IMPRESSION: 1. Mildly progressive probable left lower lobe pneumonia. 2. Mildly progressive probable atelectasis at the right lung base. 3. Stable mild bronchitic changes and mild cardiomegaly. Electronically Signed   By: Beckie SaltsSteven  Reid M.D.   On: 03/18/2017 14:42     Medications:   . ceFEPime (MAXIPIME) IV Stopped (03/20/17 1111)   . diltiazem  30 mg Oral Q8H  . ferrous sulfate  325 mg Oral Q breakfast  . fluticasone  1 spray Each Nare BID  . heparin  5,000 Units Subcutaneous Q8H  . insulin aspart  0-9 Units Subcutaneous TID WC  . ipratropium-albuterol  3 mL Nebulization Q4H  . loratadine  10 mg Oral Daily  . mouth rinse  15 mL Mouth Rinse BID  . methylPREDNISolone (SOLU-MEDROL)  injection  60 mg Intravenous Q12H  . nebivolol  10 mg Oral BH-q7a  . pantoprazole  40 mg Oral Daily  . pregabalin  50 mg Oral BID  . rosuvastatin  10 mg Oral Daily  . tamsulosin  0.4 mg Oral Daily   docusate sodium, guaiFENesin-dextromethorphan, metoprolol, ondansetron, traMADol  Assessment/ Plan:  66 y.o. male with medical problems of morbid obesity, chronic respiratory failure, chronic congestive heart failure, sleep apnea, type 2 diabetes for over 10 years, history of MI, history of kidney stone February 2017 admitted for pneumonia  1.  ARF - likely ATN from hypotension and pneumonia Baseline Cr 1.02 from 03/04/2017 S Creatinine has worsened to 2.17 today  2. LE edema - Possibly combination of lymphedema and 3rd spacing consider holding Lasix drip until BP improves  3. Hyperkalemia - Low K diet - SPS given today  4. Pneumonia Abx as per primary team    LOS: 2 [facility],Noeli Lavery 5/13/201812:21 PM  [facility] [place], [place] [phone]

## 2017-03-20 NOTE — Progress Notes (Signed)
[facility] [place] [facility]   PATIENT NAME: [name]    MR#:  [id number]  DATE OF BIRTH:  November 17, 1950  SUBJECTIVE:  CHIEF COMPLAINT:   Chief Complaint  Patient presents with  . Cough   Still has shortness of breath and cough. On 70% oxygen with high flow nasal cannula. Heart rate improved.  REVIEW OF SYSTEMS:    Review of Systems  Constitutional: Positive for malaise/fatigue. Negative for chills and fever.  HENT: Negative for sore throat.   Eyes: Negative for blurred vision, double vision and pain.  Respiratory: Positive for cough and shortness of breath. Negative for hemoptysis and wheezing.   Cardiovascular: Positive for orthopnea. Negative for chest pain, palpitations and leg swelling.  Gastrointestinal: Negative for abdominal pain, constipation, diarrhea, heartburn, nausea and vomiting.  Genitourinary: Negative for dysuria and hematuria.  Musculoskeletal: Negative for back pain and joint pain.  Skin: Negative for rash.  Neurological: Positive for weakness. Negative for sensory change, speech change, focal weakness and headaches.  Endo/Heme/Allergies: Does not bruise/bleed easily.  Psychiatric/Behavioral: Negative for depression. The patient is not nervous/anxious.     DRUG ALLERGIES:   Allergies  Allergen Reactions  . Metformin And Related Other (See Comments)    "shakes" per pt  . Morphine And Related Other (See Comments)    Hallucinations, sweats    VITALS:  Blood pressure (!) 93/59, pulse 78, temperature 98.2 F (36.8 C), temperature source Oral, resp. rate 16, height 5\' 8"  (1.727 m), weight (!) 157.9 kg (348 lb), SpO2 92 %.  PHYSICAL EXAMINATION:   Physical Exam  GENERAL:  66 y.o.-year-old patient sitting in a chair. Morbidly obese. Conversational dyspnea.  EYES: Pupils equal, round, reactive to light and accommodation. No scleral icterus. Extraocular muscles intact.  HEENT: Head atraumatic, normocephalic. Oropharynx and  nasopharynx clear.  NECK:  Supple, no jugular venous distention. No thyroid enlargement, no tenderness.  LUNGS: Normal breath sounds bilaterally, no wheezing, rales, rhonchi. No use of accessory muscles of respiration.  CARDIOVASCULAR: S1, S2. Irregular. Tachycardic ABDOMEN: Soft, nontender, nondistended. Bowel sounds present. No organomegaly or mass.  EXTREMITIES: No cyanosis, clubbing. B/L LE edema NEUROLOGIC: Cranial nerves II through XII are intact. No focal Motor or sensory deficits b/l.   PSYCHIATRIC: The patient is alert and oriented x 3.  SKIN: No obvious rash, lesion, or ulcer.   LABORATORY PANEL:   CBC  Recent Labs Lab 03/19/17 0330  WBC 6.6  HGB 13.1  HCT 40.5  PLT 124*   ------------------------------------------------------------------------------------------------------------------ Chemistries   Recent Labs Lab 03/20/17 0422  NA 135  K 5.9*  CL 97*  CO2 32  GLUCOSE 250*  BUN 43*  CREATININE 2.17*  CALCIUM 8.6*   ------------------------------------------------------------------------------------------------------------------  Cardiac Enzymes  Recent Labs Lab 03/18/17 1342  TROPONINI <0.03   ------------------------------------------------------------------------------------------------------------------  RADIOLOGY:  Dg Chest 2 View  Result Date: 03/18/2017 CLINICAL DATA:  Productive cough. EXAM: CHEST  2 VIEW COMPARISON:  03/05/2017. FINDINGS: Stable mildly enlarged cardiac silhouette. Mildly increased patchy opacity at the left lung base without significant change in mild patchy and linear opacity at the right lung base. Mild diffuse peribronchial thickening and accentuation of the interstitial markings is unchanged. Aortic arch calcifications and thoracic spine degenerative changes are again demonstrated. IMPRESSION: 1. Mildly progressive probable left lower lobe pneumonia. 2. Mildly progressive probable atelectasis at the right lung base. 3. Stable  mild bronchitic changes and mild cardiomegaly. Electronically Signed   By: Beckie Salts M.D.   On: 03/18/2017 14:42  ASSESSMENT AND PLAN:   * Acute on chronic resp failure due to COPD exacerbation and Bibasilar pneumonia On  cefepime. IV steroids .  Nebs Scheduled Critically ill. High flow nasal cannula. Patient does not want to be transferred to ICU. Appreciate pulmonary input. High risk of deterioration and death. Discussed with patient regarding critical illness.  * Atrial fibrillation Cardizem Improved HR Received 1 dose digoxin yesterday Appreciate cardiology input  * AKi over CKD3 likely due to lasix Consult nephrology. Patient sees Dr. Thedore MinsSingh as OP  * Hyperkalemia due to acute kidney injury. We'll given 1 dose of Kayexalate.  * HTN Cardizem  * DVT prophylaxis Lovenox  All the records are reviewed and case discussed with Care Management/Social Worker Management plans discussed with the patient, family and they are in agreement.  CODE STATUS: FULL CODE  DVT Prophylaxis: SCDs  TOTAL TIME TAKING CARE OF THIS PATIENT: 35 minutes.   POSSIBLE D/C IN 3-4 DAYS, DEPENDING ON CLINICAL CONDITION.  Milagros LollSudini, Boni Maclellan R M.D on 03/20/2017 at 11:12 AM  Between 7am to 6pm - Pager - [phone]  After 6pm go to [email or web address] - password [email or web address] [facility]  [facility]  Office  [phone]  CC: Primary care physician; Sherrie MustacheJadali, Fayegh  Note: This dictation was prepared with Dragon dictation along with smaller phrase technology. Any transcriptional errors that result from this process are unintentional.

## 2017-03-20 NOTE — Progress Notes (Signed)
Progress Note  Patient Name: [name] R [name] Date of Encounter: 03/20/2017  [facility]  Subjective   Improved shortness of breath compared to yesterday, still on high flow nasal cannula oxygen but this is being weaned. Improved cough Still with leg edema  Inpatient Medications    Scheduled Meds: . diltiazem  30 mg Oral Q8H  . ferrous sulfate  325 mg Oral Q breakfast  . fluticasone  1 spray Each Nare BID  . heparin  5,000 Units Subcutaneous Q8H  . insulin aspart  0-9 Units Subcutaneous TID WC  . ipratropium-albuterol  3 mL Nebulization Q4H  . loratadine  10 mg Oral Daily  . mouth rinse  15 mL Mouth Rinse BID  . methylPREDNISolone (SOLU-MEDROL) injection  60 mg Intravenous Q12H  . nebivolol  10 mg Oral BH-q7a  . pantoprazole  40 mg Oral Daily  . pregabalin  50 mg Oral BID  . rosuvastatin  10 mg Oral Daily  . tamsulosin  0.4 mg Oral Daily   Continuous Infusions: . ceFEPime (MAXIPIME) IV Stopped (03/20/17 1111)   PRN Meds: docusate sodium, guaiFENesin-dextromethorphan, metoprolol, ondansetron, traMADol   Vital Signs    Vitals:   03/20/17 0503 03/20/17 0734 03/20/17 1117 03/20/17 1219  BP: (!) 93/59   (!) 109/52  Pulse: 78   (!) 50  Resp: 16   (!) 21  Temp: 98.2 F (36.8 C)   98.1 F (36.7 C)  TempSrc: Oral   Oral  SpO2: 91% 92% 96% 91%  Weight:      Height:        Intake/Output Summary (Last 24 hours) at 03/20/17 1253 Last data filed at 03/20/17 0348  Gross per 24 hour  Intake            537.5 ml  Output              200 ml  Net            337.5 ml   Filed Weights   03/18/17 1332  Weight: (!) 348 lb (157.9 kg)    Telemetry    Atrial fibrillation rate in the 70s- Personally Reviewed  ECG      Physical Exam    GEN: No acute distress. Morbidly obese, on high flow nasal cannula oxygen  Neck: + JVD , unable to accurately assess as he is sitting up Cardiac: Irregularly irregular, no murmurs, rubs, or gallops.  Radials/DP/PT  2+ and equal bilaterally.  Respiratory:  Clear to auscultation bilaterally. GI: Soft, nontender, non-distended  MS: no deformity; trace pitting lower extremity edema to below the knees Neuro:  Alert and oriented x 3  Labs    Chemistry Recent Labs Lab 03/18/17 1342 03/19/17 0330 03/20/17 0422  NA 140 141 135  K 4.7 4.2 5.9*  CL 97* 98* 97*  CO2 34* 35* 32  GLUCOSE 142* 146* 250*  BUN 36* 34* 43*  CREATININE 1.61* 1.53* 2.17*  CALCIUM 9.0 8.8* 8.6*  GFRNONAA 43* 46* 30*  GFRAA 50* 53* 35*  ANIONGAP 9 8 6      Hematology Recent Labs Lab 03/18/17 1342 03/19/17 0330  WBC 6.9 6.6  RBC 4.43 4.48  HGB 12.8* 13.1  HCT 40.1 40.5  MCV 90.5 90.5  MCH 28.8 29.2  MCHC 31.9* 32.3  RDW 14.5 14.6*  PLT 125* 124*    Cardiac Enzymes Recent Labs Lab 03/18/17 1342  TROPONINI <0.03   No results for input(s): TROPIPOC in the last 168 hours.  BNPNo results for input(s): BNP, PROBNP in the last 168 hours.   DDimer No results for input(s): DDIMER in the last 168 hours.   Radiology    Dg Chest 2 View  Result Date: 03/18/2017 CLINICAL DATA:  Productive cough. EXAM: CHEST  2 VIEW COMPARISON:  03/05/2017. FINDINGS: Stable mildly enlarged cardiac silhouette. Mildly increased patchy opacity at the left lung base without significant change in mild patchy and linear opacity at the right lung base. Mild diffuse peribronchial thickening and accentuation of the interstitial markings is unchanged. Aortic arch calcifications and thoracic spine degenerative changes are again demonstrated. IMPRESSION: 1. Mildly progressive probable left lower lobe pneumonia. 2. Mildly progressive probable atelectasis at the right lung base. 3. Stable mild bronchitic changes and mild cardiomegaly. Electronically Signed   By: Beckie Salts M.D.   On: 03/18/2017 14:42    Cardiac Studies   Echocardiogram from yesterday with challenging image quality low normal LV function 50-55%, RV dilated with reduced function,  moderately elevated right heart pressures, rhythm is atrial fibrillation  Patient Profile     66 y.o. male with h/o morbid obesity, obstructive sleep apnea, hypertension, chronic hypercarbic respiratory failure, notes indicating COPD with no prior smoking history, chronic lower extremity edema who presents with worsening shortness of breath, noted to be in atrial fibrillation  Assessment & Plan    1. Acute on chronic respiratory distress Obesity hypoventilation, sleep apnea contributing New atrial fibrillation presenting on last hospital admission (he converted to atrial fibrillation while in the hospital 03/05/17) Moderate to severely elevated right heart pressures on echocardiogram consistent with diastolic CHF now with worsening renal failure (ATN) Weight is markedly elevated compared to previous hospital admission, 20+ pounds -Lasix infusion held for worsening renal function, followed by nephrology ----Respiratory support with high flow nasal cannula oxygen He was taking Lasix 40 daily at home. Once renal function improves, he may benefit from torsemide as outpt  2) atrial fibrillation, with RVR Converted from normal sinus rhythm to atrial fibrillation on last hospital admission 03/05/2017 aspirin and Plavix discontinued (primary care had started Plavix for unclear reasons) Continue eliquis 5 mg po BID After one month anticoagulation, could consider cardioversion For rate control continue beta blocker (bystolic),  diltiazem 30 mg every 8 hours with hold parameters. Potentially could hold the diltiazem once rate improves We will hold digoxin given renal dysfunction, only had 1 dose, rate improved  3) diabetes type 2 No recent hemoglobin A1c available, order placed On insulin  4) acute renal failure Dramatic worsening compared to 03/05/2017 Underlying diabetes, ATN Followed by nephrology   Total encounter time more than 25 minutes  Greater than 50% was spent in counseling and  coordination of care with the patient   Signed, Ernest Nordmann, MD  03/20/2017, 12:53 PM

## 2017-03-20 NOTE — Progress Notes (Signed)
Pharmacy Antibiotic Note  [name] is a 66 y.o. male admitted on 03/18/2017 with pneumonia.  Pharmacy has been consulted for cefepime and vancomycin dosing.  Plan: 1. Cefepime 2 gm IV Q8H 2. Vancomycin 1 gm IV x 1 followed in approximately 6 hours (stacked dosing) by vancomycin 1.25 gm IV Q12H, predicted trough 17 mcg/mL. Pharmacy will continue to follow and adjust as needed to maintain trough 15 to 20 mcg/ml.   Vd 72.9 L, Ke 0.06 hr-1, T1/2 11.5 hr  5/13:  Scr increased to 2.17 and Crcl 50 ml/min.  Will adjust Cefepime to 2 gram IV Q12h.     Height: 5\' 8"  (172.7 cm) Weight: (!) 348 lb (157.9 kg) IBW/kg (Calculated) : 68.4  Temp (24hrs), Avg:98.1 F (36.7 C), Min:98.1 F (36.7 C), Max:98.2 F (36.8 C)   Recent Labs Lab 03/18/17 1342 03/19/17 0330 03/20/17 0422  WBC 6.9 6.6  --   CREATININE 1.61* 1.53* 2.17*    Estimated Creatinine Clearance: 50 mL/min (A) (by C-G formula based on SCr of 2.17 mg/dL (H)).    Allergies  Allergen Reactions  . Metformin And Related Other (See Comments)    "shakes" per pt  . Morphine And Related Other (See Comments)    Hallucinations, sweats    Thank you for allowing pharmacy to be a part of this patient's care.  Damontre Millea A, Pharm.D., BCPS Clinical Pharmacist 03/20/2017 5:32 PM

## 2017-03-20 NOTE — Progress Notes (Signed)
PT Cancellation Note  Patient Details Name: [name] MRN: [id number] DOB: [birth year]   Cancelled Treatment:    Reason Eval/Treat Not Completed: Medical issues which prohibited therapy. Patient currently with K+ of 5.9, mobility is contra-indicated with K+ values of 5.0 or higher. Will defer mobility evaluation for later time/date as patient is more medically appropriate.    Alva GarnetPatrick McNamara PT, DPT, CSCS    03/20/2017, 12:52 PM

## 2017-03-20 NOTE — Progress Notes (Signed)
Patient remains on HFNC awake in no distress

## 2017-03-20 NOTE — Progress Notes (Signed)
Spoke with patient about using his bipap therapy during the day since he sleeps during the day and stays awake at night. Patient is currently eating his breakfast and states he will let the nurse know if he would like to use the bipap today.  RN aware.

## 2017-03-20 NOTE — Plan of Care (Signed)
Problem: Physical Regulation: Goal: Ability to maintain clinical measurements within normal limits will improve Outcome: Not Progressing G.F.R. dropped to only 30 today. Ernest FavreSteven M [facility]

## 2017-03-21 ENCOUNTER — Inpatient Hospital Stay: Payer: Medicare Other

## 2017-03-21 LAB — CBC
HEMATOCRIT: 39.2 % — AB (ref 40.0–52.0)
Hemoglobin: 12.4 g/dL — ABNORMAL LOW (ref 13.0–18.0)
MCH: 28.7 pg (ref 26.0–34.0)
MCHC: 31.7 g/dL — AB (ref 32.0–36.0)
MCV: 90.5 fL (ref 80.0–100.0)
Platelets: 152 10*3/uL (ref 150–440)
RBC: 4.33 MIL/uL — ABNORMAL LOW (ref 4.40–5.90)
RDW: 14.8 % — AB (ref 11.5–14.5)
WBC: 11.2 10*3/uL — ABNORMAL HIGH (ref 3.8–10.6)

## 2017-03-21 LAB — BASIC METABOLIC PANEL
ANION GAP: 8 (ref 5–15)
BUN: 63 mg/dL — AB (ref 6–20)
CHLORIDE: 94 mmol/L — AB (ref 101–111)
CO2: 32 mmol/L (ref 22–32)
Calcium: 8.1 mg/dL — ABNORMAL LOW (ref 8.9–10.3)
Creatinine, Ser: 2.71 mg/dL — ABNORMAL HIGH (ref 0.61–1.24)
GFR calc Af Amer: 27 mL/min — ABNORMAL LOW (ref >60)
GFR, EST NON AFRICAN AMERICAN: 23 mL/min — AB (ref >60)
GLUCOSE: 283 mg/dL — AB (ref 65–99)
POTASSIUM: 5.6 mmol/L — AB (ref 3.5–5.1)
Sodium: 134 mmol/L — ABNORMAL LOW (ref 135–145)

## 2017-03-21 LAB — PROTIME-INR
INR: 1.09
Prothrombin Time: 14.1 seconds (ref 11.4–15.2)

## 2017-03-21 LAB — HEMOGLOBIN A1C
Hgb A1c MFr Bld: 6.6 % — ABNORMAL HIGH (ref 4.8–5.6)
Mean Plasma Glucose: 143 mg/dL

## 2017-03-21 LAB — GLUCOSE, CAPILLARY
GLUCOSE-CAPILLARY: 205 mg/dL — AB (ref 65–99)
Glucose-Capillary: 251 mg/dL — ABNORMAL HIGH (ref 65–99)
Glucose-Capillary: 238 mg/dL — ABNORMAL HIGH (ref 65–99)
Glucose-Capillary: 231 mg/dL — ABNORMAL HIGH (ref 65–99)

## 2017-03-21 LAB — PROTEIN / CREATININE RATIO, URINE
Creatinine, Urine: 135 mg/dL
Protein Creatinine Ratio: 0.47 mg/mg{Cre} — ABNORMAL HIGH (ref 0.00–0.15)
TOTAL PROTEIN, URINE: 63 mg/dL

## 2017-03-21 LAB — HEPARIN LEVEL (UNFRACTIONATED): HEPARIN UNFRACTIONATED: 0.72 [IU]/mL — AB (ref 0.30–0.70)

## 2017-03-21 LAB — APTT: aPTT: 142 seconds — ABNORMAL HIGH (ref 24–36)

## 2017-03-21 MED ORDER — DEXTROSE 5 % IV SOLN
1.0000 g | INTRAVENOUS | Status: AC
  Administered 2017-03-22 – 2017-03-24 (×3): 1 g via INTRAVENOUS
  Filled 2017-03-21 (×3): qty 1

## 2017-03-21 MED ORDER — SODIUM POLYSTYRENE SULFONATE 15 GM/60ML PO SUSP
30.0000 g | Freq: Once | ORAL | Status: AC
  Administered 2017-03-21: 30 g via ORAL
  Filled 2017-03-21: qty 120

## 2017-03-21 MED ORDER — SODIUM CHLORIDE 0.9 % IV SOLN
INTRAVENOUS | Status: DC
  Administered 2017-03-21 – 2017-03-23 (×3): via INTRAVENOUS

## 2017-03-21 MED ORDER — IPRATROPIUM-ALBUTEROL 0.5-2.5 (3) MG/3ML IN SOLN
3.0000 mL | Freq: Three times a day (TID) | RESPIRATORY_TRACT | Status: DC
  Administered 2017-03-21 – 2017-03-26 (×12): 3 mL via RESPIRATORY_TRACT
  Filled 2017-03-21 (×12): qty 3

## 2017-03-21 MED ORDER — IPRATROPIUM-ALBUTEROL 0.5-2.5 (3) MG/3ML IN SOLN: 3.0000 mL | Freq: Four times a day (QID) | RESPIRATORY_TRACT | Status: DC | PRN

## 2017-03-21 MED ORDER — HEPARIN (PORCINE) IN NACL 100-0.45 UNIT/ML-% IJ SOLN
1600.0000 [IU]/h | INTRAMUSCULAR | Status: DC
  Administered 2017-03-21: 1500 [IU]/h via INTRAVENOUS
  Administered 2017-03-22 – 2017-03-23 (×3): 1400 [IU]/h via INTRAVENOUS
  Administered 2017-03-24 – 2017-03-25 (×3): 1600 [IU]/h via INTRAVENOUS
  Filled 2017-03-21 (×6): qty 250

## 2017-03-21 MED ORDER — HEPARIN BOLUS VIA INFUSION
5000.0000 [IU] | Freq: Once | INTRAVENOUS | Status: AC
  Administered 2017-03-21: 5000 [IU] via INTRAVENOUS
  Filled 2017-03-21: qty 5000

## 2017-03-21 MED ORDER — METOPROLOL TARTRATE 25 MG PO TABS
12.5000 mg | ORAL_TABLET | Freq: Four times a day (QID) | ORAL | Status: DC
  Administered 2017-03-21 – 2017-03-26 (×18): 12.5 mg via ORAL
  Filled 2017-03-21 (×18): qty 1

## 2017-03-21 NOTE — Progress Notes (Signed)
Placed patient on bipap earlier around 11pm. Patient continues to tolerate well while sitting in chair. No distress noted

## 2017-03-21 NOTE — Progress Notes (Signed)
Inpatient Diabetes Program Recommendations  AACE/ADA: New Consensus Statement on Inpatient Glycemic Control (2015)  Target Ranges:  Prepandial:   less than 140 mg/dL      Peak postprandial:   less than 180 mg/dL (1-2 hours)      Critically ill patients:  140 - 180 mg/dL   Lab Results  Component Value Date   GLUCAP 251 (H) 03/21/2017   HGBA1C 6.6 (H) 03/20/2017    Review of Glycemic Control:  Results for [name], [name] (MRN [id number]) as of 03/21/2017 11:06  Ref. Range 03/20/2017 08:44 03/20/2017 11:21 03/20/2017 16:18 03/20/2017 20:41 03/21/2017 07:36  Glucose-Capillary Latest Ref Range: 65 - 99 mg/dL 621229 (H) 308173 (H) 657163 (H) 165 (H) 251 (H)   Diabetes history: Type 2 diabetes Outpatient Diabetes medications: Glucotrol 10 mg bid Current orders for Inpatient glycemic control:  Novolog sensitive tid with meals, Solumedrol 60 mg IV q 12 hours  Inpatient Diabetes Program Recommendations:   Please consider increasing Novolog correction to moderate while patient is on steroids.  Thanks, Beryl MeagerJenny Airyonna Franklyn, RN, BC-ADM Inpatient Diabetes Coordinator Pager [phone] (8a-5p)

## 2017-03-21 NOTE — Progress Notes (Signed)
[employer] [facility] - [place] at [facility]   PATIENT NAME: [name]    MR#:  [id number]  DATE OF BIRTH:  26-Jan-1951  SUBJECTIVE:  CHIEF COMPLAINT:   Chief Complaint  Patient presents with  . Cough   Still has shortness of breath and cough. Was On 70% oxygen with high flow nasal cannula. Heart rate improved. Now on nasal canula oxygen 6 ltr/min.  REVIEW OF SYSTEMS:    Review of Systems  Constitutional: Positive for malaise/fatigue. Negative for chills and fever.  HENT: Negative for sore throat.   Eyes: Negative for blurred vision, double vision and pain.  Respiratory: Positive for cough and shortness of breath. Negative for hemoptysis and wheezing.   Cardiovascular: Positive for orthopnea. Negative for chest pain, palpitations and leg swelling.  Gastrointestinal: Negative for abdominal pain, constipation, diarrhea, heartburn, nausea and vomiting.  Genitourinary: Negative for dysuria and hematuria.  Musculoskeletal: Negative for back pain and joint pain.  Skin: Negative for rash.  Neurological: Positive for weakness. Negative for sensory change, speech change, focal weakness and headaches.  Endo/Heme/Allergies: Does not bruise/bleed easily.  Psychiatric/Behavioral: Negative for depression. The patient is not nervous/anxious.     DRUG ALLERGIES:   Allergies  Allergen Reactions  . Metformin And Related Other (See Comments)    "shakes" per pt  . Morphine And Related Other (See Comments)    Hallucinations, sweats    VITALS:  Blood pressure (!) 103/59, pulse 75, temperature 98.3 F (36.8 C), temperature source Oral, resp. rate 18, height 5\' 8"  (1.727 m), weight (!) 157.9 kg (348 lb), SpO2 93 %.  PHYSICAL EXAMINATION:   Physical Exam  GENERAL:  66 y.o.-year-old patient sitting in a chair. Morbidly obese. Conversational dyspnea.  EYES: Pupils equal, round, reactive to light and accommodation. No scleral icterus. Extraocular muscles intact.  HEENT: Head  atraumatic, normocephalic. Oropharynx and nasopharynx clear.  NECK:  Supple, no jugular venous distention. No thyroid enlargement, no tenderness.  LUNGS: Normal breath sounds bilaterally, no wheezing, Some crepitations. No use of accessory muscles of respiration.  CARDIOVASCULAR: S1, S2. Irregular. Tachycardic ABDOMEN: Soft, nontender, nondistended. Bowel sounds present. No organomegaly or mass.  EXTREMITIES: No cyanosis, clubbing. B/L LE edema NEUROLOGIC: Cranial nerves II through XII are intact. No focal Motor or sensory deficits b/l.   PSYCHIATRIC: The patient is alert and oriented x 3.  SKIN: No obvious rash, lesion, or ulcer.   LABORATORY PANEL:   CBC  Recent Labs Lab 03/21/17 1044  WBC 11.2*  HGB 12.4*  HCT 39.2*  PLT 152   ------------------------------------------------------------------------------------------------------------------ Chemistries   Recent Labs Lab 03/21/17 0444  NA 134*  K 5.6*  CL 94*  CO2 32  GLUCOSE 283*  BUN 63*  CREATININE 2.71*  CALCIUM 8.1*   ------------------------------------------------------------------------------------------------------------------  Cardiac Enzymes  Recent Labs Lab 03/18/17 1342  TROPONINI <0.03   ------------------------------------------------------------------------------------------------------------------  RADIOLOGY:  No results found.   ASSESSMENT AND PLAN:   * Acute on chronic resp failure due to COPD exacerbation and Bibasilar pneumonia On  cefepime. MRSA PCR negative. IV steroids .  Nebs Scheduled Was on HFNC oxygen, now on nasal canula today. Appreciate pulmonary input. I wanted to do CT angio chest to r/o PE, ordered on admisison and today again, but pt said- since many years- he feels very SOB and like to be in recliner only. Today again he insisted- he can not stay supine on CT scan table for few minutes for scan, cancelled imaging as he is clinically improving.  * Atrial  fibrillation Cardizem Improved HR Received 1 dose digoxin yesterday Appreciate cardiology input   Also on Bystolic.  * AKi over CKD3 likely due to lasix Consult nephrology.  worsening renal func.  * Hyperkalemia due to acute kidney injury. Given one dose kayexalate, improved some.  * HTN Cardizem, bystolic  * DVT prophylaxis Lovenox  All the records are reviewed and case discussed with Care Management/Social Worker Management plans discussed with the patient, family and they are in agreement.  CODE STATUS: FULL CODE  DVT Prophylaxis: SCDs  TOTAL TIME TAKING CARE OF THIS PATIENT: 35 minutes.   POSSIBLE D/C IN 1-2 DAYS, DEPENDING ON CLINICAL CONDITION.  Altamese DillingVACHHANI, Antonea Gaut M.D on 03/21/2017 at 3:11 PM  Between 7am to 6pm - Pager - [phone]  After 6pm go to [email or web address] - password [email or web address] [facility]  [facility] [place] [facility]  Office  [phone]  CC: Primary care physician; Sherrie MustacheJadali, Fayegh  Note: This dictation was prepared with Dragon dictation along with smaller phrase technology. Any transcriptional errors that result from this process are unintentional.

## 2017-03-21 NOTE — Telephone Encounter (Signed)
Consulted by DR Pulmonary. Will schedule per pulm at discharge.

## 2017-03-21 NOTE — Progress Notes (Signed)
[facility] [place], [place] 03/21/17  Subjective:  Renal function appears to be worse at the moment. BUN up to 63 with a creatinine of 2.71. Patient sitting up in chair today. Potassium still a bit high at 5.6.   Objective:  Vital signs in last 24 hours:  Temp:  [98 F (36.7 C)-98.3 F (36.8 C)] 98.3 F (36.8 C) (05/14 0741) Pulse Rate:  [72-75] 75 (05/14 0741) Resp:  [18] 18 (05/14 0741) BP: (93-110)/(49-59) 103/59 (05/14 0741) SpO2:  [92 %-97 %] 93 % (05/14 1121) FiO2 (%):  [50 %-60 %] 50 % (05/14 0432)  Weight change:  Filed Weights   03/18/17 1332  Weight: (!) 157.9 kg (348 lb)    Intake/Output:    Intake/Output Summary (Last 24 hours) at 03/21/17 1444 Last data filed at 03/21/17 1300  Gross per 24 hour  Intake              600 ml  Output              701 ml  Net             -101 ml     Physical Exam: General: NAD, sitting up in chair  HEENT anicteric  Neck supple  Pulm/lungs Normal effort, basilar rales  CVS/Heart S1S2 no rubs irregular  Abdomen:  Obese, non tender  Extremities: + dependent edema  Neurologic: Alert, oriented, follows commands  Skin: No acute rashes          Basic Metabolic Panel:   Recent Labs Lab 03/18/17 1342 03/19/17 0330 03/20/17 0422 03/21/17 0444  NA 140 141 135 134*  K 4.7 4.2 5.9* 5.6*  CL 97* 98* 97* 94*  CO2 34* 35* 32 32  GLUCOSE 142* 146* 250* 283*  BUN 36* 34* 43* 63*  CREATININE 1.61* 1.53* 2.17* 2.71*  CALCIUM 9.0 8.8* 8.6* 8.1*     CBC:  Recent Labs Lab 03/18/17 1342 03/19/17 0330 03/21/17 1044  WBC 6.9 6.6 11.2*  NEUTROABS 4.8  --   --   HGB 12.8* 13.1 12.4*  HCT 40.1 40.5 39.2*  MCV 90.5 90.5 90.5  PLT 125* 124* 152     No results found for: HEPBSAG, HEPBSAB, HEPBIGM    Microbiology:  Recent Results (from the past 240 hour(s))  Blood culture (routine x 2)     Status: None (Preliminary result)   Collection Time: 03/18/17  6:13 PM  Result Value Ref Range Status    Specimen Description BLOOD  R AC  Final   Special Requests BOTTLES DRAWN AEROBIC AND ANAEROBIC BCAV  Final   Culture NO GROWTH 3 DAYS  Final   Report Status PENDING  Incomplete  Blood culture (routine x 2)     Status: None (Preliminary result)   Collection Time: 03/18/17  6:13 PM  Result Value Ref Range Status   Specimen Description BLOOD  L FOREARM  Final   Special Requests BOTTLES DRAWN AEROBIC AND ANAEROBIC BCHV  Final   Culture NO GROWTH 3 DAYS  Final   Report Status PENDING  Incomplete    Coagulation Studies:  Recent Labs  03/21/17 1044  LABPROT 14.1  INR 1.09    Urinalysis: No results for input(s): COLORURINE, LABSPEC, PHURINE, GLUCOSEU, HGBUR, BILIRUBINUR, KETONESUR, PROTEINUR, UROBILINOGEN, NITRITE, LEUKOCYTESUR in the last 72 hours.  Invalid input(s): APPERANCEUR    Imaging: No results found.   Medications:   . [START ON 03/22/2017] ceFEPime (MAXIPIME) IV    . heparin 1,500 Units/hr (03/21/17 0917)   .  diltiazem  30 mg Oral Q8H  . ferrous sulfate  325 mg Oral Q breakfast  . fluticasone  1 spray Each Nare BID  . insulin aspart  0-9 Units Subcutaneous TID WC  . ipratropium-albuterol  3 mL Nebulization Q4H  . loratadine  10 mg Oral Daily  . mouth rinse  15 mL Mouth Rinse BID  . methylPREDNISolone (SOLU-MEDROL) injection  60 mg Intravenous Q12H  . nebivolol  10 mg Oral BH-q7a  . pantoprazole  40 mg Oral Daily  . pregabalin  50 mg Oral BID  . rosuvastatin  10 mg Oral Daily  . tamsulosin  0.4 mg Oral Daily   docusate sodium, guaiFENesin-dextromethorphan, metoprolol tartrate, ondansetron, traMADol  Assessment/ Plan:  66 y.o. male with medical problems of morbid obesity, chronic respiratory failure, chronic congestive heart failure, sleep apnea, type 2 diabetes for over 10 years, history of MI, history of kidney stone, atrial fibrillation February 2017 admitted for pneumonia  1.  ARF - likely ATN from hypotension and pneumonia Baseline Cr 1.02 from  03/04/2017 -  Renal function continues to worsen.  Check renal ultrasound and also start the patient on gentle IV fluid hydration with 0.9 normal saline at 50 cc per hour.  Followup renal function tomorrow.  2. LE edema - Possibly combination of lymphedema and 3rd spacing -  Lasix currently on hold.  3. Hyperkalemia - start the patient on patiromer 8.4 g by mouth daily.  4. Bacterial Pneumonia Abx as per primary team    LOS: 3 Blayne Garlick 5/14/20182:44 PM  [facility] [place], [place] [phone]

## 2017-03-21 NOTE — Progress Notes (Addendum)
ANTICOAGULATION CONSULT NOTE - Initial Consult  Pharmacy Consult for Heparin  Indication: atrial fibrillation   Allergies  Allergen Reactions  . Metformin And Related Other (See Comments)    "shakes" per pt  . Morphine And Related Other (See Comments)    Hallucinations, sweats    Patient Measurements: Height: 5\' 8"  (172.7 cm) Weight: (!) 348 lb (157.9 kg) IBW/kg (Calculated) : 68.4 Heparin Dosing Weight: 107  Vital Signs: Temp: 98.5 F (36.9 C) (05/14 1929) Temp Source: Oral (05/14 1929) BP: 101/67 (05/14 1931) Pulse Rate: 84 (05/14 1931)  Labs:  Recent Labs  03/19/17 0330 03/20/17 0422 03/21/17 0444 03/21/17 1044 03/21/17 2017  HGB 13.1  --   --  12.4*  --   HCT 40.5  --   --  39.2*  --   PLT 124*  --   --  152  --   APTT  --   --   --  142*  --   LABPROT  --   --   --  14.1  --   INR  --   --   --  1.09  --   HEPARINUNFRC  --   --   --   --  0.72*  CREATININE 1.53* 2.17* 2.71*  --   --     Estimated Creatinine Clearance: 40.1 mL/min (A) (by C-G formula based on SCr of 2.71 mg/dL (H)).   Medical History: Past Medical History:  Diagnosis Date  . Asthma   . COPD (chronic obstructive pulmonary disease) (HCC)   . Heart attack (HCC)   . High blood pressure   . High cholesterol   . Scoliosis   . Seasonal allergies   . Sleep apnea     Medications:  Prescriptions Prior to Admission  Medication Sig Dispense Refill Last Dose  . aspirin EC 81 MG tablet Take 81 mg by mouth daily.   03/18/2017 at Unknown time  . cetirizine (ZYRTEC) 10 MG tablet Take 10 mg by mouth daily.   03/18/2017 at Unknown time  . clopidogrel (PLAVIX) 75 MG tablet Take 75 mg by mouth daily.   03/18/2017 at Unknown time  . ferrous sulfate 325 (65 FE) MG EC tablet Take 325 mg by mouth daily with breakfast.   03/18/2017 at Unknown time  . furosemide (LASIX) 40 MG tablet Take 40 mg by mouth 2 (two) times daily.    03/18/2017 at Unknown time  . glipiZIDE (GLUCOTROL) 10 MG tablet Take 10 mg by mouth  2 (two) times daily before a meal.   03/18/2017 at Unknown time  . Nebivolol HCl 20 MG TABS Take 10-40 mg by mouth 2 (two) times daily. 40mg  in the morning and 10mg  at bedtime   03/18/2017 at Unknown time  . omeprazole (PRILOSEC) 20 MG capsule Take 20 mg by mouth daily.   03/18/2017 at Unknown time  . OXYGEN Inhale 2 L into the lungs continuous.   03/18/2017 at Unknown time  . polyethylene glycol powder (GLYCOLAX/MIRALAX) powder Dissolve one heaping tablespoon in 4-8 ounces of juice or water and drink once daily. (Patient taking differently: Take 17 g by mouth daily as needed for mild constipation. ) 255 g 0 prn at prn  . pregabalin (LYRICA) 50 MG capsule Take 50 mg by mouth 2 (two) times daily.   03/18/2017 at Unknown time  . rosuvastatin (CRESTOR) 10 MG tablet Take 10 mg by mouth daily. Reported on 04/22/2016   03/18/2017 at Unknown time  . tamsulosin (FLOMAX) 0.4 MG CAPS  capsule Take 0.4 mg by mouth daily.   03/18/2017 at Unknown time  . telmisartan (MICARDIS) 80 MG tablet Take 80 mg by mouth daily.   03/18/2017 at Unknown time  . Vitamin D, Ergocalciferol, (DRISDOL) 50000 units CAPS capsule Take 50,000 Units by mouth every 7 (seven) days. Patient takes on Monday   Past Week at Unknown time   Scheduled:  . ferrous sulfate  325 mg Oral Q breakfast  . fluticasone  1 spray Each Nare BID  . insulin aspart  0-9 Units Subcutaneous TID WC  . ipratropium-albuterol  3 mL Nebulization TID  . loratadine  10 mg Oral Daily  . mouth rinse  15 mL Mouth Rinse BID  . methylPREDNISolone (SOLU-MEDROL) injection  60 mg Intravenous Q12H  . metoprolol tartrate  12.5 mg Oral Q6H  . pantoprazole  40 mg Oral Daily  . pregabalin  50 mg Oral BID  . rosuvastatin  10 mg Oral Daily  . tamsulosin  0.4 mg Oral Daily    Assessment: Pharmacy consulted to dose and monitor heparin therapy in this 66 year old male being treated for atrial fibrillation  Goal of Therapy:  Heparin level 0.3-0.7 units/ml Monitor platelets by  anticoagulation protocol: Yes   Plan:  Give 5000 units bolus x 1 Start heparin infusion at 1500 units/hr Check anti-Xa level in 8 hours and daily while on heparin Continue to monitor H&H and platelets  Will check Heparin level @ 1730 since patient is in AKI and CrCl can't be accurately measured to determine renal function in this patient.   5/14:  HL @ 20:17 = 0.72.  Will decrease drip rate to 1400 units/hr and recheck HL 6 hrs after rate change.   05/15 03:30 heparin level 0.62. Continue current regimen. Recheck in 6 hours to confirm.   Robbins,Jason D 03/21/2017,8:57 PM

## 2017-03-21 NOTE — Progress Notes (Signed)
PT Cancellation Note  Patient Details Name: Marisa HuaGordon R Alers MRN: [id number] DOB: 29-Jun-1951   Cancelled Treatment:    Reason Eval/Treat Not Completed: Medical issues which prohibited therapy, Pt continues with elevated K+ and low BP. Not appropriate for therapy intervention at this time. Will re-attempt once further next date.   Jacqualin Shirkey 03/21/2017, 8:41 AM Elizabeth PalauStephanie Kristy Catoe, PT, DPT [phone]

## 2017-03-21 NOTE — Progress Notes (Signed)
Patient just taken off bipap by RN and placed on HFNC without difficulty.  SVN given. Patient tolerated well

## 2017-03-21 NOTE — Progress Notes (Signed)
ANTICOAGULATION CONSULT NOTE - Initial Consult  Pharmacy Consult for Heparin  Indication: atrial fibrillation   Allergies  Allergen Reactions  . Metformin And Related Other (See Comments)    "shakes" per pt  . Morphine And Related Other (See Comments)    Hallucinations, sweats    Patient Measurements: Height: 5\' 8"  (172.7 cm) Weight: (!) 348 lb (157.9 kg) IBW/kg (Calculated) : 68.4 Heparin Dosing Weight: 107  Vital Signs: Temp: 98.3 F (36.8 C) (05/14 0741) Temp Source: Oral (05/14 0741) BP: 103/59 (05/14 0741) Pulse Rate: 75 (05/14 0741)  Labs:  Recent Labs  03/18/17 1342 03/19/17 0330 03/20/17 0422 03/21/17 0444  HGB 12.8* 13.1  --   --   HCT 40.1 40.5  --   --   PLT 125* 124*  --   --   CREATININE 1.61* 1.53* 2.17* 2.71*  TROPONINI <0.03  --   --   --     Estimated Creatinine Clearance: 40.1 mL/min (A) (by C-G formula based on SCr of 2.71 mg/dL (H)).   Medical History: Past Medical History:  Diagnosis Date  . Asthma   . COPD (chronic obstructive pulmonary disease) (HCC)   . Heart attack (HCC)   . High blood pressure   . High cholesterol   . Scoliosis   . Seasonal allergies   . Sleep apnea     Medications:  Prescriptions Prior to Admission  Medication Sig Dispense Refill Last Dose  . aspirin EC 81 MG tablet Take 81 mg by mouth daily.   03/18/2017 at Unknown time  . cetirizine (ZYRTEC) 10 MG tablet Take 10 mg by mouth daily.   03/18/2017 at Unknown time  . clopidogrel (PLAVIX) 75 MG tablet Take 75 mg by mouth daily.   03/18/2017 at Unknown time  . ferrous sulfate 325 (65 FE) MG EC tablet Take 325 mg by mouth daily with breakfast.   03/18/2017 at Unknown time  . furosemide (LASIX) 40 MG tablet Take 40 mg by mouth 2 (two) times daily.    03/18/2017 at Unknown time  . glipiZIDE (GLUCOTROL) 10 MG tablet Take 10 mg by mouth 2 (two) times daily before a meal.   03/18/2017 at Unknown time  . Nebivolol HCl 20 MG TABS Take 10-40 mg by mouth 2 (two) times daily.  40mg  in the morning and 10mg  at bedtime   03/18/2017 at Unknown time  . omeprazole (PRILOSEC) 20 MG capsule Take 20 mg by mouth daily.   03/18/2017 at Unknown time  . OXYGEN Inhale 2 L into the lungs continuous.   03/18/2017 at Unknown time  . polyethylene glycol powder (GLYCOLAX/MIRALAX) powder Dissolve one heaping tablespoon in 4-8 ounces of juice or water and drink once daily. (Patient taking differently: Take 17 g by mouth daily as needed for mild constipation. ) 255 g 0 prn at prn  . pregabalin (LYRICA) 50 MG capsule Take 50 mg by mouth 2 (two) times daily.   03/18/2017 at Unknown time  . rosuvastatin (CRESTOR) 10 MG tablet Take 10 mg by mouth daily. Reported on 04/22/2016   03/18/2017 at Unknown time  . tamsulosin (FLOMAX) 0.4 MG CAPS capsule Take 0.4 mg by mouth daily.   03/18/2017 at Unknown time  . telmisartan (MICARDIS) 80 MG tablet Take 80 mg by mouth daily.   03/18/2017 at Unknown time  . Vitamin D, Ergocalciferol, (DRISDOL) 50000 units CAPS capsule Take 50,000 Units by mouth every 7 (seven) days. Patient takes on Monday   Past Week at Unknown time  Scheduled:  . diltiazem  30 mg Oral Q8H  . ferrous sulfate  325 mg Oral Q breakfast  . fluticasone  1 spray Each Nare BID  . insulin aspart  0-9 Units Subcutaneous TID WC  . ipratropium-albuterol  3 mL Nebulization Q4H  . loratadine  10 mg Oral Daily  . mouth rinse  15 mL Mouth Rinse BID  . methylPREDNISolone (SOLU-MEDROL) injection  60 mg Intravenous Q12H  . nebivolol  10 mg Oral BH-q7a  . pantoprazole  40 mg Oral Daily  . pregabalin  50 mg Oral BID  . rosuvastatin  10 mg Oral Daily  . tamsulosin  0.4 mg Oral Daily    Assessment: Pharmacy consulted to dose and monitor heparin therapy in this 66 year old male being treated for atrial fibrillation  Goal of Therapy:  Heparin level 0.3-0.7 units/ml Monitor platelets by anticoagulation protocol: Yes   Plan:  Give 5000 units bolus x 1 Start heparin infusion at 1500 units/hr Check  anti-Xa level in 8 hours and daily while on heparin Continue to monitor H&H and platelets  Will check Heparin level @ 1730 since patient is in AKI and CrCl can't be accurately measured to determine renal function in this patient.   Franny Selvage D 03/21/2017,10:27 AM

## 2017-03-21 NOTE — Progress Notes (Signed)
Patient Name: [name] Date of Encounter: 03/21/2017  Primary Cardiologist: New to [facility] - consult by [facility] Problem List     Principal Problem:   Healthcare-associated pneumonia Active Problems:   HCAP (healthcare-associated pneumonia)   ATN (acute tubular necrosis) (HCC)   Acute renal failure with acute tubular necrosis superimposed on stage 3 chronic kidney disease (HCC)   Morbid obesity (HCC)   Hypoxia   Persistent atrial fibrillation (HCC)     Subjective   Feels "great" this morning. Remains on HFNC. No recent weights. Minimal documented UOP. Renal function continues to climb, up to 2.71 from 2.17. BUN 43-->63. Potassium 5.9-->5.6. No recent CBC. Remains in Afib with heart rate in the low 100s to 110s bpm. LE edema remains. Sitting up in recliner on HFNC, legs not elevated. No chest pain.   Inpatient Medications    Scheduled Meds: . diltiazem  30 mg Oral Q8H  . ferrous sulfate  325 mg Oral Q breakfast  . fluticasone  1 spray Each Nare BID  . heparin  5,000 Units Subcutaneous Q8H  . insulin aspart  0-9 Units Subcutaneous TID WC  . ipratropium-albuterol  3 mL Nebulization Q4H  . loratadine  10 mg Oral Daily  . mouth rinse  15 mL Mouth Rinse BID  . methylPREDNISolone (SOLU-MEDROL) injection  60 mg Intravenous Q12H  . nebivolol  10 mg Oral BH-q7a  . pantoprazole  40 mg Oral Daily  . pregabalin  50 mg Oral BID  . rosuvastatin  10 mg Oral Daily  . tamsulosin  0.4 mg Oral Daily   Continuous Infusions: . ceFEPime (MAXIPIME) IV 2 g (03/21/17 0625)   PRN Meds: docusate sodium, guaiFENesin-dextromethorphan, metoprolol, ondansetron, traMADol   Vital Signs    Vitals:   03/20/17 2010 03/20/17 2313 03/21/17 0426 03/21/17 0741  BP: (!) 110/55  (!) 95/49 (!) 103/59  Pulse:   72 75  Resp:   18 18  Temp:   98 F (36.7 C) 98.3 F (36.8 C)  TempSrc:   Oral Oral  SpO2:  93% 92% 93%  Weight:      Height:        Intake/Output Summary (Last 24 hours) at  03/21/17 0824 Last data filed at 03/21/17 1610  Gross per 24 hour  Intake              360 ml  Output              601 ml  Net             -241 ml   Filed Weights   03/18/17 1332  Weight: (!) 348 lb (157.9 kg)    Physical Exam    GEN: Well nourished, well developed, in no acute distress.  HEENT: Grossly normal.  Neck: Supple, JVD difficult to assess 2/2 body habitus, no carotid bruits, or masses. Cardiac: Irregularly irregular, no murmurs, rubs, or gallops. No clubbing, cyanosis, trace bilateral LE edema.  Radials/DP/PT 2+ and equal bilaterally.  Respiratory:  Diminished breath sounds bilaterally with faint bibasilar crackles. On HFNC. GI: Obese, soft, nontender, nondistended, BS + x 4. MS: no deformity or atrophy. Skin: warm and dry, no rash. Neuro:  Strength and sensation are intact. Psych: AAOx3.  Normal affect.  Labs    CBC  Recent Labs  03/18/17 1342 03/19/17 0330  WBC 6.9 6.6  NEUTROABS 4.8  --   HGB 12.8* 13.1  HCT 40.1 40.5  MCV 90.5 90.5  PLT 125*  124*   Basic Metabolic Panel  Recent Labs  03/20/17 0422 03/21/17 0444  NA 135 134*  K 5.9* 5.6*  CL 97* 94*  CO2 32 32  GLUCOSE 250* 283*  BUN 43* 63*  CREATININE 2.17* 2.71*  CALCIUM 8.6* 8.1*   Liver Function Tests No results for input(s): AST, ALT, ALKPHOS, BILITOT, PROT, ALBUMIN in the last 72 hours. No results for input(s): LIPASE, AMYLASE in the last 72 hours. Cardiac Enzymes  Recent Labs  03/18/17 1342  TROPONINI <0.03   BNP Invalid input(s): POCBNP D-Dimer No results for input(s): DDIMER in the last 72 hours. Hemoglobin A1C No results for input(s): HGBA1C in the last 72 hours. Fasting Lipid Panel No results for input(s): CHOL, HDL, LDLCALC, TRIG, CHOLHDL, LDLDIRECT in the last 72 hours. Thyroid Function Tests No results for input(s): TSH, T4TOTAL, T3FREE, THYROIDAB in the last 72 hours.  Invalid input(s): FREET3  Telemetry    Afib with RVR with heart rates in the low 100s to  110s bpm - Personally Reviewed  ECG    n/a - Personally Reviewed  Radiology    No results found.  Cardiac Studies   TTE 03/19/17: Study Conclusions  - Left ventricle: The cavity size was normal. Systolic function was   normal. The estimated ejection fraction was in the range of 50%   to 55%. Regional wall motion abnormalities cannot be excluded.   The study is not technically sufficient to allow evaluation of LV   diastolic function. - Right ventricle: The cavity size was moderately dilated. Wall   thickness was normal. Systolic function was moderately reduced. - Pulmonary arteries: Systolic pressure was moderately elevated. PA   peak pressure: 55 mm Hg (S).  Impressions:  - Rhythm is atrial fibrillation. Challenging images secondary to   body habitus and patient sitting in a chair on high flow oxygen.  Patient Profile     66 y.o. male with history of morbid obesity, OSA/possible OHS, hypertension, chronic hypercarbic respiratory failure, notes indicating COPD with no prior smoking history, chronic lower extremity edema  who presented with worsening shortness of breath, noted to have HCAP, ARF and be in atrial fibrillation with RVR.   Assessment & Plan    1. Persistent Afib with RVR: -First noted during admission in 02/2017 -Remains in Afib with RVR with heart rates in the low 100s to 110s bpm, asymptomatic  -Diltiazem and Bystolic have been held this morning 2/2 soft BP in the 90s systolic -Not a candidate for digoxin given ARF -Systolic BP has since improved to 103 most recently as of 7:41 AM -Consider restarting short-acting diltiazem 30 mg q 6 hours with holding parameters of < 100 mmHg systolic -Has not been on anticoagulation this admission, restart Eliquis 5 mg bid if there are no planned interventions, otherwise would use heparin gtt as a bridge then transition to Eliquis prior to discharge -Will ultimately need improvement in respiratory status in an effort to  continue to improve his heart rate -Would avoid TEE/DCCV at this time as he has not been anticoagulated and given his respiratory status he is unlikely to maintain sinus rhythm -Could consider outpatient DCCV after he has received uninterrupted full-dose anticoagulation x 3 weeks  -CHADS2VASCv at least 5 (CHF, HTN, age x 1, DM2, vascular disease)  2. Acute on chronic respiratory failure: -Likely multifactorial including HCAP, Afib with RVR, pulmonary hypertension, chronic diastolic CHF, obesity, OSA/OHS -On HFNC per primary service, wean as able -As below -Daily standing scale weights and strict  Is & Os  3. Acute on chronic diastolic CHF/pulmonary HTN: -Lasix has been held since 5/12 given ARF -Pulmonary pressure of 55 mmHg on recent TTE this admission, so suspect he is volume overloaded -Continue to hold Lasix given worsening renal function -HFNC as above  4. ARF: -Labs appear to indicate he is dry though given his echo is appears volume overloaded -Possible ATN -Renal on board  5. Hyperkalemia: -Improving -Monitor -Consider follow up potassium level this afternoon -Per Renal  6. HCAP: -Per IM  Signed, Eula Listen, PA-C [facility] Pager: [phone] 03/21/2017, 8:24 AM

## 2017-03-21 NOTE — Plan of Care (Signed)
Problem: Safety: Goal: Ability to remain free from injury will improve Outcome: Progressing Fall precautions, non skid socks  Problem: Pain Managment: Goal: General experience of comfort will improve Outcome: Progressing Prn medications  Problem: Physical Regulation: Goal: Will remain free from infection Outcome: Not Progressing Iv antibiotics  Problem: Tissue Perfusion: Goal: Risk factors for ineffective tissue perfusion will decrease Outcome: Progressing SQ heparin

## 2017-03-21 NOTE — Progress Notes (Signed)
Palliative Medicine consult noted. Due to high referral volume, there may be a delay seeing this patient. Please call the Palliative Medicine Team office at [phone] if recommendations are needed in the interim.  Thank you for inviting us to see this patient.  Margret ChanceMelanie G. Zuleima Haser, RN, BSN, [facility] 03/21/2017 11:24 AM Cell [phone] 8:00-4:00 Monday-Friday Office [phone]

## 2017-03-21 NOTE — Progress Notes (Signed)
Pharmacy Antibiotic Note  [name] is a 66 y.o. male admitted on 03/18/2017 with pneumonia.  Pharmacy has been consulted for cefepime and vancomycin dosing.  Plan:  5/14: Scr increased again to 2.71 (definition of AKI). Will adjust to Cefepime 1 g IV q24 hours (dosing as if CrCl <10 ml/min)     Height: 5\' 8"  (172.7 cm) Weight: (!) 348 lb (157.9 kg) IBW/kg (Calculated) : 68.4  Temp (24hrs), Avg:98.1 F (36.7 C), Min:98 F (36.7 C), Max:98.3 F (36.8 C)   Recent Labs Lab 03/18/17 1342 03/19/17 0330 03/20/17 0422 03/21/17 0444  WBC 6.9 6.6  --   --   CREATININE 1.61* 1.53* 2.17* 2.71*    Estimated Creatinine Clearance: 40.1 mL/min (A) (by C-G formula based on SCr of 2.71 mg/dL (H)).    Allergies  Allergen Reactions  . Metformin And Related Other (See Comments)    "shakes" per pt  . Morphine And Related Other (See Comments)    Hallucinations, sweats    Thank you for allowing pharmacy to be a part of this patient's care.  Lynden Flemmer D, Pharm.D., BCPS Clinical Pharmacist 03/21/2017 10:43 AM

## 2017-03-21 NOTE — Care Management Note (Signed)
Case Management Note  Patient Details  Name: Marisa HuaGordon R Kahan MRN: [id number] Date of Birth: [birth year]  Subjective/Objective:                 [facility]- SN and PT   Action/Plan:     Notified agency of admission  Expected Discharge Date:                  Expected Discharge Plan:     In-House Referral:     Discharge planning Services     Post Acute Care Choice:    Choice offered to:     DME Arranged:    DME Agency:     HH Arranged:    HH Agency:     Status of Service:     If discussed at [employer] Meetings, dates discussed:    Additional Comments:  Eber HongGreene, Skylie Hiott R, RN 03/21/2017, 2:47 PM

## 2017-03-21 NOTE — Progress Notes (Signed)
Notified Dr Tobi BastosPyreddy of BP of 94/46. Held cardizem and bystolic

## 2017-03-22 LAB — CBC
HCT: 38.2 % — ABNORMAL LOW (ref 40.0–52.0)
Hemoglobin: 12.2 g/dL — ABNORMAL LOW (ref 13.0–18.0)
MCH: 28.7 pg (ref 26.0–34.0)
MCHC: 32 g/dL (ref 32.0–36.0)
MCV: 89.8 fL (ref 80.0–100.0)
PLATELETS: 145 10*3/uL — AB (ref 150–440)
RBC: 4.26 MIL/uL — ABNORMAL LOW (ref 4.40–5.90)
RDW: 14.6 % — AB (ref 11.5–14.5)
WBC: 8.8 10*3/uL (ref 3.8–10.6)

## 2017-03-22 LAB — BASIC METABOLIC PANEL
ANION GAP: 8 (ref 5–15)
BUN: 69 mg/dL — ABNORMAL HIGH (ref 6–20)
CALCIUM: 8.2 mg/dL — AB (ref 8.9–10.3)
CO2: 33 mmol/L — AB (ref 22–32)
Chloride: 94 mmol/L — ABNORMAL LOW (ref 101–111)
Creatinine, Ser: 2.82 mg/dL — ABNORMAL HIGH (ref 0.61–1.24)
GFR calc Af Amer: 25 mL/min — ABNORMAL LOW (ref >60)
GFR, EST NON AFRICAN AMERICAN: 22 mL/min — AB (ref >60)
Glucose, Bld: 284 mg/dL — ABNORMAL HIGH (ref 65–99)
POTASSIUM: 5.2 mmol/L — AB (ref 3.5–5.1)
Sodium: 135 mmol/L (ref 135–145)

## 2017-03-22 LAB — PROTEIN ELECTRO, RANDOM URINE
ALPHA-1-GLOBULIN, U: 5.9 %
ALPHA-2-GLOBULIN, U: 10.3 %
Albumin ELP, Urine: 26.7 %
BETA GLOBULIN, U: 28.3 %
Gamma Globulin, U: 28.8 %
TOTAL PROTEIN, URINE-UPE24: 37.2 mg/dL

## 2017-03-22 LAB — GLUCOSE, CAPILLARY
Glucose-Capillary: 219 mg/dL — ABNORMAL HIGH (ref 65–99)
Glucose-Capillary: 269 mg/dL — ABNORMAL HIGH (ref 65–99)
Glucose-Capillary: 301 mg/dL — ABNORMAL HIGH (ref 65–99)
Glucose-Capillary: 188 mg/dL — ABNORMAL HIGH (ref 65–99)

## 2017-03-22 LAB — HEPARIN LEVEL (UNFRACTIONATED)
HEPARIN UNFRACTIONATED: 0.62 [IU]/mL (ref 0.30–0.70)
Heparin Unfractionated: 0.44 IU/mL (ref 0.30–0.70)

## 2017-03-22 MED ORDER — INSULIN GLARGINE 100 UNIT/ML ~~LOC~~ SOLN
16.0000 [IU] | Freq: Every day | SUBCUTANEOUS | Status: DC
  Filled 2017-03-22: qty 0.16

## 2017-03-22 MED ORDER — PATIROMER SORBITEX CALCIUM 8.4 G PO PACK
8.4000 g | PACK | Freq: Every day | ORAL | Status: DC
  Administered 2017-03-22 – 2017-03-23 (×2): 8.4 g via ORAL
  Filled 2017-03-22 (×2): qty 4

## 2017-03-22 MED ORDER — INSULIN GLARGINE 100 UNIT/ML ~~LOC~~ SOLN
16.0000 [IU] | Freq: Every day | SUBCUTANEOUS | Status: DC
  Administered 2017-03-22 – 2017-03-25 (×4): 16 [IU] via SUBCUTANEOUS
  Filled 2017-03-22 (×6): qty 0.16

## 2017-03-22 MED ORDER — INSULIN ASPART 100 UNIT/ML ~~LOC~~ SOLN
0.0000 [IU] | Freq: Three times a day (TID) | SUBCUTANEOUS | Status: DC
  Administered 2017-03-22: 11 [IU] via SUBCUTANEOUS
  Administered 2017-03-23: 15 [IU] via SUBCUTANEOUS
  Administered 2017-03-23: 8 [IU] via SUBCUTANEOUS
  Administered 2017-03-23: 5 [IU] via SUBCUTANEOUS
  Filled 2017-03-22: qty 15
  Filled 2017-03-22: qty 8
  Filled 2017-03-22: qty 11
  Filled 2017-03-22: qty 5

## 2017-03-22 NOTE — Progress Notes (Signed)
Pt refusing us of kidneys / MD made aware

## 2017-03-22 NOTE — Progress Notes (Signed)
Checked on this patient earlier for bipap therapy. Patient declined at the time due to having to get up to use bedside toliet.  Patient states he has been given meds to help alleviate stool.

## 2017-03-22 NOTE — Progress Notes (Signed)
[facility] - [place] at [facility]   PATIENT NAME: [name]    MR#:  [id number]  DATE OF BIRTH:  1950-11-25  SUBJECTIVE:  CHIEF COMPLAINT:   Chief Complaint  Patient presents with  . Cough   Still has shortness of breath and cough. Was On 70% oxygen with high flow nasal cannula. Heart rate improved. Converted to sinus rhythm. Now on nasal canula oxygen 6 ltr/min.   Sitting in recliner without any complaints.  REVIEW OF SYSTEMS:    Review of Systems  Constitutional: Positive for malaise/fatigue. Negative for chills and fever.  HENT: Negative for sore throat.   Eyes: Negative for blurred vision, double vision and pain.  Respiratory: Positive for cough and shortness of breath. Negative for hemoptysis and wheezing.   Cardiovascular: Positive for orthopnea. Negative for chest pain, palpitations and leg swelling.  Gastrointestinal: Negative for abdominal pain, constipation, diarrhea, heartburn, nausea and vomiting.  Genitourinary: Negative for dysuria and hematuria.  Musculoskeletal: Negative for back pain and joint pain.  Skin: Negative for rash.  Neurological: Positive for weakness. Negative for sensory change, speech change, focal weakness and headaches.  Endo/Heme/Allergies: Does not bruise/bleed easily.  Psychiatric/Behavioral: Negative for depression. The patient is not nervous/anxious.     DRUG ALLERGIES:   Allergies  Allergen Reactions  . Metformin And Related Other (See Comments)    "shakes" per pt  . Morphine And Related Other (See Comments)    Hallucinations, sweats    VITALS:  Blood pressure 124/66, pulse 73, temperature 97.8 F (36.6 C), temperature source Oral, resp. rate 20, height 5\' 8"  (1.727 m), weight (!) 157.9 kg (348 lb), SpO2 100 %.  PHYSICAL EXAMINATION:   Physical Exam  GENERAL:  66 y.o.-year-old patient sitting in a chair. Morbidly obese. Conversational dyspnea.  EYES: Pupils equal, round, reactive to light and  accommodation. No scleral icterus. Extraocular muscles intact.  HEENT: Head atraumatic, normocephalic. Oropharynx and nasopharynx clear.  NECK:  Supple, no jugular venous distention. No thyroid enlargement, no tenderness.  LUNGS: Normal breath sounds bilaterally, no wheezing, Some crepitations. No use of accessory muscles of respiration.  CARDIOVASCULAR: S1, S2. Irregular. Tachycardic ABDOMEN: Soft, nontender, nondistended. Bowel sounds present. No organomegaly or mass.  EXTREMITIES: No cyanosis, clubbing. B/L LE edema NEUROLOGIC: Cranial nerves II through XII are intact. No focal Motor or sensory deficits b/l.   PSYCHIATRIC: The patient is alert and oriented x 3.  SKIN: No obvious rash, lesion, or ulcer.   LABORATORY PANEL:   CBC  Recent Labs Lab 03/22/17 1027  WBC 8.8  HGB 12.2*  HCT 38.2*  PLT 145*   ------------------------------------------------------------------------------------------------------------------ Chemistries   Recent Labs Lab 03/22/17 0342  NA 135  K 5.2*  CL 94*  CO2 33*  GLUCOSE 284*  BUN 69*  CREATININE 2.82*  CALCIUM 8.2*   ------------------------------------------------------------------------------------------------------------------  Cardiac Enzymes  Recent Labs Lab 03/18/17 1342  TROPONINI <0.03   ------------------------------------------------------------------------------------------------------------------  RADIOLOGY:  No results found.   ASSESSMENT AND PLAN:   * Acute on chronic resp failure due to COPD exacerbation and Bibasilar pneumonia On  cefepime. MRSA PCR negative.Will give total 7 days of antibiotic therapy. IV steroids . Nebs Scheduled Was on HFNC oxygen, now on nasal canula . Appreciate pulmonary input. I wanted to do CT angio chest to r/o PE, ordered on admisison and today again, but pt said- since many years- he feels very SOB and like to be in recliner only. Today again he insisted- he can not stay supine on CT  scan table for few minutes for scan, cancelled imaging as he is clinically improving.  He follows with pulmonologist in office.  * Atrial fibrillation Cardizem Improved HR, converted to normal sinus rhythm. Received 1 dose digoxin yesterday Appreciate cardiology input  Also on Bystolic.  Cardiovascular insisted to continue on heparin IV drip for now and convert to oral agent once renal function improves.  * AKi over CKD3 likely due to lasix Consult nephrology.  worsening renal func.  * Hyperkalemia due to acute kidney injury. Given one dose kayexalate, improved some.   Now started on patiromer.  * HTN Cardizem, bystolic  * DVT prophylaxis Lovenox  All the records are reviewed and case discussed with Care Management/Social Worker Management plans discussed with the patient, family and they are in agreement.  CODE STATUS: FULL CODE  DVT Prophylaxis: SCDs  TOTAL TIME TAKING CARE OF THIS PATIENT: 35 minutes.   POSSIBLE D/C IN 1-2 DAYS, DEPENDING ON CLINICAL CONDITION. Waiting for improvement in renal function before discharge.  Altamese DillingVACHHANI, Terianne Thaker M.D on 03/22/2017 at 4:04 PM  Between 7am to 6pm - Pager - [phone]  After 6pm go to [email or web address] - password [email or web address] [facility]  [facility]  Office  [phone]  CC: Primary care physician; Sherrie MustacheJadali, Fayegh  Note: This dictation was prepared with Dragon dictation along with smaller phrase technology. Any transcriptional errors that result from this process are unintentional.

## 2017-03-22 NOTE — Evaluation (Signed)
Physical Therapy Evaluation Patient Details Name: [name] MRN: [id number] DOB: 10-Aug-1951 Today's Date: 03/22/2017   History of Present Illness  Pt admitted for healthcare acquired pnemonia. Pt with complaints of cough and SOB. History includes COPD, MI, and CABG. Pt now on 6L of O2, reports this is baseline.   Clinical Impression  Pt is a pleasant 66 year old male who was admitted for healthcare acquired pneumonia. Pt performs transfers with cga and BRW and ambulation with BRW in room. Pt demonstrates deficits with strength/mobility/endurance. Pt is limited in mobility secondary to exertional SOB symptoms with O2 sats decreasing to 78%. With seated rest break, able to improve to WNL. All mobility performed on 6L of O2. Pt appears to be close to baseline level and reports minimal ambulation at home, uses WC for community distance. Is very happy with HHPT services, requests same therapist from [facility] to continue services. Would benefit from skilled PT to address above deficits and promote optimal return to PLOF. Recommend transition to HHPT upon discharge from acute hospitalization.       Follow Up Recommendations Home health PT;Supervision for mobility/OOB    Equipment Recommendations  None recommended by PT    Recommendations for Other Services       Precautions / Restrictions Precautions Precautions: Fall Restrictions Weight Bearing Restrictions: No      Mobility  Bed Mobility               General bed mobility comments: Deferred d/t pt sitting up in chair upon arrival and pt reports he does not sleep in bed at home.  Transfers Overall transfer level: Needs assistance Equipment used: Rolling walker (2 wheeled) Transfers: Sit to/from Stand Sit to Stand: Min guard         General transfer comment: safe technique performed by keeping L foot under body with R LE propped out. He is able to walk his hands on recliner forward to get weight over BOS. Then able to  power to stand with little assist from therapist. Used RW for safe technique.   Ambulation/Gait Ambulation/Gait assistance: Min guard Ambulation Distance (Feet): 30 Feet Assistive device: Rolling walker (2 wheeled) Gait Pattern/deviations: Step-through pattern     General Gait Details: Forward flexed posture during ambulation noted. O2 used for all mobility with sats decreased to 78% with exertion. Needs cues for safety/line management as he is slightly impulsive with mobility. Further ambulation deferred at this time  Stairs            Wheelchair Mobility    Modified Rankin (Stroke Patients Only)       Balance Overall balance assessment: Needs assistance Sitting-balance support: Feet supported Sitting balance-Leahy Scale: Good Sitting balance - Comments: tends to leans towards L side in sitting. Able to self correct with verbal/tactile cues   Standing balance support: Bilateral upper extremity supported Standing balance-Leahy Scale: Good                               Pertinent Vitals/Pain Pain Assessment: No/denies pain    Home Living Family/patient expects to be discharged to:: Private residence Living Arrangements: Spouse/significant other Available Help at Discharge: Family Type of Home: House Home Access: Ramped entrance     Home Layout: One level Home Equipment: Environmental consultantWalker - 2 wheels;Walker - 4 wheels;Other (comment);Wheelchair - manual      Prior Function Level of Independence: Independent with assistive device(s);Needs assistance   Gait / Transfers Assistance  Needed: Walks household distances with 2ww.  Sleeps in recliner at home that is a "lift" chair that helps him stand (pt reports he can also get out of w/c he has at home).     Comments: Reports he has been using 6L of O2 recently, no falls. Has been receiving HHPT through [facility] at home. Wife assist with ADLs.     Hand Dominance        Extremity/Trunk Assessment   Upper Extremity  Assessment Upper Extremity Assessment: Overall WFL for tasks assessed    Lower Extremity Assessment Lower Extremity Assessment: Generalized weakness (R LE grossly 3/5; L LE grossly 4/5)       Communication   Communication: No difficulties  Cognition Arousal/Alertness: Awake/alert Behavior During Therapy: WFL for tasks assessed/performed Overall Cognitive Status: Within Functional Limits for tasks assessed                                        General Comments General comments (skin integrity, edema, etc.): increased time for attention to pulmonary issues and cues for pursed lip breathing. O2 sats decrease quickly with exertion, takes increased time for sats to improve to 89%. Cues and technique given for improved posture control.    Exercises     Assessment/Plan    PT Assessment Patient needs continued PT services  PT Problem List Decreased strength;Decreased activity tolerance;Decreased mobility;Cardiopulmonary status limiting activity       PT Treatment Interventions DME instruction;Gait training;Functional mobility training;Therapeutic activities;Therapeutic exercise;Balance training;Patient/family education    PT Goals (Current goals can be found in the Care Plan section)  Acute Rehab PT Goals Patient Stated Goal: to go home PT Goal Formulation: With patient Time For Goal Achievement: 04/05/17 Potential to Achieve Goals: Fair    Frequency Min 2X/week   Barriers to discharge        Co-evaluation               AM-PAC PT "6 Clicks" Daily Activity  Outcome Measure Difficulty turning over in bed (including adjusting bedclothes, sheets and blankets)?: A Little Difficulty moving from lying on back to sitting on the side of the bed? : Total Difficulty sitting down on and standing up from a chair with arms (e.g., wheelchair, bedside commode, etc,.)?: A Little Help needed moving to and from a bed to chair (including a wheelchair)?: None Help  needed walking in hospital room?: None Help needed climbing 3-5 steps with a railing? : A Lot 6 Click Score: 17    End of Session Equipment Utilized During Treatment: Gait belt;Oxygen Activity Tolerance: Patient limited by fatigue Patient left: in chair;with call bell/phone within reach (no chair alarm in room; already in chair) Nurse Communication: Mobility status PT Visit Diagnosis: Muscle weakness (generalized) (M62.81);Difficulty in walking, not elsewhere classified (R26.2)    Time: 1610-9604 PT Time Calculation (min) (ACUTE ONLY): 19 min   Charges:   PT Evaluation $PT Eval Low Complexity: 1 Procedure PT Treatments $Therapeutic Activity: 8-22 mins   PT G Codes:        Elizabeth Palau, PT, DPT [phone]   Jenia Klepper 03/22/2017, 11:16 AM

## 2017-03-22 NOTE — Progress Notes (Signed)
    Patient converted to sinus rhythm. Continue heparin for anticoagulation until we see improvement in his renal function. Defer workup of underlying issues to primary.

## 2017-03-22 NOTE — Progress Notes (Signed)
ANTICOAGULATION CONSULT NOTE - Initial Consult  Pharmacy Consult for Heparin  Indication: atrial fibrillation   Allergies  Allergen Reactions  . Metformin And Related Other (See Comments)    "shakes" per pt  . Morphine And Related Other (See Comments)    Hallucinations, sweats    Patient Measurements: Height: 5\' 8"  (172.7 cm) Weight: (!) 348 lb (157.9 kg) IBW/kg (Calculated) : 68.4 Heparin Dosing Weight: 107  Vital Signs: Temp: 97.8 F (36.6 C) (05/15 0438) BP: 123/65 (05/15 0735) Pulse Rate: 74 (05/15 0735)  Labs:  Recent Labs  03/20/17 0422 03/21/17 0444 03/21/17 1044 03/21/17 2017 03/22/17 0342 03/22/17 1027  HGB  --   --  12.4*  --   --  12.2*  HCT  --   --  39.2*  --   --  38.2*  PLT  --   --  152  --   --  145*  APTT  --   --  142*  --   --   --   LABPROT  --   --  14.1  --   --   --   INR  --   --  1.09  --   --   --   HEPARINUNFRC  --   --   --  0.72* 0.62 0.44  CREATININE 2.17* 2.71*  --   --  2.82*  --     Estimated Creatinine Clearance: 38.5 mL/min (A) (by C-G formula based on SCr of 2.82 mg/dL (H)).   Medical History: Past Medical History:  Diagnosis Date  . Asthma   . COPD (chronic obstructive pulmonary disease) (HCC)   . Heart attack (HCC)   . High blood pressure   . High cholesterol   . Scoliosis   . Seasonal allergies   . Sleep apnea     Medications:  Prescriptions Prior to Admission  Medication Sig Dispense Refill Last Dose  . aspirin EC 81 MG tablet Take 81 mg by mouth daily.   03/18/2017 at Unknown time  . cetirizine (ZYRTEC) 10 MG tablet Take 10 mg by mouth daily.   03/18/2017 at Unknown time  . clopidogrel (PLAVIX) 75 MG tablet Take 75 mg by mouth daily.   03/18/2017 at Unknown time  . ferrous sulfate 325 (65 FE) MG EC tablet Take 325 mg by mouth daily with breakfast.   03/18/2017 at Unknown time  . furosemide (LASIX) 40 MG tablet Take 40 mg by mouth 2 (two) times daily.    03/18/2017 at Unknown time  . glipiZIDE (GLUCOTROL) 10 MG  tablet Take 10 mg by mouth 2 (two) times daily before a meal.   03/18/2017 at Unknown time  . Nebivolol HCl 20 MG TABS Take 10-40 mg by mouth 2 (two) times daily. 40mg  in the morning and 10mg  at bedtime   03/18/2017 at Unknown time  . omeprazole (PRILOSEC) 20 MG capsule Take 20 mg by mouth daily.   03/18/2017 at Unknown time  . OXYGEN Inhale 2 L into the lungs continuous.   03/18/2017 at Unknown time  . polyethylene glycol powder (GLYCOLAX/MIRALAX) powder Dissolve one heaping tablespoon in 4-8 ounces of juice or water and drink once daily. (Patient taking differently: Take 17 g by mouth daily as needed for mild constipation. ) 255 g 0 prn at prn  . pregabalin (LYRICA) 50 MG capsule Take 50 mg by mouth 2 (two) times daily.   03/18/2017 at Unknown time  . rosuvastatin (CRESTOR) 10 MG tablet Take 10 mg by mouth  daily. Reported on 04/22/2016   03/18/2017 at Unknown time  . tamsulosin (FLOMAX) 0.4 MG CAPS capsule Take 0.4 mg by mouth daily.   03/18/2017 at Unknown time  . telmisartan (MICARDIS) 80 MG tablet Take 80 mg by mouth daily.   03/18/2017 at Unknown time  . Vitamin D, Ergocalciferol, (DRISDOL) 50000 units CAPS capsule Take 50,000 Units by mouth every 7 (seven) days. Patient takes on Monday   Past Week at Unknown time   Scheduled:  . ferrous sulfate  325 mg Oral Q breakfast  . fluticasone  1 spray Each Nare BID  . insulin aspart  0-9 Units Subcutaneous TID WC  . ipratropium-albuterol  3 mL Nebulization TID  . loratadine  10 mg Oral Daily  . mouth rinse  15 mL Mouth Rinse BID  . methylPREDNISolone (SOLU-MEDROL) injection  60 mg Intravenous Q12H  . metoprolol tartrate  12.5 mg Oral Q6H  . pantoprazole  40 mg Oral Daily  . pregabalin  50 mg Oral BID  . rosuvastatin  10 mg Oral Daily  . tamsulosin  0.4 mg Oral Daily    Assessment: Pharmacy consulted to dose and monitor heparin therapy in this 66 year old male being treated for atrial fibrillation  Goal of Therapy:  Heparin level 0.3-0.7  units/ml Monitor platelets by anticoagulation protocol: Yes   Plan:  Give 5000 units bolus x 1 Start heparin infusion at 1500 units/hr Check anti-Xa level in 8 hours and daily while on heparin Continue to monitor H&H and platelets  Will check Heparin level @ 1730 since patient is in AKI and CrCl can't be accurately measured to determine renal function in this patient.   5/14:  HL @ 20:17 = 0.72.  Will decrease drip rate to 1400 units/hr and recheck HL 6 hrs after rate change.   05/15 03:30 heparin level 0.62. Continue current regimen. Recheck in 6 hours to confirm. 5/15: Heparin level again within goal range. Will recheck in am.    Maxwell Martorano D 03/22/2017,11:41 AM

## 2017-03-22 NOTE — Progress Notes (Signed)
Inpatient Diabetes Program Recommendations  AACE/ADA: New Consensus Statement on Inpatient Glycemic Control (2015)  Target Ranges:  Prepandial:   less than 140 mg/dL      Peak postprandial:   less than 180 mg/dL (1-2 hours)      Critically ill patients:  140 - 180 mg/dL   Lab Results  Component Value Date   GLUCAP 219 (H) 03/22/2017   HGBA1C 6.6 (H) 03/20/2017    Review of Glycemic Control  Results for [name], Vicent R (MRN [id number]) as of 03/22/2017 11:26  Ref. Range 03/21/2017 07:36 03/21/2017 11:57 03/21/2017 16:43 03/21/2017 20:28 03/22/2017 07:54  Glucose-Capillary Latest Ref Range: 65 - 99 mg/dL 409251 (H) 811238 (H) 914231 (H) 205 (H) 219 (H)   Diabetes history: Type 2 diabetes Outpatient Diabetes medications: Glucotrol 10 mg bid Current orders for Inpatient glycemic control: Novolog sensitive tid with meals, Solumedrol 60 mg IV q 12 hours  Inpatient Diabetes Program Recommendations:  Please consider increasing Novolog correction to moderate while patient is on steroids. Consider adding low dose basal insulin, Lantus 16 units qhs (0.1unit/kg)- fasting blood sugar 219 mg/dl   Susette RacerJulie Arish Redner, RN, [place], [place]HA, CDE Diabetes Coordinator Inpatient Diabetes Program  [phone] (Team Pager) [phone] [facility] Office) 03/22/2017 11:41 AM

## 2017-03-22 NOTE — Progress Notes (Signed)
[facility] [place], [place] 03/22/17  Subjective:  Renal function slightly worse today. BUN up to 69 with a creatinine of 2.82. Patient was started on IV fluid hydration. Resting comfortably in chair.   Objective:  Vital signs in last 24 hours:  Temp:  [97.7 F (36.5 C)-98.5 F (36.9 C)] 97.8 F (36.6 C) (05/15 1146) Pulse Rate:  [60-84] 73 (05/15 1146) Resp:  [16-20] 20 (05/15 1146) BP: (91-135)/(63-113) 124/66 (05/15 1146) SpO2:  [92 %-100 %] 100 % (05/15 1146) FiO2 (%):  [44 %] 44 % (05/15 0849)  Weight change:  Filed Weights   03/18/17 1332  Weight: (!) 157.9 kg (348 lb)    Intake/Output:    Intake/Output Summary (Last 24 hours) at 03/22/17 1209 Last data filed at 03/22/17 1019  Gross per 24 hour  Intake          1757.15 ml  Output              770 ml  Net           987.15 ml     Physical Exam: General: NAD, sitting up in chair  HEENT anicteric  Neck supple  Pulm/lungs Normal effort, basilar rales  CVS/Heart S1S2 no rubs irregular  Abdomen:  Obese, non tender  Extremities: + dependent edema  Neurologic: Alert, oriented, follows commands  Skin: No acute rashes          Basic Metabolic Panel:   Recent Labs Lab 03/18/17 1342 03/19/17 0330 03/20/17 0422 03/21/17 0444 03/22/17 0342  NA 140 141 135 134* 135  K 4.7 4.2 5.9* 5.6* 5.2*  CL 97* 98* 97* 94* 94*  CO2 34* 35* 32 32 33*  GLUCOSE 142* 146* 250* 283* 284*  BUN 36* 34* 43* 63* 69*  CREATININE 1.61* 1.53* 2.17* 2.71* 2.82*  CALCIUM 9.0 8.8* 8.6* 8.1* 8.2*     CBC:  Recent Labs Lab 03/18/17 1342 03/19/17 0330 03/21/17 1044 03/22/17 1027  WBC 6.9 6.6 11.2* 8.8  NEUTROABS 4.8  --   --   --   HGB 12.8* 13.1 12.4* 12.2*  HCT 40.1 40.5 39.2* 38.2*  MCV 90.5 90.5 90.5 89.8  PLT 125* 124* 152 145*     No results found for: HEPBSAG, HEPBSAB, HEPBIGM    Microbiology:  Recent Results (from the past 240 hour(s))  Blood culture (routine x 2)     Status: None  (Preliminary result)   Collection Time: 03/18/17  6:13 PM  Result Value Ref Range Status   Specimen Description BLOOD  R AC  Final   Special Requests BOTTLES DRAWN AEROBIC AND ANAEROBIC BCAV  Final   Culture NO GROWTH 4 DAYS  Final   Report Status PENDING  Incomplete  Blood culture (routine x 2)     Status: None (Preliminary result)   Collection Time: 03/18/17  6:13 PM  Result Value Ref Range Status   Specimen Description BLOOD  L FOREARM  Final   Special Requests BOTTLES DRAWN AEROBIC AND ANAEROBIC BCHV  Final   Culture NO GROWTH 4 DAYS  Final   Report Status PENDING  Incomplete    Coagulation Studies:  Recent Labs  03/21/17 1044  LABPROT 14.1  INR 1.09    Urinalysis: No results for input(s): COLORURINE, LABSPEC, PHURINE, GLUCOSEU, HGBUR, BILIRUBINUR, KETONESUR, PROTEINUR, UROBILINOGEN, NITRITE, LEUKOCYTESUR in the last 72 hours.  Invalid input(s): APPERANCEUR    Imaging: No results found.   Medications:   . sodium chloride 50 mL/hr at 03/22/17 1019  .  ceFEPime (MAXIPIME) IV Stopped (03/22/17 0617)  . heparin 1,400 Units/hr (03/22/17 0000)   . ferrous sulfate  325 mg Oral Q breakfast  . fluticasone  1 spray Each Nare BID  . insulin aspart  0-9 Units Subcutaneous TID WC  . ipratropium-albuterol  3 mL Nebulization TID  . loratadine  10 mg Oral Daily  . mouth rinse  15 mL Mouth Rinse BID  . methylPREDNISolone (SOLU-MEDROL) injection  60 mg Intravenous Q12H  . metoprolol tartrate  12.5 mg Oral Q6H  . pantoprazole  40 mg Oral Daily  . pregabalin  50 mg Oral BID  . rosuvastatin  10 mg Oral Daily  . tamsulosin  0.4 mg Oral Daily   docusate sodium, guaiFENesin-dextromethorphan, ipratropium-albuterol, metoprolol tartrate, ondansetron, traMADol  Assessment/ Plan:  66 y.o. male with medical problems of morbid obesity, chronic respiratory failure, chronic congestive heart failure, sleep apnea, type 2 diabetes for over 10 years, history of MI, history of kidney stone,  atrial fibrillation February 2017 admitted for pneumonia  1.  ARF - likely ATN from hypotension and pneumonia Baseline Cr 1.02 from 03/04/2017 -  Renal function about the same, declined renal US x 2, will request again to make sure theres no underlying hydronephrosis.   Continue gentle IVF hydration for now.   2. LE edema - Possibly combination of lymphedema and 3rd spacing -  Lasix held as renal function was worsening.   3. Hyperkalemia - K 5.2, continue patiromer.   4. Bacterial Pneumonia Continue cefepime.      LOS: 4 Oziel Beitler 5/15/201812:09 PM  [facility] [place], [place] [phone]

## 2017-03-23 LAB — CBC
HEMATOCRIT: 39.4 % — AB (ref 40.0–52.0)
HEMOGLOBIN: 12.7 g/dL — AB (ref 13.0–18.0)
MCH: 29.3 pg (ref 26.0–34.0)
MCHC: 32.1 g/dL (ref 32.0–36.0)
MCV: 91.2 fL (ref 80.0–100.0)
Platelets: 132 10*3/uL — ABNORMAL LOW (ref 150–440)
RBC: 4.32 MIL/uL — ABNORMAL LOW (ref 4.40–5.90)
RDW: 14.7 % — ABNORMAL HIGH (ref 11.5–14.5)
WBC: 6.4 10*3/uL (ref 3.8–10.6)

## 2017-03-23 LAB — BASIC METABOLIC PANEL
Anion gap: 6 (ref 5–15)
BUN: 77 mg/dL — ABNORMAL HIGH (ref 6–20)
CO2: 32 mmol/L (ref 22–32)
Calcium: 8.3 mg/dL — ABNORMAL LOW (ref 8.9–10.3)
Chloride: 98 mmol/L — ABNORMAL LOW (ref 101–111)
Creatinine, Ser: 2.5 mg/dL — ABNORMAL HIGH (ref 0.61–1.24)
GFR calc Af Amer: 29 mL/min — ABNORMAL LOW (ref >60)
GFR calc non Af Amer: 25 mL/min — ABNORMAL LOW (ref >60)
Glucose, Bld: 281 mg/dL — ABNORMAL HIGH (ref 65–99)
Potassium: 5.4 mmol/L — ABNORMAL HIGH (ref 3.5–5.1)
Sodium: 136 mmol/L (ref 135–145)

## 2017-03-23 LAB — MPO/PR-3 (ANCA) ANTIBODIES
ANCA Proteinase 3: <3.5 U/mL (ref 0.0–3.5)
Myeloperoxidase Abs: <9 U/mL (ref 0.0–9.0)

## 2017-03-23 LAB — CULTURE, BLOOD (ROUTINE X 2)
Culture: NO GROWTH
Culture: NO GROWTH

## 2017-03-23 LAB — GLUCOSE, CAPILLARY
GLUCOSE-CAPILLARY: 248 mg/dL — AB (ref 65–99)
GLUCOSE-CAPILLARY: 383 mg/dL — AB (ref 65–99)
Glucose-Capillary: 281 mg/dL — ABNORMAL HIGH (ref 65–99)
Glucose-Capillary: 397 mg/dL — ABNORMAL HIGH (ref 65–99)

## 2017-03-23 LAB — GLOMERULAR BASEMENT MEMBRANE ANTIBODIES: GBM Ab: 6 units (ref 0–20)

## 2017-03-23 LAB — ANA W/REFLEX IF POSITIVE: Anti Nuclear Antibody(ANA): NEGATIVE

## 2017-03-23 LAB — HEPARIN LEVEL (UNFRACTIONATED): Heparin Unfractionated: 0.41 IU/mL (ref 0.30–0.70)

## 2017-03-23 MED ORDER — INSULIN ASPART 100 UNIT/ML ~~LOC~~ SOLN
0.0000 [IU] | Freq: Three times a day (TID) | SUBCUTANEOUS | Status: DC
  Administered 2017-03-23: 15 [IU] via SUBCUTANEOUS
  Administered 2017-03-24: 3 [IU] via SUBCUTANEOUS
  Administered 2017-03-24: 11 [IU] via SUBCUTANEOUS
  Administered 2017-03-24: 3 [IU] via SUBCUTANEOUS
  Administered 2017-03-24: 5 [IU] via SUBCUTANEOUS
  Administered 2017-03-25: 15 [IU] via SUBCUTANEOUS
  Administered 2017-03-25: 3 [IU] via SUBCUTANEOUS
  Administered 2017-03-25: 8 [IU] via SUBCUTANEOUS
  Administered 2017-03-25 – 2017-03-26 (×2): 3 [IU] via SUBCUTANEOUS
  Administered 2017-03-26: 2 [IU] via SUBCUTANEOUS
  Filled 2017-03-23: qty 3
  Filled 2017-03-23: qty 8
  Filled 2017-03-23 (×2): qty 3
  Filled 2017-03-23: qty 5
  Filled 2017-03-23: qty 2
  Filled 2017-03-23 (×2): qty 15
  Filled 2017-03-23: qty 3
  Filled 2017-03-23: qty 11
  Filled 2017-03-23: qty 3

## 2017-03-23 MED ORDER — PREDNISONE 50 MG PO TABS
50.0000 mg | ORAL_TABLET | Freq: Every day | ORAL | Status: DC
  Administered 2017-03-24 – 2017-03-26 (×3): 50 mg via ORAL
  Filled 2017-03-23 (×3): qty 1

## 2017-03-23 MED ORDER — PATIROMER SORBITEX CALCIUM 8.4 G PO PACK
16.8000 g | PACK | Freq: Every day | ORAL | Status: DC
  Administered 2017-03-24: 16.8 g via ORAL
  Filled 2017-03-23 (×2): qty 4

## 2017-03-23 MED ORDER — HYDROMORPHONE HCL 1 MG/ML IJ SOLN
0.5000 mg | Freq: Once | INTRAMUSCULAR | Status: AC
  Administered 2017-03-23: 0.5 mg via INTRAVENOUS
  Filled 2017-03-23: qty 1

## 2017-03-23 NOTE — Progress Notes (Signed)
Inpatient Diabetes Program Recommendations  AACE/ADA: New Consensus Statement on Inpatient Glycemic Control (2015)  Target Ranges:  Prepandial:   less than 140 mg/dL      Peak postprandial:   less than 180 mg/dL (1-2 hours)      Critically ill patients:  140 - 180 mg/dL   Results for [name], [name] (MRN [id number]) as of 03/23/2017 12:13  Ref. Range 03/22/2017 07:54 03/22/2017 11:48 03/22/2017 16:56 03/22/2017 21:07 03/23/2017 07:33 03/23/2017 11:51  Glucose-Capillary Latest Ref Range: 65 - 99 mg/dL 829219 (H) 562269 (H) 130301 (H) 188 (H) 281 (H) 248 (H)   Review of Glycemic Control  Diabetes history: DM2 Outpatient Diabetes medications: Glipizide 10 mg BID Current orders for Inpatient glycemic control: Lantus 16 units QHS, Novolog 0-15 units TID with meals  Inpatient Diabetes Program Recommendations:  Correction (SSI): Please consider ordering Novolog 0-5 units QHS for bedtime correction scale. Insulin - Meal Coverage: Noted Solumedrol decreased to Prednisone 50 mg QAM today. While ordered steroids, please consider ordering Novolog 4 units TID with meals for meal coverage.  Thanks, Orlando PennerMarie Fitzroy Mikami, RN, MSN, CDE Diabetes Coordinator Inpatient Diabetes Program [phone] (Team Pager from 8am to 5pm)

## 2017-03-23 NOTE — Progress Notes (Signed)
[facility] [place], [place] 03/23/17  Subjective:  BUN higher today at 77 however creatinine slightly lower at 2.5. Urine output was 1.6 L over the preceding 24 hours. Patient could not have renal ultrasound performed as he cannot lay flat.   Objective:  Vital signs in last 24 hours:  Temp:  [97.4 F (36.3 C)-98.1 F (36.7 C)] 98.1 F (36.7 C) (05/16 1152) Pulse Rate:  [72-86] 72 (05/16 1152) Resp:  [18-20] 20 (05/16 1152) BP: (137-151)/(72-84) 137/74 (05/16 1152) SpO2:  [91 %-98 %] 91 % (05/16 1152)  Weight change:  Filed Weights   03/18/17 1332  Weight: (!) 157.9 kg (348 lb)    Intake/Output:    Intake/Output Summary (Last 24 hours) at 03/23/17 1318 Last data filed at 03/23/17 1001  Gross per 24 hour  Intake              360 ml  Output             1520 ml  Net            -1160 ml     Physical Exam: General: NAD, sitting up in chair  HEENT anicteric  Neck supple  Pulm/lungs Normal effort, basilar rales  CVS/Heart S1S2 no rubs irregular  Abdomen:  Obese, non tender  Extremities: + dependent edema  Neurologic: Alert, oriented, follows commands  Skin: No acute rashes          Basic Metabolic Panel:   Recent Labs Lab 03/19/17 0330 03/20/17 0422 03/21/17 0444 03/22/17 0342 03/23/17 0515  NA 141 135 134* 135 136  K 4.2 5.9* 5.6* 5.2* 5.4*  CL 98* 97* 94* 94* 98*  CO2 35* 32 32 33* 32  GLUCOSE 146* 250* 283* 284* 281*  BUN 34* 43* 63* 69* 77*  CREATININE 1.53* 2.17* 2.71* 2.82* 2.50*  CALCIUM 8.8* 8.6* 8.1* 8.2* 8.3*     CBC:  Recent Labs Lab 03/18/17 1342 03/19/17 0330 03/21/17 1044 03/22/17 1027 03/23/17 0515  WBC 6.9 6.6 11.2* 8.8 6.4  NEUTROABS 4.8  --   --   --   --   HGB 12.8* 13.1 12.4* 12.2* 12.7*  HCT 40.1 40.5 39.2* 38.2* 39.4*  MCV 90.5 90.5 90.5 89.8 91.2  PLT 125* 124* 152 145* 132*     No results found for: HEPBSAG, HEPBSAB, HEPBIGM    Microbiology:  Recent Results (from the past 240 hour(s))   Blood culture (routine x 2)     Status: None   Collection Time: 03/18/17  6:13 PM  Result Value Ref Range Status   Specimen Description BLOOD  R AC  Final   Special Requests BOTTLES DRAWN AEROBIC AND ANAEROBIC BCAV  Final   Culture NO GROWTH 5 DAYS  Final   Report Status 03/23/2017 FINAL  Final  Blood culture (routine x 2)     Status: None   Collection Time: 03/18/17  6:13 PM  Result Value Ref Range Status   Specimen Description BLOOD  L FOREARM  Final   Special Requests BOTTLES DRAWN AEROBIC AND ANAEROBIC BCHV  Final   Culture NO GROWTH 5 DAYS  Final   Report Status 03/23/2017 FINAL  Final    Coagulation Studies:  Recent Labs  03/21/17 1044  LABPROT 14.1  INR 1.09    Urinalysis: No results for input(s): COLORURINE, LABSPEC, PHURINE, GLUCOSEU, HGBUR, BILIRUBINUR, KETONESUR, PROTEINUR, UROBILINOGEN, NITRITE, LEUKOCYTESUR in the last 72 hours.  Invalid input(s): APPERANCEUR    Imaging: No results found.   Medications:   .  sodium chloride 50 mL/hr at 03/23/17 0620  . ceFEPime (MAXIPIME) IV 1 g (03/23/17 0615)  . heparin 1,400 Units/hr (03/22/17 1916)   . ferrous sulfate  325 mg Oral Q breakfast  . fluticasone  1 spray Each Nare BID  . insulin aspart  0-15 Units Subcutaneous TID WC  . insulin glargine  16 Units Subcutaneous QHS  . ipratropium-albuterol  3 mL Nebulization TID  . loratadine  10 mg Oral Daily  . mouth rinse  15 mL Mouth Rinse BID  . metoprolol tartrate  12.5 mg Oral Q6H  . pantoprazole  40 mg Oral Daily  . patiromer  8.4 g Oral Daily  . [START ON 03/24/2017] predniSONE  50 mg Oral Q breakfast  . pregabalin  50 mg Oral BID  . rosuvastatin  10 mg Oral Daily  . tamsulosin  0.4 mg Oral Daily   docusate sodium, guaiFENesin-dextromethorphan, ipratropium-albuterol, metoprolol tartrate, ondansetron, traMADol  Assessment/ Plan:  66 y.o. male with medical problems of morbid obesity, chronic respiratory failure, chronic congestive heart failure, sleep apnea,  type 2 diabetes for over 10 years, history of MI, history of kidney stone, atrial fibrillation February 2017 admitted for pneumonia  1.  ARF - likely ATN from hypotension and pneumonia Baseline Cr 1.02 from 03/04/2017 - BUN currently 77 with a creatinine of 2.5.  Renal function remains less than baseline.  We will go ahead and stop IV fluid hydration for now. Patient cannot have a renal ultrasound as he states that he cannot lay flat for the study.  2. LE edema - Possibly combination of lymphedema and 3rd spacing -  Continue to hold Lasix..   3. Hyperkalemia - potassium slightly higher at 5.4.  Increase patiromer to 16.8 g by mouth daily.   4. Bacterial Pneumonia Continue cefepime.      LOS: 5 Laelia Angelo 5/16/20181:18 PM  [facility] [place], [place] [phone]

## 2017-03-23 NOTE — Progress Notes (Signed)
[employer] - [place] [facility]   PATIENT NAME: Ernest [name]    MR#:  [id number]  DATE OF BIRTH:  09-Nov-1950  SUBJECTIVE:  CHIEF COMPLAINT:   Chief Complaint  Patient presents with  . Cough   Had shortness of breath and cough. Was On 70% oxygen with high flow nasal cannula. Also had A fib and RVR, Heart rate improved. Converted to sinus rhythm. Now on nasal canula oxygen 5- 6 ltr/min.   Sitting in recliner without any complaints.   Renal func slightly better with leg edema.  REVIEW OF SYSTEMS:    Review of Systems  Constitutional: Positive for malaise/fatigue. Negative for chills and fever.  HENT: Negative for sore throat.   Eyes: Negative for blurred vision, double vision and pain.  Respiratory: Positive for cough and shortness of breath. Negative for hemoptysis and wheezing.   Cardiovascular: Positive for orthopnea. Negative for chest pain, palpitations and leg swelling.  Gastrointestinal: Negative for abdominal pain, constipation, diarrhea, heartburn, nausea and vomiting.  Genitourinary: Negative for dysuria and hematuria.  Musculoskeletal: Negative for back pain and joint pain.  Skin: Negative for rash.  Neurological: Positive for weakness. Negative for sensory change, speech change, focal weakness and headaches.  Endo/Heme/Allergies: Does not bruise/bleed easily.  Psychiatric/Behavioral: Negative for depression. The patient is not nervous/anxious.     DRUG ALLERGIES:   Allergies  Allergen Reactions  . Metformin And Related Other (See Comments)    "shakes" per pt  . Morphine And Related Other (See Comments)    Hallucinations, sweats    VITALS:  Blood pressure 140/82, pulse 75, temperature 97.8 F (36.6 C), temperature source Oral, resp. rate 18, height 5\' 8"  (1.727 m), weight (!) 157.9 kg (348 lb), SpO2 92 %.  PHYSICAL EXAMINATION:   Physical Exam  [name]:  66 y.o.-year-old patient sitting in a chair. Morbidly obese. Conversational  dyspnea.  EYES: Pupils equal, round, reactive to light and accommodation. No scleral icterus. Extraocular muscles intact.  HEENT: Head atraumatic, normocephalic. Oropharynx and nasopharynx clear.  NECK:  Supple, no jugular venous distention. No thyroid enlargement, no tenderness.  LUNGS: Normal breath sounds bilaterally, no wheezing, Some crepitations. No use of accessory muscles of respiration.  CARDIOVASCULAR: S1, S2. Irregular. Tachycardic ABDOMEN: Soft, nontender, nondistended. Bowel sounds present. No organomegaly or mass.  EXTREMITIES: No cyanosis, clubbing. B/L LE edema NEUROLOGIC: Cranial nerves II through XII are intact. No focal Motor or sensory deficits b/l.   PSYCHIATRIC: The patient is alert and oriented x 3.  SKIN: No obvious rash, lesion, or ulcer.   LABORATORY PANEL:   CBC  Recent Labs Lab 03/23/17 0515  WBC 6.4  HGB 12.7*  HCT 39.4*  PLT 132*   ------------------------------------------------------------------------------------------------------------------ Chemistries   Recent Labs Lab 03/23/17 0515  NA 136  K 5.4*  CL 98*  CO2 32  GLUCOSE 281*  BUN 77*  CREATININE 2.50*  CALCIUM 8.3*   ------------------------------------------------------------------------------------------------------------------  Cardiac Enzymes  Recent Labs Lab 03/18/17 1342  TROPONINI <0.03   ------------------------------------------------------------------------------------------------------------------  RADIOLOGY:  No results found.   ASSESSMENT AND PLAN:   * Acute on chronic resp failure due to COPD exacerbation and Bibasilar pneumonia- HCAP as recent admission. On  cefepime. MRSA PCR negative.Will give total 7 days of antibiotic therapy. IV steroids . Nebs Scheduled Was on HFNC oxygen, now on nasal canula . Cont to taper. 2-3 ltr is baseline. Appreciate pulmonary input. I wanted to do CT angio chest to r/o PE, ordered , but pt said- since many years- he  feels  very SOB and like to be in recliner only. again he insisted- he can not stay supine on CT scan table for few minutes for scan, cancelled imaging as he is clinically improving.  He follows with pulmonologist in office.   He is on anticoagulation for A fib anyways.  * Atrial fibrillation Cardizem Improved HR, converted to normal sinus rhythm. Received 1 dose digoxin  Appreciate cardiology input  Also on Bystolic.  Cardiovascular insisted to continue on heparin IV drip for now and convert to oral agent once renal function improves.  * AKi over CKD3 likely due to lasix Consulted nephrology. Baseline creatinine- 1, went up to 3, now 2.5.  could not do renal sono, as he can not sleep flat.  * Hyperkalemia due to acute kidney injury. Given one dose kayexalate, improved some.   Now started on patiromer by nephro.  * HTN Cardizem, bystolic  * DVT prophylaxis Heparin iv drip.  All the records are reviewed and case discussed with Care Management/Social Worker Management plans discussed with the patient, family and they are in agreement.  CODE STATUS: FULL CODE  DVT Prophylaxis: SCDs  TOTAL TIME TAKING CARE OF THIS PATIENT: 35 minutes.   POSSIBLE D/C IN 1-2 DAYS, DEPENDING ON CLINICAL CONDITION. Waiting for improvement in renal function before discharge.  Altamese DillingVACHHANI, Mostafa Yuan M.D on 03/23/2017 at 9:31 PM  Between 7am to 6pm - Pager - [phone]  After 6pm go to [email or web address] - password [email or web address] [facility]  [facility]  Office  [phone]  CC: Primary care physician; Sherrie MustacheJadali, Fayegh  Note: This dictation was prepared with Dragon dictation along with smaller phrase technology. Any transcriptional errors that result from this process are unintentional.

## 2017-03-23 NOTE — Progress Notes (Signed)
ANTICOAGULATION CONSULT NOTE - Initial Consult  Pharmacy Consult for Heparin  Indication: atrial fibrillation   Allergies  Allergen Reactions  . Metformin And Related Other (See Comments)    "shakes" per pt  . Morphine And Related Other (See Comments)    Hallucinations, sweats    Patient Measurements: Height: 5\' 8"  (172.7 cm) Weight: (!) 348 lb (157.9 kg) IBW/kg (Calculated) : 68.4 Heparin Dosing Weight: 107  Vital Signs: Temp: 97.4 F (36.3 C) (05/15 1959) Temp Source: Oral (05/15 1959) BP: 139/81 (05/15 1959) Pulse Rate: 72 (05/15 1959)  Labs:  Recent Labs  03/21/17 0444  03/21/17 1044 03/21/17 2017 03/22/17 0342 03/22/17 1027 03/23/17 0515  HGB  --   < > 12.4*  --   --  12.2* 12.7*  HCT  --   --  39.2*  --   --  38.2* 39.4*  PLT  --   --  152  --   --  145* 132*  APTT  --   --  142*  --   --   --   --   LABPROT  --   --  14.1  --   --   --   --   INR  --   --  1.09  --   --   --   --   HEPARINUNFRC  --   --   --  0.72* 0.62 0.44  --   CREATININE 2.71*  --   --   --  2.82*  --   --   < > = values in this interval not displayed.  Estimated Creatinine Clearance: 38.5 mL/min (A) (by C-G formula based on SCr of 2.82 mg/dL (H)).   Medical History: Past Medical History:  Diagnosis Date  . Asthma   . COPD (chronic obstructive pulmonary disease) (HCC)   . Heart attack (HCC)   . High blood pressure   . High cholesterol   . Scoliosis   . Seasonal allergies   . Sleep apnea     Medications:  Prescriptions Prior to Admission  Medication Sig Dispense Refill Last Dose  . aspirin EC 81 MG tablet Take 81 mg by mouth daily.   03/18/2017 at Unknown time  . cetirizine (ZYRTEC) 10 MG tablet Take 10 mg by mouth daily.   03/18/2017 at Unknown time  . clopidogrel (PLAVIX) 75 MG tablet Take 75 mg by mouth daily.   03/18/2017 at Unknown time  . ferrous sulfate 325 (65 FE) MG EC tablet Take 325 mg by mouth daily with breakfast.   03/18/2017 at Unknown time  . furosemide  (LASIX) 40 MG tablet Take 40 mg by mouth 2 (two) times daily.    03/18/2017 at Unknown time  . glipiZIDE (GLUCOTROL) 10 MG tablet Take 10 mg by mouth 2 (two) times daily before a meal.   03/18/2017 at Unknown time  . Nebivolol HCl 20 MG TABS Take 10-40 mg by mouth 2 (two) times daily. 40mg  in the morning and 10mg  at bedtime   03/18/2017 at Unknown time  . omeprazole (PRILOSEC) 20 MG capsule Take 20 mg by mouth daily.   03/18/2017 at Unknown time  . OXYGEN Inhale 2 L into the lungs continuous.   03/18/2017 at Unknown time  . polyethylene glycol powder (GLYCOLAX/MIRALAX) powder Dissolve one heaping tablespoon in 4-8 ounces of juice or water and drink once daily. (Patient taking differently: Take 17 g by mouth daily as needed for mild constipation. ) 255 g 0 prn at prn  .  pregabalin (LYRICA) 50 MG capsule Take 50 mg by mouth 2 (two) times daily.   03/18/2017 at Unknown time  . rosuvastatin (CRESTOR) 10 MG tablet Take 10 mg by mouth daily. Reported on 04/22/2016   03/18/2017 at Unknown time  . tamsulosin (FLOMAX) 0.4 MG CAPS capsule Take 0.4 mg by mouth daily.   03/18/2017 at Unknown time  . telmisartan (MICARDIS) 80 MG tablet Take 80 mg by mouth daily.   03/18/2017 at Unknown time  . Vitamin D, Ergocalciferol, (DRISDOL) 50000 units CAPS capsule Take 50,000 Units by mouth every 7 (seven) days. Patient takes on Monday   Past Week at Unknown time   Scheduled:  . ferrous sulfate  325 mg Oral Q breakfast  . fluticasone  1 spray Each Nare BID  . insulin aspart  0-15 Units Subcutaneous TID WC  . insulin glargine  16 Units Subcutaneous QHS  . ipratropium-albuterol  3 mL Nebulization TID  . loratadine  10 mg Oral Daily  . mouth rinse  15 mL Mouth Rinse BID  . methylPREDNISolone (SOLU-MEDROL) injection  60 mg Intravenous Q12H  . metoprolol tartrate  12.5 mg Oral Q6H  . pantoprazole  40 mg Oral Daily  . patiromer  8.4 g Oral Daily  . pregabalin  50 mg Oral BID  . rosuvastatin  10 mg Oral Daily  . tamsulosin  0.4  mg Oral Daily    Assessment: Pharmacy consulted to dose and monitor heparin therapy in this 66 year old male being treated for atrial fibrillation  Goal of Therapy:  Heparin level 0.3-0.7 units/ml Monitor platelets by anticoagulation protocol: Yes   Plan:  Give 5000 units bolus x 1 Start heparin infusion at 1500 units/hr Check anti-Xa level in 8 hours and daily while on heparin Continue to monitor H&H and platelets  Will check Heparin level @ 1730 since patient is in AKI and CrCl can't be accurately measured to determine renal function in this patient.   5/14:  HL @ 20:17 = 0.72.  Will decrease drip rate to 1400 units/hr and recheck HL 6 hrs after rate change.   05/15 03:30 heparin level 0.62. Continue current regimen. Recheck in 6 hours to confirm. 5/15 Heparin level again within goal range. Will recheck in am.   5/16 AM heparin level 0.44. Continue current regimen. Recheck heparin level and CBC with tomorrow AM labs.   Vernor Monnig S 03/23/2017,5:40 AM

## 2017-03-23 NOTE — Consult Note (Signed)
   [facility] CM Inpatient Consult   03/23/2017  [name] [birth year] [id number]  Chart review revealed patient eligible for [place] Management services and post hospital discharge follow up related to a diagnosis of COPD, Diabetes and CKD. Patient was evaluated for community based chronic disease management services with [facility] care Management Program as a benefit of patient's [employer]. Met with the patient and his spouse Stanton Kidney at the bedside to explain [place] Management services. Consent form signed. Patient gave [phone] his spouses number, as the best number to reach him. He also gave written permission to talk with his spouse if he can not be reached.Patient will receive post hospital discharge calls and be evaluated for monthly home visits. [facility] Care Management services does not interfere with or replace any services arranged by the inpatient care management team. RNCM left contact information and [facility] literature at the bedside. Made inpatient RNCM aware that [facility] will be following for care management. For additional questions please contact:   Addisynn Vassell RN, [place] [facility] Liaison  [phone]) Business Mobile [phone]) Toll free office

## 2017-03-24 LAB — GLUCOSE, CAPILLARY
GLUCOSE-CAPILLARY: 192 mg/dL — AB (ref 65–99)
GLUCOSE-CAPILLARY: 321 mg/dL — AB (ref 65–99)
Glucose-Capillary: 185 mg/dL — ABNORMAL HIGH (ref 65–99)
Glucose-Capillary: 241 mg/dL — ABNORMAL HIGH (ref 65–99)

## 2017-03-24 LAB — BASIC METABOLIC PANEL
Anion gap: 7 (ref 5–15)
BUN: 74 mg/dL — ABNORMAL HIGH (ref 6–20)
CHLORIDE: 101 mmol/L (ref 101–111)
CO2: 33 mmol/L — ABNORMAL HIGH (ref 22–32)
Calcium: 8.6 mg/dL — ABNORMAL LOW (ref 8.9–10.3)
Creatinine, Ser: 1.87 mg/dL — ABNORMAL HIGH (ref 0.61–1.24)
GFR calc non Af Amer: 36 mL/min — ABNORMAL LOW (ref >60)
GFR, EST AFRICAN AMERICAN: 42 mL/min — AB (ref >60)
Glucose, Bld: 266 mg/dL — ABNORMAL HIGH (ref 65–99)
Potassium: 5 mmol/L (ref 3.5–5.1)
SODIUM: 141 mmol/L (ref 135–145)

## 2017-03-24 LAB — HEPARIN LEVEL (UNFRACTIONATED)
HEPARIN UNFRACTIONATED: 0.42 [IU]/mL (ref 0.30–0.70)
Heparin Unfractionated: 0.24 IU/mL — ABNORMAL LOW (ref 0.30–0.70)
Heparin Unfractionated: 0.41 IU/mL (ref 0.30–0.70)

## 2017-03-24 LAB — CBC
HEMATOCRIT: 37.6 % — AB (ref 40.0–52.0)
Hemoglobin: 12.1 g/dL — ABNORMAL LOW (ref 13.0–18.0)
MCH: 28.8 pg (ref 26.0–34.0)
MCHC: 32.1 g/dL (ref 32.0–36.0)
MCV: 89.9 fL (ref 80.0–100.0)
Platelets: 135 10*3/uL — ABNORMAL LOW (ref 150–440)
RBC: 4.19 MIL/uL — ABNORMAL LOW (ref 4.40–5.90)
RDW: 14.5 % (ref 11.5–14.5)
WBC: 6.6 10*3/uL (ref 3.8–10.6)

## 2017-03-24 MED ORDER — HEPARIN BOLUS VIA INFUSION
1600.0000 [IU] | Freq: Once | INTRAVENOUS | Status: AC
  Administered 2017-03-24: 1600 [IU] via INTRAVENOUS
  Filled 2017-03-24: qty 1600

## 2017-03-24 MED ORDER — INSULIN ASPART 100 UNIT/ML ~~LOC~~ SOLN
4.0000 [IU] | Freq: Three times a day (TID) | SUBCUTANEOUS | Status: DC
  Administered 2017-03-24 – 2017-03-26 (×6): 4 [IU] via SUBCUTANEOUS
  Filled 2017-03-24 (×6): qty 4

## 2017-03-24 NOTE — Progress Notes (Signed)
[employer] - [place] at [facility]   PATIENT NAME: [name]    MR#:  [id number]  DATE OF BIRTH:  Jun 22, 1951  SUBJECTIVE:  CHIEF COMPLAINT:   Chief Complaint  Patient presents with  . Cough   Had shortness of breath and cough. Was On 70% oxygen with high flow nasal cannula. Also had A fib and RVR, Heart rate improved. Converted to sinus rhythm. Now on nasal canula oxygen 4 ltr/min.   Sitting in recliner without any complaints.   Renal func slightly better with leg edema.  REVIEW OF SYSTEMS:    Review of Systems  Constitutional: Positive for malaise/fatigue. Negative for chills and fever.  HENT: Negative for sore throat.   Eyes: Negative for blurred vision, double vision and pain.  Respiratory: Positive for cough and shortness of breath. Negative for hemoptysis and wheezing.   Cardiovascular: Positive for orthopnea. Negative for chest pain, palpitations and leg swelling.  Gastrointestinal: Negative for abdominal pain, constipation, diarrhea, heartburn, nausea and vomiting.  Genitourinary: Negative for dysuria and hematuria.  Musculoskeletal: Negative for back pain and joint pain.  Skin: Negative for rash.  Neurological: Positive for weakness. Negative for sensory change, speech change, focal weakness and headaches.  Endo/Heme/Allergies: Does not bruise/bleed easily.  Psychiatric/Behavioral: Negative for depression. The patient is not nervous/anxious.     DRUG ALLERGIES:   Allergies  Allergen Reactions  . Metformin And Related Other (See Comments)    "shakes" per pt  . Morphine And Related Other (See Comments)    Hallucinations, sweats    VITALS:  Blood pressure (!) 107/40, pulse 64, temperature 98.3 F (36.8 C), temperature source Oral, resp. rate 18, height 5\' 8"  (1.727 m), weight (!) 157.9 kg (348 lb), SpO2 99 %.  PHYSICAL EXAMINATION:   Physical Exam  GENERAL:  66 y.o.-year-old patient sitting in a chair. Morbidly obese. Conversational  dyspnea.  EYES: Pupils equal, round, reactive to light and accommodation. No scleral icterus. Extraocular muscles intact.  HEENT: Head atraumatic, normocephalic. Oropharynx and nasopharynx clear.  NECK:  Supple, no jugular venous distention. No thyroid enlargement, no tenderness.  LUNGS: Normal breath sounds bilaterally, no wheezing, Some crepitations. No use of accessory muscles of respiration.  CARDIOVASCULAR: S1, S2. Irregular. Tachycardic ABDOMEN: Soft, nontender, nondistended. Bowel sounds present. No organomegaly or mass.  EXTREMITIES: No cyanosis, clubbing. B/L LE edema NEUROLOGIC: Cranial nerves II through XII are intact. No focal Motor or sensory deficits b/l.   PSYCHIATRIC: The patient is alert and oriented x 3.  SKIN: No obvious rash, lesion, or ulcer.   LABORATORY PANEL:   CBC  Recent Labs Lab 03/24/17 0442  WBC 6.6  HGB 12.1*  HCT 37.6*  PLT 135*   ------------------------------------------------------------------------------------------------------------------ Chemistries   Recent Labs Lab 03/24/17 0442  NA 141  K 5.0  CL 101  CO2 33*  GLUCOSE 266*  BUN 74*  CREATININE 1.87*  CALCIUM 8.6*   ------------------------------------------------------------------------------------------------------------------  Cardiac Enzymes  Recent Labs Lab 03/18/17 1342  TROPONINI <0.03   ------------------------------------------------------------------------------------------------------------------  RADIOLOGY:  No results found.   ASSESSMENT AND PLAN:   * Acute on chronic resp failure due to COPD exacerbation and Bibasilar pneumonia- HCAP as recent admission. On  cefepime. MRSA PCR negative.Will give total 7 days of antibiotic therapy. IV steroids . Nebs Scheduled Was on HFNC oxygen, now on 4 liters nasal canula . Cont to taper. 2-3 ltr is baseline. Appreciate pulmonary input. I wanted to do CT angio chest to r/o PE, ordered , but pt said- since many  years-  he feels very SOB and like to be in recliner only. again he insisted- he can not stay supine on CT scan table for few minutes for scan, cancelled imaging as he is clinically improving.  He follows with pulmonologist in office.   He is on anticoagulation for A fib anyways.  * Atrial fibrillation Cardizem Improved HR, converted to normal sinus rhythm. Received 1 dose digoxin  Appreciate cardiology input  Also on Bystolic.  Cardiovascular insisted to continue on heparin IV drip for now and convert to oral agent once renal function improves.  * ARF - likely ATN from hypotension and pneumonia Nephrology following Baseline creatinine- 1, went up to 3, now 2.5 -> 1.87  could not do renal sono, as he can not sleep flat.  * Hyperkalemia due to acute kidney injury. Given one dose kayexalate, improved some.   Now started on patiromer by nephro.  * HTN Cardizem, bystolic  * DVT prophylaxis Heparin iv drip.  All the records are reviewed and case discussed with Care Management/Social Worker Management plans discussed with the patient, family and they are in agreement.  CODE STATUS: FULL CODE  DVT Prophylaxis: SCDs  TOTAL TIME TAKING CARE OF THIS PATIENT: 35 minutes.   POSSIBLE D/C IN 1-2 DAYS, DEPENDING ON CLINICAL CONDITION. Waiting for improvement in renal function before discharge.  Delfino LovettVipul Tru Rana M.D on 03/24/2017 at 8:36 PM  Between 7am to 6pm - Pager - [phone]  After 6pm go to [email or web address] - password [email or web address] [facility]  [facility] [place] [facility]  Office  [phone]  CC: Primary care physician; Sherrie MustacheJadali, Fayegh  Note: This dictation was prepared with Dragon dictation along with smaller phrase technology. Any transcriptional errors that result from this process are unintentional.

## 2017-03-24 NOTE — Progress Notes (Signed)
PT Cancellation Note  Patient Details Name: [name] MRN: [id number] DOB: [birth year]   Cancelled Treatment:    Reason Eval/Treat Not Completed: Patient declined, no reason specified   Pt declined session this morning.  Stated he had just returned from bedside commode.  Pt questioning why he was still getting therapy "I thought I passed that."  Explained reasons for continued therapy while in the hospital to maintain strength and mobility skills.  Pt and wife voiced understanding.  Will continue to offer and encourage therapy services.   Danielle DessSarah Rip Hawes 03/24/2017, 11:09 AM

## 2017-03-24 NOTE — Progress Notes (Signed)
Pt does not want to wear Bipap at this time. RN aware.

## 2017-03-24 NOTE — Progress Notes (Signed)
Inpatient Diabetes Program Recommendations  AACE/ADA: New Consensus Statement on Inpatient Glycemic Control (2015)  Target Ranges:  Prepandial:   less than 140 mg/dL      Peak postprandial:   less than 180 mg/dL (1-2 hours)      Critically ill patients:  140 - 180 mg/dL   Lab Results  Component Value Date   GLUCAP 192 (H) 03/24/2017   HGBA1C 6.6 (H) 03/20/2017    Review of Glycemic Control  Results for [name], [name] (MRN [id number]) as of 03/24/2017 10:46  Ref. Range 03/23/2017 07:33 03/23/2017 11:51 03/23/2017 16:25 03/23/2017 21:26 03/24/2017 07:48  Glucose-Capillary Latest Ref Range: 65 - 99 mg/dL 409281 (H) 811248 (H) 914383 (H) 397 (H) 192 (H)     Diabetes history:Type 2 diabetes Outpatient Diabetes medications: Glucotrol 10 mg bid Current orders for Inpatient glycemic control: Novolog moderate correction scale tid with meals, Lantus 16 units tid                   * prednisone 50mg  oral qam  Inpatient Diabetes Program Recommendations:   Please consider ordering Novolog 0-5 units QHS for bedtime correction scale.  * Prednisone 50 mg QAM today. While ordered steroids, please consider ordering Novolog 4 units TID with meals for meal coverage.  Susette RacerJulie Pierce Biagini, RN, BA, MHA, CDE Diabetes Coordinator Inpatient Diabetes Program  [phone] (Team Pager) [phone] [facility] Office) 03/24/2017 10:48 AM

## 2017-03-24 NOTE — Progress Notes (Addendum)
ANTICOAGULATION CONSULT NOTE - FOLLOW UP  Consult  Pharmacy Consult for Heparin  Indication: atrial fibrillation   Allergies  Allergen Reactions  . Metformin And Related Other (See Comments)    "shakes" per pt  . Morphine And Related Other (See Comments)    Hallucinations, sweats    Patient Measurements: Height: 5\' 8"  (172.7 cm) Weight: (!) 348 lb (157.9 kg) IBW/kg (Calculated) : 68.4 Heparin Dosing Weight: 107  Vital Signs: Temp: 98 F (36.7 C) (05/17 1137) Temp Source: Oral (05/17 1137) BP: 128/68 (05/17 1137) Pulse Rate: 72 (05/17 1137)  Labs:  Recent Labs  03/22/17 0342  03/22/17 1027 03/23/17 0515 03/24/17 0442 03/24/17 1214 03/24/17 1802  HGB  --   < > 12.2* 12.7* 12.1*  --   --   HCT  --   --  38.2* 39.4* 37.6*  --   --   PLT  --   --  145* 132* 135*  --   --   HEPARINUNFRC 0.62  --  0.44 0.41 0.24* 0.42 0.41  CREATININE 2.82*  --   --  2.50* 1.87*  --   --   < > = values in this interval not displayed.  Estimated Creatinine Clearance: 58 mL/min (A) (by C-G formula based on SCr of 1.87 mg/dL (H)).   Medical History: Past Medical History:  Diagnosis Date  . Asthma   . COPD (chronic obstructive pulmonary disease) (HCC)   . Heart attack (HCC)   . High blood pressure   . High cholesterol   . Scoliosis   . Seasonal allergies   . Sleep apnea     Medications:  Prescriptions Prior to Admission  Medication Sig Dispense Refill Last Dose  . aspirin EC 81 MG tablet Take 81 mg by mouth daily.   03/18/2017 at Unknown time  . cetirizine (ZYRTEC) 10 MG tablet Take 10 mg by mouth daily.   03/18/2017 at Unknown time  . clopidogrel (PLAVIX) 75 MG tablet Take 75 mg by mouth daily.   03/18/2017 at Unknown time  . ferrous sulfate 325 (65 FE) MG EC tablet Take 325 mg by mouth daily with breakfast.   03/18/2017 at Unknown time  . furosemide (LASIX) 40 MG tablet Take 40 mg by mouth 2 (two) times daily.    03/18/2017 at Unknown time  . glipiZIDE (GLUCOTROL) 10 MG tablet  Take 10 mg by mouth 2 (two) times daily before a meal.   03/18/2017 at Unknown time  . Nebivolol HCl 20 MG TABS Take 10-40 mg by mouth 2 (two) times daily. 40mg  in the morning and 10mg  at bedtime   03/18/2017 at Unknown time  . omeprazole (PRILOSEC) 20 MG capsule Take 20 mg by mouth daily.   03/18/2017 at Unknown time  . OXYGEN Inhale 2 L into the lungs continuous.   03/18/2017 at Unknown time  . polyethylene glycol powder (GLYCOLAX/MIRALAX) powder Dissolve one heaping tablespoon in 4-8 ounces of juice or water and drink once daily. (Patient taking differently: Take 17 g by mouth daily as needed for mild constipation. ) 255 g 0 prn at prn  . pregabalin (LYRICA) 50 MG capsule Take 50 mg by mouth 2 (two) times daily.   03/18/2017 at Unknown time  . rosuvastatin (CRESTOR) 10 MG tablet Take 10 mg by mouth daily. Reported on 04/22/2016   03/18/2017 at Unknown time  . tamsulosin (FLOMAX) 0.4 MG CAPS capsule Take 0.4 mg by mouth daily.   03/18/2017 at Unknown time  . telmisartan (MICARDIS) 80  MG tablet Take 80 mg by mouth daily.   03/18/2017 at Unknown time  . Vitamin D, Ergocalciferol, (DRISDOL) 50000 units CAPS capsule Take 50,000 Units by mouth every 7 (seven) days. Patient takes on Monday   Past Week at Unknown time   Scheduled:  . ferrous sulfate  325 mg Oral Q breakfast  . fluticasone  1 spray Each Nare BID  . insulin aspart  0-15 Units Subcutaneous TID AC & HS  . insulin aspart  4 Units Subcutaneous TID WC  . insulin glargine  16 Units Subcutaneous QHS  . ipratropium-albuterol  3 mL Nebulization TID  . loratadine  10 mg Oral Daily  . mouth rinse  15 mL Mouth Rinse BID  . metoprolol tartrate  12.5 mg Oral Q6H  . pantoprazole  40 mg Oral Daily  . patiromer  16.8 g Oral Daily  . predniSONE  50 mg Oral Q breakfast  . pregabalin  50 mg Oral BID  . rosuvastatin  10 mg Oral Daily  . tamsulosin  0.4 mg Oral Daily    Assessment: Pharmacy consulted to dose and monitor heparin therapy in this 66 year old  male being treated for atrial fibrillation   Goal of Therapy:  Heparin level 0.3-0.7 units/ml Monitor platelets by anticoagulation protocol: Yes   Plan:  5/17: Heparin level resulted @ 0.42. Will continue current rate. Recheck level @ 1800.   5/17 Heparin level resulted @  0.41. This is the second therapeutic level. Will continue current rate of 1600units/hr and  will move to checking heparin levels and CBCs daily with AM labs.    Gardner CandleSheema M Hallaji, PharmD, BCPS Clinical Pharmacist 03/24/2017 7:07 PM    5/18 AM heparin level 0.43. Continue current regimen. Recheck heparin level and CBC with tomorrow AM labs.  Fulton ReekMatt Ules Marsala, PharmD, BCPS  03/25/17 5:52 AM

## 2017-03-24 NOTE — Progress Notes (Signed)
ANTICOAGULATION CONSULT NOTE - FOLLOW UP  Consult  Pharmacy Consult for Heparin  Indication: atrial fibrillation   Allergies  Allergen Reactions  . Metformin And Related Other (See Comments)    "shakes" per pt  . Morphine And Related Other (See Comments)    Hallucinations, sweats    Patient Measurements: Height: 5\' 8"  (172.7 cm) Weight: (!) 348 lb (157.9 kg) IBW/kg (Calculated) : 68.4 Heparin Dosing Weight: 107  Vital Signs: Temp: 98 F (36.7 C) (05/17 1137) Temp Source: Oral (05/17 1137) BP: 128/68 (05/17 1137) Pulse Rate: 72 (05/17 1137)  Labs:  Recent Labs  03/22/17 0342  03/22/17 1027 03/23/17 0515 03/24/17 0442 03/24/17 1214  HGB  --   < > 12.2* 12.7* 12.1*  --   HCT  --   --  38.2* 39.4* 37.6*  --   PLT  --   --  145* 132* 135*  --   HEPARINUNFRC 0.62  --  0.44 0.41 0.24* 0.42  CREATININE 2.82*  --   --  2.50* 1.87*  --   < > = values in this interval not displayed.  Estimated Creatinine Clearance: 58 mL/min (A) (by C-G formula based on SCr of 1.87 mg/dL (H)).   Medical History: Past Medical History:  Diagnosis Date  . Asthma   . COPD (chronic obstructive pulmonary disease) (HCC)   . Heart attack (HCC)   . High blood pressure   . High cholesterol   . Scoliosis   . Seasonal allergies   . Sleep apnea     Medications:  Prescriptions Prior to Admission  Medication Sig Dispense Refill Last Dose  . aspirin EC 81 MG tablet Take 81 mg by mouth daily.   03/18/2017 at Unknown time  . cetirizine (ZYRTEC) 10 MG tablet Take 10 mg by mouth daily.   03/18/2017 at Unknown time  . clopidogrel (PLAVIX) 75 MG tablet Take 75 mg by mouth daily.   03/18/2017 at Unknown time  . ferrous sulfate 325 (65 FE) MG EC tablet Take 325 mg by mouth daily with breakfast.   03/18/2017 at Unknown time  . furosemide (LASIX) 40 MG tablet Take 40 mg by mouth 2 (two) times daily.    03/18/2017 at Unknown time  . glipiZIDE (GLUCOTROL) 10 MG tablet Take 10 mg by mouth 2 (two) times daily  before a meal.   03/18/2017 at Unknown time  . Nebivolol HCl 20 MG TABS Take 10-40 mg by mouth 2 (two) times daily. 40mg  in the morning and 10mg  at bedtime   03/18/2017 at Unknown time  . omeprazole (PRILOSEC) 20 MG capsule Take 20 mg by mouth daily.   03/18/2017 at Unknown time  . OXYGEN Inhale 2 L into the lungs continuous.   03/18/2017 at Unknown time  . polyethylene glycol powder (GLYCOLAX/MIRALAX) powder Dissolve one heaping tablespoon in 4-8 ounces of juice or water and drink once daily. (Patient taking differently: Take 17 g by mouth daily as needed for mild constipation. ) 255 g 0 prn at prn  . pregabalin (LYRICA) 50 MG capsule Take 50 mg by mouth 2 (two) times daily.   03/18/2017 at Unknown time  . rosuvastatin (CRESTOR) 10 MG tablet Take 10 mg by mouth daily. Reported on 04/22/2016   03/18/2017 at Unknown time  . tamsulosin (FLOMAX) 0.4 MG CAPS capsule Take 0.4 mg by mouth daily.   03/18/2017 at Unknown time  . telmisartan (MICARDIS) 80 MG tablet Take 80 mg by mouth daily.   03/18/2017 at Unknown time  .  Vitamin D, Ergocalciferol, (DRISDOL) 50000 units CAPS capsule Take 50,000 Units by mouth every 7 (seven) days. Patient takes on Monday   Past Week at Unknown time   Scheduled:  . ferrous sulfate  325 mg Oral Q breakfast  . fluticasone  1 spray Each Nare BID  . insulin aspart  0-15 Units Subcutaneous TID AC & HS  . insulin glargine  16 Units Subcutaneous QHS  . ipratropium-albuterol  3 mL Nebulization TID  . loratadine  10 mg Oral Daily  . mouth rinse  15 mL Mouth Rinse BID  . metoprolol tartrate  12.5 mg Oral Q6H  . pantoprazole  40 mg Oral Daily  . patiromer  16.8 g Oral Daily  . predniSONE  50 mg Oral Q breakfast  . pregabalin  50 mg Oral BID  . rosuvastatin  10 mg Oral Daily  . tamsulosin  0.4 mg Oral Daily    Assessment: Pharmacy consulted to dose and monitor heparin therapy in this 66 year old male being treated for atrial fibrillation  Goal of Therapy:  Heparin level 0.3-0.7  units/ml Monitor platelets by anticoagulation protocol: Yes   Plan:  5/17: Heparin level resulted @ 0.42. Will continue current rate. Recheck level @ 1800.    Evalisse Prajapati D 03/24/2017,1:18 PM

## 2017-03-24 NOTE — Progress Notes (Signed)
[facility] [place], [place] 03/24/17  Subjective:  Renal function has improved today. Creatinine down to 1.87. Patient still sitting up in chair.   Objective:  Vital signs in last 24 hours:  Temp:  [97.8 F (36.6 C)-98.4 F (36.9 C)] 98 F (36.7 C) (05/17 1137) Pulse Rate:  [67-79] 72 (05/17 1137) Resp:  [14-19] 14 (05/17 1137) BP: (102-140)/(61-82) 128/68 (05/17 1137) SpO2:  [91 %-99 %] 93 % (05/17 1137)  Weight change:  Filed Weights   03/18/17 1332  Weight: (!) 157.9 kg (348 lb)    Intake/Output:    Intake/Output Summary (Last 24 hours) at 03/24/17 1447 Last data filed at 03/24/17 1441  Gross per 24 hour  Intake              480 ml  Output             2045 ml  Net            -1565 ml     Physical Exam: General: NAD, sitting up in chair  HEENT anicteric  Neck supple  Pulm/lungs Normal effort, basilar rales  CVS/Heart S1S2 no rubs irregular  Abdomen:  Obese, non tender  Extremities: + dependent edema  Neurologic: Alert, oriented, follows commands  Skin: No acute rashes          Basic Metabolic Panel:   Recent Labs Lab 03/20/17 0422 03/21/17 0444 03/22/17 0342 03/23/17 0515 03/24/17 0442  NA 135 134* 135 136 141  K 5.9* 5.6* 5.2* 5.4* 5.0  CL 97* 94* 94* 98* 101  CO2 32 32 33* 32 33*  GLUCOSE 250* 283* 284* 281* 266*  BUN 43* 63* 69* 77* 74*  CREATININE 2.17* 2.71* 2.82* 2.50* 1.87*  CALCIUM 8.6* 8.1* 8.2* 8.3* 8.6*     CBC:  Recent Labs Lab 03/18/17 1342 03/19/17 0330 03/21/17 1044 03/22/17 1027 03/23/17 0515 03/24/17 0442  WBC 6.9 6.6 11.2* 8.8 6.4 6.6  NEUTROABS 4.8  --   --   --   --   --   HGB 12.8* 13.1 12.4* 12.2* 12.7* 12.1*  HCT 40.1 40.5 39.2* 38.2* 39.4* 37.6*  MCV 90.5 90.5 90.5 89.8 91.2 89.9  PLT 125* 124* 152 145* 132* 135*     No results found for: HEPBSAG, HEPBSAB, HEPBIGM    Microbiology:  Recent Results (from the past 240 hour(s))  Blood culture (routine x 2)     Status: None   Collection Time: 03/18/17  6:13 PM  Result Value Ref Range Status   Specimen Description BLOOD  R AC  Final   Special Requests BOTTLES DRAWN AEROBIC AND ANAEROBIC BCAV  Final   Culture NO GROWTH 5 DAYS  Final   Report Status 03/23/2017 FINAL  Final  Blood culture (routine x 2)     Status: None   Collection Time: 03/18/17  6:13 PM  Result Value Ref Range Status   Specimen Description BLOOD  L FOREARM  Final   Special Requests BOTTLES DRAWN AEROBIC AND ANAEROBIC BCHV  Final   Culture NO GROWTH 5 DAYS  Final   Report Status 03/23/2017 FINAL  Final    Coagulation Studies: No results for input(s): LABPROT, INR in the last 72 hours.  Urinalysis: No results for input(s): COLORURINE, LABSPEC, PHURINE, GLUCOSEU, HGBUR, BILIRUBINUR, KETONESUR, PROTEINUR, UROBILINOGEN, NITRITE, LEUKOCYTESUR in the last 72 hours.  Invalid input(s): APPERANCEUR    Imaging: No results found.   Medications:   . heparin 1,600 Units/hr (03/24/17 62130607)   . ferrous  sulfate  325 mg Oral Q breakfast  . fluticasone  1 spray Each Nare BID  . insulin aspart  0-15 Units Subcutaneous TID AC & HS  . insulin aspart  4 Units Subcutaneous TID WC  . insulin glargine  16 Units Subcutaneous QHS  . ipratropium-albuterol  3 mL Nebulization TID  . loratadine  10 mg Oral Daily  . mouth rinse  15 mL Mouth Rinse BID  . metoprolol tartrate  12.5 mg Oral Q6H  . pantoprazole  40 mg Oral Daily  . patiromer  16.8 g Oral Daily  . predniSONE  50 mg Oral Q breakfast  . pregabalin  50 mg Oral BID  . rosuvastatin  10 mg Oral Daily  . tamsulosin  0.4 mg Oral Daily   docusate sodium, guaiFENesin-dextromethorphan, ipratropium-albuterol, metoprolol tartrate, ondansetron, traMADol  Assessment/ Plan:  66 y.o. male with medical problems of morbid obesity, chronic respiratory failure, chronic congestive heart failure, sleep apnea, type 2 diabetes for over 10 years, history of MI, history of kidney stone, atrial fibrillation February 2017  admitted for pneumonia  1.  ARF - likely ATN from hypotension and pneumonia Baseline Cr 1.02 from 03/04/2017 - renal function has improved a bit.  BUN is currently 74 and creatinine is down to 1.87.  Patient has been taken off of IV fluids.  Continue to monitor renal function daily.  2. LE edema - Possibly combination of lymphedema and 3rd spacing -  Lasix previously placed on hold as renal function was worsening on Lasix.  3. Hyperkalemia - potassium now down to 5.0.  Continue patiromer.   4. Bacterial Pneumonia Patient previously on cefepime.  He has completed antibiotic therapy.      LOS: 6 Taleyah Hillman 5/17/20182:47 PM  [facility] [place], [place] [phone]

## 2017-03-24 NOTE — Progress Notes (Signed)
ANTICOAGULATION CONSULT NOTE - Initial Consult  Pharmacy Consult for Heparin  Indication: atrial fibrillation   Allergies  Allergen Reactions  . Metformin And Related Other (See Comments)    "shakes" per pt  . Morphine And Related Other (See Comments)    Hallucinations, sweats    Patient Measurements: Height: 5\' 8"  (172.7 cm) Weight: (!) 348 lb (157.9 kg) IBW/kg (Calculated) : 68.4 Heparin Dosing Weight: 107  Vital Signs: Temp: 97.8 F (36.6 C) (05/16 2017) Temp Source: Oral (05/16 2017) BP: 126/73 (05/17 0428) Pulse Rate: 70 (05/17 0428)  Labs:  Recent Labs  03/21/17 1044  03/22/17 0342 03/22/17 1027 03/23/17 0515 03/24/17 0442  HGB 12.4*  --   --  12.2* 12.7* 12.1*  HCT 39.2*  --   --  38.2* 39.4* 37.6*  PLT 152  --   --  145* 132* 135*  APTT 142*  --   --   --   --   --   LABPROT 14.1  --   --   --   --   --   INR 1.09  --   --   --   --   --   HEPARINUNFRC  --   < > 0.62 0.44 0.41 0.24*  CREATININE  --   --  2.82*  --  2.50* 1.87*  < > = values in this interval not displayed.  Estimated Creatinine Clearance: 58 mL/min (A) (by C-G formula based on SCr of 1.87 mg/dL (H)).   Medical History: Past Medical History:  Diagnosis Date  . Asthma   . COPD (chronic obstructive pulmonary disease) (HCC)   . Heart attack (HCC)   . High blood pressure   . High cholesterol   . Scoliosis   . Seasonal allergies   . Sleep apnea     Medications:  Prescriptions Prior to Admission  Medication Sig Dispense Refill Last Dose  . aspirin EC 81 MG tablet Take 81 mg by mouth daily.   03/18/2017 at Unknown time  . cetirizine (ZYRTEC) 10 MG tablet Take 10 mg by mouth daily.   03/18/2017 at Unknown time  . clopidogrel (PLAVIX) 75 MG tablet Take 75 mg by mouth daily.   03/18/2017 at Unknown time  . ferrous sulfate 325 (65 FE) MG EC tablet Take 325 mg by mouth daily with breakfast.   03/18/2017 at Unknown time  . furosemide (LASIX) 40 MG tablet Take 40 mg by mouth 2 (two) times  daily.    03/18/2017 at Unknown time  . glipiZIDE (GLUCOTROL) 10 MG tablet Take 10 mg by mouth 2 (two) times daily before a meal.   03/18/2017 at Unknown time  . Nebivolol HCl 20 MG TABS Take 10-40 mg by mouth 2 (two) times daily. 40mg  in the morning and 10mg  at bedtime   03/18/2017 at Unknown time  . omeprazole (PRILOSEC) 20 MG capsule Take 20 mg by mouth daily.   03/18/2017 at Unknown time  . OXYGEN Inhale 2 L into the lungs continuous.   03/18/2017 at Unknown time  . polyethylene glycol powder (GLYCOLAX/MIRALAX) powder Dissolve one heaping tablespoon in 4-8 ounces of juice or water and drink once daily. (Patient taking differently: Take 17 g by mouth daily as needed for mild constipation. ) 255 g 0 prn at prn  . pregabalin (LYRICA) 50 MG capsule Take 50 mg by mouth 2 (two) times daily.   03/18/2017 at Unknown time  . rosuvastatin (CRESTOR) 10 MG tablet Take 10 mg by mouth daily. Reported  on 04/22/2016   03/18/2017 at Unknown time  . tamsulosin (FLOMAX) 0.4 MG CAPS capsule Take 0.4 mg by mouth daily.   03/18/2017 at Unknown time  . telmisartan (MICARDIS) 80 MG tablet Take 80 mg by mouth daily.   03/18/2017 at Unknown time  . Vitamin D, Ergocalciferol, (DRISDOL) 50000 units CAPS capsule Take 50,000 Units by mouth every 7 (seven) days. Patient takes on Monday   Past Week at Unknown time   Scheduled:  . ferrous sulfate  325 mg Oral Q breakfast  . fluticasone  1 spray Each Nare BID  . heparin  1,600 Units Intravenous Once  . insulin aspart  0-15 Units Subcutaneous TID AC & HS  . insulin glargine  16 Units Subcutaneous QHS  . ipratropium-albuterol  3 mL Nebulization TID  . loratadine  10 mg Oral Daily  . mouth rinse  15 mL Mouth Rinse BID  . metoprolol tartrate  12.5 mg Oral Q6H  . pantoprazole  40 mg Oral Daily  . patiromer  16.8 g Oral Daily  . predniSONE  50 mg Oral Q breakfast  . pregabalin  50 mg Oral BID  . rosuvastatin  10 mg Oral Daily  . tamsulosin  0.4 mg Oral Daily     Assessment: Pharmacy consulted to dose and monitor heparin therapy in this 66 year old male being treated for atrial fibrillation  Goal of Therapy:  Heparin level 0.3-0.7 units/ml Monitor platelets by anticoagulation protocol: Yes   Plan:  Give 5000 units bolus x 1 Start heparin infusion at 1500 units/hr Check anti-Xa level in 8 hours and daily while on heparin Continue to monitor H&H and platelets  Will check Heparin level @ 1730 since patient is in AKI and CrCl can't be accurately measured to determine renal function in this patient.   5/14:  HL @ 20:17 = 0.72.  Will decrease drip rate to 1400 units/hr and recheck HL 6 hrs after rate change.   05/15 03:30 heparin level 0.62. Continue current regimen. Recheck in 6 hours to confirm. 5/15 Heparin level again within goal range. Will recheck in am.   5/16 AM heparin level 0.44. Continue current regimen. Recheck heparin level and CBC with tomorrow AM labs.  5/17 AM heparin level 0.24. 1600 unit bolus and increase rate to 1600 units/hr. Recheck in 6 hours.   [name] S 03/24/2017,5:35 AM

## 2017-03-24 NOTE — Care Management (Signed)
Barrier to discharge: heart rate (now getting under better control - needs to sustain; renal function - unable to lay flat for renal ultrasound. Confirmed has chronic oxygen through [facility].  Continues to anticipate discharge home with resumption of his home health

## 2017-03-24 NOTE — Plan of Care (Signed)
Problem: Education: Goal: Knowledge of [place] Education information/materials will improve Outcome: Progressing Patient states understanding.   Problem: Safety: Goal: Ability to remain free from injury will improve Outcome: Progressing No injury thus far.   Problem: Health Behavior/Discharge Planning: Goal: Ability to manage health-related needs will improve Outcome: Progressing Needs are improving.   Problem: Pain Managment: Goal: General experience of comfort will improve Outcome: Not Progressing Patient is comfortable in the chair- but overall is not comfortable.   Problem: Physical Regulation: Goal: Ability to maintain clinical measurements within normal limits will improve Outcome: Progressing Patient is improving.   Problem: Activity: Goal: Risk for activity intolerance will decrease Outcome: Progressing Patient can get up with assistance and walker.   Comments: Patient is progressing. Patient still on heparin drip.

## 2017-03-25 LAB — BASIC METABOLIC PANEL
Anion gap: 5 (ref 5–15)
BUN: 58 mg/dL — AB (ref 6–20)
CALCIUM: 8.7 mg/dL — AB (ref 8.9–10.3)
CO2: 33 mmol/L — ABNORMAL HIGH (ref 22–32)
Chloride: 101 mmol/L (ref 101–111)
Creatinine, Ser: 1.62 mg/dL — ABNORMAL HIGH (ref 0.61–1.24)
GFR calc Af Amer: 50 mL/min — ABNORMAL LOW (ref >60)
GFR, EST NON AFRICAN AMERICAN: 43 mL/min — AB (ref >60)
Glucose, Bld: 206 mg/dL — ABNORMAL HIGH (ref 65–99)
Potassium: 4.9 mmol/L (ref 3.5–5.1)
Sodium: 139 mmol/L (ref 135–145)

## 2017-03-25 LAB — GLUCOSE, CAPILLARY
GLUCOSE-CAPILLARY: 353 mg/dL — AB (ref 65–99)
Glucose-Capillary: 195 mg/dL — ABNORMAL HIGH (ref 65–99)
Glucose-Capillary: 190 mg/dL — ABNORMAL HIGH (ref 65–99)
Glucose-Capillary: 266 mg/dL — ABNORMAL HIGH (ref 65–99)

## 2017-03-25 LAB — CBC
HCT: 37.1 % — ABNORMAL LOW (ref 40.0–52.0)
Hemoglobin: 12 g/dL — ABNORMAL LOW (ref 13.0–18.0)
MCH: 28.7 pg (ref 26.0–34.0)
MCHC: 32.3 g/dL (ref 32.0–36.0)
MCV: 88.8 fL (ref 80.0–100.0)
PLATELETS: 140 10*3/uL — AB (ref 150–440)
RBC: 4.18 MIL/uL — ABNORMAL LOW (ref 4.40–5.90)
RDW: 14.5 % (ref 11.5–14.5)
WBC: 6.9 10*3/uL (ref 3.8–10.6)

## 2017-03-25 LAB — HEPARIN LEVEL (UNFRACTIONATED): Heparin Unfractionated: 0.43 IU/mL (ref 0.30–0.70)

## 2017-03-25 MED ORDER — APIXABAN 5 MG PO TABS
5.0000 mg | ORAL_TABLET | Freq: Two times a day (BID) | ORAL | Status: DC
  Administered 2017-03-25 – 2017-03-26 (×3): 5 mg via ORAL
  Filled 2017-03-25 (×3): qty 1

## 2017-03-25 NOTE — Progress Notes (Signed)
[facility] [facility] [place] [facility]   PATIENT NAME: [name]    MR#:  [id number]  DATE OF BIRTH:  10/15/1951  SUBJECTIVE:  CHIEF COMPLAINT:   Chief Complaint  Patient presents with  . Cough   Had shortness of breath and cough. Was On 70% oxygen with high flow nasal cannula. Also had A fib and RVR, Heart rate improved. Converted to sinus rhythm. Now on nasal canula oxygen 4 ltr/min.   Sitting in recliner without any complaints.   Renal func continues to get better, has leg edema. REVIEW OF SYSTEMS:    Review of Systems  Constitutional: Positive for malaise/fatigue. Negative for chills and fever.  HENT: Negative for sore throat.   Eyes: Negative for blurred vision, double vision and pain.  Respiratory: Positive for cough and shortness of breath. Negative for hemoptysis and wheezing.   Cardiovascular: Positive for orthopnea. Negative for chest pain, palpitations and leg swelling.  Gastrointestinal: Negative for abdominal pain, constipation, diarrhea, heartburn, nausea and vomiting.  Genitourinary: Negative for dysuria and hematuria.  Musculoskeletal: Negative for back pain and joint pain.  Skin: Negative for rash.  Neurological: Positive for weakness. Negative for sensory change, speech change, focal weakness and headaches.  Endo/Heme/Allergies: Does not bruise/bleed easily.  Psychiatric/Behavioral: Negative for depression. The patient is not nervous/anxious.     DRUG ALLERGIES:   Allergies  Allergen Reactions  . Metformin And Related Other (See Comments)    "shakes" per pt  . Morphine And Related Other (See Comments)    Hallucinations, sweats    VITALS:  Blood pressure (!) 102/56, pulse 70, temperature 98.3 F (36.8 C), temperature source Oral, resp. rate 18, height 5\' 8"  (1.727 m), weight (!) 157.9 kg (348 lb), SpO2 97 %.  PHYSICAL EXAMINATION:   Physical Exam  GENERAL:  66 y.o.-year-old patient sitting in a chair. Morbidly obese. Conversational  dyspnea.  EYES: Pupils equal, round, reactive to light and accommodation. No scleral icterus. Extraocular muscles intact.  HEENT: Head atraumatic, normocephalic. Oropharynx and nasopharynx clear.  NECK:  Supple, no jugular venous distention. No thyroid enlargement, no tenderness.  LUNGS: Normal breath sounds bilaterally, no wheezing, Some crepitations. No use of accessory muscles of respiration.  CARDIOVASCULAR: S1, S2. Irregular. Tachycardic ABDOMEN: Soft, nontender, nondistended. Bowel sounds present. No organomegaly or mass.  EXTREMITIES: No cyanosis, clubbing. B/L LE edema NEUROLOGIC: Cranial nerves II through XII are intact. No focal Motor or sensory deficits b/l.   PSYCHIATRIC: The patient is alert and oriented x 3.  SKIN: No obvious rash, lesion, or ulcer.   LABORATORY PANEL:   CBC  Recent Labs Lab 03/25/17 0423  WBC 6.9  HGB 12.0*  HCT 37.1*  PLT 140*   ------------------------------------------------------------------------------------------------------------------ Chemistries   Recent Labs Lab 03/25/17 0423  NA 139  K 4.9  CL 101  CO2 33*  GLUCOSE 206*  BUN 58*  CREATININE 1.62*  CALCIUM 8.7*   ------------------------------------------------------------------------------------------------------------------  Cardiac Enzymes  Recent Labs Lab 03/18/17 1342  TROPONINI <0.03   ------------------------------------------------------------------------------------------------------------------  RADIOLOGY:  No results found.   ASSESSMENT AND PLAN:   * Acute on chronic resp failure due to COPD exacerbation and Bibasilar pneumonia- HCAP as recent admission. On  cefepime. MRSA PCR negative.Will give total 7 days of antibiotic therapy. On PO Prednisone. Nebs Scheduled Was on [facility] oxygen, now on 4 liters nasal canula . Cont to wean (d/w nursing). 2-3 ltr is baseline. Appreciate pulmonary input. I wanted to do CT angio chest to r/o PE, ordered , but pt  said-  since many years- he feels very SOB and like to be in recliner only. again he insisted- he can not stay supine on CT scan table for few minutes for scan, cancelled imaging as he is clinically improving.  He follows with pulmonologist in office.   He is on anticoagulation for A fib anyways.  * Atrial fibrillation Cardizem Improved HR, converted to normal sinus rhythm. Received 1 dose digoxin  Appreciate cardiology input  Also on Bystolic.  - switch him to eliquis as renal function improving. D/w cardio who is in agreement. D/w pharmacist to renally dose eliquis  * ARF - likely ATN from hypotension and pneumonia Nephrology following Baseline creatinine- 1, went up to 3, now 2.5 -> 1.87->1.6  * Hyperkalemia due to acute kidney injury. Given one dose kayexalate, improved some. - stop patiromer.  * HTN Cardizem, Bystolic  * DVT prophylaxis Heparin iv drip - switch over to Eliquis today    All the records are reviewed and case discussed with Care Management/Social Worker Management plans discussed with the patient, family and they are in agreement.  CODE STATUS: FULL CODE  DVT Prophylaxis: SCDs  TOTAL TIME TAKING CARE OF THIS PATIENT: 35 minutes.   POSSIBLE D/C IN 1-2 DAYS, DEPENDING ON CLINICAL CONDITION.   Delfino LovettVipul Lovell Nuttall M.D on 03/25/2017 at 8:16 AM  Between 7am to 6pm - Pager - [phone]  After 6pm go to [email or web address] - password [email or web address] [email or web address]  [facility] [facility]  Office  [phone]  CC: Primary care physician; Sherrie MustacheJadali, Fayegh  Note: This dictation was prepared with Dragon dictation along with smaller phrase technology. Any transcriptional errors that result from this process are unintentional.

## 2017-03-25 NOTE — Care Management Important Message (Signed)
Important Message  Patient Details  Name: [name] MRN: [id number] Date of Birth: September 01, 1951   Medicare Important Message Given:  Yes Signed IM notice given   Eber HongGreene, Shaden Higley R, RN 03/25/2017, 11:38 AM

## 2017-03-25 NOTE — Progress Notes (Signed)
[facility] [place], [place] 03/25/17  Subjective:  Patient improving. Cr down to 1.6 BUN down to 58. Pt in good spirits, still sitting up in chair.    Objective:  Vital signs in last 24 hours:  Temp:  [98.3 F (36.8 C)-99.2 F (37.3 C)] 99.2 F (37.3 C) (05/18 1145) Pulse Rate:  [60-70] 60 (05/18 1145) Resp:  [18] 18 (05/18 1145) BP: (102-146)/(40-81) 146/81 (05/18 1145) SpO2:  [93 %-99 %] 93 % (05/18 1145)  Weight change:  Filed Weights   03/18/17 1332  Weight: (!) 157.9 kg (348 lb)    Intake/Output:    Intake/Output Summary (Last 24 hours) at 03/25/17 1151 Last data filed at 03/25/17 0900  Gross per 24 hour  Intake              240 ml  Output             2375 ml  Net            -2135 ml     Physical Exam: General: NAD, sitting up in chair  HEENT anicteric  Neck supple  Pulm/lungs Normal effort, basilar rales  CVS/Heart S1S2 no rubs irregular  Abdomen:  Obese, non tender  Extremities: 2+ dependent edema  Neurologic: Alert, oriented, follows commands  Skin: No acute rashes          Basic Metabolic Panel:   Recent Labs Lab 03/21/17 0444 03/22/17 0342 03/23/17 0515 03/24/17 0442 03/25/17 0423  NA 134* 135 136 141 139  K 5.6* 5.2* 5.4* 5.0 4.9  CL 94* 94* 98* 101 101  CO2 32 33* 32 33* 33*  GLUCOSE 283* 284* 281* 266* 206*  BUN 63* 69* 77* 74* 58*  CREATININE 2.71* 2.82* 2.50* 1.87* 1.62*  CALCIUM 8.1* 8.2* 8.3* 8.6* 8.7*     CBC:  Recent Labs Lab 03/18/17 1342  03/21/17 1044 03/22/17 1027 03/23/17 0515 03/24/17 0442 03/25/17 0423  WBC 6.9  < > 11.2* 8.8 6.4 6.6 6.9  NEUTROABS 4.8  --   --   --   --   --   --   HGB 12.8*  < > 12.4* 12.2* 12.7* 12.1* 12.0*  HCT 40.1  < > 39.2* 38.2* 39.4* 37.6* 37.1*  MCV 90.5  < > 90.5 89.8 91.2 89.9 88.8  PLT 125*  < > 152 145* 132* 135* 140*  < > = values in this interval not displayed.   No results found for: HEPBSAG, HEPBSAB, HEPBIGM    Microbiology:  Recent  Results (from the past 240 hour(s))  Blood culture (routine x 2)     Status: None   Collection Time: 03/18/17  6:13 PM  Result Value Ref Range Status   Specimen Description BLOOD  R AC  Final   Special Requests BOTTLES DRAWN AEROBIC AND ANAEROBIC BCAV  Final   Culture NO GROWTH 5 DAYS  Final   Report Status 03/23/2017 FINAL  Final  Blood culture (routine x 2)     Status: None   Collection Time: 03/18/17  6:13 PM  Result Value Ref Range Status   Specimen Description BLOOD  L FOREARM  Final   Special Requests BOTTLES DRAWN AEROBIC AND ANAEROBIC BCHV  Final   Culture NO GROWTH 5 DAYS  Final   Report Status 03/23/2017 FINAL  Final    Coagulation Studies: No results for input(s): LABPROT, INR in the last 72 hours.  Urinalysis: No results for input(s): COLORURINE, LABSPEC, PHURINE, GLUCOSEU, HGBUR, BILIRUBINUR, KETONESUR, PROTEINUR, UROBILINOGEN,  NITRITE, LEUKOCYTESUR in the last 72 hours.  Invalid input(s): APPERANCEUR    Imaging: No results found.   Medications:    . apixaban  5 mg Oral BID  . ferrous sulfate  325 mg Oral Q breakfast  . fluticasone  1 spray Each Nare BID  . insulin aspart  0-15 Units Subcutaneous TID AC & HS  . insulin aspart  4 Units Subcutaneous TID WC  . insulin glargine  16 Units Subcutaneous QHS  . ipratropium-albuterol  3 mL Nebulization TID  . loratadine  10 mg Oral Daily  . mouth rinse  15 mL Mouth Rinse BID  . metoprolol tartrate  12.5 mg Oral Q6H  . pantoprazole  40 mg Oral Daily  . predniSONE  50 mg Oral Q breakfast  . pregabalin  50 mg Oral BID  . rosuvastatin  10 mg Oral Daily  . tamsulosin  0.4 mg Oral Daily   docusate sodium, guaiFENesin-dextromethorphan, ipratropium-albuterol, metoprolol tartrate, ondansetron, traMADol  Assessment/ Plan:  66 y.o. male with medical problems of morbid obesity, chronic respiratory failure, chronic congestive heart failure, sleep apnea, type 2 diabetes for over 10 years, history of MI, history of kidney  stone, atrial fibrillation February 2017 admitted for pneumonia  1.  ARF - likely ATN from hypotension and pneumonia Baseline Cr 1.02 from 03/04/2017 - Renal function is imrpoving, BUN down to 58, Cr down to 1.6.  Patient is off both IV fluids as well as Lasix. Continue to monitor renal function.  2. LE edema - Possibly combination of lymphedema and 3rd spacing -  As above IV fluids and Lasix have been held now. He was being given IV fluids in hopes to improve his renal function previously.  3. Hyperkalemia - Potassium stabilized at 4.9. Continue patiromer.   4. Bacterial Pneumonia Patient previously on cefepime.  He has completed antibiotic therapy.      LOS: 7 Daijon Wenke 5/18/201811:51 AM  [address] [facility] [place], [place] [phone]

## 2017-03-26 LAB — BASIC METABOLIC PANEL
Anion gap: 6 (ref 5–15)
BUN: 48 mg/dL — AB (ref 6–20)
CO2: 35 mmol/L — ABNORMAL HIGH (ref 22–32)
Calcium: 8.8 mg/dL — ABNORMAL LOW (ref 8.9–10.3)
Chloride: 102 mmol/L (ref 101–111)
Creatinine, Ser: 1.31 mg/dL — ABNORMAL HIGH (ref 0.61–1.24)
GFR calc Af Amer: >60 mL/min (ref >60)
GFR calc non Af Amer: 56 mL/min — ABNORMAL LOW (ref >60)
GLUCOSE: 204 mg/dL — AB (ref 65–99)
POTASSIUM: 4.9 mmol/L (ref 3.5–5.1)
SODIUM: 143 mmol/L (ref 135–145)

## 2017-03-26 LAB — GLUCOSE, CAPILLARY
GLUCOSE-CAPILLARY: 146 mg/dL — AB (ref 65–99)
GLUCOSE-CAPILLARY: 175 mg/dL — AB (ref 65–99)

## 2017-03-26 LAB — CBC
HCT: 38.1 % — ABNORMAL LOW (ref 40.0–52.0)
HEMOGLOBIN: 12.2 g/dL — AB (ref 13.0–18.0)
MCH: 28.5 pg (ref 26.0–34.0)
MCHC: 32.1 g/dL (ref 32.0–36.0)
MCV: 88.8 fL (ref 80.0–100.0)
Platelets: 138 10*3/uL — ABNORMAL LOW (ref 150–440)
RBC: 4.29 MIL/uL — ABNORMAL LOW (ref 4.40–5.90)
RDW: 14.7 % — ABNORMAL HIGH (ref 11.5–14.5)
WBC: 7.9 10*3/uL (ref 3.8–10.6)

## 2017-03-26 MED ORDER — TRAMADOL HCL 50 MG PO TABS: 50.0000 mg | ORAL_TABLET | Freq: Four times a day (QID) | ORAL | 0 refills | Status: DC | PRN

## 2017-03-26 MED ORDER — DOCUSATE SODIUM 100 MG PO CAPS: 100.0000 mg | ORAL_CAPSULE | Freq: Two times a day (BID) | ORAL | 0 refills | Status: AC | PRN

## 2017-03-26 MED ORDER — INSULIN GLARGINE 100 UNIT/ML SOLOSTAR PEN: 20.0000 [IU] | PEN_INJECTOR | Freq: Every day | SUBCUTANEOUS | 11 refills | Status: DC

## 2017-03-26 MED ORDER — ONDANSETRON 4 MG PO TBDP: 4.0000 mg | ORAL_TABLET | Freq: Three times a day (TID) | ORAL | 0 refills | Status: DC | PRN

## 2017-03-26 MED ORDER — FLUTICASONE PROPIONATE 50 MCG/ACT NA SUSP: 1.0000 | Freq: Two times a day (BID) | NASAL | 2 refills | Status: AC

## 2017-03-26 MED ORDER — APIXABAN 5 MG PO TABS: 5.0000 mg | ORAL_TABLET | Freq: Two times a day (BID) | ORAL | 0 refills | Status: DC

## 2017-03-26 MED ORDER — PREDNISONE 10 MG (21) PO TBPK: ORAL_TABLET | ORAL | 0 refills | Status: DC

## 2017-03-26 MED ORDER — METOPROLOL TARTRATE 25 MG PO TABS: 25.0000 mg | ORAL_TABLET | Freq: Two times a day (BID) | ORAL | 1 refills | Status: DC

## 2017-03-26 MED ORDER — FUROSEMIDE 40 MG PO TABS: 40.0000 mg | ORAL_TABLET | Freq: Every day | ORAL | 0 refills | Status: DC

## 2017-03-26 MED ORDER — IPRATROPIUM-ALBUTEROL 0.5-2.5 (3) MG/3ML IN SOLN: 3.0000 mL | Freq: Three times a day (TID) | RESPIRATORY_TRACT | 1 refills | Status: DC

## 2017-03-26 NOTE — Progress Notes (Signed)
PT Cancellation Note  Patient Details Name: [name] MRN: [id number] DOB: 05-09-1951   Cancelled Treatment:    Reason Eval/Treat Not Completed: Patient declined, no reason specified;Other (comment). Treatment attempted this morning; pt refused PT stating he was going to be leaving and he had already done therapy this week. At the time of this document, pt does have a current discharge order home.    Scot DockHeidi E Kairyn Olmeda, PTA 03/26/2017, 12:30 PM

## 2017-03-26 NOTE — Discharge Summary (Signed)
[facility] [place] at [facility]   PATIENT NAME: [name]    MR#:  [id number]  DATE OF BIRTH:  1951-05-06  DATE OF ADMISSION:  03/18/2017 ADMITTING PHYSICIAN: Altamese Dilling, MD  DATE OF DISCHARGE: 03/26/2017  PRIMARY CARE PHYSICIAN: Sherrie Mustache    ADMISSION DIAGNOSIS:  SOB (shortness of breath) [R06.02] Hypoxia [R09.02] HCAP (healthcare-associated pneumonia) [J18.9]  DISCHARGE DIAGNOSIS:  Principal Problem:   Healthcare-associated pneumonia Active Problems:   HCAP (healthcare-associated pneumonia)   ATN (acute tubular necrosis) (HCC)   Acute renal failure with acute tubular necrosis superimposed on stage 3 chronic kidney disease (HCC)   Morbid obesity (HCC)   Hypoxia   Persistent atrial fibrillation (HCC)   SECONDARY DIAGNOSIS:   Past Medical History:  Diagnosis Date  . Asthma   . COPD (chronic obstructive pulmonary disease) (HCC)   . Heart attack (HCC)   . High blood pressure   . High cholesterol   . Scoliosis   . Seasonal allergies   . Sleep apnea     HOSPITAL COURSE:   * Acute on chronic resp failure due to COPD exacerbation and Bibasilar pneumonia- HCAP as recent admission. On  cefepime. MRSA PCR negative. given total 7 days of antibiotic therapy. On PO Prednisone. Nebs Scheduled, given nebulizer prescription on d/c and tapering steroids. Was on HFNC oxygen,  Cont to wean (d/w nursing). 2-3 ltr is baseline. Appreciate pulmonary input. I wanted to do CT angio chest to r/o PE, ordered , but pt said- since many years- he feels very SOB and like to be in recliner only. again he insisted- he can not stay supine on CT scan table for few minutes for scan, cancelled imaging as he is clinically improving.  He follows with pulmonologist in office.   He is on anticoagulation for A fib anyways.  * Atrial fibrillation Cardizem Improved HR, converted to normal sinus rhythm. Received 1 dose digoxin  Appreciate cardiology  input  Also on metoprolol.  - switch him to eliquis as renal function improving. D/w cardio who is in agreement. D/w pharmacist to renally dose eliquis  * ARF - likely ATN from hypotension and pneumonia Nephrology following Baseline creatinine- 1, went up to 3, now 2.5 -> 1.87->1.6> 1.3   Decreased dose of lasix on d/c and advised about fluid restriction to help with leg edema.  * Hyperkalemia due to acute kidney injury. Given one dose kayexalate, improved some. - stop patiromer.  * HTN Cardizem, Metoprolol.  * DVT prophylaxis Heparin iv drip - switch over to Eliquis   DISCHARGE CONDITIONS:   Stable.  CONSULTS OBTAINED:  Treatment Team:  Shane Crutch, MD Mosetta Pigeon, MD  DRUG ALLERGIES:   Allergies  Allergen Reactions  . Metformin And Related Other (See Comments)    "shakes" per pt  . Morphine And Related Other (See Comments)    Hallucinations, sweats    DISCHARGE MEDICATIONS:   Current Discharge Medication List    START taking these medications   Details  apixaban (ELIQUIS) 5 MG TABS tablet Take 1 tablet (5 mg total) by mouth 2 (two) times daily. Qty: 60 tablet, Refills: 0    docusate sodium (COLACE) 100 MG capsule Take 1 capsule (100 mg total) by mouth 2 (two) times daily as needed for mild constipation. Qty: 10 capsule, Refills: 0    fluticasone (FLONASE) 50 MCG/ACT nasal spray Place 1 spray into both nostrils 2 (two) times daily. Qty: 16 g, Refills: 2    Insulin Glargine (LANTUS SOLOSTAR)  100 UNIT/ML Solostar Pen Inject 20 Units into the skin daily at 10 pm. Qty: 15 mL, Refills: 11    ipratropium-albuterol (DUONEB) 0.5-2.5 (3) MG/3ML SOLN Take 3 mLs by nebulization 3 (three) times daily. Qty: 360 mL, Refills: 1    metoprolol tartrate (LOPRESSOR) 25 MG tablet Take 1 tablet (25 mg total) by mouth 2 (two) times daily. Qty: 60 tablet, Refills: 1    predniSONE (STERAPRED UNI-PAK 21 TAB) 10 MG (21) TBPK tablet Take 6 tabs first day, 5 tab on  day 2, then 4 on day 3rd, 3 tabs on day 4th , 2 tab on day 5th, and 1 tab on 6th day. Qty: 21 tablet, Refills: 0      CONTINUE these medications which have CHANGED   Details  furosemide (LASIX) 40 MG tablet Take 1 tablet (40 mg total) by mouth daily. Qty: 30 tablet, Refills: 0    ondansetron (ZOFRAN ODT) 4 MG disintegrating tablet Take 1 tablet (4 mg total) by mouth every 8 (eight) hours as needed for nausea or vomiting. Qty: 20 tablet, Refills: 0    traMADol (ULTRAM) 50 MG tablet Take 1 tablet (50 mg total) by mouth every 6 (six) hours as needed for moderate pain. Qty: 20 tablet, Refills: 0      CONTINUE these medications which have NOT CHANGED   Details  aspirin EC 81 MG tablet Take 81 mg by mouth daily.    cetirizine (ZYRTEC) 10 MG tablet Take 10 mg by mouth daily.    ferrous sulfate 325 (65 FE) MG EC tablet Take 325 mg by mouth daily with breakfast.    omeprazole (PRILOSEC) 20 MG capsule Take 20 mg by mouth daily.    OXYGEN Inhale 2 L into the lungs continuous.    polyethylene glycol powder (GLYCOLAX/MIRALAX) powder Dissolve one heaping tablespoon in 4-8 ounces of juice or water and drink once daily. Qty: 255 g, Refills: 0    pregabalin (LYRICA) 50 MG capsule Take 50 mg by mouth 2 (two) times daily.    rosuvastatin (CRESTOR) 10 MG tablet Take 10 mg by mouth daily. Reported on 04/22/2016    tamsulosin (FLOMAX) 0.4 MG CAPS capsule Take 0.4 mg by mouth daily.    Vitamin D, Ergocalciferol, (DRISDOL) 50000 units CAPS capsule Take 50,000 Units by mouth every 7 (seven) days. Patient takes on Monday      STOP taking these medications     clopidogrel (PLAVIX) 75 MG tablet      glipiZIDE (GLUCOTROL) 10 MG tablet      Nebivolol HCl 20 MG TABS      telmisartan (MICARDIS) 80 MG tablet          DISCHARGE INSTRUCTIONS:    Follow with cardiology, Nephrology and pulmonary clinic.  If you experience worsening of your admission symptoms, develop shortness of breath, life  threatening emergency, suicidal or homicidal thoughts you must seek medical attention immediately by calling 911 or calling your MD immediately  if symptoms less severe.  You Must read complete instructions/literature along with all the possible adverse reactions/side effects for all the Medicines you take and that have been prescribed to you. Take any new Medicines after you have completely understood and accept all the possible adverse reactions/side effects.   Please note  You were cared for by a hospitalist during your hospital stay. If you have any questions about your discharge medications or the care you received while you were in the hospital after you are discharged, you can call the unit  and asked to speak with the hospitalist on call if the hospitalist that took care of you is not available. Once you are discharged, your primary care physician will handle any further medical issues. Please note that NO REFILLS for any discharge medications will be authorized once you are discharged, as it is imperative that you return to your primary care physician (or establish a relationship with a primary care physician if you do not have one) for your aftercare needs so that they can reassess your need for medications and monitor your lab values.    Today   CHIEF COMPLAINT:   Chief Complaint  Patient presents with  . Cough    HISTORY OF PRESENT ILLNESS:  [name]  is a 66 y.o. male with a known history of COPD, CAD, high blood pressure, high cholesterol, sleep apnea, recent admission for COPD exacerbation- was discharged on 5 days Levaquin and tapering oral steroid course 2 weeks ago. He finished the medications and was feeling fine. He was able to walk with a walker when he went home. Gradually over the last week he started getting worse again and requiring more and more oxygen up to the level 5-6 L now at home. He is also not able to walk much. Concerned with this his wife called his doctor's  office and the pulmonologist office. He got appointment with the pulmonologist after 2 weeks which she felt is far looking at his condition, and decided to bring him to emergency room.  In ER on x-ray he was noted to have some worsening on his pneumonia and he was on 5 L supplemental oxygen so decided to admit him to hospitalist service.   VITAL SIGNS:  Blood pressure 135/67, pulse 68, temperature 97.8 F (36.6 C), temperature source Oral, resp. rate 16, height 5\' 8"  (1.727 m), weight (!) 157.9 kg (348 lb), SpO2 99 %.  I/O:   Intake/Output Summary (Last 24 hours) at 03/26/17 1225 Last data filed at 03/26/17 0951  Gross per 24 hour  Intake              240 ml  Output             1700 ml  Net            -1460 ml    PHYSICAL EXAMINATION:   GENERAL:  66 y.o.-year-old patient sitting in a chair. Morbidly obese. Conversational dyspnea.  EYES: Pupils equal, round, reactive to light and accommodation. No scleral icterus. Extraocular muscles intact.  HEENT: Head atraumatic, normocephalic. Oropharynx and nasopharynx clear.  NECK:  Supple, no jugular venous distention. No thyroid enlargement, no tenderness.  LUNGS: Normal breath sounds bilaterally, no wheezing, Some crepitations. No use of accessory muscles of respiration.  CARDIOVASCULAR: S1, S2. Irregular. Tachycardic ABDOMEN: Soft, nontender, nondistended. Bowel sounds present. No organomegaly or mass.  EXTREMITIES: No cyanosis, clubbing. B/L LE edema NEUROLOGIC: Cranial nerves II through XII are intact. No focal Motor or sensory deficits b/l.   PSYCHIATRIC: The patient is alert and oriented x 3.  SKIN: No obvious rash, lesion, or ulcer.   DATA REVIEW:   CBC  Recent Labs Lab 03/26/17 0417  WBC 7.9  HGB 12.2*  HCT 38.1*  PLT 138*    Chemistries   Recent Labs Lab 03/26/17 0417  NA 143  K 4.9  CL 102  CO2 35*  GLUCOSE 204*  BUN 48*  CREATININE 1.31*  CALCIUM 8.8*    Cardiac Enzymes No results for input(s):  TROPONINI in the  last 168 hours.  Microbiology Results  Results for orders placed or performed during the hospital encounter of 03/18/17  Blood culture (routine x 2)     Status: None   Collection Time: 03/18/17  6:13 PM  Result Value Ref Range Status   Specimen Description BLOOD  R AC  Final   Special Requests BOTTLES DRAWN AEROBIC AND ANAEROBIC BCAV  Final   Culture NO GROWTH 5 DAYS  Final   Report Status 03/23/2017 FINAL  Final  Blood culture (routine x 2)     Status: None   Collection Time: 03/18/17  6:13 PM  Result Value Ref Range Status   Specimen Description BLOOD  L FOREARM  Final   Special Requests BOTTLES DRAWN AEROBIC AND ANAEROBIC BCHV  Final   Culture NO GROWTH 5 DAYS  Final   Report Status 03/23/2017 FINAL  Final    RADIOLOGY:  No results found.  EKG:   Orders placed or performed during the hospital encounter of 03/18/17  . ED EKG  . ED EKG  . EKG 12-Lead  . EKG 12-Lead  . EKG 12-Lead  . EKG 12-Lead      Management plans discussed with the patient, family and they are in agreement.  CODE STATUS:     Code Status Orders        Start     Ordered   03/18/17 2023  Full code  Continuous     03/18/17 2022    Code Status History    Date Active Date Inactive Code Status Order ID Comments User Context   03/01/2017 10:32 PM 03/05/2017  7:07 PM Full Code [id number]  Auburn Bilberry, MD Inpatient   04/17/2016  3:47 PM 04/20/2016  7:25 PM Full Code [id number]  Marguarite Arbour, MD Inpatient      TOTAL TIME TAKING CARE OF THIS PATIENT: 35 minutes.    Altamese Dilling M.D on 03/26/2017 at 12:25 PM  Between 7am to 6pm - Pager - [phone]  After 6pm go to [email or web address] - password [email or web address]  [facility]  Office  [phone]  CC: Primary care physician; Sherrie Mustache   Note: This dictation was prepared with Dragon dictation along with smaller phrase technology. Any transcriptional errors that result from this process are  unintentional.

## 2017-03-26 NOTE — Progress Notes (Signed)
[facility] [place], [place] 03/26/17  Subjective:  Creatinine significantly improved today. BUN down to 48 with a creatinine of 1.3. Patient sitting up in chair.   Objective:  Vital signs in last 24 hours:  Temp:  [97.8 F (36.6 C)-99.2 F (37.3 C)] 97.8 F (36.6 C) (05/19 0430) Pulse Rate:  [60-73] 68 (05/19 0430) Resp:  [16-18] 16 (05/19 0430) BP: (135-160)/(67-83) 135/67 (05/19 0430) SpO2:  [92 %-99 %] 99 % (05/19 0430)  Weight change:  Filed Weights   03/18/17 1332  Weight: (!) 157.9 kg (348 lb)    Intake/Output:    Intake/Output Summary (Last 24 hours) at 03/26/17 1112 Last data filed at 03/26/17 0951  Gross per 24 hour  Intake              240 ml  Output             1700 ml  Net            -1460 ml     Physical Exam: General: NAD, sitting up in chair  HEENT anicteric  Neck supple  Pulm/lungs Normal effort, basilar rales  CVS/Heart S1S2 no rubs irregular  Abdomen:  Obese, non tender  Extremities: + dependent edema  Neurologic: Alert, oriented, follows commands  Skin: No acute rashes          Basic Metabolic Panel:   Recent Labs Lab 03/22/17 0342 03/23/17 0515 03/24/17 0442 03/25/17 0423 03/26/17 0417  NA 135 136 141 139 143  K 5.2* 5.4* 5.0 4.9 4.9  CL 94* 98* 101 101 102  CO2 33* 32 33* 33* 35*  GLUCOSE 284* 281* 266* 206* 204*  BUN 69* 77* 74* 58* 48*  CREATININE 2.82* 2.50* 1.87* 1.62* 1.31*  CALCIUM 8.2* 8.3* 8.6* 8.7* 8.8*     CBC:  Recent Labs Lab 03/22/17 1027 03/23/17 0515 03/24/17 0442 03/25/17 0423 03/26/17 0417  WBC 8.8 6.4 6.6 6.9 7.9  HGB 12.2* 12.7* 12.1* 12.0* 12.2*  HCT 38.2* 39.4* 37.6* 37.1* 38.1*  MCV 89.8 91.2 89.9 88.8 88.8  PLT 145* 132* 135* 140* 138*     No results found for: HEPBSAG, HEPBSAB, HEPBIGM    Microbiology:  Recent Results (from the past 240 hour(s))  Blood culture (routine x 2)     Status: None   Collection Time: 03/18/17  6:13 PM  Result Value Ref Range  Status   Specimen Description BLOOD  R AC  Final   Special Requests BOTTLES DRAWN AEROBIC AND ANAEROBIC BCAV  Final   Culture NO GROWTH 5 DAYS  Final   Report Status 03/23/2017 FINAL  Final  Blood culture (routine x 2)     Status: None   Collection Time: 03/18/17  6:13 PM  Result Value Ref Range Status   Specimen Description BLOOD  L FOREARM  Final   Special Requests BOTTLES DRAWN AEROBIC AND ANAEROBIC BCHV  Final   Culture NO GROWTH 5 DAYS  Final   Report Status 03/23/2017 FINAL  Final    Coagulation Studies: No results for input(s): LABPROT, INR in the last 72 hours.  Urinalysis: No results for input(s): COLORURINE, LABSPEC, PHURINE, GLUCOSEU, HGBUR, BILIRUBINUR, KETONESUR, PROTEINUR, UROBILINOGEN, NITRITE, LEUKOCYTESUR in the last 72 hours.  Invalid input(s): APPERANCEUR    Imaging: No results found.   Medications:    . apixaban  5 mg Oral BID  . ferrous sulfate  325 mg Oral Q breakfast  . fluticasone  1 spray Each Nare BID  . insulin aspart  0-15 Units Subcutaneous TID AC & HS  . insulin aspart  4 Units Subcutaneous TID WC  . insulin glargine  16 Units Subcutaneous QHS  . ipratropium-albuterol  3 mL Nebulization TID  . loratadine  10 mg Oral Daily  . mouth rinse  15 mL Mouth Rinse BID  . metoprolol tartrate  12.5 mg Oral Q6H  . pantoprazole  40 mg Oral Daily  . predniSONE  50 mg Oral Q breakfast  . pregabalin  50 mg Oral BID  . rosuvastatin  10 mg Oral Daily  . tamsulosin  0.4 mg Oral Daily   docusate sodium, guaiFENesin-dextromethorphan, ipratropium-albuterol, metoprolol tartrate, ondansetron, traMADol  Assessment/ Plan:  67 y.o. male with medical problems of morbid obesity, chronic respiratory failure, chronic congestive heart failure, sleep apnea, type 2 diabetes for over 10 years, history of MI, history of kidney stone, atrial fibrillation February 2017 admitted for pneumonia  1.  ARF - likely ATN from hypotension and pneumonia Baseline Cr 1.02 from  03/04/2017 - Renal function now significantly improving. BUN down to 48 and creatinine down to 1.3. Continue to monitor renal function trend and follow-up as an outpatient.  2. LE edema - Possibly combination of lymphedema and 3rd spacing -  Lasix previously placed on hold. Renal function is improving. Consider restarting Lasix upon discharge.  3. Hyperkalemia - Potassium stable at 4.9. Continue patiromer for now.   4. Bacterial Pneumonia Patient previously on cefepime.  He has completed antibiotic therapy.      LOS: 8 Anjuli Gemmill 5/19/201811:12 AM  [facility] [place], [place] [phone]

## 2017-03-26 NOTE — Care Management Note (Addendum)
Case Management Note  Patient Details  Name: [name] MRN: [id number] Date of Birth: [birth year]  Subjective/Objective:    [facility] care for HH=PT and RN, and additional HH orders for Respiratory Care and Nurse Aide. A request for delivery of a home nebulizer machine was called to Brad at [facility].                  Action/Plan:   Expected Discharge Date:  03/26/17               Expected Discharge Plan:     In-House Referral:     Discharge planning Services     Post Acute Care Choice:    Choice offered to:     DME Arranged:    DME Agency:     HH Arranged:    HH Agency:     Status of Service:     If discussed at [employer] Meetings, dates discussed:    Additional Comments:  Danylle Ouk A, RN 03/26/2017, 2:13 PM

## 2017-03-28 ENCOUNTER — Other Ambulatory Visit: Payer: Self-pay | Admitting: *Deleted

## 2017-03-28 NOTE — Patient Outreach (Signed)
Attempt made to contact pt, follow up on referral received 5/17 from [place] [facility] to follow pt for transition of care- recent hospitalization 5/11-5/19 for sob, hypoxia, healthcare- associated pneumonia, COPD exacerbation).  HIPAA compliant voice message left with contact name and number.   Plan: if no response to voice message left, plan to follow up again tomorrow.    Shayne Alkenose M.   Lazarus Sudbury RN CCM [facility] Care Management  [phone]

## 2017-03-29 ENCOUNTER — Other Ambulatory Visit: Payer: Self-pay | Admitting: *Deleted

## 2017-03-29 NOTE — Patient Outreach (Signed)
Second attempt made to contact pt for transition of care, [facility] on [facility]- recent hospitalization 5/11-5/19 for sob, hypoxia, healthcare- associated pneumonia, COPD exacerbation.  HIPAA compliant voice message left with contact name and number.   Plan:  If no response to voice message left, plan to follow up again tomorrow.   Shayne Alkenose M.   [name] RN CCM [facility] Care [facility]  [phone]

## 2017-03-30 ENCOUNTER — Other Ambulatory Visit: Payer: Self-pay | Admitting: *Deleted

## 2017-03-30 NOTE — Patient Outreach (Signed)
Third phone call attempt made to contact pt, follow up on [facility]- recent hospitalization 5/11-5/19 for sob, hypoxia, healthcare-associated pneumonia, COPD exacerbation.   HIPAA compliant voice message left with contact name and number.   Plan: If no response to third attempt, per [facility] workflow unable to contact will be sent and if no response to letter in 10 business days will close case, notify MD.    Shayne Alkenose M.   Eriverto Byrnes RN CCM [facility] Care Management  [phone]

## 2017-03-31 ENCOUNTER — Encounter: Payer: Self-pay | Admitting: *Deleted

## 2017-03-31 NOTE — Patient Outreach (Signed)
3 phone call attempts made to  follow up on referral received 5/17 from [place]  RN [facility] - follow pt for transition of care, recent hospitalization 5/11-5/19 for sob, hypoxia, healthcare associated pneumonia, COPD exacerbation- last phone call 5/24- no responses back.  Per [facility] workflow- unable to contact letter to be sent to pt, if no response in 10 business days, to close case and notify MD.   Shayne Alkenose M.   [name] RN CCM [facility] Care Management  [phone]

## 2017-04-07 ENCOUNTER — Telehealth: Payer: Self-pay | Admitting: *Deleted

## 2017-04-07 NOTE — Telephone Encounter (Signed)
Currently admitted.

## 2017-04-07 NOTE — Telephone Encounter (Signed)
[facility].  Left voicemail message with appointment information and instructions to call back if any questions regarding discharge instructions or medications.

## 2017-04-07 NOTE — Telephone Encounter (Signed)
-----   Message from Coralee RudSabrina F Gilley sent at 04/07/2017 12:21 PM EDT ----- Regarding: tcm/ph 6/11 3:00 Dr. Mariah MillingGollan

## 2017-04-11 ENCOUNTER — Other Ambulatory Visit: Payer: Self-pay | Admitting: Surgery

## 2017-04-11 ENCOUNTER — Ambulatory Visit
Admission: RE | Admit: 2017-04-11 | Discharge: 2017-04-11 | Disposition: A | Discharge status: Home / Self Care | Payer: Medicare Other | Source: Ambulatory Visit | Attending: Surgery | Admitting: Surgery

## 2017-04-11 ENCOUNTER — Encounter: Payer: Medicare Other | Attending: Surgery | Admitting: Surgery

## 2017-04-11 DIAGNOSIS — R0902 Hypoxemia: Secondary | ICD-10-CM | POA: Insufficient documentation

## 2017-04-11 DIAGNOSIS — I252 Old myocardial infarction: Secondary | ICD-10-CM | POA: Insufficient documentation

## 2017-04-11 DIAGNOSIS — Z7902 Long term (current) use of antithrombotics/antiplatelets: Secondary | ICD-10-CM | POA: Insufficient documentation

## 2017-04-11 DIAGNOSIS — Z6841 Body Mass Index (BMI) 40.0 and over, adult: Secondary | ICD-10-CM | POA: Insufficient documentation

## 2017-04-11 DIAGNOSIS — R600 Localized edema: Secondary | ICD-10-CM | POA: Diagnosis present

## 2017-04-11 DIAGNOSIS — I82401 Acute embolism and thrombosis of unspecified deep veins of right lower extremity: Secondary | ICD-10-CM | POA: Diagnosis not present

## 2017-04-11 DIAGNOSIS — M1990 Unspecified osteoarthritis, unspecified site: Secondary | ICD-10-CM | POA: Insufficient documentation

## 2017-04-11 DIAGNOSIS — I509 Heart failure, unspecified: Secondary | ICD-10-CM | POA: Diagnosis not present

## 2017-04-11 DIAGNOSIS — I890 Lymphedema, not elsewhere classified: Secondary | ICD-10-CM | POA: Insufficient documentation

## 2017-04-11 DIAGNOSIS — G4730 Sleep apnea, unspecified: Secondary | ICD-10-CM | POA: Diagnosis not present

## 2017-04-11 DIAGNOSIS — E785 Hyperlipidemia, unspecified: Secondary | ICD-10-CM | POA: Insufficient documentation

## 2017-04-11 DIAGNOSIS — N170 Acute kidney failure with tubular necrosis: Secondary | ICD-10-CM | POA: Insufficient documentation

## 2017-04-11 DIAGNOSIS — Z794 Long term (current) use of insulin: Secondary | ICD-10-CM | POA: Diagnosis not present

## 2017-04-11 DIAGNOSIS — Z87891 Personal history of nicotine dependence: Secondary | ICD-10-CM | POA: Insufficient documentation

## 2017-04-11 DIAGNOSIS — J449 Chronic obstructive pulmonary disease, unspecified: Secondary | ICD-10-CM | POA: Diagnosis not present

## 2017-04-11 DIAGNOSIS — I481 Persistent atrial fibrillation: Secondary | ICD-10-CM | POA: Diagnosis not present

## 2017-04-11 DIAGNOSIS — Z885 Allergy status to narcotic agent status: Secondary | ICD-10-CM | POA: Insufficient documentation

## 2017-04-11 DIAGNOSIS — F419 Anxiety disorder, unspecified: Secondary | ICD-10-CM | POA: Diagnosis not present

## 2017-04-11 DIAGNOSIS — L97211 Non-pressure chronic ulcer of right calf limited to breakdown of skin: Secondary | ICD-10-CM | POA: Diagnosis not present

## 2017-04-11 DIAGNOSIS — E11622 Type 2 diabetes mellitus with other skin ulcer: Secondary | ICD-10-CM | POA: Diagnosis present

## 2017-04-11 DIAGNOSIS — I110 Hypertensive heart disease with heart failure: Secondary | ICD-10-CM | POA: Diagnosis not present

## 2017-04-11 DIAGNOSIS — I5022 Chronic systolic (congestive) heart failure: Secondary | ICD-10-CM | POA: Insufficient documentation

## 2017-04-11 DIAGNOSIS — I82491 Acute embolism and thrombosis of other specified deep vein of right lower extremity: Secondary | ICD-10-CM

## 2017-04-11 NOTE — Progress Notes (Signed)
[name] ([id number]) Visit Report for 04/11/2017 Chief Complaint Document Details Patient Name: [name], [name]. Date of Service: 04/11/2017 10:30 AM Medical Record Number: [id number] Patient Account Number: [id number] Date of Birth/Sex: Jun 26, 1951 ([name]) Treating RN: Phillis Haggis Primary Care Provider: Sherrie Mustache Other Clinician: Referring Provider: Sherrie Mustache Treating Provider/Extender: Rudene Re in Treatment: 0 Information Obtained from: Patient Chief Complaint Patient returns to the wound care center for reopened ulcer to: the right lower extremity with swelling for 1 week. Electronic Signature(s) Signed: 04/11/2017 11:03:08 AM By: Evlyn Kanner MD, FACS Entered By: Evlyn Kanner on 04/11/2017 11:03:08 ODYN, TURKO ([id number]) -------------------------------------------------------------------------------- HPI Details Patient Name: [name]. Date of Service: 04/11/2017 10:30 AM Medical Record Number: [id number] Patient Account Number: [id number] Date of Birth/Sex: Mar 18, 1951 ([name]) Treating RN: Phillis Haggis Primary Care Provider: Sherrie Mustache Other Clinician: Referring Provider: Sherrie Mustache Treating Provider/Extender: Rudene Re in Treatment: 0 History of Present Illness Location: right calf swelling and ulcer Quality: Patient reports experiencing a dull pain to affected area(s). Severity: Patient states wound are getting worse. Duration: Patient has had the wound for 1 week prior to seeking treatment at the wound center Timing: Pain in wound is Intermittent (comes and goes Context: The wound appeared gradually over time Associated Signs and Symptoms: Patient reports having difficulty standing for long periods. HPI Description: 66 year old gentleman who has right lower extremity swelling with ulceration for over a week has had application of compression as per his PCPs orders. He was recently admitted  between 03/18/2017 to 03/26/2017 for shortness of breath, hypoxia and HCAP. He is also known to have acute tubular necrosis, acute renal failure, hypoxia and persistent atrial fibrillation. He was given cefepime for his pneumonia and was also on by mouth prednisone. His most recent hemoglobin A1c was 6.6% He is on a Eliquis. He had a DVT study done on April 25 which was negative but not conclusive Addendum: the ultrasound tech called to say that his DVT study was negative. ====== Old notes: This 66 year old gentleman comes to see [place] for weeping of an ulcer on the left lower extremity. Other comorbidities include morbid obesity, hyperlipidemia, diabetes mellitus type 2, COPD, hypertension, congestive heart failure and prostate problems. He was here in September of last year and was discharged after appropriate treatment for his condition and we had asked him to get vascular studies and he does say that he has had them done but does not know the results. He wears his compression stockings on and off but recently has not been wearing them because they get too tight. We have just obtained his results from the [place] [facility] -- he was last seen there in October 2016 and at that stage his superficial thrombophlebitis on the left lower extremity had resolved and there was no venous reflux or venous disease seen on the right leg on his previous study. He was asked to continue with Plavix and elevation and exercise with compression stockings. No venous intervention would benefit him. We have also reviewed the reflux study done on September 15 for both lower extremities which showed no evidence of right lower extremity deep with thrombosis or superficial thrombophlebitis. No evidence of left lower extremity deep vein thrombosis but there was evidence of left lower extremity superficial thrombophlebitis. No incompetence of the greater small saphenous veins are noted  bilaterally. 12/10/15; this is a patient who came here last week with an ulcer on his left lateral lower extremity and  weeping edema. We have reviewed his venous studies which don't suggest the benefit of venous interventions. His ABI in the last this 1.07. He removed the Unna boot that we put on last week yesterday and there is considerable swelling in the left leg Gauna, Alexx R. ([id number]) 12/17/15 the patient returns to clinic today with the wounds on the left lateral calf completely resolved. It would appear that during a visit to this clinic last time he did have graded pressure stockings prescribed however he cannot get these on. After some discussion we decided to order him juzzo stockings ====== Electronic Signature(s) Signed: 04/11/2017 12:35:45 PM By: Evlyn Kanner MD, FACS Previous Signature: 04/11/2017 11:07:01 AM Version By: Evlyn Kanner MD, FACS Previous Signature: 04/11/2017 10:16:56 AM Version By: Evlyn Kanner MD, FACS Previous Signature: 04/11/2017 10:14:37 AM Version By: Evlyn Kanner MD, FACS Entered By: Evlyn Kanner on 04/11/2017 12:35:44 Ruddick, [name] ([id number]) -------------------------------------------------------------------------------- Physical Exam Details Patient Name: [name]. Date of Service: 04/11/2017 10:30 AM Medical Record Number: [id number] Patient Account Number: [id number] Date of Birth/Sex: 08-13-1951 ([name]) Treating RN: Phillis Haggis Primary Care Provider: Sherrie Mustache Other Clinician: Referring Provider: Sherrie Mustache Treating Provider/Extender: Rudene Re in Treatment: 0 Constitutional . Pulse regular. Respirations normal and unlabored. Afebrile. . Eyes Nonicteric. Reactive to light. Ears, Nose, Mouth, and Throat Lips, teeth, and gums WNL.Marland Kitchen Moist mucosa without lesions. Neck supple and nontender. No palpable supraclavicular or cervical adenopathy. Normal sized without goiter. Respiratory WNL. No  retractions.. Cardiovascular Pedal Pulses WNL. ABI was noncompressible bilaterally. he has got significant lymphedema of the right lower extremity and his right lower extremity is 5 cm large in his left lower extremity. Chest Breasts symmetical and no nipple discharge.. Breast tissue WNL, no masses, lumps, or tenderness.. Lymphatic No adneopathy. No adenopathy. No adenopathy. Musculoskeletal Adexa without tenderness or enlargement.. Digits and nails w/o clubbing, cyanosis, infection, petechiae, ischemia, or inflammatory conditions.. Integumentary (Hair, Skin) No suspicious lesions. No crepitus or fluctuance. No peri-wound warmth or erythema. No masses.Marland Kitchen Psychiatric Judgement and insight Intact.. No evidence of depression, anxiety, or agitation.. Notes the patient has a significant lymphedema bilaterally right is 5 cm larger than the left. He has got a superficial ulceration of his right lower extremity and other than that I cannot rule out a DVT Electronic Signature(s) Signed: 04/11/2017 11:08:03 AM By: Evlyn Kanner MD, FACS Entered By: Evlyn Kanner on 04/11/2017 11:08:03 DRAGAN, TAMBURRINO ([id number]) -------------------------------------------------------------------------------- Physician Orders Details Patient Name: DONTRAIL, BLACKWELL. Date of Service: 04/11/2017 10:30 AM Medical Record Number: [id number] Patient Account Number: [id number] Date of Birth/Sex: [birth year] ([name]) Treating RN: Ashok Cordia, Debi Primary Care Provider: Sherrie Mustache Other Clinician: Referring Provider: Sherrie Mustache Treating Provider/Extender: Rudene Re in Treatment: 0 Verbal / Phone Orders: Yes ClinicianAshok Cordia, Debi Read Back and Verified: Yes Diagnosis Coding Wound Cleansing Wound #5 Left,Anterior Lower Leg o Clean wound with Normal Saline. o Cleanse wound with mild soap and water Anesthetic Wound #5 Left,Anterior Lower Leg o Topical Lidocaine 4% cream applied to wound  bed prior to debridement - for clinic use Skin Barriers/Peri-Wound Care Wound #5 Left,Anterior Lower Leg o Moisturizing lotion - on leg not on wound Primary Wound Dressing Wound #5 Left,Anterior Lower Leg o Aquacel Ag Secondary Dressing Wound #5 Left,Anterior Lower Leg o ABD pad o Dry Gauze Dressing Change Frequency Wound #5 Left,Anterior Lower Leg o Other: - change only if wrap falls or slips down Follow-up Appointments Wound #5 Left,Anterior Lower Leg o Return Appointment in  1 week. Edema Control Wound #5 Left,Anterior Lower Leg o 3 Layer Compression System - Right Lower Extremity - unna to anchor wrap 3cm from toes and 3cm from knee o Elevate legs to the level of the heart and pump ankles as often as possible Mele, Zeddie R. ([id number]) Off-Loading Wound #5 Left,Anterior Lower Leg o Turn and reposition every 2 hours Additional Orders / Instructions Wound #5 Left,Anterior Lower Leg o Increase protein intake. Home Health Wound #5 Left,Anterior Lower Leg o [facility] o Home Health Nurse may visit PRN to address patientos wound care needs. o FACE TO FACE ENCOUNTER: MEDICARE and MEDICAID PATIENTS: I certify that this patient is under my care and that I had a face-to-face encounter that meets the physician face-to-face encounter requirements with this patient on this date. The encounter with the patient was in whole or in part for the following MEDICAL CONDITION: (primary reason for Home Healthcare) MEDICAL NECESSITY: I certify, that based on my findings, NURSING services are a medically necessary home health service. HOME BOUND STATUS: I certify that my clinical findings support that this patient is homebound (i.e., Due to illness or injury, pt requires aid of supportive devices such as crutches, cane, wheelchairs, walkers, the use of special transportation or the assistance of another person to leave their place of  residence. There is a normal inability to leave the home and doing so requires considerable and taxing effort. Other absences are for medical reasons / religious services and are infrequent or of short duration when for other reasons). o If current dressing causes regression in wound condition, may D/C ordered dressing product/s and apply Normal Saline Moist Dressing daily until next Wound Healing Center / Other MD appointment. Notify Wound Healing Center of regression in wound condition at [phone]. o Please direct any NON-WOUND related issues/requests for orders to patient's Primary Care Physician Services and Therapies o Venous Studies -Unilateral - Right leg DVT study Electronic Signature(s) Signed: 04/11/2017 3:36:12 PM By: Evlyn KannerBritto, Rihaan Barrack MD, FACS Signed: 04/11/2017 4:05:23 PM By: Alejandro MullingPinkerton, Debra Entered By: Alejandro MullingPinkerton, Debra on 04/11/2017 12:04:13 Towell, Ab Elvera Lennox. ([id number]) -------------------------------------------------------------------------------- Problem List Details Patient Name: Marisa HuaWOODS, Taariq R. Date of Service: 04/11/2017 10:30 AM Medical Record Number: [id number] Patient Account Number: [id number] Date of Birth/Sex: 11-11-1950 51(65 y.o. Male) Treating RN: Phillis HaggisPinkerton, Debi Primary Care Provider: Sherrie MustacheJADALI, FAYEGH Other Clinician: Referring Provider: Sherrie MustacheJADALI, FAYEGH Treating Provider/Extender: Rudene ReBritto, Delyla Sandeen Weeks in Treatment: 0 Active Problems ICD-10 Encounter Code Description Active Date Diagnosis E11.622 Type 2 diabetes mellitus with other skin ulcer 04/11/2017 Yes E08.59 Diabetes mellitus due to underlying condition with other 04/11/2017 Yes circulatory complications I82.401 Acute embolism and thrombosis of unspecified deep veins 04/11/2017 Yes of right lower extremity 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 ([id number]) -------------------------------------------------------------------------------- Progress Note Details Patient Name: Marisa HuaWOODS, Nai R. Date of Service: 04/11/2017 10:30 AM Medical Record Number: [id number] Patient Account Number: [id number] Date of Birth/Sex: 11-11-1950 [age] y.o. Male) Treating RN: Phillis HaggisPinkerton, Debi Primary Care Provider: Sherrie MustacheJADALI, FAYEGH Other Clinician: Referring Provider: Sherrie MustacheJADALI, FAYEGH Treating Provider/Extender: Rudene ReBritto, Ashiyah Pavlak Weeks in Treatment: 0 Subjective Chief Complaint Information obtained from Patient Patient returns to the wound care center for reopened ulcer to: the right lower extremity with swelling  for 1 week. History of Present Illness (HPI) The following HPI elements were documented for the patient's wound: Location: right calf swelling and ulcer Quality: Patient reports experiencing a dull pain to affected area(s). Severity: Patient states wound are getting worse. Duration: Patient has had the wound for 1 week prior to seeking treatment at the wound center Timing: Pain in wound is Intermittent (comes and goes Context: The wound appeared gradually over time Associated Signs and Symptoms: Patient reports having difficulty standing for long periods. 65 year old gentleman who has right lower extremity swelling with ulceration for over a week has had application of compression as per his PCPs orders. He was recently admitted between 03/18/2017 to 03/26/2017 for shortness of breath, hypoxia and HCAP. He is also known to have acute tubular necrosis, acute renal failure, hypoxia and persistent atrial fibrillation. He was given cefepime for his pneumonia and was also on by mouth prednisone. His most recent hemoglobin A1c was 6.6% He is on a Eliquis. He had  a DVT study done on April 25 which was negative but not conclusive Addendum: the ultrasound tech called to say that his DVT study was negative. ====== Old notes: This 66 year old gentleman comes to see [place] for weeping of an ulcer on the left lower extremity. Other comorbidities include morbid obesity, hyperlipidemia, diabetes mellitus type 2, COPD, hypertension, congestive heart failure and prostate problems. He was here in September of last year and was discharged after appropriate treatment for his condition and we had asked him to get vascular studies and he does say that he has had them done but does not know the results. He wears his compression stockings on and off but recently has not been wearing them because they get too tight. We have just obtained his results from the [place] [facility] -- he was last seen there in October 2016 and at that stage his superficial thrombophlebitis on the left lower extremity had resolved and there was no venous reflux or venous disease seen on the right leg on his previous study. He was asked to AMORI, COLOMB ([id number]) continue with Plavix and elevation and exercise with compression stockings. No venous intervention would benefit him. We have also reviewed the reflux study done on September 15 for both lower extremities which showed no evidence of right lower extremity deep with thrombosis or superficial thrombophlebitis. No evidence of left lower extremity deep vein thrombosis but there was evidence of left lower extremity superficial thrombophlebitis. No incompetence of the greater small saphenous veins are noted bilaterally. 12/10/15; this is a patient who came here last week with an ulcer on his left lateral lower extremity and weeping edema. We have reviewed his venous studies which don't suggest the benefit of venous interventions. His ABI in the last this 1.07. He removed the Unna boot that we put on last week yesterday and  there is considerable swelling in the left leg 12/17/15 the patient returns to clinic today with the wounds on the left lateral calf completely resolved. It would appear that during a visit to this clinic last time he did have graded pressure stockings prescribed however he cannot get these on. After some discussion we decided to order him juzzo stockings ====== Wound History Patient presents with 1 open wound that has been present for approximately one week. Patient has been treating wound in the following manner: wrap. Laboratory tests have not been performed in the last month. Patient reportedly has not tested positive for an antibiotic resistant organism. Patient  reportedly has not tested positive for osteomyelitis. Patient reportedly has had testing performed to evaluate circulation in the legs. Patient experiences the following problems associated with their wounds: swelling. Patient History Information obtained from Patient. Allergies morphine (Reaction: hallucinations), metformin Family History Cancer - Father, Heart Disease - Mother, Hypertension - Mother, No family history of Diabetes, Hereditary Spherocytosis, Kidney Disease, Lung Disease, Seizures, Stroke, Thyroid Problems, Tuberculosis. Social History Former smoker - quit 30 years ago, Marital Status - Married, Alcohol Use - Never, Drug Use - No History, Caffeine Use - Daily. Medical History Respiratory Patient has history of Asthma Endocrine Patient has history of Type II Diabetes Patient is treated with Insulin. Blood sugar is tested. 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 ([id number]) Constitutional Symptoms (General Health) The patient has no complaints or symptoms. Eyes The patient has no complaints or symptoms. Cardiovascular hyperlipidemia Gastrointestinal GERD Immunological The patient has no complaints or  symptoms. Integumentary (Skin) Complains or has symptoms of Wounds. Musculoskeletal SCOLIOSIS Neurologic The patient has no complaints or symptoms. Oncologic The patient has no complaints or symptoms. Psychiatric The patient has no complaints or symptoms. Objective Constitutional Pulse regular. Respirations normal and unlabored. Afebrile. Vitals Time Taken: 10:08 AM, Height: 68 in, Source: Stated, Weight: 346 lbs, Source: Measured, BMI: 52.6, Temperature: 97.9 F, Pulse: 85 bpm, Respiratory Rate: 20 breaths/min, Blood Pressure: 126/71 mmHg. Eyes Nonicteric. Reactive to light. Ears, Nose, Mouth, and Throat Lips, teeth, and gums WNL.Marland Kitchen Moist mucosa without lesions. Neck supple and nontender. No palpable supraclavicular or cervical adenopathy. Normal sized without goiter. Respiratory WNL. No retractions.. Cardiovascular Pedal Pulses WNL. ABI was noncompressible bilaterally. he has got significant lymphedema of the right lower extremity and his right lower extremity is 5 cm large in his left lower extremity. HUMBERTO, ADDO ([id number]) Chest Breasts symmetical and no nipple discharge.. Breast tissue WNL, no masses, lumps, or tenderness.. Lymphatic No adneopathy. No adenopathy. No adenopathy. Musculoskeletal Adexa without tenderness or enlargement.. Digits and nails w/o clubbing, cyanosis, infection, petechiae, ischemia, or inflammatory conditions.Marland Kitchen Psychiatric Judgement and insight Intact.. No evidence of depression, anxiety, or agitation.. General Notes: the patient has a significant lymphedema bilaterally right is 5 cm larger than the left. He has got a superficial ulceration of his right lower extremity and other than that I cannot rule out a DVT Integumentary (Hair, Skin) No suspicious lesions. No crepitus or fluctuance. No peri-wound warmth or erythema. No masses.. Wound #5 status is Open. Original cause of wound was Gradually Appeared. The wound is located on  the Left,Anterior Lower Leg. The wound measures 7.1cm length x 7.5cm width x 0.1cm depth; 41.822cm^2 area and 4.182cm^3 volume. There is no tunneling or undermining noted. There is a large amount of serosanguineous drainage noted. The wound margin is distinct with the outline attached to the wound base. There is large (67-100%) red granulation within the wound bed. There is no necrotic tissue within the wound bed. Periwound temperature was noted as No Abnormality. The periwound has tenderness on palpation. Assessment Active Problems ICD-10 E11.622 - Type 2 diabetes mellitus with other skin ulcer E08.59 - Diabetes mellitus due to underlying condition with other circulatory complications I82.401 - Acute embolism and thrombosis of unspecified deep veins of right lower extremity L97.211 - Non-pressure chronic ulcer of right calf limited to breakdown of skin I89.0 - Lymphedema, not elsewhere classified I50.22 - Chronic systolic (congestive) heart failure DEKENDRICK, UZELAC ([id number]) 66 year old gentleman with  chronic pulmonary condition and also CHF comes with a large swelling of his right lower extremity which has increased significantly over the last week. After careful review I have recommended: 1. Silver alginate and a 3 layer Profore wrap to the right lower extremity 2. DVT study of both lower extremities to be done ASAP 3. Elevation and exercise has been discussed with him 4. to take his diuretics, cardiac medications, and a review by his nephrologist 5. regular visits to the wound center Plan Wound Cleansing: Wound #5 Left,Anterior Lower Leg: Clean wound with Normal Saline. Cleanse wound with mild soap and water Anesthetic: Wound #5 Left,Anterior Lower Leg: Topical Lidocaine 4% cream applied to wound bed prior to debridement - for clinic use Skin Barriers/Peri-Wound Care: Wound #5 Left,Anterior Lower Leg: Moisturizing lotion - on leg not on wound Primary Wound Dressing: Wound #5  Left,Anterior Lower Leg: Aquacel Ag Secondary Dressing: Wound #5 Left,Anterior Lower Leg: ABD pad Dry Gauze Dressing Change Frequency: Wound #5 Left,Anterior Lower Leg: Other: - change only if wrap falls or slips down Follow-up Appointments: Wound #5 Left,Anterior Lower Leg: Return Appointment in 1 week. Edema Control: Wound #5 Left,Anterior Lower Leg: 3 Layer Compression System - Right Lower Extremity - unna to anchor wrap 3cm from toes and 3cm from knee Elevate legs to the level of the heart and pump ankles as often as possible Off-Loading: Wound #5 Left,Anterior Lower Leg: Turn and reposition every 2 hours Additional Orders / Instructions: Wound #5 Left,Anterior Lower Leg: Increase protein intake. Home Health: Wound #5 Left,Anterior Lower Leg: Continue [facility] - [facility] Nurse may visit PRN to address patient s wound care needs. ANSHUL, MEDDINGS ([id number]) FACE TO FACE ENCOUNTER: MEDICARE and MEDICAID PATIENTS: I certify that this patient is under my care and that I had a face-to-face encounter that meets the physician face-to-face encounter requirements with this patient on this date. The encounter with the patient was in whole or in part for the following MEDICAL CONDITION: (primary reason for Home Healthcare) MEDICAL NECESSITY: I certify, that based on my findings, NURSING services are a medically necessary home health service. HOME BOUND STATUS: I certify that my clinical findings support that this patient is homebound (i.e., Due to illness or injury, pt requires aid of supportive devices such as crutches, cane, wheelchairs, walkers, the use of special transportation or the assistance of another person to leave their place of residence. There is a normal inability to leave the home and doing so requires considerable and taxing effort. Other absences are for medical reasons / religious services and are infrequent or of short duration when for  other reasons). If current dressing causes regression in wound condition, may D/C ordered dressing product/s and apply Normal Saline Moist Dressing daily until next Wound Healing Center / Other MD appointment. Notify Wound Healing Center of regression in wound condition at [phone]. Please direct any NON-WOUND related issues/requests for orders to patient's Primary Care Physician Services and Therapies ordered were: Venous Studies -Unilateral - Right leg DVT study 66 year old gentleman with chronic pulmonary condition and also CHF comes with a large swelling of his right lower extremity which has increased significantly over the last week. After careful review I have recommended: 1. Silver alginate and a 3 layer Profore wrap to the right lower extremity 2. DVT study of both lower extremities to be done ASAP 3. Elevation and exercise has been discussed with him 4. to take his diuretics, cardiac medications, and a review by his  nephrologist 5. regular visits to the wound center Notes Addendum: the ultrasound tech called to say that his DVT study was negative. I'm also informed by the clinical nurse that the patient wishes to see Dr. Leanord Hawking for follow-up. This is fine by me Electronic Signature(s) Signed: 04/11/2017 12:37:02 PM By: Evlyn Kanner MD, FACS Previous Signature: 04/11/2017 11:10:11 AM Version By: Evlyn Kanner MD, FACS Entered By: Evlyn Kanner on 04/11/2017 12:37:02 JILL, RUPPE ([id number]) -------------------------------------------------------------------------------- ROS/PFSH Details Patient Name: LABRADFORD, SCHNITKER. Date of Service: 04/11/2017 10:30 AM Medical Record Number: [id number] Patient Account Number: [id number] Date of Birth/Sex: 12/21/1950 ([name]) Treating RN: Ashok Cordia, Debi Primary Care Provider: Sherrie Mustache Other Clinician: Referring Provider: Sherrie Mustache Treating Provider/Extender: Rudene Re in Treatment: 0 Information Obtained  From Patient Wound History Do you currently have one or more open woundso Yes How many open wounds do you currently haveo 1 Approximately how long have you had your woundso one week How have you been treating your wound(s) until nowo wrap Has your wound(s) ever healed and then re-openedo No Have you had any lab work done in the past montho No Have you tested positive for an antibiotic resistant organism (MRSA, VRE)o No Have you tested positive for osteomyelitis (bone infection)o No Have you had any tests for circulation on your legso Yes Where was the test doneo [facility] Have you had other problems associated with your woundso Swelling Integumentary (Skin) Complaints and Symptoms: Positive for: Wounds Medical History: Negative for: History of Burn; History of pressure wounds Constitutional Symptoms (General Health) Complaints and Symptoms: No Complaints or Symptoms Eyes Complaints and Symptoms: No Complaints or Symptoms Ear/Nose/Mouth/Throat Medical History: Positive for: Chronic sinus problems/congestion Respiratory Medical History: Positive for: Asthma; Chronic Obstructive Pulmonary Disease (COPD); Sleep Apnea - does not wear Salberg, Damauri R. ([id number]) CPAP Cardiovascular Complaints and Symptoms: Review of System Notes: hyperlipidemia Medical History: Positive for: Congestive Heart Failure; Hypertension; Myocardial Infarction - 1990's Negative for: Arrhythmia Past Medical History Notes: Heart murmur; Heart cath at the time of MI Gastrointestinal Complaints and Symptoms: Review of System Notes: GERD Medical History: Negative for: Cirrhosis ; Colitis; Crohnos; Hepatitis A; Hepatitis B Endocrine Medical History: Positive for: Type II Diabetes Time with diabetes: 15 years Treated with: Insulin Blood sugar tested every day: Yes Tested : Immunological Complaints and Symptoms: No Complaints or Symptoms Medical History: Negative for: Lupus Erythematosus; Raynaudos;  Scleroderma Musculoskeletal Complaints and Symptoms: Review of System Notes: SCOLIOSIS Medical History: Positive for: Osteoarthritis Neurologic Guier, Gianpaolo R. ([id number]) Complaints and Symptoms: No Complaints or Symptoms Medical History: Negative for: Dementia; Neuropathy; Quadriplegia; Paraplegia Oncologic Complaints and Symptoms: No Complaints or Symptoms Medical History: Negative for: Received Chemotherapy; Received Radiation Psychiatric Complaints and Symptoms: No Complaints or Symptoms Medical History: Positive for: Confinement Anxiety HBO Extended History Items Ear/Nose/Mouth/Throat: Chronic sinus problems/congestion Immunizations Pneumococcal Vaccine: Received Pneumococcal Vaccination: No Family and Social History Cancer: Yes - Father; Diabetes: No; Heart Disease: Yes - Mother; Hereditary Spherocytosis: No; Hypertension: Yes - Mother; Kidney Disease: No; Lung Disease: No; Seizures: No; Stroke: No; Thyroid Problems: No; Tuberculosis: No; Former smoker - quit 30 years ago; Marital Status - Married; Alcohol Use: Never; Drug Use: No History; Caffeine Use: Daily; Financial Concerns: No; Food, Clothing or Shelter Needs: No; Support System Lacking: No; Transportation Concerns: No; Advanced Directives: Yes (Not Provided); Patient does not want information on Advanced Directives; Do not resuscitate: No; Living Will: Yes (Not Provided); Medical Power of Attorney: Yes - Sevyn Paredez- wife (Not Provided) Physician Affirmation I  have reviewed and agree with the above information. Electronic Signature(s) Signed: 04/11/2017 3:36:12 PM By: Evlyn Kanner MD, FACS Signed: 04/11/2017 4:05:23 PM By: Alejandro Mulling Entered By: Evlyn Kanner on 04/11/2017 10:41:30 Luis, [name] ([id number]) -------------------------------------------------------------------------------- SuperBill Details Patient Name: DAXTON, NYDAM. Date of Service: 04/11/2017 Medical Record Number:  [id number] Patient Account Number: [id number] Date of Birth/Sex: 25-Jul-1951 ([name]) Treating RN: Phillis Haggis Primary Care Provider: Sherrie Mustache Other Clinician: Referring Provider: Sherrie Mustache Treating Provider/Extender: Rudene Re in Treatment: 0 Diagnosis Coding ICD-10 Codes Code Description E11.622 Type 2 diabetes mellitus with other skin ulcer E08.59 Diabetes mellitus due to underlying condition with other circulatory complications I82.401 Acute embolism and thrombosis of unspecified deep veins of right lower extremity L97.211 Non-pressure chronic ulcer of right calf limited to breakdown of skin I89.0 Lymphedema, not elsewhere classified I50.22 Chronic systolic (congestive) heart failure Facility Procedures CPT4: Description Modifier Quantity Code [id number] 99215 - WOUND CARE VISIT-LEV 5 EST PT 1 CPT4: 30865784 (Facility Use Only) [id number] - APPLY MULTLAY COMPRS LWR RT 1 LEG Physician Procedures CPT4: Description Modifier Quantity Code [id number] 99214 - WC PHYS LEVEL 4 - EST PT 1 ICD-10 Description Diagnosis E11.622 Type 2 diabetes mellitus with other skin ulcer E08.59 Diabetes mellitus due to underlying condition with other circulatory  complications I82.401 Acute embolism and thrombosis of unspecified deep veins of right lower extremity L97.211 Non-pressure chronic ulcer of right calf limited to breakdown of skin Electronic Signature(s) Signed: 04/11/2017 12:37:22 PM By: Evlyn Kanner MD, FACS Previous Signature: 04/11/2017 11:10:27 AM Version By: Evlyn Kanner MD, FACS Entered By: Evlyn Kanner on 04/11/2017 12:37:21 [place], [name] ([id number])

## 2017-04-11 NOTE — Progress Notes (Signed)
[name], Ernest ([id number]) Visit Report for 04/11/2017 Allergy List Details Patient Name: [name], [name]. Date of Service: 04/11/2017 10:30 AM Medical Record Number: [id number] Patient Account Number: [id number] Date of Birth/Sex: 1950/11/11 ([name]) Treating RN: [name] Primary Care Sadae Arrazola: [name] Other Clinician: Referring [name]: [name] [name] [name]/[name]: [name] in Treatment: 0 Allergies Active Allergies morphine Reaction: hallucinations metformin Allergy Notes Electronic Signature(s) Signed: 04/11/2017 4:05:23 PM By: [name] Entered By: [name] on 04/11/2017 10:09:56 [name], [name] ([id number]) -------------------------------------------------------------------------------- Arrival Information Details Patient Name: Ernest, [name]. Date of Service: 04/11/2017 10:30 AM Medical Record Number: [id number] Patient Account Number: [id number] Date of Birth/Sex: Jul 15, 1951 ([name]) Treating RN: [name] Primary Care [name]: [name] Other Clinician: Referring [name]: [name] [name] [name]/[name]: [name] in Treatment: 0 Visit Information Patient Arrived: Wheel Chair Arrival Time: 10:02 Transfer Assistance: EasyPivot Patient Lift Patient Identification Verified: Yes Secondary Verification Process Yes Completed: Patient Requires Transmission- No Based Precautions: Patient Has Alerts: Yes Patient Alerts: Patient on Blood Thinner DM II Eliquis R ABI non- compressible History Since Last Visit All ordered tests and consults were completed: No Added or deleted any medications: No Any new allergies or adverse reactions: No Had a fall or experienced change in activities of daily living that may affect risk of falls: No Signs or symptoms of abuse/neglect since last visito No Hospitalized since last visit: No Electronic Signature(s) Signed: 04/11/2017 4:05:23  PM By: [name] Entered By: [name] on 04/11/2017 10:28:02 [name], [name] ([id number]) -------------------------------------------------------------------------------- Clinic Level of Care Assessment Details Patient Name: Ernest Hua. Date of Service: 04/11/2017 10:30 AM Medical Record Number: [id number] Patient Account Number: [id number] Date of Birth/Sex: 07-06-1951 ([name]) Treating RN: [name] Primary Care [name]: [name] Other Clinician: Referring [name]: [name] [name] [name]/[name]: [name] in Treatment: 0 Clinic Level of Care Assessment Items TOOL 2 Quantity Score X - Use when only an EandM is performed on the INITIAL visit 1 0 ASSESSMENTS - Nursing Assessment / Reassessment X - General Physical Exam (combine w/ comprehensive assessment (listed just 1 20 below) when performed on new pt. evals) X - Comprehensive Assessment (HX, ROS, Risk Assessments, Wounds Hx, etc.) 1 25 ASSESSMENTS - Wound and Skin Assessment / Reassessment X - Simple Wound Assessment / Reassessment - one wound 1 5 []  - Complex Wound Assessment / Reassessment - multiple wounds 0 []  - Dermatologic / Skin Assessment (not related to wound area) 0 ASSESSMENTS - Ostomy and/or Continence Assessment and Care []  - Incontinence Assessment and Management 0 []  - Ostomy Care Assessment and Management (repouching, etc.) 0 PROCESS - Coordination of Care []  - Simple Patient / Family Education for ongoing care 0 X - Complex (extensive) Patient / Family Education for ongoing care 1 20 X - Staff obtains Chiropractor, Records, Test Results / Process Orders 1 10 X - Staff telephones HHA, Nursing Homes / Clarify orders / etc 1 10 []  - Routine Transfer to another Facility (non-emergent condition) 0 []  - Routine Hospital Admission (non-emergent condition) 0 X - New Admissions / Manufacturing engineer / Ordering NPWT, Apligraf, etc. 1 15 []  - Emergency Hospital  Admission (emergent condition) 0 X - Simple Discharge Coordination 1 10 [name], Ernest R. ([id number]) []  - Complex (extensive) Discharge Coordination 0 PROCESS - Special Needs []  - Pediatric / Minor Patient Management 0 []  - Isolation Patient Management 0 []  - Hearing / Language / Visual special needs 0 []  - Assessment  of Community assistance (transportation, D/C planning, etc.) 0 []  - Additional assistance / Altered mentation 0 []  - Support Surface(s) Assessment (bed, cushion, seat, etc.) 0 INTERVENTIONS - Wound Cleansing / Measurement X - Wound Imaging (photographs - any number of wounds) 1 5 []  - Wound Tracing (instead of photographs) 0 X - Simple Wound Measurement - one wound 1 5 []  - Complex Wound Measurement - multiple wounds 0 X - Simple Wound Cleansing - one wound 1 5 []  - Complex Wound Cleansing - multiple wounds 0 INTERVENTIONS - Wound Dressings []  - Small Wound Dressing one or multiple wounds 0 []  - Medium Wound Dressing one or multiple wounds 0 X - Large Wound Dressing one or multiple wounds 1 20 []  - Application of Medications - injection 0 INTERVENTIONS - Miscellaneous []  - External ear exam 0 []  - Specimen Collection (cultures, biopsies, blood, body fluids, etc.) 0 []  - Specimen(s) / Culture(s) sent or taken to Lab for analysis 0 []  - Patient Transfer (multiple staff / Nurse, adult / Similar devices) 0 []  - Simple Staple / Suture removal (25 or less) 0 []  - Complex Staple / Suture removal (26 or more) 0 [name], Ernest R. ([id number]) []  - Hypo / Hyperglycemic Management (close monitor of Blood Glucose) 0 X - Ankle / Brachial Index (ABI) - do not check if billed separately 1 15 Has the patient been seen at the hospital within the last three years: Yes Total Score: 165 Level Of Care: New/Established - Level 5 Electronic Signature(s) Signed: 04/11/2017 4:05:23 PM By: [name] Entered By: [name] on 04/11/2017 12:06:23 Gellis, [name]  ([id number]) -------------------------------------------------------------------------------- Encounter Discharge Information Details Patient Name: TIRRELL, BUCHBERGER R. Date of Service: 04/11/2017 10:30 AM Medical Record Number: [id number] Patient Account Number: [id number] Date of Birth/Sex: 15-Sep-1951 ([name]) Treating RN: [name] Primary Care Solstice Lastinger: [name] Other Clinician: Referring Alissa Pharr: [name] [name] [place]/[name]: [name] in Treatment: 0 Encounter Discharge Information Items Discharge Pain Level: 4 Discharge Condition: Stable Ambulatory Status: Wheelchair Discharge Destination: Home Transportation: Private Auto brother in Accompanied By: law Schedule Follow-up Appointment: Yes Medication Reconciliation completed No and provided to Patient/Care Aviraj Kentner: Patient Clinical Summary of Care: Declined Electronic Signature(s) Signed: 04/11/2017 11:34:14 AM By: Francie Massing Entered By: Francie Massing on 04/11/2017 11:34:14 Bolz, [name] ([id number]) -------------------------------------------------------------------------------- General Visit Notes Details Patient Name: Ernest Hua. Date of Service: 04/11/2017 10:30 AM Medical Record Number: [id number] Patient Account Number: [id number] Date of Birth/Sex: 12-Jul-1951 ([name]) Treating RN: Huel Coventry Primary Care Oneta Sigman: [name] Other Clinician: Referring Chrisoula Zegarra: [name] [name] Shellsea Borunda/[name]: [name] in Treatment: 0 Notes Had conversation with Debi, RN [name] the patient who was unsure of wrapping the patient and sending him over to the hospital for a DVT study. I went to confirm orders with Dr. Meyer Russel. Dr. Meyer Russel is sending the patient over to the hospital for a DVT study due to patient having a 5cm difference in leg sizes. Dr. Meyer Russel states there is a low chance for DVT. He wants the patient wrapped in a 3 layer compression wrap. Dr.  Meyer Russel states the testing can be done through the wrap. Once confirmed, relayed the information to Debi. Electronic Signature(s) Signed: 04/11/2017 11:24:49 AM By: Elliot Gurney, BSN, RN, CWS, Kim RN, BSN Entered By: Elliot Gurney, BSN, RN, CWS, Kim on 04/11/2017 11:24:49 Cambre, [name] ([id number]) -------------------------------------------------------------------------------- Lower Extremity Assessment Details Patient Name: [name], BRAHM. Date of Service: 04/11/2017 10:30 AM Medical Record Number: [id number] Patient Account Number:  [id number] Date of Birth/Sex: [birth year] ([name]) Treating RN: Ashok Cordia, Debi Primary Care Arnesia Vincelette: [name] Other Clinician: Referring Jamarcus Laduke: [name] [name] Lee-Ann Gal/[name]: [name] in Treatment: 0 Edema Assessment Assessed: [Left: No] [Right: No] Edema: [Left: Ye] [Right: s] Calf Left: Right: Point of Measurement: 36 cm From Medial Instep 49.5 cm 54.3 cm Ankle Left: Right: Point of Measurement: 14 cm From Medial Instep 33.5 cm 31.2 cm Vascular Assessment Pulses: Dorsalis Pedis Palpable: [Right:No] Posterior Tibial Palpable: [Right:Yes] Extremity colors, hair growth, and conditions: Extremity Color: [Right:Hyperpigmented] Temperature of Extremity: [Right:Warm] Capillary Refill: [Right:> 3 seconds] Toe Nail Assessment Left: Right: Thick: Yes Discolored: Yes Deformed: Yes Improper Length and Hygiene: No Notes R ABI non-compressible Electronic Signature(s) Signed: 04/11/2017 4:05:23 PM By: [name] Entered By: [name] on 04/11/2017 10:56:27 Leeb, Akeel Elvera Lennox ([id number]) Sarwar, Rapheal R. ([id number]) -------------------------------------------------------------------------------- Multi Wound Chart Details Patient Name: MICKAEL, MCNUTT R. Date of Service: 04/11/2017 10:30 AM Medical Record Number: [id number] Patient Account Number: [id number] Date of Birth/Sex: 1951-06-24 (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] [N/A:N/A] Secondary Etiology: [5:Cellulitis] [N/A:N/A] Comorbid History: [5:Chronic sinus problems/congestion, Asthma, Chronic Obstructive Pulmonary Disease (COPD), Sleep Apnea, Congestive Heart Failure, Hypertension, Myocardial Infarction, Type II Diabetes, Osteoarthritis, Confinement Anxiety] [N/A:N/A] Date Acquired: [5:04/06/2017] [N/A:N/A] Weeks of Treatment: [5:0] [N/A:N/A] Wound Status: [5:Open] [N/A:N/A] Measurements L x W x D 7.1x7.5x0.1 [N/A:N/A] (cm) Area (cm) : [5:41.822] [N/A:N/A] Volume (cm) : [5:4.182] [N/A:N/A] Classification: [5:Grade 1] [N/A:N/A] Exudate Amount: [5:Large] [N/A:N/A] Exudate Type: [5:Serosanguineous] [N/A:N/A] Exudate Color: [5:red, brown] [N/A:N/A] Wound Margin: [5:Distinct, outline attached] [N/A:N/A] Granulation Amount: [5:Large (67-100%)] [N/A:N/A] Granulation Quality: Red N/A N/A Necrotic Amount: None Present (0%) N/A N/A Epithelialization: None N/A N/A Periwound Skin Texture: No Abnormalities Noted N/A N/A Periwound Skin No Abnormalities Noted N/A N/A Moisture: Periwound Skin Color: No Abnormalities Noted N/A N/A Temperature: No Abnormality N/A N/A Tenderness on Yes N/A N/A Palpation: Wound Preparation: Ulcer Cleansing: N/A N/A Rinsed/Irrigated with Saline, Other: soap and water Topical Anesthetic Applied: Other: lidocaine 4% Treatment Notes Electronic Signature(s) Signed: 04/11/2017  11:02:37 AM By: Evlyn Kanner MD, FACS Entered By: Evlyn Kanner on 04/11/2017 11:02:36 [name], [name] ([id number]) -------------------------------------------------------------------------------- Multi-Disciplinary Care Plan Details Patient Name: [name], LAZO. Date of Service: 04/11/2017 10:30 AM Medical Record Number: [id number] Patient Account Number: [id number] Date of Birth/Sex: 07/21/1951 ([name]) Treating RN: [name] Primary Care Patsey Pitstick: [name] Other Clinician: Referring Coreen Shippee: [name] [name] Michole Lecuyer/[name]: [name] in Treatment: 0 Active Inactive ` Abuse / Safety / Falls / Self Care Management Nursing Diagnoses: Potential for falls Goals: Patient will not experience any injury related to falls Date Initiated: 04/11/2017 Target Resolution Date: 08/13/2017 Goal Status: Active Interventions: Assess: immobility, friction, shearing, incontinence upon admission and as needed Notes: ` Nutrition Nursing Diagnoses: Imbalanced nutrition Impaired glucose control: actual or potential Potential for alteratiion in Nutrition/Potential for imbalanced nutrition Goals: Patient/caregiver will maintain therapeutic glucose control Date Initiated: 04/11/2017 Target Resolution Date: 07/16/2017 Goal Status: Active Interventions: Assess patient nutrition upon admission and as needed per policy Notes: ` Orientation to the Wound Care Program Nursing Diagnoses: ZEVEN, KOCAK ([id number]) Knowledge  deficit related to the wound healing center program Goals: Patient/caregiver will verbalize understanding of the Wound Healing Center Program Date Initiated: 04/11/2017 Target Resolution Date: 05/14/2017 Goal Status: Active Interventions: Provide education on orientation to the wound center Notes: ` Pain, Acute or Chronic Nursing Diagnoses: Pain, acute or chronic: actual or potential Potential alteration in comfort,  pain Goals: Patient/caregiver will verbalize adequate pain control between visits Date Initiated: 04/11/2017 Target Resolution Date: 07/16/2017 Goal Status: Active Interventions: Complete pain assessment as per visit requirements Notes: ` Wound/Skin Impairment Nursing Diagnoses: Impaired tissue integrity Knowledge deficit related to ulceration/compromised skin integrity Goals: Ulcer/skin breakdown will have a volume reduction of 80% by week 12 Date Initiated: 04/11/2017 Target Resolution Date: 08/06/2017 Goal Status: Active Interventions: Assess patient/caregiver ability to perform ulcer/skin care regimen upon admission and as needed Assess ulceration(s) every visit Notes: Ernest HuaWOODS, Ernest R. ([id number]) Electronic Signature(s) Signed: 04/11/2017 4:05:23 PM By: Ernest MullingPinkerton, Debra Entered By: Ernest MullingPinkerton, Debra on 04/11/2017 10:46:05 Durell, Farrel Elvera Lennox. ([id number]) -------------------------------------------------------------------------------- Pain Assessment Details Patient Name: Ernest HuaWOODS, Cashtyn R. Date of Service: 04/11/2017 10:30 AM Medical Record Number: [id number] Patient Account Number: [id number] Date of Birth/Sex: 06-Apr-1951 30(65 y.o. Male) Treating RN: Ernest HaggisPinkerton, Debi Primary Care Raidyn Wassink: Ernest MustacheJADALI, FAYEGH Other Clinician: Referring Dhana Totton: Ernest MustacheJADALI, FAYEGH [name] Leonce Bale/[name]: Ernest ReBritto, Errol Weeks in Treatment: 0 Active Problems Location of Pain Severity and Description of Pain Patient Has Paino Yes Site Locations Rate the pain. Current Pain Level: 10 Character of Pain Describe the Pain: Sharp, Shooting Pain Management and Medication Current Pain Management: Electronic Signature(s) Signed: 04/11/2017 4:05:23 PM By: Ernest MullingPinkerton, Debra Entered By: Ernest MullingPinkerton, Debra on 04/11/2017 10:08:26 [name], Ernest SpanielGORDON R. ([id number]) -------------------------------------------------------------------------------- Patient/Caregiver Education Details Patient Name: Ernest HuaWOODS, Dhairya R. Date of  Service: 04/11/2017 10:30 AM Medical Record Number: [id number] Patient Account Number: [id number] Date of Birth/Gender: 06-Apr-1951 [age] y.o. Male) Treating RN: Ernest HaggisPinkerton, Debi Primary Care Physician: Ernest MustacheJADALI, FAYEGH Other Clinician: Referring Physician: Sherrie MustacheJADALI, FAYEGH [name] Physician/[name]: Ernest ReBritto, Errol Weeks in Treatment: 0 Education Assessment Education Provided To: Patient Education Topics Provided Welcome To The [facility]: Handouts: Welcome To The [facility] Methods: Explain/Verbal Responses: State content correctly Wound/Skin Impairment: Handouts: Other: change dressing as ordered Methods: Demonstration, Explain/Verbal Responses: State content correctly Electronic Signature(s) Signed: 04/11/2017 4:05:23 PM By: Ernest MullingPinkerton, Debra Entered By: Ernest MullingPinkerton, Debra on 04/11/2017 10:47:02 [name], Gianluca Elvera Lennox. ([id number]) -------------------------------------------------------------------------------- Wound Assessment Details Patient Name: Kris MoutonWOODS, Bentlee R. Date of Service: 04/11/2017 10:30 AM Medical Record Number: [id number] Patient Account Number: [id number] Date of Birth/Sex: 06-Apr-1951 [age] y.o. Male) Treating RN: Ashok CordiaPinkerton, Debi Primary Care Berline Semrad: Ernest MustacheJADALI, FAYEGH Other Clinician: Referring Freya Zobrist: Ernest MustacheJADALI, FAYEGH [name] Antwain Caliendo/[name]: Ernest ReBritto, Errol Weeks in Treatment: 0 Wound Status Wound Number: 5 Primary Diabetic Wound/Ulcer of the Lower Etiology: Extremity Wound Location: Left Lower Leg - Anterior Secondary Cellulitis Wounding Event: Gradually Appeared Etiology: Date Acquired: 04/06/2017 Wound Open Weeks Of Treatment: 0 Status: Clustered Wound: No Comorbid Chronic sinus problems/congestion, History: Asthma, Chronic Obstructive Pulmonary Disease (COPD), Sleep Apnea, Congestive Heart Failure, Hypertension, Myocardial Infarction, Type II Diabetes, Osteoarthritis, Confinement Anxiety Wound Measurements Length: (cm) 7.1 Width: (cm) 7.5 Depth:  (cm) 0.1 Area: (cm) 41.822 Volume: (cm) 4.182 % Reduction in Area: % Reduction in Volume: Epithelialization: None Tunneling: No Undermining: No Wound Description Classification: Grade 1 Wound Margin: Distinct, outline attached Exudate Amount: Large Exudate Type: Serosanguineous Exudate Color: red, brown Foul Odor After Cleansing: No Slough/Fibrino No Wound Bed Granulation Amount: Large (67-100%) Granulation Quality: Red Necrotic Amount: None Present (0%) Periwound Skin Texture Texture Color No Abnormalities Noted: No No Abnormalities Noted: No  Moisture Temperature / Pain No Abnormalities Noted: No Temperature: No Abnormality Tenderness on Palpation: Yes Jasper, Tayvon R. ([id number]) Wound Preparation Ulcer Cleansing: Rinsed/Irrigated with Saline, Other: soap and water, Topical Anesthetic Applied: Other: lidocaine 4%, Treatment Notes Wound #5 (Left, Anterior Lower Leg) 1. Cleansed with: Clean wound with Normal Saline Cleanse wound with antibacterial soap and water 2. Anesthetic Topical Lidocaine 4% cream to wound bed prior to debridement 3. Peri-wound Care: Moisturizing lotion 4. Dressing Applied: Aquacel Ag 5. Secondary Dressing Applied ABD Pad Dry Gauze 7. Secured with Tape 3 Layer Compression System - Right Lower Extremity Notes unna to anchor Electronic Signature(s) Signed: 04/11/2017 4:05:23 PM By: [name] Entered By: [name] on 04/11/2017 10:30:21 Luz, [name] ([id number]) -------------------------------------------------------------------------------- Vitals Details Patient Name: JEMERY, STACEY. Date of Service: 04/11/2017 10:30 AM Medical Record Number: [id number] Patient Account Number: [id number] Date of Birth/Sex: 1951-05-02 ([name]) Treating RN: Ashok Cordia, Debi Primary Care Isaiha Asare: [name] Other Clinician: Referring Saori Umholtz: [name] [name] Tyshon Fanning/[name]: [name] in Treatment:  0 Vital Signs Time Taken: 10:08 Temperature (F): 97.9 Height (in): 68 Pulse (bpm): 85 Source: Stated Respiratory Rate (breaths/min): 20 Weight (lbs): 346 Blood Pressure (mmHg): 126/71 Source: Measured Reference Range: 80 - 120 mg / dl Body Mass Index (BMI): 52.6 Electronic Signature(s) Signed: 04/11/2017 4:05:23 PM By: [name] Entered By: [name] on 04/11/2017 10:09:09

## 2017-04-11 NOTE — Progress Notes (Signed)
Marisa HuaWOODS, Gleb R. ([id number]) Visit Report for 04/11/2017 Abuse/Suicide Risk Screen Details Patient Name: Marisa HuaWOODS, Dmetrius R. Date of Service: 04/11/2017 10:30 AM Medical Record Number: [id number] Patient Account Number: [id number] Date of Birth/Sex: Jan 12, 1951 [age] y.o. Male) Treating RN: Phillis HaggisPinkerton, Debi Primary Care Aldin Drees: Sherrie MustacheJADALI, FAYEGH Other Clinician: Referring Marin Milley: Sherrie MustacheJADALI, FAYEGH Treating Lorena Clearman/Extender: Rudene ReBritto, Errol Weeks in Treatment: 0 Abuse/Suicide Risk Screen Items Answer ABUSE/SUICIDE RISK SCREEN: Has anyone close to you tried to hurt or harm you recentlyo No Do you feel uncomfortable with anyone in your familyo No Has anyone forced you do things that you didnot want to doo No Do you have any thoughts of harming yourselfo No Patient displays signs or symptoms of abuse and/or neglect. No Electronic Signature(s) Signed: 04/11/2017 4:05:23 PM By: Alejandro MullingPinkerton, Debra Entered By: Alejandro MullingPinkerton, Debra on 04/11/2017 10:13:42 Heath, York SpanielGORDON R. ([id number]) -------------------------------------------------------------------------------- Activities of Daily Living Details Patient Name: Marisa HuaWOODS, Lukus R. Date of Service: 04/11/2017 10:30 AM Medical Record Number: [id number] Patient Account Number: [id number] Date of Birth/Sex: Jan 12, 1951 [age] y.o. Male) Treating RN: Phillis HaggisPinkerton, Debi Primary Care Aviva Wolfer: Sherrie MustacheJADALI, FAYEGH Other Clinician: Referring Marshal Eskew: Sherrie MustacheJADALI, FAYEGH Treating Lakeria Starkman/Extender: Rudene ReBritto, Errol Weeks in Treatment: 0 Activities of Daily Living Items Answer Activities of Daily Living (Please select one for each item) Drive Automobile Not Able Take Medications Completely Able Use Telephone Completely Able Care for Appearance Completely Able Use Toilet Completely Able Bath / Shower Need Assistance Dress Self Need Assistance Feed Self Completely Able Walk Need Assistance Get In / Out Bed Need Assistance Housework Not Able Prepare Meals Not Able Handle Money Not  Able Shop for Self Not Able Electronic Signature(s) Signed: 04/11/2017 4:05:23 PM By: Alejandro MullingPinkerton, Debra Entered By: Alejandro MullingPinkerton, Debra on 04/11/2017 10:14:35 Fobes, York SpanielGORDON R. ([id number]) -------------------------------------------------------------------------------- Education Assessment Details Patient Name: Marisa HuaWOODS, Sander R. Date of Service: 04/11/2017 10:30 AM Medical Record Number: [id number] Patient Account Number: [id number] Date of Birth/Sex: Jan 12, 1951 [age] y.o. Male) Treating RN: Phillis HaggisPinkerton, Debi Primary Care Kirke Breach: Sherrie MustacheJADALI, FAYEGH Other Clinician: Referring Duwan Adrian: Sherrie MustacheJADALI, FAYEGH Treating Akshaya Toepfer/Extender: Rudene ReBritto, Errol Weeks in Treatment: 0 Primary Learner Assessed: Patient Learning Preferences/Education Level/Primary Language Learning Preference: Explanation, Printed Material Highest Education Level: High School Preferred Language: English Cognitive Barrier Assessment/Beliefs Language Barrier: No Translator Needed: No Memory Deficit: No Emotional Barrier: No Cultural/Religious Beliefs Affecting Medical No Care: Physical Barrier Assessment Impaired Vision: No Impaired Hearing: No Decreased Hand dexterity: No Knowledge/Comprehension Assessment Knowledge Level: Medium Comprehension Level: Medium Ability to understand written Medium instructions: Ability to understand verbal Medium instructions: Motivation Assessment Anxiety Level: Calm Cooperation: Cooperative Education Importance: Acknowledges Need Interest in Health Problems: Asks Questions Perception: Coherent Willingness to Engage in Self- Medium Management Activities: Readiness to Engage in Self- Medium Management Activities: Electronic Signature(s) Marisa HuaWOODS, Boniface R. ([id number]) Signed: 04/11/2017 4:05:23 PM By: Alejandro MullingPinkerton, Debra Entered By: Alejandro MullingPinkerton, Debra on 04/11/2017 10:14:56 Micco, York SpanielGORDON R. ([id number]) -------------------------------------------------------------------------------- Fall Risk  Assessment Details Patient Name: Marisa HuaWOODS, Hayward R. Date of Service: 04/11/2017 10:30 AM Medical Record Number: [id number] Patient Account Number: [id number] Date of Birth/Sex: Jan 12, 1951 [age] y.o. Male) Treating RN: Ashok CordiaPinkerton, Debi Primary Care Shikita Vaillancourt: Sherrie MustacheJADALI, FAYEGH Other Clinician: Referring Arista Kettlewell: Sherrie MustacheJADALI, FAYEGH Treating Yazid Pop/Extender: Rudene ReBritto, Errol Weeks in Treatment: 0 Fall Risk Assessment Items Have you had 2 or more falls in the last 12 monthso 0 No Have you had any fall that resulted in injury in the last 12 monthso 0 No FALL RISK ASSESSMENT: History of falling - immediate or within 3 months 0 No Secondary diagnosis 15 Yes Ambulatory aid None/bed rest/wheelchair/nurse 0 No Crutches/cane/walker 0 No Furniture  0 No IV Access/Saline Lock 0 No Gait/Training Normal/bed rest/immobile 0 No Weak 0 No Impaired 20 Yes Mental Status Oriented to own ability 0 Yes Electronic Signature(s) Signed: 04/11/2017 4:05:23 PM By: Alejandro Mulling Entered By: Alejandro Mulling on 04/11/2017 10:15:18 Wakefield, Linsey Elvera Lennox ([id number]) -------------------------------------------------------------------------------- Foot Assessment Details Patient Name: KETIH, GOODIE R. Date of Service: 04/11/2017 10:30 AM Medical Record Number: [id number] Patient Account Number: [id number] Date of Birth/Sex: [birth year] ([name]) Treating RN: Phillis Haggis Primary Care Xcaret Morad: Sherrie Mustache Other Clinician: Referring Hawley Pavia: Sherrie Mustache Treating Damiano Stamper/Extender: Rudene Re in Treatment: 0 Foot Assessment Items Site Locations + = Sensation present, - = Sensation absent, C = Callus, U = Ulcer R = Redness, W = Warmth, M = Maceration, PU = Pre-ulcerative lesion F = Fissure, S = Swelling, D = Dryness Assessment Right: Left: Other Deformity: No No Prior Foot Ulcer: No No Prior Amputation: No No Charcot Joint: No No Ambulatory Status: Ambulatory With Help Assistance Device:  Walker Gait: Steady Electronic Signature(s) Signed: 04/11/2017 4:05:23 PM By: Alejandro Mulling Entered By: Alejandro Mulling on 04/11/2017 10:20:48 Kalas, [name] ([id number]) -------------------------------------------------------------------------------- Nutrition Risk Assessment Details Patient Name: ADAIN, GEURIN R. Date of Service: 04/11/2017 10:30 AM Medical Record Number: [id number] Patient Account Number: [id number] Date of Birth/Sex: [birth year] ([name]) Treating RN: Ashok Cordia, Debi Primary Care Chirstine Defrain: Sherrie Mustache Other Clinician: Referring Jalil Lorusso: Sherrie Mustache Treating Atlantis Delong/Extender: Rudene Re in Treatment: 0 Height (in): 68 Weight (lbs): 346 Body Mass Index (BMI): 52.6 Nutrition Risk Assessment Items NUTRITION RISK SCREEN: I have an illness or condition that made me change the kind and/or 2 Yes amount of food I eat I eat fewer than two meals per day 0 No I eat few fruits and vegetables, or milk products 0 No I have three or more drinks of beer, liquor or wine almost every day 0 No I have tooth or mouth problems that make it hard for me to eat 0 No I don't always have enough money to buy the food I need 0 No I eat alone most of the time 0 No I take three or more different prescribed or over-the-counter drugs a 1 Yes day Without wanting to, I have lost or gained 10 pounds in the last six 0 No months I am not always physically able to shop, cook and/or feed myself 2 Yes Nutrition Protocols Good Risk Protocol Moderate Risk Protocol Electronic Signature(s) Signed: 04/11/2017 4:05:23 PM By: Alejandro Mulling Entered By: Alejandro Mulling on 04/11/2017 10:15:32

## 2017-04-16 NOTE — Progress Notes (Signed)
Cardiology Office Note  Date:  04/18/2017   ID:  [name], DOB June 29, 1951, MRN [id number]  PCP:  Sherrie MustacheJadali, Fayegh, MD   Chief Complaint  Patient presents with  . other    [facility] follow up. Patient c/o swelling in legs. Patient denies chest pain and SOB. Meds reviewed verbally with patient.     HPI:  66 y.o. male with h/o  Atrial fib, persistent Ejection fraction 50-55% by echo May 2018 morbid obesity,  DM II ARF obstructive sleep apnea,  hypertension,  chronic hypercarbic respiratory failure,   COPD with no prior smoking history,  chronic lower extremity edema  who presents for follow up of his shortness of breath, persistent atrial fibrillation  admission 03/01/2017 for similar symptoms were treated for pneumonia, COPD exacerbation. During that hospitalization he converted to atrial fibrillation   fan was blowing in his face, felt it caused sinus congestion, difficulty breathing, presented to the hospital 03/2017,  [facility] reviewed with the patient in detail In the hospital,  worsening hypoxia,  low 80s  high flow nasal cannula oxygen  Weight at home 336 (same as in the office) At home from hospital was higher.  High blood today, better at home  chronic leg edema His been taking Lasix 40 mg every morning.  on eliquisFor atrial fibrillatio  EKG on today's visit shows normal sinus rhythm with rate 75 bpm no significant ST or T-wave changes   Other past medical history reviewed echocardiogram showing low normal ejection fraction 50-55%, moderate to severely elevated right heart pressures  CT scan chest from 2017 reviewed showing minimal coronary calcifications  PMH:   has a past medical history of Asthma; COPD (chronic obstructive pulmonary disease) (HCC); Heart attack (HCC); High blood pressure; High cholesterol; Scoliosis; Seasonal allergies; and Sleep apnea.  PSH:    Past Surgical History:  Procedure Laterality Date  . back sx      L4 L5   . CARPAL TUNNEL RELEASE Right     Current Outpatient Prescriptions  Medication Sig Dispense Refill  . apixaban (ELIQUIS) 5 MG TABS tablet Take 1 tablet (5 mg total) by mouth 2 (two) times daily. 60 tablet 0  . aspirin EC 81 MG tablet Take 81 mg by mouth daily.    . cetirizine (ZYRTEC) 10 MG tablet Take 10 mg by mouth daily.    Marland Kitchen. docusate sodium (COLACE) 100 MG capsule Take 1 capsule (100 mg total) by mouth 2 (two) times daily as needed for mild constipation. 10 capsule 0  . ferrous sulfate 325 (65 FE) MG EC tablet Take 325 mg by mouth daily with breakfast.    . fluticasone (FLONASE) 50 MCG/ACT nasal spray Place 1 spray into both nostrils 2 (two) times daily. 16 g 2  . furosemide (LASIX) 40 MG tablet Take 1 tablet (40 mg total) by mouth daily. 30 tablet 0  . Insulin Glargine (LANTUS SOLOSTAR) 100 UNIT/ML Solostar Pen Inject 20 Units into the skin daily at 10 pm. 15 mL 11  . ipratropium-albuterol (DUONEB) 0.5-2.5 (3) MG/3ML SOLN Take 3 mLs by nebulization 3 (three) times daily. 360 mL 6  . metoprolol tartrate (LOPRESSOR) 25 MG tablet Take 1 tablet (25 mg total) by mouth 2 (two) times daily. 60 tablet 1  . omeprazole (PRILOSEC) 20 MG capsule Take 20 mg by mouth daily.    . ondansetron (ZOFRAN ODT) 4 MG disintegrating tablet Take 1 tablet (4 mg total) by mouth every 8 (eight) hours as needed for  nausea or vomiting. 20 tablet 0  . OXYGEN Inhale 2 L into the lungs continuous.    . polyethylene glycol powder (GLYCOLAX/MIRALAX) powder Dissolve one heaping tablespoon in 4-8 ounces of juice or water and drink once daily. (Patient taking differently: Take 17 g by mouth daily as needed for mild constipation. ) 255 g 0  . pregabalin (LYRICA) 50 MG capsule Take 50 mg by mouth 2 (two) times daily.    . rosuvastatin (CRESTOR) 10 MG tablet Take 10 mg by mouth daily. Reported on 04/22/2016    . tamsulosin (FLOMAX) 0.4 MG CAPS capsule Take 0.4 mg by mouth daily.    . traMADol (ULTRAM) 50 MG tablet Take  1 tablet (50 mg total) by mouth every 6 (six) hours as needed for moderate pain. 20 tablet 0  . Vitamin D, Ergocalciferol, (DRISDOL) 50000 units CAPS capsule Take 50,000 Units by mouth every 7 (seven) days. Patient takes on Monday     No current facility-administered medications for this visit.      Allergies:   Metformin and related and Morphine and related   Social History:  The patient  reports that he has never smoked. He has never used smokeless tobacco. He reports that he does not drink alcohol or use drugs.   Family History:   family history includes Arthritis in his mother; Asthma in his father; Cancer in his father; Heart disease in his father and mother; Macular degeneration in his brother.    Review of Systems: Review of Systems  Constitutional: Negative.   Respiratory: Negative.   Cardiovascular: Negative.   Gastrointestinal: Negative.   Musculoskeletal: Negative.   Neurological: Negative.   Psychiatric/Behavioral: Negative.   All other systems reviewed and are negative.    PHYSICAL EXAM: VS:  BP (!) 158/88 (BP Location: Right Arm, Patient Position: Sitting, Cuff Size: Normal)   Pulse 75   Ht 5\' 8"  (1.727 m)   Wt (!) 336 lb (152.4 kg)   BMI 51.09 kg/m  , BMI Body mass index is 51.09 kg/m. GEN: Well nourished, well developed, in no acute distress ,  obese, presenting a wheelchair on nasal cannula oxygen  HEENT: normal  Neck: no JVD, carotid bruits, or masses Cardiac: RRR; no murmurs, rubs, or gallops, 1+ pitting edema to his thighs  Woody edema  to the thighs  Respiratory:  clear to auscultation bilaterally, normal work of breathing GI: soft, nontender, nondistended, + BS MS: no deformity or atrophy  Skin: warm and dry, no rash Neuro:  Strength and sensation are intact Psych: euthymic mood, full affect    Recent Labs: 03/01/2017: ALT 14 03/05/2017: B Natriuretic Peptide 34.0; Magnesium 2.0 03/26/2017: BUN 48; Creatinine, Ser 1.31; Hemoglobin 12.2; Platelets  138; Potassium 4.9; Sodium 143    Lipid Panel No results found for: CHOL, HDL, LDLCALC, TRIG    Wt Readings from Last 3 Encounters:  04/18/17 (!) 336 lb (152.4 kg)  03/18/17 (!) 348 lb (157.9 kg)  03/01/17 (!) 320 lb (145.2 kg)       ASSESSMENT AND PLAN:  Persistent atrial fibrillation (HCC) - Plan: EKG 12-Lead On anticoagulation, normal sinus rhythm on today's visit  Chronic hypoxemic respiratory failure (HCC) - Plan: EKG 12-Lead Still on 2 L nasal cannula oxygen Needs aggressive diuresis, will increase Lasix as detailed below  Acute renal failure with tubular necrosis (HCC) - Plan: EKG 12-Lead Followed by Dr. Thedore Mins  Lower extremity edema - Plan: EKG 12-Lead Secondary to pulmonary hypertension Recommended he increase Lasix up to 40  mg twice a day  Morbid obesity (HCC) - Plan: EKG 12-Lead We have encouraged continued exercise, careful diet management in an effort to lose weight.   chronic diastolic CHF Suspect he is 30 pounds above his dry weight  Increase Lasix up to 40 twice a day  He has are to cut back on his fluid intake    Total encounter time more than 25 minutes  Greater than 50% was spent in counseling and coordination of care with the patient  Disposition:   F/U  6 months   Orders Placed This Encounter  Procedures  . EKG 12-Lead     Signed, Dossie Arbour, M.D., Ph.D. 04/18/2017  [facility] [place], [place] [phone]

## 2017-04-18 ENCOUNTER — Ambulatory Visit (INDEPENDENT_AMBULATORY_CARE_PROVIDER_SITE_OTHER): Payer: Medicare Other | Admitting: Internal Medicine

## 2017-04-18 ENCOUNTER — Ambulatory Visit (INDEPENDENT_AMBULATORY_CARE_PROVIDER_SITE_OTHER): Payer: Medicare Other | Admitting: Cardiovascular Disease

## 2017-04-18 ENCOUNTER — Encounter: Payer: Self-pay | Admitting: Cardiovascular Disease

## 2017-04-18 ENCOUNTER — Encounter: Payer: Self-pay | Admitting: Internal Medicine

## 2017-04-18 VITALS — BP 158/88 | HR 75 | Ht 68.0 in | Wt 336.0 lb

## 2017-04-18 VITALS — BP 140/88 | HR 75 | Ht 68.0 in

## 2017-04-18 DIAGNOSIS — I481 Persistent atrial fibrillation: Secondary | ICD-10-CM

## 2017-04-18 DIAGNOSIS — I5032 Chronic diastolic (congestive) heart failure: Secondary | ICD-10-CM | POA: Diagnosis not present

## 2017-04-18 DIAGNOSIS — N170 Acute kidney failure with tubular necrosis: Secondary | ICD-10-CM | POA: Diagnosis not present

## 2017-04-18 DIAGNOSIS — R600 Localized edema: Secondary | ICD-10-CM

## 2017-04-18 DIAGNOSIS — R0602 Shortness of breath: Secondary | ICD-10-CM

## 2017-04-18 DIAGNOSIS — I4819 Other persistent atrial fibrillation: Secondary | ICD-10-CM

## 2017-04-18 DIAGNOSIS — J9611 Chronic respiratory failure with hypoxia: Secondary | ICD-10-CM

## 2017-04-18 MED ORDER — FUROSEMIDE 40 MG PO TABS: 40.0000 mg | ORAL_TABLET | Freq: Two times a day (BID) | ORAL | 3 refills | Status: DC

## 2017-04-18 MED ORDER — IPRATROPIUM-ALBUTEROL 0.5-2.5 (3) MG/3ML IN SOLN: 3.0000 mL | Freq: Three times a day (TID) | RESPIRATORY_TRACT | 6 refills | Status: DC

## 2017-04-18 NOTE — Progress Notes (Signed)
Name: [name] MRN: [id number] DOB: [birth year]     CONSULTATION DATE: 04/18/2017    Synopsis:: This is a morbidly obese 66 year old who came to the [facility] pulmonary clinic in January of 2014 for evaluation of shortness of breath. A prior history of asthma. He also has CHF as well as obstructive sleep apnea.  Full pulmonary function testing showed severe restriction consistent with obesity but no airflow obstruction.  A CT scan of his chest showed no evidence of parenchymal lung disease in 2014    12/12/2013 ROV > He continues to have shortness of breath on exertion.  He continues to use his CPAP machine at night as well as at times during the day while rest.  He lost the long tubing for his CPAP machine, but apparently he is still able to use it.  He really struggles to breathe get around the house. He does not have much problem with cough or wheezing. He does not get benefit from the Advair and Spiriva which he has been using continuously.  02/05/2014 ROV >> [place] had a hard time breathing today when his oxygen tank ran out. [facility] only has him on 1L continuously.  He has been using the oxygen at home only with exertion.  Before that he was doing well.  He has been swelling more lately so he started taking an extra dose of lasix a couple of days ago.  He has not taken the inhalers since the last visit and he actually felt a little better for a while.  HPI  04/18/2017 ROV  Recent hosp stay for acute CHF exacerbation and pneumonia Now on 4 L [place] Patient feels he is back to baseline shortness of breath Patient has been on oxygen for many years  has no evidence of infection at this time Patient has no acute issues at this time  followed by cardiology for his CHF    PAST MEDICAL HISTORY :   has a past medical history of Asthma; COPD (chronic obstructive pulmonary disease) (HCC); Heart attack (HCC); High blood pressure; High cholesterol; Scoliosis; Seasonal  allergies; and Sleep apnea.  has a past surgical history that includes back sx and Carpal tunnel release (Right). Prior to Admission medications   Medication Sig Start Date End Date Taking? Authorizing Provider  apixaban (ELIQUIS) 5 MG TABS tablet Take 1 tablet (5 mg total) by mouth 2 (two) times daily. 03/26/17  Yes Altamese Dilling, MD  aspirin EC 81 MG tablet Take 81 mg by mouth daily.   Yes [provider]  cetirizine (ZYRTEC) 10 MG tablet Take 10 mg by mouth daily.   Yes [provider]  docusate sodium (COLACE) 100 MG capsule Take 1 capsule (100 mg total) by mouth 2 (two) times daily as needed for mild constipation. 03/26/17  Yes Altamese Dilling, MD  ferrous sulfate 325 (65 FE) MG EC tablet Take 325 mg by mouth daily with breakfast.   Yes [provider]  fluticasone (FLONASE) 50 MCG/ACT nasal spray Place 1 spray into both nostrils 2 (two) times daily. 03/26/17  Yes Altamese Dilling, MD  furosemide (LASIX) 40 MG tablet Take 1 tablet (40 mg total) by mouth daily. 03/26/17  Yes Altamese Dilling, MD  Insulin Glargine (LANTUS SOLOSTAR) 100 UNIT/ML Solostar Pen Inject 20 Units into the skin daily at 10 pm. 03/26/17  Yes Altamese Dilling, MD  ipratropium-albuterol (DUONEB) 0.5-2.5 (3) MG/3ML SOLN Take 3 mLs by nebulization 3 (three) times daily. 03/26/17  Yes Altamese Dilling, MD  metoprolol tartrate (LOPRESSOR) 25 MG tablet Take 1 tablet (25 mg total) by mouth 2 (two) times daily. 03/26/17  Yes Altamese Dilling, MD  omeprazole (PRILOSEC) 20 MG capsule Take 20 mg by mouth daily.   Yes [provider]  ondansetron (ZOFRAN ODT) 4 MG disintegrating tablet Take 1 tablet (4 mg total) by mouth every 8 (eight) hours as needed for nausea or vomiting. 03/26/17  Yes Altamese Dilling, MD  OXYGEN Inhale 2 L into the lungs continuous.   Yes [provider]  polyethylene glycol powder (GLYCOLAX/MIRALAX) powder Dissolve one  heaping tablespoon in 4-8 ounces of juice or water and drink once daily. Patient taking differently: Take 17 g by mouth daily as needed for mild constipation.  12/21/15  Yes Gayla Doss, MD  pregabalin (LYRICA) 50 MG capsule Take 50 mg by mouth 2 (two) times daily.   Yes [provider]  rosuvastatin (CRESTOR) 10 MG tablet Take 10 mg by mouth daily. Reported on 04/22/2016   Yes [provider]  tamsulosin (FLOMAX) 0.4 MG CAPS capsule Take 0.4 mg by mouth daily.   Yes [provider]  traMADol (ULTRAM) 50 MG tablet Take 1 tablet (50 mg total) by mouth every 6 (six) hours as needed for moderate pain. 03/26/17  Yes Altamese Dilling, MD  Vitamin D, Ergocalciferol, (DRISDOL) 50000 units CAPS capsule Take 50,000 Units by mouth every 7 (seven) days. Patient takes on Monday   Yes [provider]   Allergies  Allergen Reactions  . Metformin And Related Other (See Comments)    "shakes" per pt  . Morphine And Related Other (See Comments)    Hallucinations, sweats    REVIEW OF SYSTEMS:   Constitutional: Negative for fever, chills, weight loss, malaise/fatigue and diaphoresis.  HENT: Negative for hearing loss, ear pain, nosebleeds, congestion, sore throat, neck pain, tinnitus and ear discharge.   Eyes: Negative for blurred vision, double vision, photophobia, pain, discharge and redness.  Respiratory: +shortness of breath, wheezing and stridor.   Cardiovascular: Negative for chest pain, palpitations, orthopnea, claudication, +leg swelling and PND.  Gastrointestinal: Negative for heartburn, nausea, vomiting, abdominal pain, diarrhea, constipation, blood in stool and melena.   ALL OTHER ROS ARE NEGATIVE  BP 140/88 (BP Location: Right Arm, Cuff Size: Large)   Pulse 75   Ht 5\' 8"  (1.727 m)   SpO2 96%    Physical Examination:   GENERAL:NAD, no fevers, chills, no weakness no fatigue EAR, NOSE, THROAT: Clear without exudates. No external lesions.  NECK:  Supple. No thyromegaly. No nodules. No JVD.  PULMONARY:CTA B/L no wheezes, no crackles, no rhonchi CARDIOVASCULAR: S1 and S2. Regular rate and rhythm. No murmurs, rubs, or gallops. No edema.  GASTROINTESTINAL: Soft, nontender, nondistended. No masses. Positive bowel sounds.  MUSCULOSKELETAL: No swelling, clubbing, or edema. Range of motion full in all extremities.  NEUROLOGIC: Cranial nerves II through XII are intact. No gross focal neurological deficits.       ASSESSMENT / PLAN:  66 yo male with PMH of restrictive lung disease from morbid obesity with Chronic hypoxic Resp. Failure,  hx of previous MI, HTN, Hyperlipidemia, noncompliant OSA, in the setting of morbid obesity and deconditioned state, Was recently discharged from the hospital due to shortness of breath and noted to be in CHF exacerbation.   1. Acute on Chronic Resp. Failure with Hypoxia and Hypercapnia - due to CHF, morbid obesity, deconditioned state  -patient will need oxygen 24/7 keep sats >90%   2. CHF- -He is already on  chronic oxygen at home. -Follow up cardiology consult recommendations -lasix as tolerated  3. Obstructive sleep apnea-patient refuses to obtain sleep study for further assessment  -he does not want to be treated for sleep apnea  4. Diabetes type II without complication- Patient will resume his Victoza and metformin   5.Obesity -recommend significant weight loss -recommend changing diet  6.Deconditioned state -Recommend increased daily activity and exercise    Patient/Family are satisfied with Plan of action and management. All questions answered Follow up in 6 months Overall prognosis is poor and guarded patient with end-stage respiratory failure  Wallis BambergKurian Santiago Gladavid Oneika Simonian, M.D.  [facility]  Medical Director [facility] Medical Director [facility] Cardio[facility]

## 2017-04-18 NOTE — Patient Instructions (Signed)
Continue oxygen as prescribed 90% or greater

## 2017-04-18 NOTE — Patient Instructions (Addendum)
Medication Instructions:   Hold the baby aspirin  Please increase the lasix up to two pills a day, Am and 2pm  Labwork:  No new labs needed  Testing/Procedures:  No further testing at this time   I recommend watching educational videos on topics of interest to you at:       [email or web address]  Enter code: HEARTCARE    Follow-Up: It was a pleasure seeing you in the office today. Please call us if you have new issues that need to be addressed before your next appt.  [phone]  Your physician wants you to follow-up in: 1 month.    If you need a refill on your cardiac medications before your next appointment, please call your pharmacy.

## 2017-04-19 ENCOUNTER — Encounter: Payer: Medicare Other | Admitting: Internal Medicine

## 2017-04-19 DIAGNOSIS — E11622 Type 2 diabetes mellitus with other skin ulcer: Secondary | ICD-10-CM | POA: Diagnosis not present

## 2017-04-20 NOTE — Progress Notes (Signed)
[name] ([id number]) Visit Report for 04/19/2017 Chief Complaint Document Details Patient Name: [name], [name]. Date of Service: 04/19/2017 1:30 PM Medical Record Patient Account Number: [id number] Number: Treating RN: Phillis Haggis 1951/01/06 (66 y.o. Other Clinician: Date of Birth/Sex: Male) Treating ROBSON, MICHAEL Primary Care Provider: Sherrie Mustache Provider/Extender: G Referring Provider: Sherrie Mustache Weeks in Treatment: 1 Information Obtained from: Patient Chief Complaint Patient returns to the wound care center for reopened ulcer to: the right lower extremity with swelling for 1 week. Electronic Signature(s) Signed: 04/19/2017 6:01:27 PM By: Baltazar Najjar MD Entered By: Baltazar Najjar on 04/19/2017 16:23:42 Mcguirt, [name] ([id number]) -------------------------------------------------------------------------------- HPI Details Patient Name: [name]. Date of Service: 04/19/2017 1:30 PM Medical Record Patient Account Number: [id number] Number: Treating RN: Phillis Haggis 1951-01-03 (66 y.o. Other Clinician: Date of Birth/Sex: Male) Treating ROBSON, MICHAEL Primary Care Provider: Sherrie Mustache Provider/Extender: G Referring Provider: Sherrie Mustache Weeks in Treatment: 1 History of Present Illness Location: right calf swelling and ulcer Quality: Patient reports experiencing a dull pain to affected area(s). Severity: Patient states wound are getting worse. Duration: Patient has had the wound for 1 week prior to seeking treatment at the wound center Timing: Pain in wound is Intermittent (comes and goes Context: The wound appeared gradually over time Associated Signs and Symptoms: Patient reports having difficulty standing for long periods. HPI Description: 66 year old gentleman who has right lower extremity swelling with ulceration for over a week has had application of compression as per his PCPs orders. He was recently  admitted between 03/18/2017 to 03/26/2017 for shortness of breath, hypoxia and HCAP. He is also known to have acute tubular necrosis, acute renal failure, hypoxia and persistent atrial fibrillation. He was given cefepime for his pneumonia and was also on by mouth prednisone. His most recent hemoglobin A1c was 6.6% He is on a Eliquis. He had a DVT study done on April 25 which was negative but not conclusive Addendum: the ultrasound tech called to say that his DVT study was negative. ====== Old notes: This 66 year old gentleman comes to see [place] for weeping of an ulcer on the left lower extremity. Other comorbidities include morbid obesity, hyperlipidemia, diabetes mellitus type 2, COPD, hypertension, congestive heart failure and prostate problems. He was here in September of last year and was discharged after appropriate treatment for his condition and we had asked him to get vascular studies and he does say that he has had them done but does not know the results. He wears his compression stockings on and off but recently has not been wearing them because they get too tight. We have just obtained his results from the [facility] -- he was last seen there in October 2016 and at that stage his superficial thrombophlebitis on the left lower extremity had resolved and there was no venous reflux or venous disease seen on the right leg on his previous study. He was asked to continue with Plavix and elevation and exercise with compression stockings. No venous intervention would benefit him. We have also reviewed the reflux study done on September 15 for both lower extremities which showed no evidence of right lower extremity deep with thrombosis or superficial thrombophlebitis. No evidence of left lower extremity deep vein thrombosis but there was evidence of left lower extremity superficial thrombophlebitis. No incompetence of the greater small saphenous veins are noted  bilaterally. 12/10/15; this is a patient who came here last week with an ulcer on his left lateral lower extremity  and weeping edema. We have reviewed his venous studies which don't suggest the benefit of venous Dunbar, Sevon R. ([id number]) interventions. His ABI in the last this 1.07. He removed the Unna boot that we put on last week yesterday and there is considerable swelling in the left leg 12/17/15 the patient returns to clinic today with the wounds on the left lateral calf completely resolved. It would appear that during a visit to this clinic last time he did have graded pressure stockings prescribed however he cannot get these on. After some discussion we decided to order him juzzo stockings ====== 04/19/17; patient with what appears to up and a large blister on the right anterior calf. Most of this is epithelialized he still has a scattering of open areas here. He states the Route we plan on last week was unbearably pruritic. He is going to get stockings for next week. He has not had a prior wound issue on the left leg. I note DVT rule out was negative Electronic Signature(s) Signed: 04/19/2017 6:01:27 PM By: Baltazar Najjar MD Entered By: Baltazar Najjar on 04/19/2017 16:31:59 Godlewski, [name] ([id number]) -------------------------------------------------------------------------------- Physical Exam Details Patient Name: [name], [name] Date of Service: 04/19/2017 1:30 PM Medical Record Patient Account Number: [id number] Number: Treating RN: Phillis Haggis 08/09/1951 (66 y.o. Other Clinician: Date of Birth/Sex: Male) Treating ROBSON, MICHAEL Primary Care Provider: Sherrie Mustache Provider/Extender: G Referring Provider: Sherrie Mustache Weeks in Treatment: 1 Constitutional Patient is hypertensive.. Pulse regular and within target range for patient.Marland Kitchen Respirations regular, non-labored and within target range.. Temperature is normal and within the target range for the  patient.Marland Kitchen appears in no distress. Respiratory Above normal respiratory effort noted. Respiratory rate elveaated. O2 on.. Shallow air entry no wheezing. Cardiovascular Heart rhythm and rate regular, without murmur or gallop. No elevation of jugular venous pressure. . Edema present in both extremities. Severe chronic venous insufficiency with secondary lymphedema.Marland Kitchen Psychiatric No evidence of depression, anxiety, or agitation. Calm, cooperative, and communicative. Appropriate interactions and affect.. Notes 04/19/17; significant chronic venous insufficiency and secondary lymphedema. Most of the area on the right anterior lower leg has epithelialized still some small open areas. There is no evidence of surrounding infection. Electronic Signature(s) Signed: 04/19/2017 6:01:27 PM By: Baltazar Najjar MD Entered By: Baltazar Najjar on 04/19/2017 16:37:06 Foister, [name] ([id number]) -------------------------------------------------------------------------------- Physician Orders Details Patient Name: CHRISTIA, COAXUM. Date of Service: 04/19/2017 1:30 PM Medical Record Patient Account Number: [id number] Number: Treating RN: Phillis Haggis 1951/05/19 (66 y.o. Other Clinician: Date of Birth/Sex: Male) Treating ROBSON, MICHAEL Primary Care Provider: Sherrie Mustache Provider/Extender: G Referring Provider: Augustin Coupe in Treatment: 1 Verbal / Phone Orders: Yes Clinician: Pinkerton, Debi Read Back and Verified: Yes Diagnosis Coding Wound Cleansing Wound #5 Left,Anterior Lower Leg o Clean wound with Normal Saline. o Cleanse wound with mild soap and water Anesthetic Wound #5 Left,Anterior Lower Leg o Topical Lidocaine 4% cream applied to wound bed prior to debridement - for clinic use Skin Barriers/Peri-Wound Care Wound #5 Left,Anterior Lower Leg o Triamcinolone Acetonide Ointment - Please put on leg when change wrap not on wounds Primary Wound Dressing Wound #5  Left,Anterior Lower Leg o Aquacel Ag Secondary Dressing Wound #5 Left,Anterior Lower Leg o ABD pad o Dry Gauze Dressing Change Frequency Wound #5 Left,Anterior Lower Leg o Other: - Change 2 times a week, pt comes to Wound Care Clinic on Wednesdays and [facility] to change another day during the week. Follow-up Appointments Wound #5 Left,Anterior Lower Leg o Return Appointment  in 1 week. Edema Control Wound #5 Left,Anterior Lower Leg Marisa HuaWOODS, Jahmire R. ([id number]) o 3 Layer Compression System - Right Lower Extremity - unna to anchor wrap 3cm from toes and 3cm from knee o Elevate legs to the level of the heart and pump ankles as often as possible Off-Loading Wound #5 Left,Anterior Lower Leg o Turn and reposition every 2 hours Additional Orders / Instructions Wound #5 Left,Anterior Lower Leg o Increase protein intake. Home Health Wound #5 Left,Anterior Lower Leg o [facility] o Home Health Nurse may visit PRN to address patientos wound care needs. o FACE TO FACE ENCOUNTER: MEDICARE and MEDICAID PATIENTS: I certify that this patient is under my care and that I had a face-to-face encounter that meets the physician face-to-face encounter requirements with this patient on this date. The encounter with the patient was in whole or in part for the following MEDICAL CONDITION: (primary reason for Home Healthcare) MEDICAL NECESSITY: I certify, that based on my findings, NURSING services are a medically necessary home health service. HOME BOUND STATUS: I certify that my clinical findings support that this patient is homebound (i.e., Due to illness or injury, pt requires aid of supportive devices such as crutches, cane, wheelchairs, walkers, the use of special transportation or the assistance of another person to leave their place of residence. There is a normal inability to leave the home and doing so requires considerable and taxing effort.  Other absences are for medical reasons / religious services and are infrequent or of short duration when for other reasons). o If current dressing causes regression in wound condition, may D/C ordered dressing product/s and apply Normal Saline Moist Dressing daily until next Wound Healing Center / Other MD appointment. Notify Wound Healing Center of regression in wound condition at [phone]. o Please direct any NON-WOUND related issues/requests for orders to patient's Primary Care Physician Patient Medications Allergies: morphine, metformin Notifications Medication Indication Start End triamcinolone acetonide apply to right 04/19/2017 leg with dressing changes DOSE topical 0.1 % cream - cream topical Electronic Signature(s) Marisa HuaWOODS, Oden R. ([id number]) Signed: 04/19/2017 4:53:24 PM By: Baltazar Najjarobson, Michael MD Entered By: Baltazar Najjarobson, Michael on 04/19/2017 16:53:24 Meenach, York SpanielGORDON R. ([id number]) -------------------------------------------------------------------------------- Problem List Details Patient Name: Marisa HuaWOODS, Hipolito R. Date of Service: 04/19/2017 1:30 PM Medical Record Patient Account Number: [id number] Number: Treating RN: Phillis Haggisinkerton, Debi 01-Aug-1951 (66 y.o. 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 ([id number]) -------------------------------------------------------------------------------- Progress Note Details Patient Name: Marisa HuaWOODS, Jaymian R. Date of Service: 04/19/2017 1:30 PM Medical Record Patient Account Number: [id number] Number: Treating RN: Phillis Haggisinkerton, Debi 01-Aug-1951 (66 y.o. Other Clinician: Date of Birth/Sex: Male) Treating ROBSON, MICHAEL Primary Care Provider: Sherrie MustacheJADALI, FAYEGH Provider/Extender: G Referring Provider: Sherrie MustacheJADALI, FAYEGH Weeks in Treatment: 1 Subjective Chief Complaint Information obtained from Patient Patient returns to  the wound care center for reopened ulcer to: the right lower extremity with swelling for 1 week. History of Present Illness (HPI) The following HPI elements were documented for the patient's wound: Location: right calf swelling and ulcer Quality: Patient reports experiencing a dull pain to affected area(s). Severity: Patient states wound are getting worse. Duration: Patient has had the wound for 1 week prior to seeking treatment at the wound center Timing: Pain in wound is Intermittent (comes and goes Context: The wound appeared gradually over time Associated Signs and Symptoms: Patient reports having difficulty standing for long periods. 66 year old gentleman who has right lower extremity swelling with ulceration for over a week has had application of compression as per his PCPs orders. He was recently admitted between 03/18/2017 to 03/26/2017 for shortness of breath, hypoxia and HCAP. He is also known to have acute tubular necrosis, acute renal failure, hypoxia and persistent atrial fibrillation. He was given cefepime for his pneumonia and was also on by mouth prednisone. His most recent hemoglobin A1c was 6.6% He is on a  Eliquis. He had a DVT study done on April 25 which was negative but not conclusive Addendum: the ultrasound tech called to say that his DVT study was negative. ====== Old notes: This 66 year old gentleman comes to see [place] for weeping of an ulcer on the left lower extremity. Other comorbidities include morbid obesity, hyperlipidemia, diabetes mellitus type 2, COPD, hypertension, congestive heart failure and prostate problems. He was here in September of last year and was discharged after appropriate treatment for his condition and we had asked him to get vascular studies and he does say that he has had them done but does not know the results. He wears his compression stockings on and off but recently has not been wearing them because they get too tight. We have just obtained his results from the [facility] -- he was last seen there in DILYN, SMILES ([id number]) October 2016 and at that stage his superficial thrombophlebitis on the left lower extremity had resolved and there was no venous reflux or venous disease seen on the right leg on his previous study. He was asked to continue with Plavix and elevation and exercise with compression stockings. No venous intervention would benefit him. We have also reviewed the reflux study done on September 15 for both lower extremities which showed no evidence of right lower extremity deep with thrombosis or superficial thrombophlebitis. No evidence of left lower extremity deep vein thrombosis but there was evidence of left lower extremity superficial thrombophlebitis. No incompetence of the greater small saphenous veins are noted bilaterally. 12/10/15; this is a patient who came here last week with an ulcer on his left lateral lower extremity and weeping edema. We have reviewed his venous studies which don't suggest the benefit of venous interventions. His ABI in the last this 1.07. He removed the Unna boot that we put on last week  yesterday and there is considerable swelling in the left leg 12/17/15 the patient returns to clinic today with the wounds on the left lateral calf completely resolved. It would appear that during a visit to this clinic last time he did have graded pressure stockings prescribed however he cannot get these on. After some discussion we decided to order him juzzo stockings ====== 04/19/17; patient with what appears to up and a large blister on the right anterior calf. Most of this is epithelialized he still has a scattering of open areas here. He states the Route  we plan on last week was unbearably pruritic. He is going to get stockings for next week. He has not had a prior wound issue on the left leg. I note DVT rule out was negative Objective Constitutional Patient is hypertensive.. Pulse regular and within target range for patient.Marland Kitchen Respirations regular, non-labored and within target range.. Temperature is normal and within the target range for the patient.Marland Kitchen appears in no distress. Vitals Time Taken: 12:51 PM, Height: 68 in, Weight: 346 lbs, BMI: 52.6, Temperature: 98.3 F, Pulse: 86 bpm, Respiratory Rate: 20 breaths/min, Blood Pressure: 162/88 mmHg. Respiratory Above normal respiratory effort noted. Respiratory rate elveaated. O2 on.. Shallow air entry no wheezing. Cardiovascular Heart rhythm and rate regular, without murmur or gallop. No elevation of jugular venous pressure. Edema present in both extremities. Severe chronic venous insufficiency with secondary lymphedema.Marland Kitchen Psychiatric No evidence of depression, anxiety, or agitation. Calm, cooperative, and communicative. Appropriate interactions and affect.. General Notes: 04/19/17; significant chronic venous insufficiency and secondary lymphedema. Most of the [place], TANUSH DREES. ([id number]) area on the right anterior lower leg has epithelialized still some small open areas. There is no evidence of surrounding infection. Integumentary (Hair,  Skin) Wound #5 status is Open. Original cause of wound was Gradually Appeared. The wound is located on the Left,Anterior Lower Leg. The wound measures 13.5cm length x 5cm width x 0.1cm depth; 53.014cm^2 area and 5.301cm^3 volume. There is no tunneling or undermining noted. There is a large amount of serosanguineous drainage noted. The wound margin is distinct with the outline attached to the wound base. There is large (67-100%) red granulation within the wound bed. There is a small (1-33%) amount of necrotic tissue within the wound bed including Adherent Slough. Periwound temperature was noted as No Abnormality. The periwound has tenderness on palpation. Assessment Active Problems ICD-10 E11.622 - Type 2 diabetes mellitus with other skin ulcer E08.59 - Diabetes mellitus due to underlying condition with other circulatory complications I82.401 - Acute embolism and thrombosis of unspecified deep veins of right lower extremity L97.211 - Non-pressure chronic ulcer of right calf limited to breakdown of skin I89.0 - Lymphedema, not elsewhere classified I50.22 - Chronic systolic (congestive) heart failure Plan Wound Cleansing: Wound #5 Left,Anterior Lower Leg: Clean wound with Normal Saline. Cleanse wound with mild soap and water Anesthetic: Wound #5 Left,Anterior Lower Leg: Topical Lidocaine 4% cream applied to wound bed prior to debridement - for clinic use Skin Barriers/Peri-Wound Care: Wound #5 Left,Anterior Lower Leg: Triamcinolone Acetonide Ointment - Please put on leg when change wrap not on wounds Primary Wound Dressing: Wound #5 Left,Anterior Lower Leg: Aquacel Ag Secondary Dressing: Wound #5 Left,Anterior Lower Leg: ABD pad Attwood, Mohannad R. ([id number]) Dry Gauze Dressing Change Frequency: Wound #5 Left,Anterior Lower Leg: Other: - Change 2 times a week, pt comes to Wound Care Clinic on Wednesdays and [facility] to change another day during the week. Follow-up Appointments: Wound  #5 Left,Anterior Lower Leg: Return Appointment in 1 week. Edema Control: Wound #5 Left,Anterior Lower Leg: 3 Layer Compression System - Right Lower Extremity - unna to anchor wrap 3cm from toes and 3cm from knee Elevate legs to the level of the heart and pump ankles as often as possible Off-Loading: Wound #5 Left,Anterior Lower Leg: Turn and reposition every 2 hours Additional Orders / Instructions: Wound #5 Left,Anterior Lower Leg: Increase protein intake. Home Health: Wound #5 Left,Anterior Lower Leg: Continue [facility] Visits - [facility] Nurse may visit PRN to address patient s wound care needs. FACE TO  FACE ENCOUNTER: MEDICARE and MEDICAID PATIENTS: I certify that this patient is under my care and that I had a face-to-face encounter that meets the physician face-to-face encounter requirements with this patient on this date. The encounter with the patient was in whole or in part for the following MEDICAL CONDITION: (primary reason for Home Healthcare) MEDICAL NECESSITY: I certify, that based on my findings, NURSING services are a medically necessary home health service. HOME BOUND STATUS: I certify that my clinical findings support that this patient is homebound (i.e., Due to illness or injury, pt requires aid of supportive devices such as crutches, cane, wheelchairs, walkers, the use of special transportation or the assistance of another person to leave their place of residence. There is a normal inability to leave the home and doing so requires considerable and taxing effort. Other absences are for medical reasons / religious services and are infrequent or of short duration when for other reasons). If current dressing causes regression in wound condition, may D/C ordered dressing product/s and apply Normal Saline Moist Dressing daily until next Wound Healing Center / Other MD appointment. Notify Wound Healing Center of regression in wound condition at  [phone]. Please direct any NON-WOUND related issues/requests for orders to patient's Primary Care Physician The following medication(s) was prescribed: triamcinolone acetonide topical 0.1 % cream cream topical for apply to right leg with dressing changes starting 04/19/2017 #1 triamcinolone, Aquacel Ag, ABG, 3 layer compression. I think he can probably tolerate 4 layer compression however it looks like he is progressing towards closure #2 he is going for stockings this week on the anticipation that this might be closed next week MANVEER, GOMES ([id number]) Electronic Signature(s) Signed: 04/19/2017 4:53:49 PM By: Baltazar Najjar MD Entered By: Baltazar Najjar on 04/19/2017 16:53:49 Hegel, [name] ([id number]) -------------------------------------------------------------------------------- SuperBill Details Patient Name: [name]. Date of Service: 04/19/2017 Medical Record Patient Account Number: [id number] Number: Treating RN: Phillis Haggis [birth year] (66 y.o. Other Clinician: Date of Birth/Sex: Male) Treating ROBSON, MICHAEL Primary Care Provider: Sherrie Mustache Provider/Extender: G Referring Provider: Sherrie Mustache Weeks in Treatment: 1 Diagnosis Coding ICD-10 Codes Code Description E11.622 Type 2 diabetes mellitus with other skin ulcer E08.59 Diabetes mellitus due to underlying condition with other circulatory complications I82.401 Acute embolism and thrombosis of unspecified deep veins of right lower extremity L97.211 Non-pressure chronic ulcer of right calf limited to breakdown of skin I89.0 Lymphedema, not elsewhere classified I50.22 Chronic systolic (congestive) heart failure Facility Procedures CPT4: Description Modifier Quantity Code [id number] (Facility Use Only) [phone] - APPLY MULTLAY COMPRS LWR RT 1 LEG Physician Procedures CPT4 Code Description: [id number] - WC PHYS LEVEL 3 - EST PT ICD-10 Description Diagnosis E11.622 Type 2 diabetes  mellitus with other skin ulcer L97.211 Non-pressure chronic ulcer of right calf limited t Modifier: o breakdown of Quantity: 1 skin Electronic Signature(s) Signed: 04/19/2017 6:01:27 PM By: Baltazar Najjar MD Entered By: Baltazar Najjar on 04/19/2017 16:38:48

## 2017-04-20 NOTE — Progress Notes (Signed)
[name], [name] ([id number]) Visit Report for 04/19/2017 Arrival Information Details Patient Name: [name], [name]. Date of Service: 04/19/2017 1:30 PM Medical Record Number: [id number] Patient Account Number: [id number] Date of Birth/Sex: 26-Nov-1950 ([name]) Treating RN: Phillis Haggis Primary Care Tyesha Joffe: Sherrie Mustache Other Clinician: Referring Welborn Keena: Sherrie Mustache Treating Leinaala Catanese/Extender: Altamese [place] in Treatment: 1 Visit Information History Since Last Visit All ordered tests and consults were completed: No Patient Arrived: Wheel Chair Added or deleted any medications: No Arrival Time: 12:50 Any new allergies or adverse reactions: No Accompanied By: brother in law Had a fall or experienced change in No Transfer Assistance: EasyPivot Patient activities of daily living that may affect Lift risk of falls: Patient Identification Verified: Yes Signs or symptoms of abuse/neglect since last No Secondary Verification Process Yes visito Completed: Hospitalized since last visit: No Patient Requires Transmission- No Has Dressing in Place as Prescribed: Yes Based Precautions: Has Compression in Place as Prescribed: Yes Patient Has Alerts: Yes Pain Present Now: No Patient Alerts: Patient on Blood Thinner DM II Eliquis R ABI non- compressible Electronic Signature(s) Signed: 04/19/2017 5:04:17 PM By: Alejandro Mulling Entered By: Alejandro Mulling on 04/19/2017 12:51:26 Dygert, Ernest Spaniel ([id number]) -------------------------------------------------------------------------------- Encounter Discharge Information Details Patient Name: [name], Ernest R. Date of Service: 04/19/2017 1:30 PM Medical Record Number: [id number] Patient Account Number: [id number] Date of Birth/Sex: [birth year] ([name]) Treating RN: Phillis Haggis Primary Care Vear Staton: Sherrie Mustache Other Clinician: Referring Karim Aiello: Sherrie Mustache Treating Keyna Blizard/Extender: Altamese [place] in Treatment: 1 Encounter Discharge Information Items Discharge Pain Level: 0 Discharge Condition: Stable Ambulatory Status: Wheelchair Discharge Destination: Home Transportation: Private Auto Accompanied By: brother in law Schedule Follow-up Appointment: Yes Medication Reconciliation completed and provided to Patient/Care No Jahshua Bonito: Provided on Clinical Summary of Care: 04/19/2017 Form Type Recipient Paper Patient GW Electronic Signature(s) Signed: 04/19/2017 1:36:54 PM By: Gwenlyn Perking Entered By: Gwenlyn Perking on 04/19/2017 13:36:53 Piccione, Ernest Spaniel ([id number]) -------------------------------------------------------------------------------- Lower Extremity Assessment Details Patient Name: [name], Ernest R. Date of Service: 04/19/2017 1:30 PM Medical Record Number: [id number] Patient Account Number: [id number] Date of Birth/Sex: [birth year] ([name]) Treating RN: Ashok Cordia, Debi Primary Care Sharde Gover: Sherrie Mustache Other Clinician: Referring Marykathryn Carboni: Sherrie Mustache Treating Calirose Mccance/Extender: Maxwell Caul Weeks in Treatment: 1 Edema Assessment Assessed: [Left: No] [Right: No] E[Left: dema] [Right: :] Calf Left: Right: Point of Measurement: 36 cm From Medial Instep cm 52 cm Ankle Left: Right: Point of Measurement: 14 cm From Medial Instep cm 29.8 cm Vascular Assessment Pulses: Dorsalis Pedis Palpable: [Right:Yes] Doppler Audible: [Right:Yes] Posterior Tibial Extremity colors, hair growth, and conditions: Extremity Color: [Right:Hyperpigmented] Temperature of Extremity: [Right:Warm] Capillary Refill: [Right:> 3 seconds] Electronic Signature(s) Signed: 04/19/2017 5:04:17 PM By: Alejandro Mulling Entered By: Alejandro Mulling on 04/19/2017 13:06:32 Brester, Ernest Spaniel ([id number]) -------------------------------------------------------------------------------- Multi Wound Chart Details Patient Name: Ernest Hua. Date of Service:  04/19/2017 1:30 PM Medical Record Number: [id number] Patient Account Number: [id number] Date of Birth/Sex: 1951/11/05 ([name]) Treating RN: Ashok Cordia, Debi Primary Care Ernestina Joe: Sherrie Mustache Other Clinician: Referring Evany Schecter: Sherrie Mustache Treating Kais Monje/Extender: Maxwell Caul Weeks in Treatment: 1 Vital Signs Height(in): 68 Pulse(bpm): 86 Weight(lbs): 346 Blood Pressure 162/88 (mmHg): Body Mass Index(BMI): 53 Temperature(F): 98.3 Respiratory Rate 20 (breaths/min): Photos: [N/A:N/A] Wound Location: Left Lower Leg - Anterior N/A N/A Wounding Event: Gradually Appeared N/A N/A Primary Etiology: Diabetic Wound/Ulcer of N/A N/A the Lower Extremity Secondary Etiology: Cellulitis N/A N/A Comorbid History: Chronic sinus N/A N/A problems/congestion, Asthma, Chronic Obstructive Pulmonary Disease (  COPD), Sleep Apnea, Congestive Heart Failure, Hypertension, Myocardial Infarction, Type II Diabetes, Osteoarthritis, Confinement Anxiety Date Acquired: 04/06/2017 N/A N/A Weeks of Treatment: 1 N/A N/A Wound Status: Open N/A N/A Measurements L x W x D 13.5x5x0.1 N/A N/A (cm) Area (cm) : 53.014 N/A N/A Volume (cm) : 5.301 N/A N/A Buhman, Lorn R. ([id number]) % Reduction in Area: -26.80% N/A N/A % Reduction in Volume: -26.80% N/A N/A Classification: Grade 1 N/A N/A Exudate Amount: Large N/A N/A Exudate Type: Serosanguineous N/A N/A Exudate Color: red, brown N/A N/A Wound Margin: Distinct, outline attached N/A N/A Granulation Amount: Large (67-100%) N/A N/A Granulation Quality: Red N/A N/A Necrotic Amount: Small (1-33%) N/A N/A Epithelialization: None N/A N/A Periwound Skin Texture: No Abnormalities Noted N/A N/A Periwound Skin No Abnormalities Noted N/A N/A Moisture: Periwound Skin Color: No Abnormalities Noted N/A N/A Temperature: No Abnormality N/A N/A Tenderness on Yes N/A N/A Palpation: Wound Preparation: Ulcer Cleansing: N/A N/A Rinsed/Irrigated  with Saline, Other: soap and water Topical Anesthetic Applied: Other: lidocaine 4% Treatment Notes Wound #5 (Left, Anterior Lower Leg) 1. Cleansed with: Clean wound with Normal Saline Cleanse wound with antibacterial soap and water 2. Anesthetic Topical Lidocaine 4% cream to wound bed prior to debridement 3. Peri-wound Care: Other peri-wound care (specify in notes) 4. Dressing Applied: Aquacel Ag 5. Secondary Dressing Applied ABD Pad 7. Secured with Tape 3 Layer Compression System - Right Lower Extremity Notes unna to anchor, TCA [name], [name] ([id number]) Electronic Signature(s) Signed: 04/19/2017 6:01:27 PM By: Baltazar Najjar MD Entered By: Baltazar Najjar on 04/19/2017 16:23:34 Billiter, Ernest Spaniel ([id number]) -------------------------------------------------------------------------------- Multi-Disciplinary Care Plan Details Patient Name: [name], STITES. Date of Service: 04/19/2017 1:30 PM Medical Record Number: [id number] Patient Account Number: [id number] Date of Birth/Sex: 30-Jun-1951 ([name]) Treating RN: Ashok Cordia, Debi Primary Care Nayali Talerico: Sherrie Mustache Other Clinician: Referring Thomasa Heidler: Sherrie Mustache Treating Anjelina Dung/Extender: Altamese [place] in Treatment: 1 Active Inactive ` Abuse / Safety / Falls / Self Care Management Nursing Diagnoses: Potential for falls Goals: Patient will not experience any injury related to falls Date Initiated: 04/11/2017 Target Resolution Date: 08/13/2017 Goal Status: Active Interventions: Assess: immobility, friction, shearing, incontinence upon admission and as needed Notes: ` Nutrition Nursing Diagnoses: Imbalanced nutrition Impaired glucose control: actual or potential Potential for alteratiion in Nutrition/Potential for imbalanced nutrition Goals: Patient/caregiver will maintain therapeutic glucose control Date Initiated: 04/11/2017 Target Resolution Date: 07/16/2017 Goal Status:  Active Interventions: Assess patient nutrition upon admission and as needed per policy Notes: ` Orientation to the Wound Care Program Nursing Diagnoses: KAHLEN, MORAIS ([id number]) Knowledge deficit related to the wound healing center program Goals: Patient/caregiver will verbalize understanding of the Wound Healing Center Program Date Initiated: 04/11/2017 Target Resolution Date: 05/14/2017 Goal Status: Active Interventions: Provide education on orientation to the wound center Notes: ` Pain, Acute or Chronic Nursing Diagnoses: Pain, acute or chronic: actual or potential Potential alteration in comfort, pain Goals: Patient/caregiver will verbalize adequate pain control between visits Date Initiated: 04/11/2017 Target Resolution Date: 07/16/2017 Goal Status: Active Interventions: Complete pain assessment as per visit requirements Notes: ` Wound/Skin Impairment Nursing Diagnoses: Impaired tissue integrity Knowledge deficit related to ulceration/compromised skin integrity Goals: Ulcer/skin breakdown will have a volume reduction of 80% by week 12 Date Initiated: 04/11/2017 Target Resolution Date: 08/06/2017 Goal Status: Active Interventions: Assess patient/caregiver ability to perform ulcer/skin care regimen upon admission and as needed Assess ulceration(s) every visit Notes: [name], [name] ([id number]) Electronic Signature(s) Signed: 04/19/2017 5:04:17 PM By: Alejandro Mulling Entered By: Alejandro Mulling  on 04/19/2017 13:07:15 [name], Ernest SpanielGORDON R. ([id number]) -------------------------------------------------------------------------------- Pain Assessment Details Patient Name: [name], Ernest R. Date of Service: 04/19/2017 1:30 PM Medical Record Number: [id number] Patient Account Number: [id number] Date of Birth/Sex: [birth year] [age] y.o. Male) Treating RN: Phillis HaggisPinkerton, Debi Primary Care Durinda Buzzelli: Sherrie MustacheJADALI, FAYEGH Other Clinician: Referring Alasdair Kleve: Sherrie MustacheJADALI, FAYEGH Treating  Akera Snowberger/Extender: Maxwell CaulOBSON, MICHAEL G Weeks in Treatment: 1 Active Problems Location of Pain Severity and Description of Pain Patient Has Paino No Site Locations With Dressing Change: No Pain Management and Medication Current Pain Management: Electronic Signature(s) Signed: 04/19/2017 5:04:17 PM By: Alejandro MullingPinkerton, Debra Entered By: Alejandro MullingPinkerton, Debra on 04/19/2017 12:51:32 Elem, Ernest SpanielGORDON R. ([id number]) -------------------------------------------------------------------------------- Patient/Caregiver Education Details Patient Name: [name], Ernest R. Date of Service: 04/19/2017 1:30 PM Medical Record Patient Account Number: [id number] Number: Treating RN: Phillis Haggisinkerton, Debi [birth year] (66 y.o. Other Clinician: Date of Birth/Gender: Male) Treating ROBSON, MICHAEL Primary Care Physician: Sherrie MustacheJADALI, FAYEGH Physician/Extender: G Referring Physician: Augustin CoupeJADALI, FAYEGH Weeks in Treatment: 1 Education Assessment Education Provided To: Patient Education Topics Provided Wound/Skin Impairment: Handouts: Other: change dressing as ordered Methods: Demonstration, Explain/Verbal Responses: State content correctly Electronic Signature(s) Signed: 04/19/2017 5:04:17 PM By: Alejandro MullingPinkerton, Debra Entered By: Alejandro MullingPinkerton, Debra on 04/19/2017 13:11:26 Longhi, Ernest SpanielGORDON R. ([id number]) -------------------------------------------------------------------------------- Wound Assessment Details Patient Name: [name] MoutonWOODS, Ernest R. Date of Service: 04/19/2017 1:30 PM Medical Record Number: [id number] Patient Account Number: [id number] Date of Birth/Sex: [birth year] [age] y.o. Male) Treating RN: Phillis HaggisPinkerton, Debi Primary Care Herberta Pickron: Sherrie MustacheJADALI, FAYEGH Other Clinician: Referring Zaccary Creech: Sherrie MustacheJADALI, FAYEGH Treating Girtha Kilgore/Extender: Maxwell CaulOBSON, MICHAEL G Weeks in Treatment: 1 Wound Status Wound Number: 5 Primary Diabetic Wound/Ulcer of the Lower Etiology: Extremity Wound Location: Left Lower Leg - Anterior Secondary  Cellulitis Wounding Event: Gradually Appeared Etiology: Date Acquired: 04/06/2017 Wound Open Weeks Of Treatment: 1 Status: Clustered Wound: No Comorbid Chronic sinus problems/congestion, History: Asthma, Chronic Obstructive Pulmonary Disease (COPD), Sleep Apnea, Congestive Heart Failure, Hypertension, Myocardial Infarction, Type II Diabetes, Osteoarthritis, Confinement Anxiety Photos Photo Uploaded By: Alejandro MullingPinkerton, Debra on 04/19/2017 16:22:52 Wound Measurements Length: (cm) 13.5 Width: (cm) 5 Depth: (cm) 0.1 Area: (cm) 53.014 Volume: (cm) 5.301 % Reduction in Area: -26.8% % Reduction in Volume: -26.8% Epithelialization: None Tunneling: No Undermining: No Wound Description Classification: Grade 1 Wound Margin: Distinct, outline attached Exudate Amount: Large Exudate Type: Serosanguineous Exudate Color: red, brown Rosenberry, Uno R. ([id number]) Foul Odor After Cleansing: No Slough/Fibrino No Wound Bed Granulation Amount: Large (67-100%) Granulation Quality: Red Necrotic Amount: Small (1-33%) Necrotic Quality: Adherent Slough Periwound Skin Texture Texture Color No Abnormalities Noted: No No Abnormalities Noted: No Moisture Temperature / Pain No Abnormalities Noted: No Temperature: No Abnormality Tenderness on Palpation: Yes Wound Preparation Ulcer Cleansing: Rinsed/Irrigated with Saline, Other: soap and water, Topical Anesthetic Applied: Other: lidocaine 4%, Treatment Notes Wound #5 (Left, Anterior Lower Leg) 1. Cleansed with: Clean wound with Normal Saline Cleanse wound with antibacterial soap and water 2. Anesthetic Topical Lidocaine 4% cream to wound bed prior to debridement 3. Peri-wound Care: Other peri-wound care (specify in notes) 4. Dressing Applied: Aquacel Ag 5. Secondary Dressing Applied ABD Pad 7. Secured with Tape 3 Layer Compression System - Right Lower Extremity Notes unna to anchor, TCA Electronic Signature(s) Signed: 04/19/2017  5:04:17 PM By: Alejandro MullingPinkerton, Debra Entered By: Alejandro MullingPinkerton, Debra on 04/19/2017 13:05:13 Yeargan, Ernest SpanielGORDON R. ([id number]) -------------------------------------------------------------------------------- Vitals Details Patient Name: [name], Hazim R. Date of Service: 04/19/2017 1:30 PM Medical Record Number: [id number] Patient Account Number: [id number] Date of Birth/Sex: [birth year] [age] y.o. Male) Treating RN: Phillis HaggisPinkerton, Debi Primary Care Jahmai Finelli: Sherrie MustacheJADALI, FAYEGH Other Clinician: Referring Chitara Clonch: Sherrie MustacheJADALI, FAYEGH Treating  Lelania Bia/Extender: Maxwell Caul Weeks in Treatment: 1 Vital Signs Time Taken: 12:51 Temperature (F): 98.3 Height (in): 68 Pulse (bpm): 86 Weight (lbs): 346 Respiratory Rate (breaths/min): 20 Body Mass Index (BMI): 52.6 Blood Pressure (mmHg): 162/88 Reference Range: 80 - 120 mg / dl Electronic Signature(s) Signed: 04/19/2017 5:04:17 PM By: Alejandro Mulling Entered By: Alejandro Mulling on 04/19/2017 12:56:04

## 2017-04-25 ENCOUNTER — Telehealth: Payer: Self-pay | Admitting: Cardiovascular Disease

## 2017-04-25 ENCOUNTER — Other Ambulatory Visit: Payer: Self-pay

## 2017-04-25 MED ORDER — APIXABAN 5 MG PO TABS: 5.0000 mg | ORAL_TABLET | Freq: Two times a day (BID) | ORAL | 0 refills | Status: DC

## 2017-04-25 NOTE — Telephone Encounter (Signed)
Requested Prescriptions   Signed Prescriptions Disp Refills  . apixaban (ELIQUIS) 5 MG TABS tablet 60 tablet 0    Sig: Take 1 tablet (5 mg total) by mouth 2 (two) times daily.    Authorizing Provider: GOLLAN, TIMOTHY J    Ordering User: [place] Kappes N    

## 2017-04-25 NOTE — Telephone Encounter (Signed)
°*  STAT* If patient is at the pharmacy, call can be transferred to refill team.   1. Which medications need to be refilled? (please list name of each medication and dose if known) Eliquis 5 mg po bid  2. Which pharmacy/location (including street and city if local pharmacy) is medication to be sent to? [facility]  3. Do they need a 30 day or 90 day supply? 90  PATIENT IS OUT OF MEDICINE

## 2017-04-25 NOTE — Telephone Encounter (Signed)
Requested Prescriptions   Signed Prescriptions Disp Refills  . apixaban (ELIQUIS) 5 MG TABS tablet 60 tablet 0    Sig: Take 1 tablet (5 mg total) by mouth 2 (two) times daily.    Authorizing Provider: GOLLAN, TIMOTHY J    Ordering User: Bexlee Bergdoll N    

## 2017-04-26 ENCOUNTER — Encounter: Payer: Medicare Other | Admitting: Internal Medicine

## 2017-04-26 ENCOUNTER — Other Ambulatory Visit: Payer: Self-pay

## 2017-04-26 ENCOUNTER — Other Ambulatory Visit: Payer: Self-pay | Admitting: *Deleted

## 2017-04-26 DIAGNOSIS — E11622 Type 2 diabetes mellitus with other skin ulcer: Secondary | ICD-10-CM | POA: Diagnosis not present

## 2017-04-26 MED ORDER — APIXABAN 5 MG PO TABS
5.0000 mg | ORAL_TABLET | Freq: Two times a day (BID) | ORAL | 0 refills | Status: DC
Start: 2017-04-26 — End: 2017-04-26

## 2017-04-26 MED ORDER — APIXABAN 5 MG PO TABS: 5.0000 mg | ORAL_TABLET | Freq: Two times a day (BID) | ORAL | 3 refills | Status: DC

## 2017-04-26 NOTE — Telephone Encounter (Signed)
Requested Prescriptions   Signed Prescriptions Disp Refills  . apixaban (ELIQUIS) 5 MG TABS tablet 60 tablet 0    Sig: Take 1 tablet (5 mg total) by mouth 2 (two) times daily.    Authorizing Provider: Antonieta IbaGOLLAN, TIMOTHY J    Ordering User: Margrett RudSLAYTON, Hena Ewalt N

## 2017-04-27 NOTE — Progress Notes (Signed)
[name], Ernest R. ([id number]) Visit Report for 04/26/2017 Arrival Information Details Patient Name: [name], Ernest R. Date of Service: 04/26/2017 1:30 PM Medical Record Number: [id number] Patient Account Number: [id number] Date of Birth/Sex: 03/21/1951 1(65 y.o. Male) Treating RN: Phillis HaggisPinkerton, Debi Primary Care Tishia Maestre: Sherrie MustacheJADALI, FAYEGH Other Clinician: Referring Rossana Molchan: Sherrie MustacheJADALI, FAYEGH Treating Gor Vestal/Extender: Altamese CarolinaOBSON, MICHAEL G Weeks in Treatment: 2 Visit Information History Since Last Visit All ordered tests and consults were completed: No Patient Arrived: Wheel Chair Added or deleted any medications: No Arrival Time: 13:22 Any new allergies or adverse reactions: No Accompanied By: brother in law Had a fall or experienced change in No Transfer Assistance: EasyPivot Patient activities of daily living that may affect Lift risk of falls: Patient Identification Verified: Yes Signs or symptoms of abuse/neglect since last No Secondary Verification Process Yes visito Completed: Hospitalized since last visit: No Patient Requires Transmission- No Has Dressing in Place as Prescribed: Yes Based Precautions: Has Compression in Place as Prescribed: Yes Patient Has Alerts: Yes Pain Present Now: No Patient Alerts: Patient on Blood Thinner DM II Eliquis R ABI non- compressible Electronic Signature(s) Signed: 04/26/2017 5:11:11 PM By: Alejandro MullingPinkerton, Debra Entered By: Alejandro MullingPinkerton, Debra on 04/26/2017 13:29:42 Mcintire, York SpanielGORDON R. ([id number]) -------------------------------------------------------------------------------- Encounter Discharge Information Details Patient Name: [name], Ernest R. Date of Service: 04/26/2017 1:30 PM Medical Record Number: [id number] Patient Account Number: [id number] Date of Birth/Sex: 03/21/1951 [age] y.o. Male) Treating RN: Phillis HaggisPinkerton, Debi Primary Care Mukhtar Shams: Sherrie MustacheJADALI, FAYEGH Other Clinician: Referring Jelan Batterton: Sherrie MustacheJADALI, FAYEGH Treating Toshio Slusher/Extender: Altamese CarolinaOBSON,  MICHAEL G Weeks in Treatment: 2 Encounter Discharge Information Items Discharge Pain Level: 0 Discharge Condition: Stable Ambulatory Status: Wheelchair Discharge Destination: Home Transportation: Private Auto Accompanied By: brother in law Schedule Follow-up Appointment: Yes Medication Reconciliation completed and provided to Patient/Care No Tyshon Fanning: Provided on Clinical Summary of Care: 04/26/2017 Form Type Recipient Paper Patient GW Electronic Signature(s) Signed: 04/26/2017 2:03:58 PM By: Gwenlyn PerkingMoore, Shelia Entered By: Gwenlyn PerkingMoore, Shelia on 04/26/2017 14:03:57 Orf, York SpanielGORDON R. ([id number]) -------------------------------------------------------------------------------- Lower Extremity Assessment Details Patient Name: [name], Ernest R. Date of Service: 04/26/2017 1:30 PM Medical Record Number: [id number] Patient Account Number: [id number] Date of Birth/Sex: 03/21/1951 [age] y.o. Male) Treating RN: Ashok CordiaPinkerton, Debi Primary Care Jabree Pernice: Sherrie MustacheJADALI, FAYEGH Other Clinician: Referring Zeek Rostron: Sherrie MustacheJADALI, FAYEGH Treating Naleah Kofoed/Extender: Maxwell CaulOBSON, MICHAEL G Weeks in Treatment: 2 Edema Assessment Assessed: [Left: No] [Right: No] E[Left: dema] [Right: :] Calf Left: Right: Point of Measurement: 36 cm From Medial Instep cm 45.9 cm Ankle Left: Right: Point of Measurement: 14 cm From Medial Instep cm 28.5 cm Vascular Assessment Pulses: Dorsalis Pedis Palpable: [Right:Yes] Posterior Tibial Extremity colors, hair growth, and conditions: Extremity Color: [Right:Hyperpigmented] Temperature of Extremity: [Right:Warm] Capillary Refill: [Right:< 3 seconds] Toe Nail Assessment Left: Right: Thick: Yes Discolored: Yes Deformed: Yes Improper Length and Hygiene: Yes Electronic Signature(s) Signed: 04/26/2017 5:11:11 PM By: Alejandro MullingPinkerton, Debra Entered By: Alejandro MullingPinkerton, Debra on 04/26/2017 13:44:36 Allende, York SpanielGORDON R.  ([id number]) -------------------------------------------------------------------------------- Multi Wound Chart Details Patient Name: [name], Ernest R. Date of Service: 04/26/2017 1:30 PM Medical Record Number: [id number] Patient Account Number: [id number] Date of Birth/Sex: 03/21/1951 [age] y.o. 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] [N/A:N/A] Secondary Etiology: [5:Cellulitis] [N/A:N/A] Comorbid  History: [5:Chronic sinus problems/congestion, Asthma, Chronic Obstructive Pulmonary Disease (COPD), Sleep Apnea, Congestive Heart Failure, Hypertension, Myocardial Infarction, Type II Diabetes, Osteoarthritis, Confinement Anxiety] [N/A:N/A] Date Acquired: [5:04/06/2017] [N/A:N/A] Weeks of Treatment: [5:2] [N/A:N/A] Wound Status: [5:Open] [N/A:N/A] Measurements L x W x D 0.7x0.7x0.1 [N/A:N/A] (cm) Area (cm) : [5:0.385] [N/A:N/A] Volume (cm) : [5:0.038] [N/A:N/A] % Reduction in Area: [5:99.10%] [N/A:N/A] % Reduction in Volume: 99.10% [N/A:N/A] Classification: [5:Grade 1] [N/A:N/A] Exudate Amount: [5:Large] [N/A:N/A] Exudate Type: [5:Serosanguineous] [N/A:N/A] Exudate Color: [5:red, brown] [N/A:N/A] Wound Margin: Distinct, outline attached N/A N/A Granulation Amount: Large (67-100%) N/A N/A Granulation Quality: Red N/A N/A Necrotic Amount: None Present (0%) N/A N/A Epithelialization: None N/A N/A Periwound Skin Texture: No Abnormalities Noted N/A N/A Periwound  Skin No Abnormalities Noted N/A N/A Moisture: Periwound Skin Color: No Abnormalities Noted N/A N/A Temperature: No Abnormality N/A N/A Tenderness on Yes N/A N/A Palpation: Wound Preparation: Ulcer Cleansing: N/A N/A Rinsed/Irrigated with Saline, Other: soap and water Topical Anesthetic Applied: Other: lidocaine 4% Treatment Notes Wound #5 (Left, Anterior Lower Leg) 1. Cleansed with: Clean wound with Normal Saline Cleanse wound with antibacterial soap and water 4. Dressing Applied: Prisma Ag 5. Secondary Dressing Applied ABD Pad Dry Gauze 7. Secured with Tape 3 Layer Compression System - Right Lower Extremity Notes unna to anchor, TCA Electronic Signature(s) Signed: 04/26/2017 4:47:51 PM By: Baltazar Najjar MD Entered By: Baltazar Najjar on 04/26/2017 14:00:11 Weimann, [name] ([id number]) -------------------------------------------------------------------------------- Multi-Disciplinary Care Plan Details Patient Name: [name], GROOT. Date of Service: 04/26/2017 1:30 PM Medical Record Number: [id number] Patient Account Number: [id number] Date of Birth/Sex: 27-Nov-1950 ([name]) Treating RN: Ashok Cordia, Debi Primary Care Alfreddie Consalvo: Sherrie Mustache Other Clinician: Referring Fraida Veldman: Sherrie Mustache Treating Early Steel/Extender: Altamese [place] in Treatment: 2 Active Inactive ` Abuse / Safety / Falls / Self Care Management Nursing Diagnoses: Potential for falls Goals: Patient will not experience any injury related to falls Date Initiated: 04/11/2017 Target Resolution Date: 08/13/2017 Goal Status: Active Interventions: Assess: immobility, friction, shearing, incontinence upon admission and as needed Notes: ` Nutrition Nursing Diagnoses: Imbalanced nutrition Impaired glucose control: actual or potential Potential for alteratiion in Nutrition/Potential for imbalanced nutrition Goals: Patient/caregiver will maintain therapeutic glucose control Date  Initiated: 04/11/2017 Target Resolution Date: 07/16/2017 Goal Status: Active Interventions: Assess patient nutrition upon admission and as needed per policy Notes: ` Orientation to the Wound Care Program Nursing Diagnoses: TRAVIUS, CROCHET ([id number]) Knowledge deficit related to the wound healing center program Goals: Patient/caregiver will verbalize understanding of the Wound Healing Center Program Date Initiated: 04/11/2017 Target Resolution Date: 05/14/2017 Goal Status: Active Interventions: Provide education on orientation to the wound center Notes: ` Pain, Acute or Chronic Nursing Diagnoses: Pain, acute or chronic: actual or potential Potential alteration in comfort, pain Goals: Patient/caregiver will verbalize adequate pain control between visits Date Initiated: 04/11/2017 Target Resolution Date: 07/16/2017 Goal Status: Active Interventions: Complete pain assessment as per visit requirements Notes: ` Wound/Skin Impairment Nursing Diagnoses: Impaired tissue integrity Knowledge deficit related to ulceration/compromised skin integrity Goals: Ulcer/skin breakdown will have a volume reduction of 80% by week 12 Date Initiated: 04/11/2017 Target Resolution Date: 08/06/2017 Goal Status: Active Interventions: Assess patient/caregiver ability to perform ulcer/skin care regimen upon admission and as needed Assess ulceration(s) every visit Notes: [name], [name] ([id number]) Electronic Signature(s) Signed: 04/26/2017 5:11:11 PM By: Alejandro Mulling Entered By: Alejandro Mulling on 04/26/2017 13:44:42 Trueba, [name] ([id number]) -------------------------------------------------------------------------------- Pain Assessment Details Patient Name: [name], BORD. Date of Service: 04/26/2017 1:30 PM Medical Record Number: [id number] Patient Account  Number: [id number] Date of Birth/Sex: [birth year] ([name]) Treating RN: Ashok Cordia, Debi Primary Care Maclane Holloran: Sherrie Mustache Other  Clinician: Referring Elisama Thissen: Sherrie Mustache Treating [place] Monds/Extender: Maxwell Caul Weeks in Treatment: 2 Active Problems Location of Pain Severity and Description of Pain Patient Has Paino No Site Locations With Dressing Change: No Pain Management and Medication Current Pain Management: Electronic Signature(s) Signed: 04/26/2017 5:11:11 PM By: Alejandro Mulling Entered By: Alejandro Mulling on 04/26/2017 13:29:47 [place], [name] ([id number]) -------------------------------------------------------------------------------- Patient/Caregiver Education Details Patient Name: [name], PULLARA. Date of Service: 04/26/2017 1:30 PM Medical Record Patient Account Number: [id number] Number: Treating RN: Phillis Haggis 04-Feb-1951 (66 y.o. Other Clinician: Date of Birth/Gender: Male) Treating ROBSON, MICHAEL Primary Care Physician: Sherrie Mustache Physician/Extender: G Referring Physician: Augustin Coupe in Treatment: 2 Education Assessment Education Provided To: Patient Education Topics Provided Wound/Skin Impairment: Handouts: Other: change dressing as ordered Methods: Demonstration, Explain/Verbal Responses: State content correctly Electronic Signature(s) Signed: 04/26/2017 5:11:11 PM By: Alejandro Mulling Entered By: Alejandro Mulling on 04/26/2017 14:04:10 Duval, [name] ([id number]) -------------------------------------------------------------------------------- Wound Assessment Details Patient Name: [name], Ernest R. Date of Service: 04/26/2017 1:30 PM Medical Record Number: [id number] Patient Account Number: [id number] Date of Birth/Sex: 1951/03/12 ([name]) Treating RN: Ashok Cordia, Debi Primary Care Uriyah Raska: Sherrie Mustache Other Clinician: Referring Datra Clary: Sherrie Mustache Treating Tyson Parkison/Extender: Maxwell Caul Weeks in Treatment: 2 Wound Status Wound Number: 5 Primary Diabetic Wound/Ulcer of the Lower Etiology: Extremity Wound  Location: Left Lower Leg - Anterior Secondary Cellulitis Wounding Event: Gradually Appeared Etiology: Date Acquired: 04/06/2017 Wound Open Weeks Of Treatment: 2 Status: Clustered Wound: No Comorbid Chronic sinus problems/congestion, History: Asthma, Chronic Obstructive Pulmonary Disease (COPD), Sleep Apnea, Congestive Heart Failure, Hypertension, Myocardial Infarction, Type II Diabetes, Osteoarthritis, Confinement Anxiety Photos Photo Uploaded By: Alejandro Mulling on 04/26/2017 17:00:53 Wound Measurements Length: (cm) 0.7 Width: (cm) 0.7 Depth: (cm) 0.1 Area: (cm) 0.385 Volume: (cm) 0.038 % Reduction in Area: 99.1% % Reduction in Volume: 99.1% Epithelialization: None Tunneling: No Undermining: No Wound Description Classification: Grade 1 Wound Margin: Distinct, outline attached Exudate Amount: Large Exudate Type: Serosanguineous Exudate Color: red, brown [name], Bingham R. ([id number]) Foul Odor After Cleansing: No Slough/Fibrino No Wound Bed Granulation Amount: Large (67-100%) Granulation Quality: Red Necrotic Amount: None Present (0%) Periwound Skin Texture Texture Color No Abnormalities Noted: No No Abnormalities Noted: No Moisture Temperature / Pain No Abnormalities Noted: No Temperature: No Abnormality Tenderness on Palpation: Yes Wound Preparation Ulcer Cleansing: Rinsed/Irrigated with Saline, Other: soap and water, Topical Anesthetic Applied: Other: lidocaine 4%, Treatment Notes Wound #5 (Left, Anterior Lower Leg) 1. Cleansed with: Clean wound with Normal Saline Cleanse wound with antibacterial soap and water 4. Dressing Applied: Prisma Ag 5. Secondary Dressing Applied ABD Pad Dry Gauze 7. Secured with Tape 3 Layer Compression System - Right Lower Extremity Notes unna to anchor, TCA Electronic Signature(s) Signed: 04/26/2017 5:11:11 PM By: Alejandro Mulling Entered By: Alejandro Mulling on 04/26/2017 13:43:04 [place]orn, [name]  ([id number]) -------------------------------------------------------------------------------- Vitals Details Patient Name: Ernest Hua. Date of Service: 04/26/2017 1:30 PM Medical Record Number: [id number] Patient Account Number: [id number] Date of Birth/Sex: 07-04-1951 ([name]) Treating RN: Ashok Cordia, Debi Primary Care Jakub Debold: Sherrie Mustache Other Clinician: Referring Elice Crigger: Sherrie Mustache Treating Simonne Boulos/Extender: Maxwell Caul Weeks in Treatment: 2 Vital Signs Time Taken: 13:29 Temperature (F): 98.2 Height (in): 68 Pulse (bpm): 81 Weight (lbs): 346 Respiratory Rate (breaths/min): 22 Body Mass Index (BMI): 52.6 Blood Pressure (mmHg): 159/87 Reference Range: 80 - 120 mg / dl Electronic Signature(s) Signed: 04/26/2017  5:11:11 PM By: Alejandro Mulling Entered By: Alejandro Mulling on 04/26/2017 13:30:05

## 2017-04-28 NOTE — Progress Notes (Signed)
TALIESIN, HARTLAGE ([id number]) Visit Report for 04/26/2017 Chief Complaint Document Details Patient Name: [name], [name]. Date of Service: 04/26/2017 1:30 PM Medical Record Patient Account Number: [id number] Number: Treating RN: Phillis Haggis 1951-04-25 (66 y.o. Other Clinician: Date of Birth/Sex: Male) Treating Jancarlo Biermann Primary Care Provider: Sherrie Mustache Provider/Extender: G Referring Provider: Sherrie Mustache Weeks in Treatment: 2 Information Obtained from: Patient Chief Complaint Patient returns to the wound care center for reopened ulcer to: the right lower extremity with swelling for 1 week. Electronic Signature(s) Signed: 04/26/2017 4:47:51 PM By: Baltazar Najjar MD Entered By: Baltazar Najjar on 04/26/2017 14:00:21 Godbee, [name] ([id number]) -------------------------------------------------------------------------------- HPI Details Patient Name: [name]. Date of Service: 04/26/2017 1:30 PM Medical Record Patient Account Number: [id number] Number: Treating RN: Phillis Haggis [birth year] (66 y.o. Other Clinician: Date of Birth/Sex: Male) Treating Gunther Zawadzki Primary Care Provider: Sherrie Mustache Provider/Extender: G Referring Provider: Sherrie Mustache Weeks in Treatment: 2 History of Present Illness Location: right calf swelling and ulcer Quality: Patient reports experiencing a dull pain to affected area(s). Severity: Patient states wound are getting worse. Duration: Patient has had the wound for 1 week prior to seeking treatment at the wound center Timing: Pain in wound is Intermittent (comes and goes Context: The wound appeared gradually over time Associated Signs and Symptoms: Patient reports having difficulty standing for long periods. HPI Description: 66 year old gentleman who has right lower extremity swelling with ulceration for over a week has had application of compression as per his PCPs orders. He was recently  admitted between 03/18/2017 to 03/26/2017 for shortness of breath, hypoxia and HCAP. He is also known to have acute tubular necrosis, acute renal failure, hypoxia and persistent atrial fibrillation. He was given cefepime for his pneumonia and was also on by mouth prednisone. His most recent hemoglobin A1c was 6.6% He is on a Eliquis. He had a DVT study done on April 25 which was negative but not conclusive Addendum: the ultrasound tech called to say that his DVT study was negative. ====== Old notes: This 66 year old gentleman comes to see [place] for weeping of an ulcer on the left lower extremity. Other comorbidities include morbid obesity, hyperlipidemia, diabetes mellitus type 2, COPD, hypertension, congestive heart failure and prostate problems. He was here in September of last year and was discharged after appropriate treatment for his condition and we had asked him to get vascular studies and he does say that he has had them done but does not know the results. He wears his compression stockings on and off but recently has not been wearing them because they get too tight. We have just obtained his results from the [facility] -- he was last seen there in October 2016 and at that stage his superficial thrombophlebitis on the left lower extremity had resolved and there was no venous reflux or venous disease seen on the right leg on his previous study. He was asked to continue with Plavix and elevation and exercise with compression stockings. No venous intervention would benefit him. We have also reviewed the reflux study done on September 15 for both lower extremities which showed no evidence of right lower extremity deep with thrombosis or superficial thrombophlebitis. No evidence of left lower extremity deep vein thrombosis but there was evidence of left lower extremity superficial thrombophlebitis. No incompetence of the greater small saphenous veins are noted  bilaterally. 12/10/15; this is a patient who came here last week with an ulcer on his left lateral lower extremity  and weeping edema. We have reviewed his venous studies which don't suggest the benefit of venous Saraceno, Devarion R. ([id number]) interventions. His ABI in the last this 1.07. He removed the Unna boot that we put on last week yesterday and there is considerable swelling in the left leg 12/17/15 the patient returns to clinic today with the wounds on the left lateral calf completely resolved. It would appear that during a visit to this clinic last time he did have graded pressure stockings prescribed however he cannot get these on. After some discussion we decided to order him juzzo stockings ====== 04/19/17; patient with what appears to up and a large blister on the right anterior calf. Most of this is epithelialized he still has a scattering of open areas here. He states the Route we plan on last week was unbearably pruritic. He is going to get stockings for next week. He has not had a prior wound issue on the left leg. I note DVT rule out was negative 04/26/17; the blistered area on the right anterior calf is totally healed. Small open area above this appears clean and small. He still does not have stockings. Still using Hydrofera Blue Electronic Signature(s) Signed: 04/26/2017 4:47:51 PM By: Baltazar Najjar MD Entered By: Baltazar Najjar on 04/26/2017 14:01:17 Karow, [name] ([id number]) -------------------------------------------------------------------------------- Physical Exam Details Patient Name: [name]. Date of Service: 04/26/2017 1:30 PM Medical Record Patient Account Number: [id number] Number: Treating RN: Phillis Haggis 1951-07-03 (66 y.o. Other Clinician: Date of Birth/Sex: Male) Treating Quinita Kostelecky Primary Care Provider: Sherrie Mustache Provider/Extender: G Referring Provider: Sherrie Mustache Weeks in Treatment: 2 Constitutional Patient is  hypertensive.. Pulse regular and within target range for patient.Marland Kitchen Respirations regular, non-labored and within target range.. Temperature is normal and within the target range for the patient.Marland Kitchen appears in no distress. Respiratory Respiratory effort is easy and symmetric bilaterally. Rate is normal at rest and on room air.. Cardiovascular Pedal pulses palpable and strong bilaterally.. Edema present in both extremities chronic venous insufficiency edema is well controlled. Lymphatic None palpable in the popliteal or inguinal area. Psychiatric No evidence of depression, anxiety, or agitation. Calm, cooperative, and communicative. Appropriate interactions and affect.. Notes Wound exam; significant chronic venous insufficiency and secondary lymphedema. The right anterior leg open area is totally epithelialized. Small satellite lesion superiorly is still open but appears healthy. Electronic Signature(s) Signed: 04/26/2017 4:47:51 PM By: Baltazar Najjar MD Entered By: Baltazar Najjar on 04/26/2017 14:02:48 Neuser, [name] ([id number]) -------------------------------------------------------------------------------- Physician Orders Details Patient Name: VERDIS, KOVAL. Date of Service: 04/26/2017 1:30 PM Medical Record Patient Account Number: [id number] Number: Treating RN: Phillis Haggis [birth year] (66 y.o. Other Clinician: Date of Birth/Sex: Male) Treating Cristen Murcia Primary Care Provider: Sherrie Mustache Provider/Extender: G Referring Provider: Augustin Coupe in Treatment: 2 Verbal / Phone Orders: Yes Clinician: Pinkerton, Debi Read Back and Verified: Yes Diagnosis Coding Wound Cleansing Wound #5 Left,Anterior Lower Leg o Clean wound with Normal Saline. o Cleanse wound with mild soap and water Anesthetic Wound #5 Left,Anterior Lower Leg o Topical Lidocaine 4% cream applied to wound bed prior to debridement - for clinic use Skin Barriers/Peri-Wound  Care Wound #5 Left,Anterior Lower Leg o Triamcinolone Acetonide Ointment - Please put on leg when change wrap not on wounds Primary Wound Dressing Wound #5 Left,Anterior Lower Leg o Prisma Ag - moisten with saline Secondary Dressing Wound #5 Left,Anterior Lower Leg o ABD pad o Dry Gauze Dressing Change Frequency Wound #5 Left,Anterior Lower Leg o Other: - Change  2 times a week, pt comes to Wound Care Clinic on Wednesdays and [facility] to change another day during the week. Follow-up Appointments Wound #5 Left,Anterior Lower Leg o Return Appointment in 1 week. Edema Control Wound #5 Left,Anterior Lower Leg NOA, CONSTANTE. ([id number]) o 3 Layer Compression System - Right Lower Extremity - unna to anchor wrap 3cm from toes and 3cm from knee o Elevate legs to the level of the heart and pump ankles as often as possible Off-Loading Wound #5 Left,Anterior Lower Leg o Turn and reposition every 2 hours Additional Orders / Instructions Wound #5 Left,Anterior Lower Leg o Increase protein intake. Home Health Wound #5 Left,Anterior Lower Leg o [facility] ******[facility] please measure pts bilateral legs for compression juxtalites...the pt is going to order them himself****** o Home Health Nurse may visit PRN to address patientos wound care needs. o FACE TO FACE ENCOUNTER: MEDICARE and MEDICAID PATIENTS: I certify that this patient is under my care and that I had a face-to-face encounter that meets the physician face-to-face encounter requirements with this patient on this date. The encounter with the patient was in whole or in part for the following MEDICAL CONDITION: (primary reason for Home Healthcare) MEDICAL NECESSITY: I certify, that based on my findings, NURSING services are a medically necessary home health service. HOME BOUND STATUS: I certify that my clinical findings support that this patient is homebound (i.e., Due to illness  or injury, pt requires aid of supportive devices such as crutches, cane, wheelchairs, walkers, the use of special transportation or the assistance of another person to leave their place of residence. There is a normal inability to leave the home and doing so requires considerable and taxing effort. Other absences are for medical reasons / religious services and are infrequent or of short duration when for other reasons). o If current dressing causes regression in wound condition, may D/C ordered dressing product/s and apply Normal Saline Moist Dressing daily until next Wound Healing Center / Other MD appointment. Notify Wound Healing Center of regression in wound condition at [phone]. o Please direct any NON-WOUND related issues/requests for orders to patient's Primary Care Physician Notes ******[facility] please measure pts bilateral legs for compression juxtalites...the pt is going to order them himself****** Electronic Signature(s) Signed: 04/26/2017 4:47:51 PM By: Baltazar Najjar MD Signed: 04/26/2017 5:11:11 PM By: Alejandro Mulling Entered By: Alejandro Mulling on 04/26/2017 14:08:58 Dockstader, [name] ([id number]) -------------------------------------------------------------------------------- Problem List Details Patient Name: CLAIR, ALFIERI. Date of Service: 04/26/2017 1:30 PM Medical Record Patient Account Number: [id number] Number: Treating RN: Phillis Haggis 1950/11/27 (66 y.o. 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 Date of Service: 04/26/2017 1:30 PM Medical Record Patient Account Number: [id number] Number: Treating RN:  Ashok Cordia, Debi [birth year] (66 y.o. Other Clinician: Date of Birth/Sex: Male) Treating Shifra Swartzentruber Primary Care Provider: Sherrie Mustache Provider/Extender: G Referring Provider: Sherrie Mustache Weeks in Treatment: 2 Subjective Chief Complaint Information obtained from Patient Patient returns to the wound care center for reopened ulcer to: the right lower extremity with swelling for 1 week. History of Present Illness (HPI) The following HPI elements were documented for the patient's wound: Location: right calf swelling and ulcer Quality: Patient reports experiencing a dull pain to affected area(s). Severity: Patient states wound are getting worse. Duration: Patient has had the wound for 1 week prior to seeking treatment at the wound center Timing: Pain in wound is Intermittent (comes and goes Context: The wound appeared gradually over time Associated Signs and Symptoms: Patient reports having difficulty standing for long periods. 66 year old gentleman who has right lower extremity swelling with ulceration for over a week has had application of compression as per his PCPs orders. He was recently admitted between 03/18/2017 to 03/26/2017 for shortness of breath, hypoxia and HCAP. He is also  known to have acute tubular necrosis, acute renal failure, hypoxia and persistent atrial fibrillation. He was given cefepime for his pneumonia and was also on by mouth prednisone. His most recent hemoglobin A1c was 6.6% He is on a Eliquis. He had a DVT study done on April 25 which was negative but not conclusive Addendum: the ultrasound tech called to say that his DVT study was negative. ====== Old notes: This 66 year old gentleman comes to see [place] for weeping of an ulcer on the left lower extremity. Other comorbidities include morbid obesity, hyperlipidemia, diabetes mellitus type 2, COPD, hypertension, congestive heart failure and prostate problems. He was here in September of last year and was discharged after appropriate treatment for his condition and we had asked him to get vascular studies and he does say that he has had them done but does not know the results. He wears his compression stockings on and off but recently has not been wearing them because they get too tight. We have just obtained his results from the [facility] -- he was last seen there [place] ([id number]) October 2016 and at that stage his superficial thrombophlebitis on the left lower extremity had resolved and there was no venous reflux or venous disease seen on the right leg on his previous study. He was asked to continue with Plavix and elevation and exercise with compression stockings. No venous intervention would benefit him. We have also reviewed the reflux study done on September 15 for both lower extremities which showed no evidence of right lower extremity deep with thrombosis or superficial thrombophlebitis. No evidence of left lower extremity deep vein thrombosis but there was evidence of left lower extremity superficial thrombophlebitis. No incompetence of the greater small saphenous veins are noted bilaterally. 12/10/15; this is a patient who came here last week with an  ulcer on his left lateral lower extremity and weeping edema. We have reviewed his venous studies which don't suggest the benefit of venous interventions. His ABI in the last this 1.07. He removed the Unna boot that we put on last week yesterday and there is considerable swelling in the left leg 12/17/15 the patient returns to clinic today with the wounds on the left lateral calf completely resolved. It would appear that during a visit to this clinic last time he did have graded pressure stockings prescribed however he cannot get these on. After some discussion we decided to  order him juzzo stockings ====== 04/19/17; patient with what appears to up and a large blister on the right anterior calf. Most of this is epithelialized he still has a scattering of open areas here. He states the Route we plan on last week was unbearably pruritic. He is going to get stockings for next week. He has not had a prior wound issue on the left leg. I note DVT rule out was negative 04/26/17; the blistered area on the right anterior calf is totally healed. Small open area above this appears clean and small. He still does not have stockings. Still using Hydrofera Blue Objective Constitutional Patient is hypertensive.. Pulse regular and within target range for patient.Marland Kitchen. Respirations regular, non-labored and within target range.. Temperature is normal and within the target range for the patient.Marland Kitchen. appears in no distress. Vitals Time Taken: 1:29 PM, Height: 68 in, Weight: 346 lbs, BMI: 52.6, Temperature: 98.2 F, Pulse: 81 bpm, Respiratory Rate: 22 breaths/min, Blood Pressure: 159/87 mmHg. Respiratory Respiratory effort is easy and symmetric bilaterally. Rate is normal at rest and on room air.. Cardiovascular Pedal pulses palpable and strong bilaterally.. Edema present in both extremities chronic venous insufficiency edema is well controlled. Lymphatic None palpable in the popliteal or inguinal  area. Psychiatric Marisa HuaWOODS, Jediah R. ([id number]) No evidence of depression, anxiety, or agitation. Calm, cooperative, and communicative. Appropriate interactions and affect.. General Notes: Wound exam; significant chronic venous insufficiency and secondary lymphedema. The right anterior leg open area is totally epithelialized. Small satellite lesion superiorly is still open but appears healthy. Integumentary (Hair, Skin) Wound #5 status is Open. Original cause of wound was Gradually Appeared. The wound is located on the Left,Anterior Lower Leg. The wound measures 0.7cm length x 0.7cm width x 0.1cm depth; 0.385cm^2 area and 0.038cm^3 volume. There is no tunneling or undermining noted. There is a large amount of serosanguineous drainage noted. The wound margin is distinct with the outline attached to the wound base. There is large (67-100%) red granulation within the wound bed. There is no necrotic tissue within the wound bed. Periwound temperature was noted as No Abnormality. The periwound has tenderness on palpation. Assessment Active Problems ICD-10 E11.622 - Type 2 diabetes mellitus with other skin ulcer E08.59 - Diabetes mellitus due to underlying condition with other circulatory complications I82.401 - Acute embolism and thrombosis of unspecified deep veins of right lower extremity L97.211 - Non-pressure chronic ulcer of right calf limited to breakdown of skin I89.0 - Lymphedema, not elsewhere classified I50.22 - Chronic systolic (congestive) heart failure Plan Wound Cleansing: Wound #5 Left,Anterior Lower Leg: Clean wound with Normal Saline. Cleanse wound with mild soap and water Anesthetic: Wound #5 Left,Anterior Lower Leg: Topical Lidocaine 4% cream applied to wound bed prior to debridement - for clinic use Skin Barriers/Peri-Wound Care: Wound #5 Left,Anterior Lower Leg: Triamcinolone Acetonide Ointment - Please put on leg when change wrap not on wounds Primary Wound  Dressing: Wound #5 Left,Anterior Lower Leg: Marisa HuaWOODS, Nasiir R. ([id number]) Prisma Ag - moisten with saline Secondary Dressing: Wound #5 Left,Anterior Lower Leg: ABD pad Dry Gauze Dressing Change Frequency: Wound #5 Left,Anterior Lower Leg: Other: - Change 2 times a week, [facility]. Follow-up Appointments: Wound #5 Left,Anterior Lower Leg: Return Appointment in 1 week. Edema Control: Wound #5 Left,Anterior Lower Leg: 3 Layer Compression System - Right Lower Extremity - unna to anchor wrap 3cm from toes and 3cm from knee Elevate legs to the level  of the heart and pump ankles as often as possible Off-Loading: Wound #5 Left,Anterior Lower Leg: Turn and reposition every 2 hours Additional Orders / Instructions: Wound #5 Left,Anterior Lower Leg: Increase protein intake. Home Health: Wound #5 Left,Anterior Lower Leg: [facility] ******[facility] please measure pts bilateral legs for compression juxtalites...the pt is going to order them himself****** Home Health Nurse may visit PRN to address patient s wound care needs. FACE TO FACE ENCOUNTER: MEDICARE and MEDICAID PATIENTS: I certify that this patient is under my care and that I had a face-to-face encounter that meets the physician face-to-face encounter requirements with this patient on this date. The encounter with the patient was in whole or in part for the following MEDICAL CONDITION: (primary reason for Home Healthcare) MEDICAL NECESSITY: I certify, that based on my findings, NURSING services are a medically necessary home health service. HOME BOUND STATUS: I certify that my clinical findings support that this patient is homebound (i.e., Due to illness or injury, pt requires aid of supportive devices such as crutches, cane, wheelchairs, walkers, the use of special transportation or the assistance of another person to leave  their place of residence. There is a normal inability to leave the home and doing so requires considerable and taxing effort. Other absences are for medical reasons / religious services and are infrequent or of short duration when for other reasons). If current dressing causes regression in wound condition, may D/C ordered dressing product/s and apply Normal Saline Moist Dressing daily until next Wound Healing Center / Other MD appointment. Notify Wound Healing Center of regression in wound condition at [phone]. Please direct any NON-WOUND related issues/requests for orders to patient's Primary Care Physician General Notes: ******[facility] please measure pts bilateral legs for compression juxtalites...the pt is going to order them himself****** TRAVEION, RUDDOCK ([id number]) #1 we applied Hydrofera Blue/ABDs/3 layer compression #2 stockings next week Electronic Signature(s) Signed: 04/27/2017 8:23:38 AM By: Elliot Gurney, BSN, RN, CWS, Kim RN, BSN Signed: 04/27/2017 8:29:14 AM By: Baltazar Najjar MD Previous Signature: 04/26/2017 4:47:51 PM Version By: Baltazar Najjar MD Entered By: Elliot Gurney, BSN, RN, CWS, Kim on 04/27/2017 08:23:38 KHIREE, BUKHARI ([id number]) -------------------------------------------------------------------------------- SuperBill Details Patient Name: KARRINGTON, STUDNICKA. Date of Service: 04/26/2017 Medical Record Patient Account Number: [id number] Number: Treating RN: Phillis Haggis [birth year] (66 y.o. Other Clinician: Date of Birth/Sex: Male) Treating Tanga Gloor Primary Care Provider: Sherrie Mustache Provider/Extender: G Referring Provider: Sherrie Mustache Weeks in Treatment: 2 Diagnosis Coding ICD-10 Codes Code Description E11.622 Type 2 diabetes mellitus with other skin ulcer E08.59 Diabetes mellitus due to underlying condition with other circulatory complications I82.401 Acute embolism and thrombosis of unspecified deep veins of right lower extremity L97.211  Non-pressure chronic ulcer of right calf limited to breakdown of skin I89.0 Lymphedema, not elsewhere classified I50.22 Chronic systolic (congestive) heart failure Facility Procedures CPT4: Description Modifier Quantity Code [id number] (Facility Use Only) [phone] - APPLY MULTLAY COMPRS LWR RT 1 LEG Physician Procedures CPT4 Code: [id number] Description: 99213 - WC PHYS LEVEL 3 - EST PT ICD-10 Description Diagnosis E11.622 Type 2 diabetes mellitus with other skin ulcer I89.0 Lymphedema, not elsewhere classified Modifier: Quantity: 1 Electronic Signature(s) Signed: 04/26/2017 4:47:51 PM By: Baltazar Najjar MD Signed: 04/26/2017 5:11:11 PM By: Alejandro Mulling Entered By: Alejandro Mulling on 04/26/2017 14:11:08

## 2017-05-03 ENCOUNTER — Telehealth: Payer: Self-pay

## 2017-05-03 MED ORDER — IPRATROPIUM-ALBUTEROL 0.5-2.5 (3) MG/3ML IN SOLN: 3.0000 mL | Freq: Three times a day (TID) | RESPIRATORY_TRACT | 6 refills | Status: AC

## 2017-05-03 NOTE — Telephone Encounter (Signed)
Pt want refill on [place]. Refill submitted.

## 2017-05-03 NOTE — Telephone Encounter (Signed)
Pt needs Albuterol refill called to [facility]

## 2017-05-04 ENCOUNTER — Encounter: Payer: Medicare Other | Admitting: Internal Medicine

## 2017-05-04 DIAGNOSIS — E11622 Type 2 diabetes mellitus with other skin ulcer: Secondary | ICD-10-CM | POA: Diagnosis not present

## 2017-05-05 ENCOUNTER — Encounter: Payer: Self-pay | Admitting: *Deleted

## 2017-05-05 ENCOUNTER — Other Ambulatory Visit: Payer: Self-pay | Admitting: *Deleted

## 2017-05-05 NOTE — Patient Outreach (Signed)
Three unsuccessful phone call attempts made to contact pt- follow up on referral from [facility] hospital liaison Janci RN - recent hospitalization May 11-15, 2018 for sob, hypoxia, health care pneumonia, COPD exacerbation.   Unable to contact letter sent 04/21/17- no response in 10 business days, [facility] close case.   Plan:  RN CM to close case- unable to contact pt.            Plan to inform Dr. Dario GuardianJadali of case closure           [facility] to close case.   Shayne Alkenose M.   Yotam Rhine RN CCM [facility]  [phone]

## 2017-05-06 NOTE — Progress Notes (Signed)
[name], Rome R. ([id number]) Visit Report for 05/04/2017 Chief Complaint Document Details Patient Name: [name], Mauricio R. Date of Service: 05/04/2017 2:30 PM Medical Record Patient Account Number: [id number] Number: Treating RN: Phillis Haggisinkerton, Debi 1951-08-08 (66 y.o. Other Clinician: Date of Birth/Sex: Male) Treating Duayne Brideau Primary Care Provider: Sherrie MustacheJADALI, FAYEGH Provider/Extender: G Referring Provider: Sherrie MustacheJADALI, FAYEGH Weeks in Treatment: 3 Information Obtained from: Patient Chief Complaint Patient returns to the wound care center for reopened ulcer to: the right lower extremity with swelling for 1 week. Electronic Signature(s) Signed: 05/04/2017 5:06:34 PM By: Baltazar Najjarobson, Keian Odriscoll MD Entered By: Baltazar Najjarobson, Esli Jernigan on 05/04/2017 16:30:26 Kerney, York SpanielGORDON R. ([id number]) -------------------------------------------------------------------------------- HPI Details Patient Name: [name], Silas R. Date of Service: 05/04/2017 2:30 PM Medical Record Patient Account Number: [id number] Number: Treating RN: Phillis Haggisinkerton, Debi 1951-08-08 (66 y.o. Other Clinician: Date of Birth/Sex: Male) Treating Kloey Cazarez Primary Care Provider: Sherrie MustacheJADALI, FAYEGH Provider/Extender: G Referring Provider: Sherrie MustacheJADALI, FAYEGH Weeks in Treatment: 3 History of Present Illness Location: right calf swelling and ulcer Quality: Patient reports experiencing a dull pain to affected area(s). Severity: Patient states wound are getting worse. Duration: Patient has had the wound for 1 week prior to seeking treatment at the wound center Timing: Pain in wound is Intermittent (comes and goes Context: The wound appeared gradually over time Associated Signs and Symptoms: Patient reports having difficulty standing for long periods. HPI Description: 66 year old gentleman who has right lower extremity swelling with ulceration for over a week has had application of compression as per his PCPs orders. He was recently  admitted between 03/18/2017 to 03/26/2017 for shortness of breath, hypoxia and HCAP. He is also known to have acute tubular necrosis, acute renal failure, hypoxia and persistent atrial fibrillation. He was given cefepime for his pneumonia and was also on by mouth prednisone. His most recent hemoglobin A1c was 6.6% He is on a Eliquis. He had a DVT study done on April 25 which was negative but not conclusive Addendum: the ultrasound tech called to say that his DVT study was negative. ====== Old notes: This 66 year old gentleman comes to see us for weeping of an ulcer on the left lower extremity. Other comorbidities include morbid obesity, hyperlipidemia, diabetes mellitus type 2, COPD, hypertension, congestive heart failure and prostate problems. He was here in September of last year and was discharged after appropriate treatment for his condition and we had asked him to get vascular studies and he does say that he has had them done but does not know the results. He wears his compression stockings on and off but recently has not been wearing them because they get too tight. We have just obtained his results from the [facility] -- he was last seen there in October 2016 and at that stage his superficial thrombophlebitis on the left lower extremity had resolved and there was no venous reflux or venous disease seen on the right leg on his previous study. He was asked to continue with Plavix and elevation and exercise with compression stockings. No venous intervention would benefit him. We have also reviewed the reflux study done on September 15 for both lower extremities which showed no evidence of right lower extremity deep with thrombosis or superficial thrombophlebitis. No evidence of left lower extremity deep vein thrombosis but there was evidence of left lower extremity superficial thrombophlebitis. No incompetence of the greater small saphenous veins are noted  bilaterally. 12/10/15; this is a patient who came here last week with an ulcer on his left lateral lower extremity  and weeping edema. We have reviewed his venous studies which don't suggest the benefit of venous Radovich, Lemarcus R. ([id number]) interventions. His ABI in the last this 1.07. He removed the Unna boot that we put on last week yesterday and there is considerable swelling in the left leg 12/17/15 the patient returns to clinic today with the wounds on the left lateral calf completely resolved. It would appear that during a visit to this clinic last time he did have graded pressure stockings prescribed however he cannot get these on. After some discussion we decided to order him juzzo stockings ====== 04/19/17; patient with what appears to up and a large blister on the right anterior calf. Most of this is epithelialized he still has a scattering of open areas here. He states the Route we plan on last week was unbearably pruritic. He is going to get stockings for next week. He has not had a prior wound issue on the left leg. I note DVT rule out was negative 04/26/17; the blistered area on the right anterior calf is totally healed. Small open area above this appears clean and small. He still does not have stockings. Still using Hydrofera Blue 05/03/17 large blistered area on the right anterior calf and subsequent wound is totally healed. Small open areas above this are also healed. He has stockings for the right leg. 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 Particularly worrisome is an area on the right lateral ankle extending in the right lateral foot which is the thickened hypertrophied skin so typical of these patients. He has at high risk for recurrence. Electronic Signature(s) Signed: 05/04/2017 5:06:34 PM By: Baltazar Najjar MD Entered By: Baltazar Najjar on 05/04/2017 16:31:43 Sena, [name]  ([id number]) -------------------------------------------------------------------------------- Physical Exam Details Patient Name: JATAVIUS, ELLENWOOD. Date of Service: 05/04/2017 2:30 PM Medical Record Patient Account Number: [id number] Number: Treating RN: Phillis Haggis [birth year] (66 y.o. Other Clinician: Date of Birth/Sex: Male) Treating Chelise Hanger Primary Care Provider: Sherrie Mustache Provider/Extender: G Referring Provider: Sherrie Mustache Weeks in Treatment: 3 Constitutional Patient is hypertensive.. Pulse regular and within target range for patient.Marland Kitchen Respirations regular, non-labored and within target range.. Temperature is normal and within the target range for the patient.Marland Kitchen appears in no distress. Notes Wound exam; he has no open wound area. Considerable and significant chronic venous insufficiency and secondary lymphedema. Electronic Signature(s) Signed: 05/04/2017 5:06:34 PM By: Baltazar Najjar MD Entered By: Baltazar Najjar on 05/04/2017 16:32:48 Fanning, [name] ([id number]) -------------------------------------------------------------------------------- Physician Orders Details Patient Name: JAIME, GRIZZELL. Date of Service: 05/04/2017 2:30 PM Medical Record Patient Account Number: [id number] Number: Treating RN: Phillis Haggis 1951-08-04 (66 y.o. Other Clinician: Date of Birth/Sex: Male) Treating Treyvone Chelf Primary Care Provider: Sherrie Mustache Provider/Extender: G Referring Provider: Augustin Coupe in Treatment: 3 Verbal / Phone Orders: Yes Clinician: Ashok Cordia, Debi Read Back and Verified: Yes Diagnosis Coding Home Health o D/C Home Health Services - Wound Care Only.Marland KitchenMarland KitchenYou may continue home health because I believe the pt was already being seen by you all before he came to the Wound Care Clinic. Discharge From [facility] Services o Discharge from Wound Care Center - Please wear your compression stockings everyday and take off  at night. Clean area clean, dry and lubricated. Please call our office if you have any questions or concerns. Electronic Signature(s) Signed: 05/04/2017 4:41:02 PM By: Alejandro Mulling Signed: 05/04/2017 5:06:34 PM By: Baltazar Najjar MD Entered By: Alejandro Mulling on  05/04/2017 15:19:02 ABRAN, GAVIGAN ([id number]) -------------------------------------------------------------------------------- Problem List Details Patient Name: KESTER, STIMPSON. Date of Service: 05/04/2017 2:30 PM Medical Record Patient Account Number: [id number] Number: Treating RN: Phillis Haggis 04-24-1951 (66 y.o. 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 Date of Service: 05/04/2017 2:30 PM Medical Record Patient Account Number: [id number] Number: Treating RN: Phillis Haggis [birth year] (66 y.o. Other Clinician: Date of Birth/Sex: Male) Treating Treyvone Chelf Primary Care  Provider: Sherrie Mustache Provider/Extender: G Referring Provider: Sherrie Mustache Weeks in Treatment: 3 Subjective Chief Complaint Information obtained from Patient Patient returns to the wound care center for reopened ulcer to: the right lower extremity with swelling for 1 week. History of Present Illness (HPI) The following HPI elements were documented for the patient's wound: Location: right calf swelling and ulcer Quality: Patient reports experiencing a dull pain to affected area(s). Severity: Patient states wound are getting worse. Duration: Patient has had the wound for 1 week prior to seeking treatment at the wound center Timing: Pain in wound is Intermittent (comes and goes Context: The wound appeared gradually over time Associated Signs and Symptoms: Patient reports having difficulty standing for long periods. 66 year old gentleman who has right lower extremity swelling with ulceration for over a week has had application of compression as per his PCPs orders. He was recently admitted between 03/18/2017 to 03/26/2017 for shortness of breath, hypoxia and HCAP. He is also known to have acute tubular necrosis, acute renal failure, hypoxia and persistent atrial fibrillation. He was given cefepime for his pneumonia and was also on by mouth prednisone. His most recent hemoglobin A1c was 6.6% He is on a Eliquis. He had a DVT study done on April 25 which was negative but not conclusive Addendum: the ultrasound tech called to say that his DVT study was negative. ====== Old notes: This 66 year old gentleman comes to see [place] for weeping of an ulcer on the left lower extremity. Other comorbidities include morbid obesity, hyperlipidemia, diabetes mellitus type 2, COPD, hypertension, congestive heart failure and prostate problems. He was here in September of last year and was discharged after appropriate treatment for his condition and we had asked him to get vascular studies and he does say  that he has had them done but does not know the results. He wears his compression stockings on and off but recently has not been wearing them because they get too tight. We have just obtained his results from the [facility] -- he was last seen there in ZACH[place], MANGUAL ([id number]) October 2016 and at that stage his superficial thrombophlebitis on the left lower extremity had resolved and there was no venous reflux or venous disease seen on the right leg on his previous study. He was asked to  continue with Plavix and elevation and exercise with compression stockings. No venous intervention would benefit him. We have also reviewed the reflux study done on September 15 for both lower extremities which showed no evidence of right lower extremity deep with thrombosis or superficial thrombophlebitis. No evidence of left lower extremity deep vein thrombosis but there was evidence of left lower extremity superficial thrombophlebitis. No incompetence of the greater small saphenous veins are noted bilaterally. 12/10/15; this is a patient who came here last week with an ulcer on his left lateral lower extremity and weeping edema. We have reviewed his venous studies which don't suggest the benefit of venous interventions. His ABI in the last this 1.07. He removed the Unna boot that we put on last week yesterday and there is considerable swelling in the left leg 12/17/15 the patient returns to clinic today with the wounds on the left lateral calf completely resolved. It would appear that during a visit to this clinic last time he did have graded pressure stockings prescribed however he cannot get these on. After some discussion we decided to order him juzzo stockings ====== 04/19/17; patient with what appears to up and a large blister on the right anterior calf. Most of this is epithelialized he still has a scattering of open areas here. He states the Route we plan on last week  was unbearably pruritic. He is going to get stockings for next week. He has not had a prior wound issue on the left leg. I note DVT rule out was negative 04/26/17; the blistered area on the right anterior calf is totally healed. Small open area above this appears clean and small. He still does not have stockings. Still using Hydrofera Blue 05/03/17 large blistered area on the right anterior calf and subsequent wound is totally healed. Small open areas above this are also healed. He has stockings for the right leg. 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 Particularly worrisome is an area on the right lateral ankle extending in the right lateral foot which is the thickened hypertrophied skin so typical of these patients. He has at high risk for recurrence. Objective Constitutional Patient is hypertensive.. Pulse regular and within target range for patient.Marland Kitchen Respirations regular, non-labored and within target range.. Temperature is normal and within the target range for the patient.Marland Kitchen appears in no distress. Vitals Time Taken: 2:42 PM, Height: 68 in, Weight: 346 lbs, BMI: 52.6, Temperature: 98.1 F, Pulse: 87 bpm, Respiratory Rate: 20 breaths/min, Blood Pressure: 176/88 mmHg. General Notes: Wound exam; he has no open wound area. Considerable and significant chronic venous insufficiency and secondary lymphedema. ABIEL, ANTRIM ([id number]) Integumentary (Hair, Skin) Wound #5 status is Open. Original cause of wound was Gradually Appeared. The wound is located on the Left,Anterior Lower Leg. The wound measures 0cm length x 0cm width x 0cm depth; 0cm^2 area and 0cm^3 volume. There is no tunneling or undermining noted. There is a none present amount of drainage noted. The wound margin is distinct with the outline attached to the wound base. There is no granulation within the wound bed. There is no necrotic  tissue within the wound bed. Periwound temperature was noted as No Abnormality. The periwound has tenderness on palpation. Assessment Active Problems ICD-10 E11.622 - Type 2 diabetes mellitus with other skin ulcer E08.59 - Diabetes mellitus due to underlying condition with other circulatory complications I82.401 - Acute embolism and thrombosis of unspecified deep veins of right lower  extremity L97.211 - Non-pressure chronic ulcer of right calf limited to breakdown of skin I89.0 - Lymphedema, not elsewhere classified I50.22 - Chronic systolic (congestive) heart failure Plan Home Health: D/C Home Health Services - Wound Care Only.Marland KitchenMarland KitchenYou may continue home health because I believe the pt was already being seen by you all before he came to the Wound Care Clinic. Discharge From [facility] Services: Discharge from Wound Care Center - Please wear your compression stockings everyday and take off at night. Clean area clean, dry and lubricated. Please call our office if you have any questions or concerns. #1 we discharged this patient into his own pressure stockings #2 emphasis placed on skin lubrication especially in the right foot and ankle. #3 the severe damage to the skin on the lateral right ankle and right lateral foot does not bode well for continued skin integrity. We'll have to see how this goes JODI, KAPPES ([id number]) Electronic Signature(s) Signed: 05/04/2017 5:06:34 PM By: Baltazar Najjar MD Entered By: Baltazar Najjar on 05/04/2017 16:34:04 Marcou, [name] ([id number]) -------------------------------------------------------------------------------- SuperBill Details Patient Name: ROMIN, DIVITA. Date of Service: 05/04/2017 Medical Record Patient Account Number: [id number] Number: Treating RN: Phillis Haggis 17-Apr-1951 (66 y.o. Other Clinician: Date of Birth/Sex: Male) Treating Aylinn Rydberg Primary Care Provider: Sherrie Mustache Provider/Extender: G Referring  Provider: Sherrie Mustache Weeks in Treatment: 3 Diagnosis Coding ICD-10 Codes Code Description E11.622 Type 2 diabetes mellitus with other skin ulcer E08.59 Diabetes mellitus due to underlying condition with other circulatory complications I82.401 Acute embolism and thrombosis of unspecified deep veins of right lower extremity L97.211 Non-pressure chronic ulcer of right calf limited to breakdown of skin I89.0 Lymphedema, not elsewhere classified I50.22 Chronic systolic (congestive) heart failure Facility Procedures CPT4 Code: [id number] Description: 32440 - WOUND CARE VISIT-LEV 3 EST PT Modifier: Quantity: 1 Physician Procedures CPT4 Code Description: [id number] - WC PHYS LEVEL 2 - EST PT ICD-10 Description Diagnosis E11.622 Type 2 diabetes mellitus with other skin ulcer L97.211 Non-pressure chronic ulcer of right calf limited t Modifier: o breakdown of Quantity: 1 skin Electronic Signature(s) Signed: 05/04/2017 5:06:34 PM By: Baltazar Najjar MD Entered By: Baltazar Najjar on 05/04/2017 16:38:28

## 2017-05-06 NOTE — Progress Notes (Signed)
[name], [name] ([id number]) Visit Report for 05/04/2017 Arrival Information Details Patient Name: [name], [name]. Date of Service: 05/04/2017 2:30 PM Medical Record Number: [id number] Patient Account Number: [id number] Date of Birth/Sex: May 13, 1951 ([name]) Treating RN: [name] Primary Care [name]: [name] Other Clinician: Referring [name]: [name] Treating [name]/Extender: [name] in Treatment: 3 Visit Information History Since Last Visit All ordered tests and consults were completed: No Patient Arrived: Wheel Chair Added or deleted any medications: No Arrival Time: 14:41 Any new allergies or adverse reactions: No Accompanied By: self Had a fall or experienced change in No Transfer Assistance: EasyPivot Patient activities of daily living that may affect Lift risk of falls: Patient Identification Verified: Yes Signs or symptoms of abuse/neglect since last No Secondary Verification Process Yes visito Completed: Hospitalized since last visit: No Patient Requires Transmission- No Has Dressing in Place as Prescribed: Yes Based Precautions: Has Compression in Place as Prescribed: Yes Patient Has Alerts: Yes Pain Present Now: No Patient Alerts: Patient on Blood Thinner DM II Eliquis R ABI non- compressible Electronic Signature(s) Signed: 05/04/2017 4:41:02 PM By: [name] Entered By: [name] on 05/04/2017 14:41:48 [name], [name] ([id number]) -------------------------------------------------------------------------------- Clinic Level of Care Assessment Details Patient Name: [name]. Date of Service: 05/04/2017 2:30 PM Medical Record Number: [id number] Patient Account Number: [id number] Date of Birth/Sex: 18-Sep-1951 ([name]) Treating RN: [name], [name] Primary Care [name]: [name] Other Clinician: Referring [name]: [name] Treating [name]/Extender: Ernest  in Treatment: 3 Clinic Level of Care Assessment Items TOOL 4 Quantity Score X - Use when only an EandM is performed on FOLLOW-UP visit 1 0 ASSESSMENTS - Nursing Assessment / Reassessment X - Reassessment of Co-morbidities (includes updates in patient status) 1 10 X - Reassessment of Adherence to Treatment Plan 1 5 ASSESSMENTS - Wound and Skin Assessment / Reassessment X - Simple Wound Assessment / Reassessment - one wound 1 5 []  - Complex Wound Assessment / Reassessment - multiple wounds 0 []  - Dermatologic / Skin Assessment (not related to wound area) 0 ASSESSMENTS - Focused Assessment X - Circumferential Edema Measurements - multi extremities 1 5 []  - Nutritional Assessment / Counseling / Intervention 0 []  - Lower Extremity Assessment (monofilament, tuning fork, pulses) 0 []  - Peripheral Arterial Disease Assessment (using hand held doppler) 0 ASSESSMENTS - Ostomy and/or Continence Assessment and Care []  - Incontinence Assessment and Management 0 []  - Ostomy Care Assessment and Management (repouching, etc.) 0 PROCESS - Coordination of Care []  - Simple Patient / Family Education for ongoing care 0 X - Complex (extensive) Patient / Family Education for ongoing care 1 20 X - Staff obtains Chiropractor, Records, Test Results / Process Orders 1 10 X - Staff telephones HHA, Nursing Homes / Clarify orders / etc 1 10 []  - Routine Transfer to another Facility (non-emergent condition) 0 [name], Ernest R. ([id number]) []  - Routine Hospital Admission (non-emergent condition) 0 []  - New Admissions / Manufacturing engineer / Ordering NPWT, Apligraf, etc. 0 []  - Emergency Hospital Admission (emergent condition) 0 X - Simple Discharge Coordination 1 10 []  - Complex (extensive) Discharge Coordination 0 PROCESS - Special Needs []  - Pediatric / Minor Patient Management 0 []  - Isolation Patient Management 0 []  - Hearing / Language / Visual special needs 0 []  - Assessment of Community assistance  (transportation, D/C planning, etc.) 0 []  - Additional assistance / Altered mentation 0 []  - Support Surface(s) Assessment (bed, cushion, seat, etc.) 0 INTERVENTIONS -  Wound Cleansing / Measurement X - Simple Wound Cleansing - one wound 1 5 []  - Complex Wound Cleansing - multiple wounds 0 X - Wound Imaging (photographs - any number of wounds) 1 5 []  - Wound Tracing (instead of photographs) 0 []  - Simple Wound Measurement - one wound 0 []  - Complex Wound Measurement - multiple wounds 0 INTERVENTIONS - Wound Dressings []  - Small Wound Dressing one or multiple wounds 0 []  - Medium Wound Dressing one or multiple wounds 0 []  - Large Wound Dressing one or multiple wounds 0 []  - Application of Medications - topical 0 []  - Application of Medications - injection 0 INTERVENTIONS - Miscellaneous []  - External ear exam 0 [name], Ernest R. ([id number]) []  - Specimen Collection (cultures, biopsies, blood, body fluids, etc.) 0 []  - Specimen(s) / Culture(s) sent or taken to Lab for analysis 0 []  - Patient Transfer (multiple staff / Michiel Sites Lift / Similar devices) 0 []  - Simple Staple / Suture removal (25 or less) 0 []  - Complex Staple / Suture removal (26 or more) 0 []  - Hypo / Hyperglycemic Management (close monitor of Blood Glucose) 0 []  - Ankle / Brachial Index (ABI) - do not check if billed separately 0 X - Vital Signs 1 5 Has the patient been seen at the hospital within the last three years: Yes Total Score: 90 Level Of Care: New/Established - Level 3 Electronic Signature(s) Signed: 05/04/2017 4:41:02 PM By: [name] Entered By: [name] on 05/04/2017 15:16:12 [name], [name] ([id number]) -------------------------------------------------------------------------------- Encounter Discharge Information Details Patient Name: [name], [name] Date of Service: 05/04/2017 2:30 PM Medical Record Number: [id number] Patient Account Number: [id number] Date of Birth/Sex: 1951/07/27 (66 y.o.  Male) Treating RN: [name] Primary Care Brihana Quickel: [name] Other Clinician: Referring Trevonte Ashkar: [name] Treating Brandyce Dimario/Extender: Ernest Sandston in Treatment: 3 Encounter Discharge Information Items Discharge Pain Level: 0 Discharge Condition: Stable Ambulatory Status: Wheelchair Discharge Destination: Home Transportation: Private Auto Accompanied By: brother in law Schedule Follow-up Appointment: No Medication Reconciliation completed and provided to Patient/Care No Trayce Caravello: Provided on Clinical Summary of Care: 05/04/2017 Form Type Recipient Paper Patient GW Electronic Signature(s) Signed: 05/04/2017 4:41:02 PM By: [name] Previous Signature: 05/04/2017 3:08:00 PM Version By: Gwenlyn Perking Entered By: [name] on 05/04/2017 15:20:23 [name], [name] ([id number]) -------------------------------------------------------------------------------- Lower Extremity Assessment Details Patient Name: [name], Ernest R. Date of Service: 05/04/2017 2:30 PM Medical Record Number: [id number] Patient Account Number: [id number] Date of Birth/Sex: 1950-11-09 ([name]) Treating RN: [name] Primary Care Petrona Wyeth: [name] Other Clinician: Referring Ngoc Detjen: [name] Treating Jasir Rother/Extender: Maxwell Caul Weeks in Treatment: 3 Edema Assessment Assessed: [Left: No] [Right: No] E[Left: dema] [Right: :] Calf Left: Right: Point of Measurement: 36 cm From Medial Instep cm 45.9 cm Ankle Left: Right: Point of Measurement: 14 cm From Medial Instep cm 28.5 cm Vascular Assessment Pulses: Posterior Tibial Extremity colors, hair growth, and conditions: Extremity Color: [Right:Hyperpigmented] Temperature of Extremity: [Right:Warm] Capillary Refill: [Right:< 3 seconds] Toe Nail Assessment Left: Right: Thick: Yes Discolored: Yes Deformed: Yes Improper Length and Hygiene: Yes Electronic Signature(s) Signed:  05/04/2017 4:41:02 PM By: [name] Entered By: [name] on 05/04/2017 14:54:29 [name], [name] ([id number]) -------------------------------------------------------------------------------- Multi Wound Chart Details Patient Name: [name]. Date of Service: 05/04/2017 2:30 PM Medical Record Number: [id number] Patient Account Number: [id number] Date of Birth/Sex: April 29, 1951 (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] [N/A:N/A] Secondary Etiology: [5:Cellulitis] [N/A:N/A] Comorbid History: [5:Chronic sinus problems/congestion, Asthma, Chronic Obstructive Pulmonary Disease (COPD), Sleep Apnea, Congestive Heart Failure, Hypertension, Myocardial Infarction, Type II Diabetes, Osteoarthritis, Confinement Anxiety] [N/A:N/A] Date Acquired: [5:04/06/2017] [N/A:N/A] Weeks of Treatment: [5:3] [N/A:N/A] Wound Status: [5:Open] [N/A:N/A] Measurements L x W x D 0x0x0 [N/A:N/A] (cm) Area (cm) : [5:0] [N/A:N/A] Volume (cm) : [5:0] [N/A:N/A] % Reduction in Area: [5:100.00%] [N/A:N/A] % Reduction in Volume: 100.00% [N/A:N/A] Classification: [5:Grade 1] [N/A:N/A] Exudate Amount: [5:None Present] [N/A:N/A] Wound Margin: [5:Distinct, outline attached] [N/A:N/A] Granulation Amount: [5:None Present (0%)] [N/A:N/A] Necrotic Amount: None Present (0%) N/A N/A Epithelialization: Large (67-100%) N/A N/A Periwound Skin Texture: No Abnormalities Noted N/A  N/A Periwound Skin No Abnormalities Noted N/A N/A Moisture: Periwound Skin Color: No Abnormalities Noted N/A N/A Temperature: No Abnormality N/A N/A Tenderness on Yes N/A N/A Palpation: Wound Preparation: Ulcer Cleansing: N/A N/A Rinsed/Irrigated with Saline, Other: soap and water Topical Anesthetic Applied: None Treatment Notes Electronic Signature(s) Signed: 05/04/2017 5:06:34 PM By: Baltazar Najjar MD Entered By: Baltazar Najjar on 05/04/2017 16:30:16 [name], [name] ([id number]) -------------------------------------------------------------------------------- Multi-Disciplinary Care Plan Details Patient Name: [name], [name]. Date of Service: 05/04/2017 2:30 PM Medical Record Number: [id number] Patient Account Number: [id number] Date of Birth/Sex: 12-07-1950 (65 y.o. Male) Treating RN: [name] Primary Care Lambert Jeanty: [name] Other Clinician: Referring Zoei Amison: [name] Treating Clora Ohmer/Extender: Maxwell Caul Weeks in Treatment: 3 Active Inactive Electronic Signature(s) Signed: 05/04/2017 4:41:02 PM By: [name] Entered By: [name] on 05/04/2017 15:53:04 Caputi, [name] ([id number]) -------------------------------------------------------------------------------- Pain Assessment Details Patient Name: GILLIS, BOARDLEY. Date of Service: 05/04/2017 2:30 PM Medical Record Number: [id number] Patient Account Number: [id number] Date of Birth/Sex: [birth year] ([name]) Treating RN: [name] Primary Care Haylynn Pha: [name] Other Clinician: Referring Kamare Caspers: [name] Treating Cailey Trigueros/Extender: Maxwell Caul Weeks in Treatment: 3 Active Problems Location of Pain Severity and Description of Pain Patient Has Paino No Site Locations With Dressing Change: No Pain Management and Medication Current Pain Management: Electronic Signature(s) Signed: 05/04/2017 4:41:02 PM By: [name] Entered By: [name] on 05/04/2017 14:42:06 Campoli, [name] ([id number]) -------------------------------------------------------------------------------- Patient/Caregiver Education Details Patient Name: [name]. Date of Service: 05/04/2017 2:30 PM Medical Record Patient Account Number: [id number] Number: Treating RN: [name] 1951-06-11 (66 y.o. Other Clinician: Date of Birth/Gender: Male) Treating ROBSON, MICHAEL Primary Care Physician: [name] Physician/Extender: G Referring Physician: Augustin Coupe in Treatment: 3 Education Assessment Education Provided To: Patient Education Topics Provided Wound/Skin Impairment: Handouts: Other: Please call our office if you have any questions or concerns. Methods: Explain/Verbal Responses: State content correctly Electronic Signature(s) Signed: 05/04/2017 4:41:02 PM By: [name] Entered By: [name] on 05/04/2017 15:20:51 Luckow, [name] ([id number]) -------------------------------------------------------------------------------- Wound Assessment Details Patient Name: KENTARO, ALEWINE R. Date of Service: 05/04/2017 2:30 PM Medical Record Number: [id number] Patient Account Number: [id number] Date of Birth/Sex: 15-Sep-1951 ([name]) Treating RN: [name], [name] Primary Care Geovanie Winnett: [name] Other Clinician: Referring Michela Herst: [name] Treating Bora Bost/Extender: Maxwell Caul Weeks in Treatment: 3 Wound Status Wound Number: 5 Primary Diabetic Wound/Ulcer of the Lower Etiology: Extremity Wound Location: Left Lower Leg - Anterior Secondary Cellulitis Wounding Event: Gradually Appeared Etiology:  Date Acquired: 04/06/2017 Wound Open Weeks Of Treatment: 3 Status: Clustered Wound: No Comorbid Chronic sinus problems/congestion, History: Asthma, Chronic Obstructive Pulmonary Disease (COPD), Sleep Apnea, Congestive Heart Failure, Hypertension, Myocardial Infarction, Type II  Diabetes, Osteoarthritis, Confinement Anxiety Photos Photo Uploaded By: Ernest MullingPinkerton, Debra on 05/04/2017 16:32:05 Wound Measurements Length: (cm) 0 % Reductio Width: (cm) 0 % Reductio Depth: (cm) 0 Epithelial Area: (cm) 0 Tunneling Volume: (cm) 0 Undermini n in Area: 100% n in Volume: 100% ization: Large (67-100%) : No ng: No Wound Description Classification: Grade 1 Wound Margin: Distinct, outline attached Exudate Amount: None Present Foul Odor After Cleansing: No Slough/Fibrino No Wound Bed Granulation Amount: None Present (0%) Llerena, Aldous R. ([id number]) Necrotic Amount: None Present (0%) Periwound Skin Texture Texture Color No Abnormalities Noted: No No Abnormalities Noted: No Moisture Temperature / Pain No Abnormalities Noted: No Temperature: No Abnormality Tenderness on Palpation: Yes Wound Preparation Ulcer Cleansing: Rinsed/Irrigated with Saline, Other: soap and water, Topical Anesthetic Applied: None Electronic Signature(s) Signed: 05/04/2017 4:41:02 PM By: Ernest MullingPinkerton, Debra Entered By: Ernest MullingPinkerton, Debra on 05/04/2017 14:53:12 Paster, Ernest SpanielGORDON R. ([id number]) -------------------------------------------------------------------------------- Vitals Details Patient Name: Ernest HuaWOODS, Taichi R. Date of Service: 05/04/2017 2:30 PM Medical Record Number: [id number] Patient Account Number: [id number] Date of Birth/Sex: 05/19/1951 [age] y.o. Male) Treating RN: Ernest CordiaPinkerton, [name] Primary Care Lilias Lorensen: Ernest MustacheJADALI, FAYEGH Other Clinician: Referring Dawnetta Copenhaver: Ernest MustacheJADALI, FAYEGH Treating Unika Nazareno/Extender: Maxwell CaulOBSON, MICHAEL G Weeks in Treatment: 3 Vital Signs Time Taken: 14:42 Temperature (F): 98.1 Height (in): 68 Pulse (bpm): 87 Weight (lbs): 346 Respiratory Rate (breaths/min): 20 Body Mass Index (BMI): 52.6 Blood Pressure (mmHg): 176/88 Reference Range: 80 - 120 mg / dl Electronic Signature(s) Signed: 05/04/2017 4:41:02 PM By: Ernest MullingPinkerton, Debra Entered By: Ernest MullingPinkerton, Debra on  05/04/2017 14:48:15

## 2017-05-26 ENCOUNTER — Ambulatory Visit (INDEPENDENT_AMBULATORY_CARE_PROVIDER_SITE_OTHER): Payer: Medicare Other | Admitting: Cardiovascular Disease

## 2017-05-26 ENCOUNTER — Encounter: Payer: Self-pay | Admitting: Cardiovascular Disease

## 2017-05-26 VITALS — BP 149/87 | HR 83 | Ht 66.0 in | Wt 306.1 lb

## 2017-05-26 DIAGNOSIS — R0602 Shortness of breath: Secondary | ICD-10-CM

## 2017-05-26 DIAGNOSIS — I5032 Chronic diastolic (congestive) heart failure: Secondary | ICD-10-CM | POA: Diagnosis not present

## 2017-05-26 DIAGNOSIS — I481 Persistent atrial fibrillation: Secondary | ICD-10-CM

## 2017-05-26 DIAGNOSIS — I4819 Other persistent atrial fibrillation: Secondary | ICD-10-CM

## 2017-05-26 NOTE — Patient Instructions (Signed)
Medication Instructions:   No medication changes made  Labwork:  BMP today  Testing/Procedures:  No further testing at this time   Follow-Up: It was a pleasure seeing you in the office today. Please call us if you have new issues that need to be addressed before your next appt.  [phone]  Your physician wants you to follow-up in: 6 months.  You will receive a reminder letter in the mail two months in advance. If you don't receive a letter, please call our office to schedule the follow-up appointment.  If you need a refill on your cardiac medications before your next appointment, please call your pharmacy.

## 2017-05-26 NOTE — Progress Notes (Signed)
Cardiology Office Note  Date:  05/26/2017   ID:  [name], DOB [birth year], MRN [id number]  PCP:  Sherrie MustacheJadali, Fayegh, MD   Chief Complaint  Patient presents with  . other    1 month f/u pt would like to discuss insulin. Meds reviewed verbally with pt.    HPI:  66 y.o. male with h/o  Atrial fib, persistent Ejection fraction 50-55% by echo May 2018 morbid obesity,  DM II ARF obstructive sleep apnea,  hypertension,  chronic hypercarbic respiratory failure,   COPD with no prior smoking history,  chronic lower extremity edema  who presents for follow up of his shortness of breath, persistent atrial fibrillation  admission 03/01/2017 for  pneumonia, COPD exacerbation. During that hospitalization he converted to atrial fibrillation   fan was blowing in his face, felt it caused sinus congestion, difficulty breathing, presented to the hospital 03/2017,  Back in atrial fib In the hospital,  worsening hypoxia,  low 80s  On his last clinic visit June 2018 weight was up to 336 pounds Worsening shortness of breath, leg swelling He was taking Lasix 40 mg daily We increase Lasix up to 40 mg twice a day  In follow-up today Weight avg 306 at home, highest is 310 Leg swelling improved, shortness of breath improving He does have some chronic leg edema on eliquis For atrial fibrillation  EKG on today's visit shows normal sinus rhythm with rate 83 bpm no significant ST or T-wave changes   Other past medical history reviewed echocardiogram showing low normal ejection fraction 50-55%, moderate to severely elevated right heart pressures  CT scan chest from 2017 reviewed showing minimal coronary calcifications  PMH:   has a past medical history of Asthma; COPD (chronic obstructive pulmonary disease) (HCC); Heart attack (HCC); High blood pressure; High cholesterol; Scoliosis; Seasonal allergies; and Sleep apnea.  PSH:    Past Surgical History:  Procedure Laterality Date  . back sx     L4 L5   . CARPAL TUNNEL RELEASE Right     Current Outpatient Prescriptions  Medication Sig Dispense Refill  . apixaban (ELIQUIS) 5 MG TABS tablet Take 1 tablet (5 mg total) by mouth 2 (two) times daily. 180 tablet 3  . cetirizine (ZYRTEC) 10 MG tablet Take 10 mg by mouth daily.    Marland Kitchen. docusate sodium (COLACE) 100 MG capsule Take 1 capsule (100 mg total) by mouth 2 (two) times daily as needed for mild constipation. 10 capsule 0  . ferrous sulfate 325 (65 FE) MG EC tablet Take 325 mg by mouth daily with breakfast.    . fluticasone (FLONASE) 50 MCG/ACT nasal spray Place 1 spray into both nostrils 2 (two) times daily. 16 g 2  . furosemide (LASIX) 40 MG tablet Take 1 tablet (40 mg total) by mouth 2 (two) times daily. 180 tablet 3  . Insulin Glargine (LANTUS SOLOSTAR) 100 UNIT/ML Solostar Pen Inject 20 Units into the skin daily at 10 pm. 15 mL 11  . ipratropium-albuterol (DUONEB) 0.5-2.5 (3) MG/3ML SOLN Take 3 mLs by nebulization 3 (three) times daily. Dx:J44.9 360 mL 6  . metoprolol tartrate (LOPRESSOR) 25 MG tablet Take 1 tablet (25 mg total) by mouth 2 (two) times daily. 60 tablet 1  . omeprazole (PRILOSEC) 20 MG capsule Take 20 mg by mouth daily.    . ondansetron (ZOFRAN ODT) 4 MG disintegrating tablet Take 1 tablet (4 mg total) by mouth every 8 (eight) hours as needed for nausea or vomiting. 20 tablet 0  .  OXYGEN Inhale 2 L into the lungs continuous.    . polyethylene glycol powder (GLYCOLAX/MIRALAX) powder Dissolve one heaping tablespoon in 4-8 ounces of juice or water and drink once daily. (Patient taking differently: Take 17 g by mouth daily as needed for mild constipation. ) 255 g 0  . pregabalin (LYRICA) 50 MG capsule Take 50 mg by mouth 2 (two) times daily.    . rosuvastatin (CRESTOR) 10 MG tablet Take 10 mg by mouth daily. Reported on 04/22/2016    . tamsulosin (FLOMAX) 0.4 MG CAPS capsule Take 0.4 mg by mouth daily.    . traMADol (ULTRAM) 50 MG tablet Take 1 tablet (50 mg total) by mouth  every 6 (six) hours as needed for moderate pain. 20 tablet 0  . Vitamin D, Ergocalciferol, (DRISDOL) 50000 units CAPS capsule Take 50,000 Units by mouth every 7 (seven) days. Patient takes on Monday     No current facility-administered medications for this visit.      Allergies:   Metformin and related and Morphine and related   Social History:  The patient  reports that he has never smoked. He has never used smokeless tobacco. He reports that he does not drink alcohol or use drugs.   Family History:   family history includes Arthritis in his mother; Asthma in his father; Cancer in his father; Heart disease in his father and mother; Macular degeneration in his brother.    Review of Systems: Review of Systems  Constitutional: Negative.   Respiratory: Negative.   Cardiovascular: Negative.   Gastrointestinal: Negative.   Musculoskeletal: Negative.   Neurological: Negative.   Psychiatric/Behavioral: Negative.   All other systems reviewed and are negative.    PHYSICAL EXAM: VS:  BP (!) 149/87 (BP Location: Right Arm, Patient Position: Sitting, Cuff Size: Large)   Pulse 83   Ht 5\' 6"  (1.676 m)   Wt (!) 306 lb 2 oz (138.9 kg)   BMI 49.41 kg/m  , BMI Body mass index is 49.41 kg/m. GEN: Well nourished, well developed, in no acute distress ,  obese, presenting a wheelchair on nasal cannula oxygen  HEENT: normal  Neck: no JVD, carotid bruits, or masses Cardiac: RRR; no murmurs, rubs, or gallops, trace to 1+ pitting edema To the mid shins Woody edema  to the thighs  Respiratory:  clear to auscultation bilaterally, normal work of breathing GI: soft, nontender, nondistended, + BS MS: no deformity or atrophy  Skin: warm and dry, no rash Neuro:  Strength and sensation are intact Psych: euthymic mood, full affect    Recent Labs: 03/01/2017: ALT 14 03/05/2017: B Natriuretic Peptide 34.0; Magnesium 2.0 03/26/2017: BUN 48; Creatinine, Ser 1.31; Hemoglobin 12.2; Platelets 138; Potassium  4.9; Sodium 143    Lipid Panel No results found for: CHOL, HDL, LDLCALC, TRIG    Wt Readings from Last 3 Encounters:  05/26/17 (!) 306 lb 2 oz (138.9 kg)  04/18/17 (!) 336 lb (152.4 kg)  03/18/17 (!) 348 lb (157.9 kg)       ASSESSMENT AND PLAN:  Persistent atrial fibrillation (HCC) - Plan: EKG 12-Lead On anticoagulation, normal sinus rhythm on today's visit  Chronic hypoxemic respiratory failure (HCC) - Plan: EKG 12-Lead Still on 2 L nasal cannula oxygen BMP today to help guide diuresis Fifth normal renal function will continue Lasix 40 twice a day  Acute renal failure with tubular necrosis (HCC) - Plan: EKG 12-Lead Followed by Dr. Thedore Mins BMP today  Lower extremity edema - Plan: EKG 12-Lead Secondary to pulmonary  hypertension Continue Lasix up to 40 mg twice a day  Morbid obesity (HCC) - Plan: EKG 12-Lead We have encouraged continued exercise, careful diet management in an effort to lose weight.   chronic diastolic CHF Weight is down 30 pounds per the notes We'll continue Lasix 40 twice a day   Total encounter time more than 25 minutes  Greater than 50% was spent in counseling and coordination of care with the patient  Disposition:   F/U  6 months   Orders Placed This Encounter  Procedures  . EKG 12-Lead     Signed, Dossie Arbour, M.D., Ph.D. 05/26/2017  [facility] [place], [place] [phone]

## 2017-05-27 LAB — BASIC METABOLIC PANEL
BUN / CREAT RATIO: 16 (ref 10–24)
BUN: 18 mg/dL (ref 8–27)
CO2: 27 mmol/L (ref 20–29)
CREATININE: 1.13 mg/dL (ref 0.76–1.27)
Calcium: 9.3 mg/dL (ref 8.6–10.2)
Chloride: 90 mmol/L — ABNORMAL LOW (ref 96–106)
GFR, EST AFRICAN AMERICAN: 78 mL/min/{1.73_m2} (ref >59)
GFR, EST NON AFRICAN AMERICAN: 68 mL/min/{1.73_m2} (ref >59)
Glucose: 179 mg/dL — ABNORMAL HIGH (ref 65–99)
Potassium: 4.3 mmol/L (ref 3.5–5.2)
SODIUM: 140 mmol/L (ref 134–144)

## 2017-06-08 IMAGING — CR DG CHEST 2V
1 series · 2 of 2 positions shown · non-contrast
Comparison: 03/05/2017.

CLINICAL DATA: Productive cough.

EXAM:
CHEST  2 VIEW

[Series 1: dg chest 2 view · 0.14mm/px · 2 of 2 slices shown]
[im 1/2]
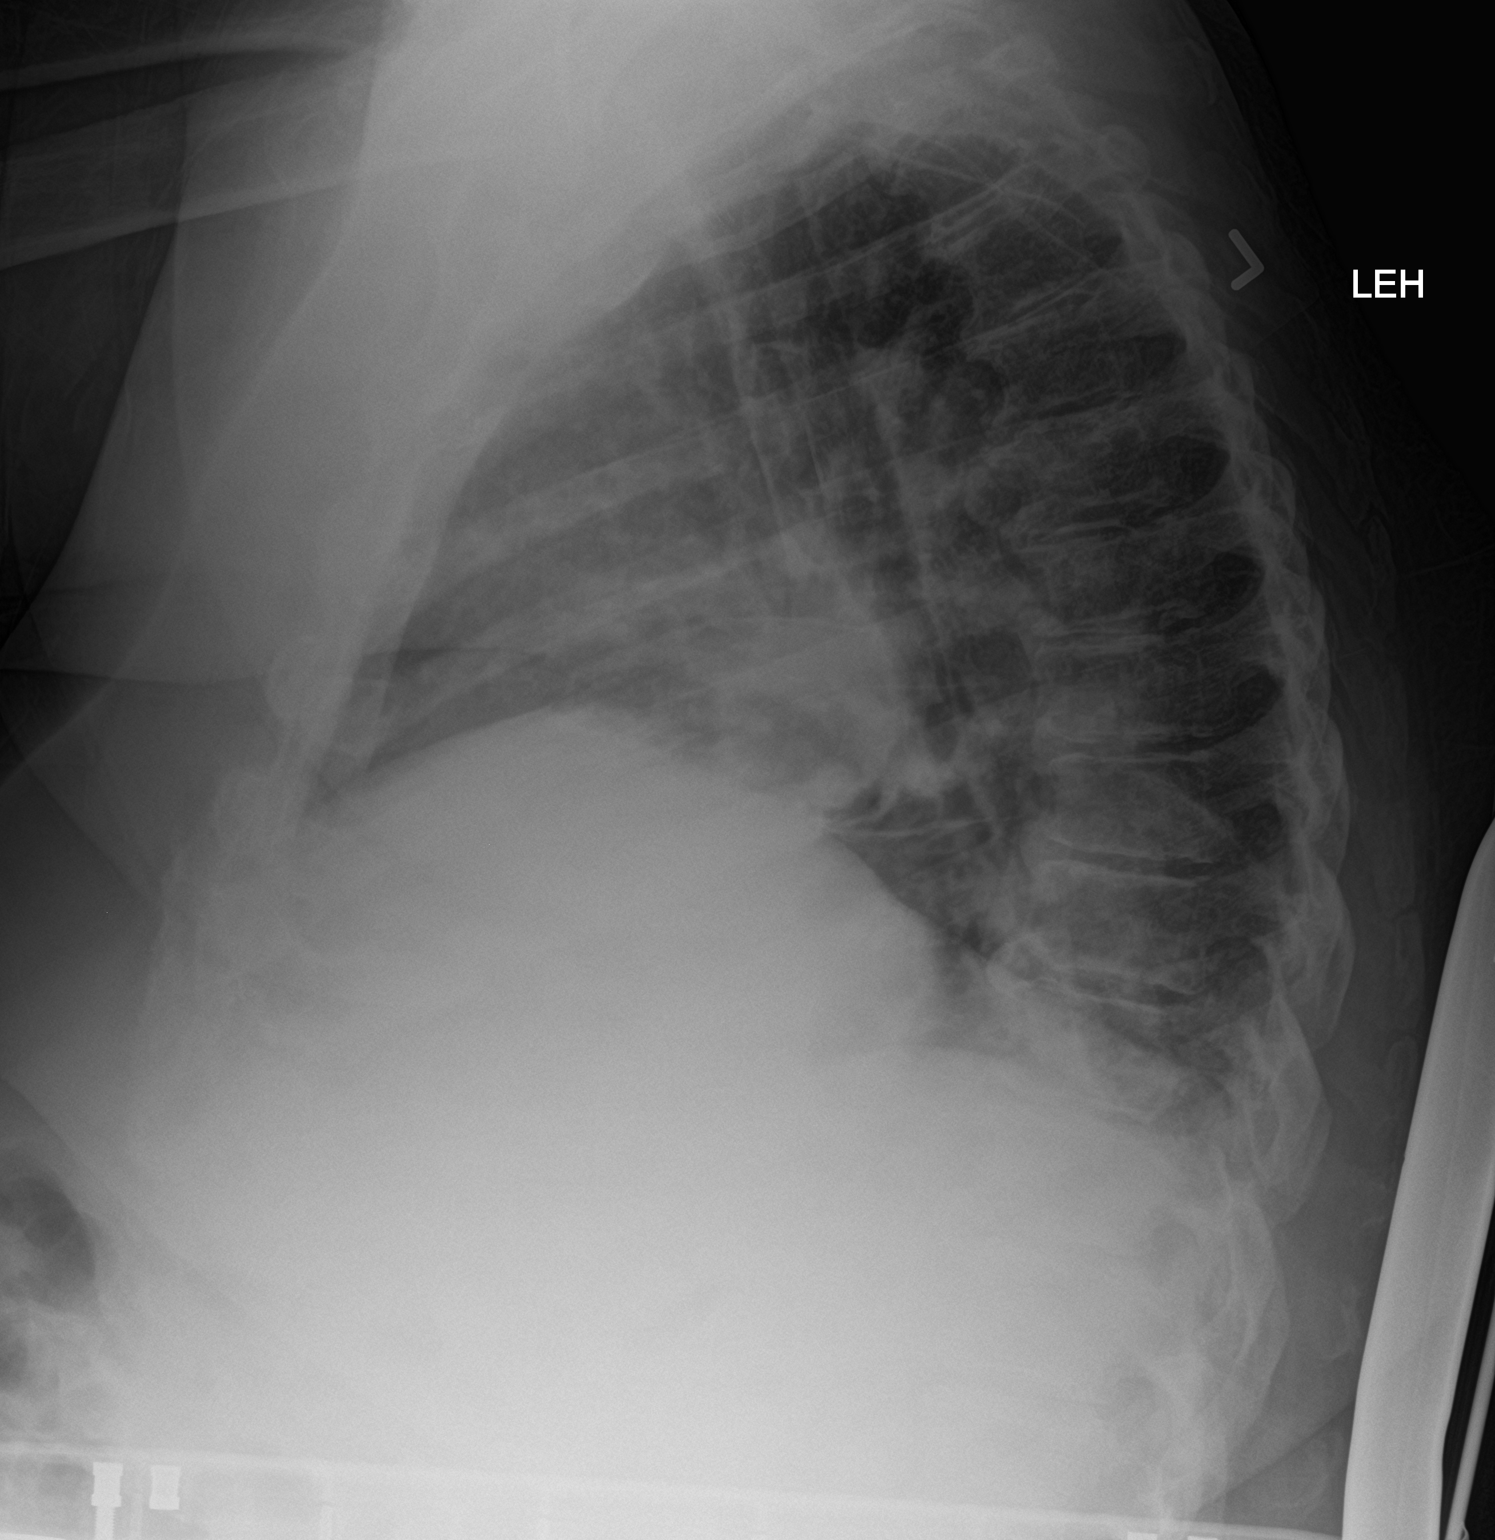
[im 2/2]
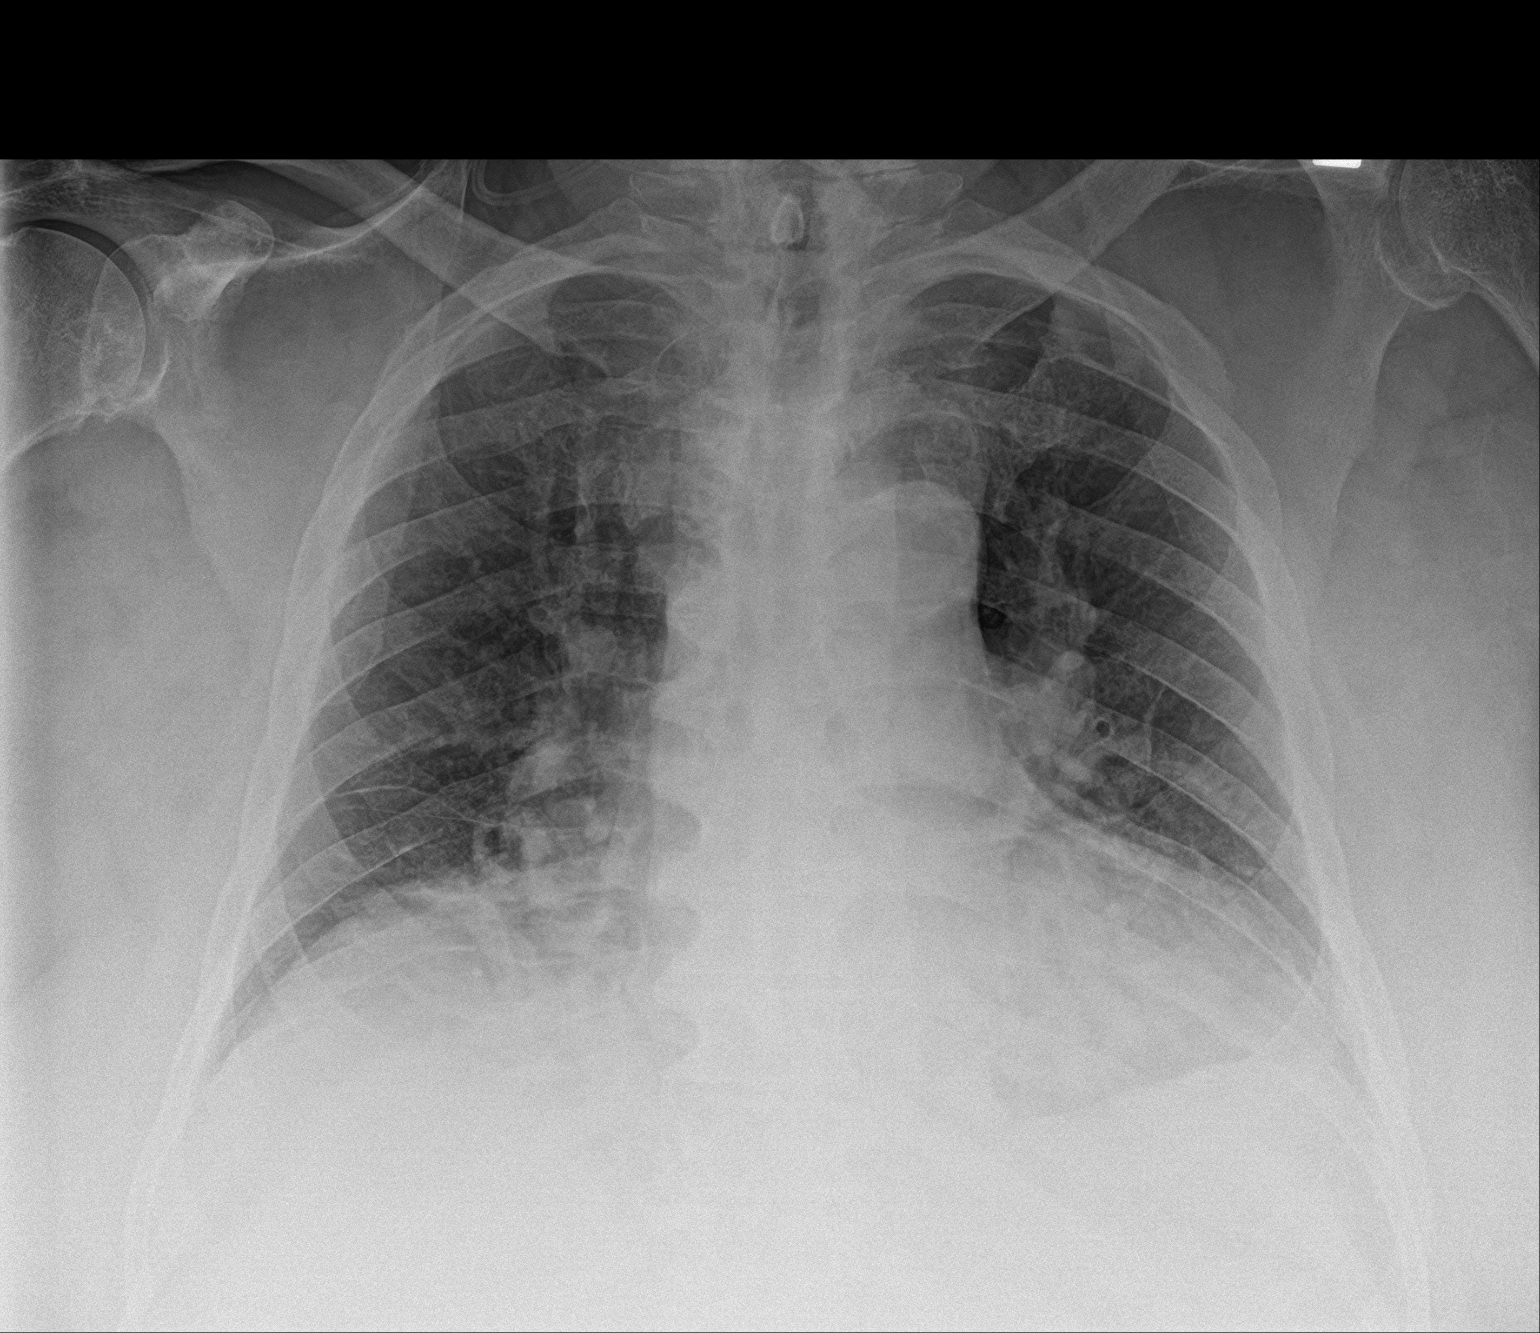

[2 of 2 positions shown; findings below may reference images not displayed]

FINDINGS: Stable mildly enlarged cardiac silhouette. Mildly increased patchy
opacity at the left lung base without significant change in mild
patchy and linear opacity at the right lung base. Mild diffuse
peribronchial thickening and accentuation of the interstitial
markings is unchanged. Aortic arch calcifications and thoracic spine
degenerative changes are again demonstrated.
IMPRESSION: 1. Mildly progressive probable left lower lobe pneumonia.
2. Mildly progressive probable atelectasis at the right lung base.
3. Stable mild bronchitic changes and mild cardiomegaly.

## 2017-09-22 ENCOUNTER — Other Ambulatory Visit: Payer: Self-pay | Admitting: Internal Medicine

## 2017-09-22 ENCOUNTER — Telehealth: Payer: Self-pay | Admitting: Internal Medicine

## 2017-09-22 MED ORDER — PREDNISONE 10 MG (21) PO TBPK: ORAL_TABLET | ORAL | 0 refills | Status: DC

## 2017-09-22 MED ORDER — AZITHROMYCIN 250 MG PO TABS: ORAL_TABLET | ORAL | 0 refills | Status: AC

## 2017-09-22 NOTE — Telephone Encounter (Signed)
Pt made aware abx and steroid sent to his pharmacy.

## 2017-09-22 NOTE — Telephone Encounter (Signed)
Sent script for azithromycin and prednisone taper to pharmacy

## 2017-09-22 NOTE — Telephone Encounter (Signed)
Per [facility] at home patient is having sx of o2 sat 83 % after deep breathing a blowing nose it came back up to 93 %   Patient also a low grade fever wheezing and white sputum   Patient is taking otc medications and is not feeling anybetter

## 2017-09-22 NOTE — Telephone Encounter (Signed)
Pt calling stating he has a cold  Would like us to please send something in  Please advise.

## 2017-11-11 DIAGNOSIS — R0602 Shortness of breath: Secondary | ICD-10-CM | POA: Diagnosis not present

## 2017-11-11 DIAGNOSIS — I509 Heart failure, unspecified: Secondary | ICD-10-CM | POA: Diagnosis not present

## 2017-11-11 DIAGNOSIS — J9610 Chronic respiratory failure, unspecified whether with hypoxia or hypercapnia: Secondary | ICD-10-CM | POA: Diagnosis not present

## 2017-11-26 DIAGNOSIS — J9610 Chronic respiratory failure, unspecified whether with hypoxia or hypercapnia: Secondary | ICD-10-CM | POA: Diagnosis not present

## 2017-11-26 DIAGNOSIS — R0602 Shortness of breath: Secondary | ICD-10-CM | POA: Diagnosis not present

## 2017-11-26 DIAGNOSIS — I509 Heart failure, unspecified: Secondary | ICD-10-CM | POA: Diagnosis not present

## 2017-11-26 DIAGNOSIS — J441 Chronic obstructive pulmonary disease with (acute) exacerbation: Secondary | ICD-10-CM | POA: Diagnosis not present

## 2017-12-03 NOTE — Progress Notes (Signed)
Cardiology Office Note  Date:  12/05/2017   ID:  [name], DOB [birth year], MRN [id number]  PCP:  Sherrie Mustache, MD   Chief Complaint  Patient presents with  . other    6 month follow up. Meds reviewed by the pt.'s med list. Pt. c/o shortness of breath & LE edema.     HPI:  67 y.o. male with h/o  Atrial fib, persistent Ejection fraction 50-55% by echo May 2018 morbid obesity,  DM II ARF obstructive sleep apnea,  hypertension,  chronic hypercarbic respiratory failure,   COPD with no prior smoking history,  chronic lower extremity edema  who presents for follow up of his shortness of breath, persistent atrial fibrillation  Last hospital admission 03/2017 No weight today in the office, he would not stand up out of his wheelchair, afraid he may fall, right leg weak  Home weight is "228" , new scale at home block from [facility] Previous weight was in the 300s (suspect weight is likely 328)  Seen by Dr. Belia Heman this morning,  Was recommend he decrease his albuterol secondary to feeling shaky  Not eating as much On oxygen 3 liters nasal canula Denies SOB on exertion but does not walk very far  Had a fall 3 weeks, hurt leg on the right Asking for pain medication  EKG personally reviewed by myself on todays visit Shows normal sinus rhythm rate 87 bpm rare APC nonspecific ST abnormality/early repolarization  Other past medical history reviewed admission 03/01/2017 for  pneumonia, COPD exacerbation. During that hospitalization he converted to atrial fibrillation   fan was blowing in his face, felt it caused sinus congestion, difficulty breathing, presented to the hospital 03/2017,  Back in atrial fib In the hospital,  worsening hypoxia,  low 80s  June 2018 weight was up to 336 pounds Worsening shortness of breath, leg swelling He was taking Lasix 40 mg daily Lasix up to 40 mg twice a day  In follow-up today Weight avg 306 at home, highest is 310 Leg swelling  improved, shortness of breath improving He does have some chronic leg edema on eliquis For atrial fibrillation  Other past medical history reviewed echocardiogram showing low normal ejection fraction 50-55%, moderate to severely elevated right heart pressures  CT scan chest from 2017 reviewed showing minimal coronary calcifications  PMH:   has a past medical history of Asthma, COPD (chronic obstructive pulmonary disease) (HCC), Heart attack (HCC), High blood pressure, High cholesterol, Scoliosis, Seasonal allergies, and Sleep apnea.  PSH:    Past Surgical History:  Procedure Laterality Date  . back sx     L4 L5   . CARPAL TUNNEL RELEASE Right     Current Outpatient Medications  Medication Sig Dispense Refill  . apixaban (ELIQUIS) 5 MG TABS tablet Take 1 tablet (5 mg total) by mouth 2 (two) times daily. 180 tablet 3  . docusate sodium (COLACE) 100 MG capsule Take 1 capsule (100 mg total) by mouth 2 (two) times daily as needed for mild constipation. 10 capsule 0  . ferrous sulfate 325 (65 FE) MG EC tablet Take 325 mg by mouth daily with breakfast.    . fluticasone (FLONASE) 50 MCG/ACT nasal spray Place 1 spray into both nostrils 2 (two) times daily. 16 g 2  . furosemide (LASIX) 40 MG tablet Take 1 tablet (40 mg total) by mouth 2 (two) times daily. 180 tablet 3  . glipiZIDE (GLUCOTROL) 5 MG tablet Take 5 mg by mouth 2 (two) times daily.    Marland Kitchen  ipratropium-albuterol (DUONEB) 0.5-2.5 (3) MG/3ML SOLN Take 3 mLs by nebulization 3 (three) times daily. Dx:J44.9 360 mL 6  . metoprolol tartrate (LOPRESSOR) 25 MG tablet Take 1 tablet (25 mg total) by mouth 2 (two) times daily. 60 tablet 1  . omeprazole (PRILOSEC) 20 MG capsule Take 20 mg by mouth daily.    . OXYGEN Inhale 2 L into the lungs continuous.    . polyethylene glycol powder (GLYCOLAX/MIRALAX) powder Dissolve one heaping tablespoon in 4-8 ounces of juice or water and drink once daily. (Patient taking differently: Take 17 g by mouth daily  as needed for mild constipation. ) 255 g 0  . pregabalin (LYRICA) 50 MG capsule Take 50 mg by mouth 2 (two) times daily.    . rosuvastatin (CRESTOR) 10 MG tablet Take 10 mg by mouth daily. Reported on 04/22/2016    . tamsulosin (FLOMAX) 0.4 MG CAPS capsule Take 0.4 mg by mouth daily.    . Vitamin D, Ergocalciferol, (DRISDOL) 50000 units CAPS capsule Take 50,000 Units by mouth every 7 (seven) days. Patient takes on Monday     No current facility-administered medications for this visit.      Allergies:   Metformin and related and Morphine and related   Social History:  The patient  reports that  has never smoked. he has never used smokeless tobacco. He reports that he does not drink alcohol or use drugs.   Family History:   family history includes Arthritis in his mother; Asthma in his father; Cancer in his father; Heart disease in his father and mother; Macular degeneration in his brother.    Review of Systems: Review of Systems  Constitutional: Negative.   Respiratory: Positive for shortness of breath.   Cardiovascular: Positive for leg swelling.  Gastrointestinal: Negative.   Musculoskeletal: Positive for falls.       Leg weakness  Neurological: Negative.   Psychiatric/Behavioral: Negative.   All other systems reviewed and are negative.    PHYSICAL EXAM: VS:  BP (!) 151/82 (BP Location: Right Wrist, Patient Position: Sitting, Cuff Size: Normal)   Pulse 87   Ht 5\' 7"  (1.702 m)   Wt 228 lb 5 oz (103.6 kg) Comment: weighed at home  BMI 35.76 kg/m  , BMI Body mass index is 35.76 kg/m. GEN: Well nourished, well developed, in no acute distress ,  obese, presenting a wheelchair on nasal cannula oxygen  HEENT: normal  Neck: no JVD, carotid bruits, or masses Cardiac: RRR; no murmurs, rubs, or gallops, trace to 1+ pitting edema To the mid shins Woody edema  to the thighs  Respiratory:  clear to auscultation bilaterally, normal work of breathing GI: soft, nontender, nondistended, +  BS MS: no deformity or atrophy  Skin: warm and dry, no rash Neuro:  Strength and sensation are intact Psych: euthymic mood, full affect    Recent Labs: 03/01/2017: ALT 14 03/05/2017: B Natriuretic Peptide 34.0; Magnesium 2.0 03/26/2017: Hemoglobin 12.2; Platelets 138 05/26/2017: BUN 18; Creatinine, Ser 1.13; Potassium 4.3; Sodium 140    Lipid Panel No results found for: CHOL, HDL, LDLCALC, TRIG    Wt Readings from Last 3 Encounters:  12/05/17 228 lb 5 oz (103.6 kg) (self-reported, likely inaccurate)  12/05/17 (!) 328 lb (148.8 kg)  05/26/17 (!) 306 lb 2 oz (138.9 kg)       ASSESSMENT AND PLAN:  Persistent atrial fibrillation (HCC) - Plan: EKG 12-Lead On anticoagulation, normal sinus rhythm on today's visit Stable  Chronic hypoxemic respiratory failure (HCC) - Plan:  EKG 12-Lead on 3 L nasal cannula oxygen Will continue Lasix 40 twice daily Unable to weigh  Acute renal failure with tubular necrosis (HCC) - Plan: EKG 12-Lead Followed by Dr. Thedore MinsSingh BMP today  Lower extremity edema - Plan: EKG 12-Lead Secondary to pulmonary hypertension, and obesity Continue Lasix up to 40 mg twice a day Stable  Morbid obesity (HCC) - Plan: EKG 12-Lead We have encouraged continued exercise, careful diet management in an effort to lose weight.  Unclear weight as he is unable to stand   chronic diastolic CHF Continue Lasix 40 twice daily   Total encounter time more than 25 minutes  Greater than 50% was spent in counseling and coordination of care with the patient  Disposition:   F/U  12 months   Orders Placed This Encounter  Procedures  . EKG 12-Lead     Signed, Dossie Arbourim Krishana Lutze, M.D., Ph.D. 12/05/2017  [facility] [place] [phone]

## 2017-12-05 ENCOUNTER — Encounter: Payer: Self-pay | Admitting: Cardiovascular Disease

## 2017-12-05 ENCOUNTER — Ambulatory Visit: Payer: Medicare HMO | Admitting: Internal Medicine

## 2017-12-05 ENCOUNTER — Telehealth: Payer: Self-pay | Admitting: Internal Medicine

## 2017-12-05 ENCOUNTER — Ambulatory Visit: Payer: Medicare HMO | Admitting: Cardiovascular Disease

## 2017-12-05 ENCOUNTER — Encounter: Payer: Self-pay | Admitting: Internal Medicine

## 2017-12-05 VITALS — BP 151/82 | HR 87 | Ht 67.0 in

## 2017-12-05 VITALS — BP 152/86 | HR 75 | Ht 66.0 in | Wt 328.0 lb

## 2017-12-05 DIAGNOSIS — E559 Vitamin D deficiency, unspecified: Secondary | ICD-10-CM | POA: Diagnosis not present

## 2017-12-05 DIAGNOSIS — I10 Essential (primary) hypertension: Secondary | ICD-10-CM | POA: Diagnosis not present

## 2017-12-05 DIAGNOSIS — R0602 Shortness of breath: Secondary | ICD-10-CM | POA: Diagnosis not present

## 2017-12-05 DIAGNOSIS — I5032 Chronic diastolic (congestive) heart failure: Secondary | ICD-10-CM

## 2017-12-05 DIAGNOSIS — I481 Persistent atrial fibrillation: Secondary | ICD-10-CM

## 2017-12-05 DIAGNOSIS — J9611 Chronic respiratory failure with hypoxia: Secondary | ICD-10-CM | POA: Diagnosis not present

## 2017-12-05 DIAGNOSIS — E669 Obesity, unspecified: Secondary | ICD-10-CM | POA: Diagnosis not present

## 2017-12-05 DIAGNOSIS — E785 Hyperlipidemia, unspecified: Secondary | ICD-10-CM | POA: Diagnosis not present

## 2017-12-05 DIAGNOSIS — N400 Enlarged prostate without lower urinary tract symptoms: Secondary | ICD-10-CM | POA: Diagnosis not present

## 2017-12-05 DIAGNOSIS — I4819 Other persistent atrial fibrillation: Secondary | ICD-10-CM

## 2017-12-05 DIAGNOSIS — J449 Chronic obstructive pulmonary disease, unspecified: Secondary | ICD-10-CM | POA: Diagnosis not present

## 2017-12-05 DIAGNOSIS — R600 Localized edema: Secondary | ICD-10-CM | POA: Diagnosis not present

## 2017-12-05 DIAGNOSIS — I509 Heart failure, unspecified: Secondary | ICD-10-CM | POA: Diagnosis not present

## 2017-12-05 DIAGNOSIS — E119 Type 2 diabetes mellitus without complications: Secondary | ICD-10-CM | POA: Diagnosis not present

## 2017-12-05 NOTE — Patient Instructions (Signed)
Continue oxygen as prescribed Decrease dounebs to daily Follow up cariology as prescribed

## 2017-12-05 NOTE — Telephone Encounter (Signed)
Pt returning our call  ° °

## 2017-12-05 NOTE — Telephone Encounter (Signed)
Pt advised per Dr. Belia HemanKasa he needs to get from PCP.

## 2017-12-05 NOTE — Telephone Encounter (Signed)
Pt is at [facility] asking if we may please fax over orders for his walker    Please fax it too:   [phone]  Please call patient if we can, for they are there waiting.

## 2017-12-05 NOTE — Progress Notes (Signed)
Name: [name] MRN: [id number] DOB: 1951/05/02     CONSULTATION DATE: 12/05/2017    Synopsis:: This is a morbidly obese 67 year old who came to the [facility] pulmonary clinic in January of 2014 for evaluation of shortness of breath. A prior history of asthma. He also has CHF as well as obstructive sleep apnea.  Full pulmonary function testing showed severe restriction consistent with obesity but no airflow obstruction.  A CT scan of his chest showed no evidence of parenchymal lung disease in 2014    12/12/2013 ROV > He continues to have shortness of breath on exertion.  He continues to use his CPAP machine at night as well as at times during the day while rest.  He lost the long tubing for his CPAP machine, but apparently he is still able to use it.  He really struggles to breathe get around the house. He does not have much problem with cough or wheezing. He does not get benefit from the Advair and Spiriva which he has been using continuously.  02/05/2014 ROV >> Jakhari had a hard time breathing today when his oxygen tank ran out. [facility] only has him on 1L continuously.  He has been using the oxygen at home only with exertion.  Before that he was doing well.  He has been swelling more lately so he started taking an extra dose of lasix a couple of days ago.  He has not taken the inhalers since the last visit and he actually felt a little better for a while.   CC follow up SOB HPI  +CHF +resp failure Now on 4 L [place] Patient feels he is back to baseline shortness of breath Patient has been on oxygen for many years  has no evidence of infection at this time Patient has no acute issues at this time  followed by cardiology for his CHF On lasix Would like to decrease dounebs to see if tremors go away    PAST MEDICAL HISTORY :   has a past medical history of Asthma, COPD (chronic obstructive pulmonary disease) (HCC), Heart attack (HCC), High blood pressure, High  cholesterol, Scoliosis, Seasonal allergies, and Sleep apnea.  has a past surgical history that includes back sx and Carpal tunnel release (Right).                                                                         REVIEW OF SYSTEMS:   Constitutional: Negative for fever, chills, weight loss, malaise/fatigue and diaphoresis.  HENT: Negative for hearing loss, ear pain, nosebleeds, congestion, sore throat, neck pain, tinnitus and ear discharge.   Eyes: Negative for blurred vision, double vision, photophobia, pain, discharge and redness.  Respiratory: +shortness of breath, -wheezing and -stridor.   Cardiovascular: Negative for chest pain, palpitations, orthopnea, claudication, +leg swelling and PND.  Gastrointestinal: Negative for heartburn, nausea, vomiting, abdominal pain, diarrhea, constipation, blood in stool and melena.   ALL OTHER ROS ARE NEGATIVE BP (!) 152/86 (BP Location: Left Arm, Cuff Size: Large)   Pulse 75   Ht 5\' 6"  (1.676 m)   Wt (!) 328 lb (148.8 kg) Comment: per pt  SpO2 90%   BMI 52.94 kg/m    Physical Examination:   GENERAL:NAD,  no fevers, chills, no weakness no fatigue EAR, NOSE, THROAT: Clear without exudates. No external lesions.  NECK: Supple. No thyromegaly. No nodules. No JVD.  PULMONARY:CTA B/L no wheezes,+crackles, no rhonchi CARDIOVASCULAR: S1 and S2. Regular rate and rhythm. No murmurs, rubs, or gallops. + edema.  GASTROINTESTINAL: Soft, nontender, nondistended. No masses. Positive bowel sounds.  MUSCULOSKELETAL: No swelling, clubbing, or edema. Range of motion full in all extremities.  NEUROLOGIC: Cranial nerves II through XII are intact. No gross focal neurological deficits.       ASSESSMENT / PLAN: 67 yo male with morbidly obese AAM with PMH of restrictive lung disease from morbid obesity with Chronic hypoxic Resp. Failure,  hx of previous MI, HTN, Hyperlipidemia, noncompliant OSA, in the setting of morbid obesity and deconditioned state,  Was recently discharged from the hospital due to shortness of breath and noted to be in CHF exacerbation.   1. Acute on Chronic Resp. Failure with Hypoxia and Hypercapnia - due to CHF, morbid obesity, deconditioned state  -patient will need oxygen 24/7 keep sats >90% Re-assess DME company for equipement   2. CHF- -He is already on chronic oxygen at home. -Follow up cardiology consult recommendations -lasix as tolerated  3. Obstructive sleep apnea-patient refuses to obtain sleep study for further assessment  -he DOES NOT  want to be treated for sleep apnea  4. Diabetes type II without complication- Patient will resume his Victoza and metformin   5.Obesity -recommend significant weight loss -recommend changing diet  6.Deconditioned state -Recommend increased daily activity and exercise    Patient/Family are satisfied with Plan of action and management. All questions answered Follow up in 6 months  Overall prognosis is poor and guarded patient with end-stage respiratory failure  Wallis BambergKurian Santiago Gladavid Brandley Aldrete, M.D.  [facility]  Medical Director [facility] Medical Director [facility]

## 2017-12-05 NOTE — Patient Instructions (Signed)

## 2017-12-12 DIAGNOSIS — J9610 Chronic respiratory failure, unspecified whether with hypoxia or hypercapnia: Secondary | ICD-10-CM | POA: Diagnosis not present

## 2017-12-12 DIAGNOSIS — I509 Heart failure, unspecified: Secondary | ICD-10-CM | POA: Diagnosis not present

## 2017-12-12 DIAGNOSIS — R0602 Shortness of breath: Secondary | ICD-10-CM | POA: Diagnosis not present

## 2017-12-21 DIAGNOSIS — R69 Illness, unspecified: Secondary | ICD-10-CM | POA: Diagnosis not present

## 2017-12-27 DIAGNOSIS — R0602 Shortness of breath: Secondary | ICD-10-CM | POA: Diagnosis not present

## 2017-12-27 DIAGNOSIS — J441 Chronic obstructive pulmonary disease with (acute) exacerbation: Secondary | ICD-10-CM | POA: Diagnosis not present

## 2017-12-27 DIAGNOSIS — I509 Heart failure, unspecified: Secondary | ICD-10-CM | POA: Diagnosis not present

## 2017-12-27 DIAGNOSIS — J9610 Chronic respiratory failure, unspecified whether with hypoxia or hypercapnia: Secondary | ICD-10-CM | POA: Diagnosis not present

## 2017-12-28 DIAGNOSIS — R69 Illness, unspecified: Secondary | ICD-10-CM | POA: Diagnosis not present

## 2017-12-29 DIAGNOSIS — R69 Illness, unspecified: Secondary | ICD-10-CM | POA: Diagnosis not present

## 2018-01-09 DIAGNOSIS — J9610 Chronic respiratory failure, unspecified whether with hypoxia or hypercapnia: Secondary | ICD-10-CM | POA: Diagnosis not present

## 2018-01-09 DIAGNOSIS — I509 Heart failure, unspecified: Secondary | ICD-10-CM | POA: Diagnosis not present

## 2018-01-09 DIAGNOSIS — R0602 Shortness of breath: Secondary | ICD-10-CM | POA: Diagnosis not present

## 2018-01-24 DIAGNOSIS — J9610 Chronic respiratory failure, unspecified whether with hypoxia or hypercapnia: Secondary | ICD-10-CM | POA: Diagnosis not present

## 2018-01-24 DIAGNOSIS — J441 Chronic obstructive pulmonary disease with (acute) exacerbation: Secondary | ICD-10-CM | POA: Diagnosis not present

## 2018-01-24 DIAGNOSIS — R0602 Shortness of breath: Secondary | ICD-10-CM | POA: Diagnosis not present

## 2018-01-24 DIAGNOSIS — I509 Heart failure, unspecified: Secondary | ICD-10-CM | POA: Diagnosis not present

## 2018-02-03 DIAGNOSIS — I4891 Unspecified atrial fibrillation: Secondary | ICD-10-CM | POA: Diagnosis not present

## 2018-02-03 DIAGNOSIS — E1151 Type 2 diabetes mellitus with diabetic peripheral angiopathy without gangrene: Secondary | ICD-10-CM | POA: Diagnosis not present

## 2018-02-03 DIAGNOSIS — E1142 Type 2 diabetes mellitus with diabetic polyneuropathy: Secondary | ICD-10-CM | POA: Diagnosis not present

## 2018-02-03 DIAGNOSIS — D649 Anemia, unspecified: Secondary | ICD-10-CM | POA: Diagnosis not present

## 2018-02-03 DIAGNOSIS — H04129 Dry eye syndrome of unspecified lacrimal gland: Secondary | ICD-10-CM | POA: Diagnosis not present

## 2018-02-03 DIAGNOSIS — I110 Hypertensive heart disease with heart failure: Secondary | ICD-10-CM | POA: Diagnosis not present

## 2018-02-03 DIAGNOSIS — I509 Heart failure, unspecified: Secondary | ICD-10-CM | POA: Diagnosis not present

## 2018-02-03 DIAGNOSIS — E785 Hyperlipidemia, unspecified: Secondary | ICD-10-CM | POA: Diagnosis not present

## 2018-02-03 DIAGNOSIS — J9610 Chronic respiratory failure, unspecified whether with hypoxia or hypercapnia: Secondary | ICD-10-CM | POA: Diagnosis not present

## 2018-02-03 DIAGNOSIS — J449 Chronic obstructive pulmonary disease, unspecified: Secondary | ICD-10-CM | POA: Diagnosis not present

## 2018-02-08 ENCOUNTER — Other Ambulatory Visit: Payer: Self-pay

## 2018-02-08 MED ORDER — METOPROLOL TARTRATE 25 MG PO TABS: 25.0000 mg | ORAL_TABLET | Freq: Two times a day (BID) | ORAL | 3 refills | Status: AC

## 2018-02-08 NOTE — Telephone Encounter (Signed)
Request refill for Metoprolol Tart 25 mg take one tablet twice a day.

## 2018-02-09 DIAGNOSIS — J9610 Chronic respiratory failure, unspecified whether with hypoxia or hypercapnia: Secondary | ICD-10-CM | POA: Diagnosis not present

## 2018-02-09 DIAGNOSIS — R0602 Shortness of breath: Secondary | ICD-10-CM | POA: Diagnosis not present

## 2018-02-09 DIAGNOSIS — I509 Heart failure, unspecified: Secondary | ICD-10-CM | POA: Diagnosis not present

## 2018-02-14 IMAGING — US US EXTREM LOW VENOUS BILAT
1 series · 13 of 24 positions shown · non-contrast
Comparison: None.

CLINICAL DATA: Bilateral swelling for 1 day



[Series 1: us extrem low venous bilat · 0.14mm/px · 13 of 78 slices shown]
[im 1/78]
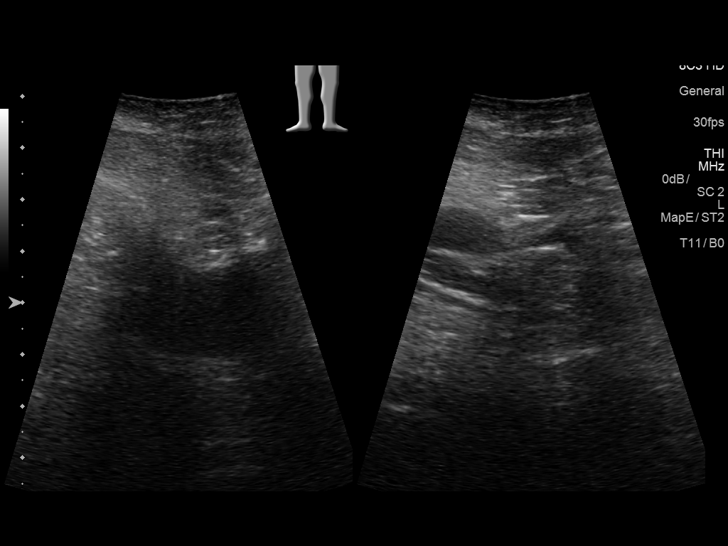
[im 7/78]
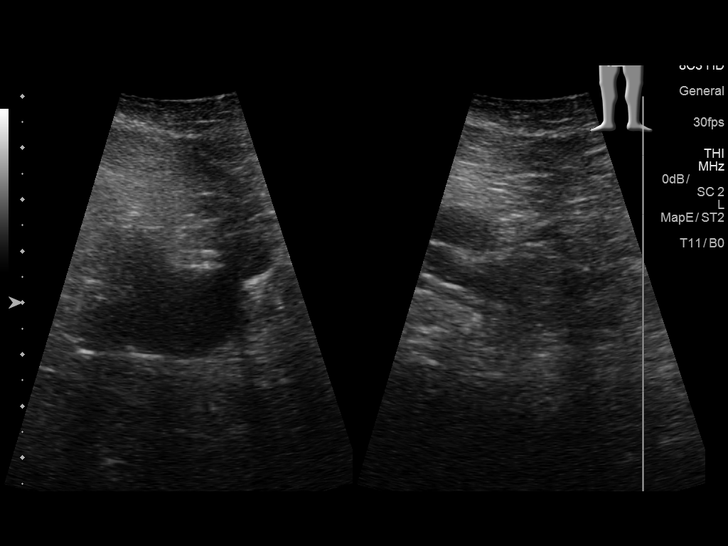
[im 14/78]
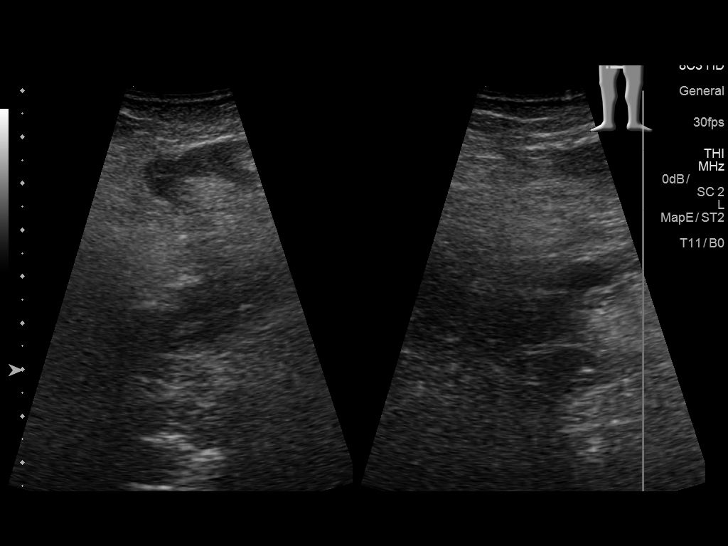
[im 21/78]
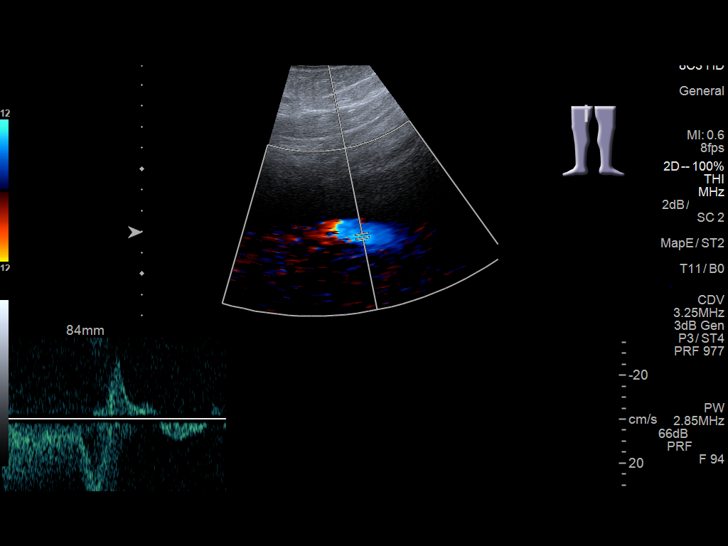
[im 27/78]
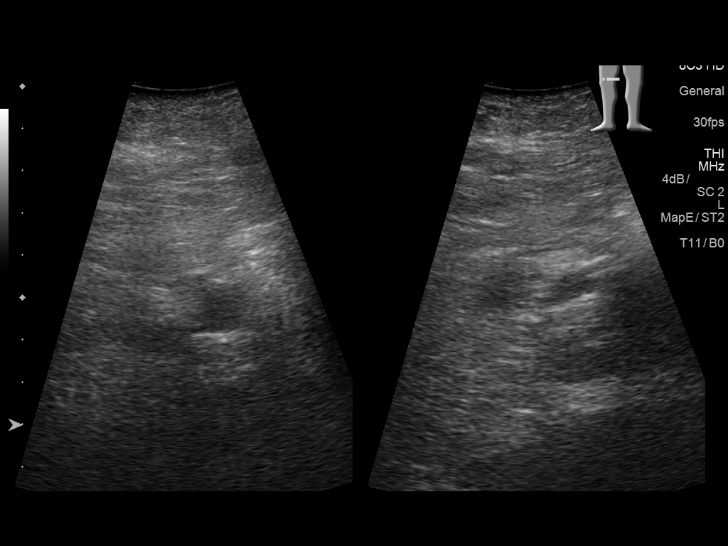
[im 34/78]
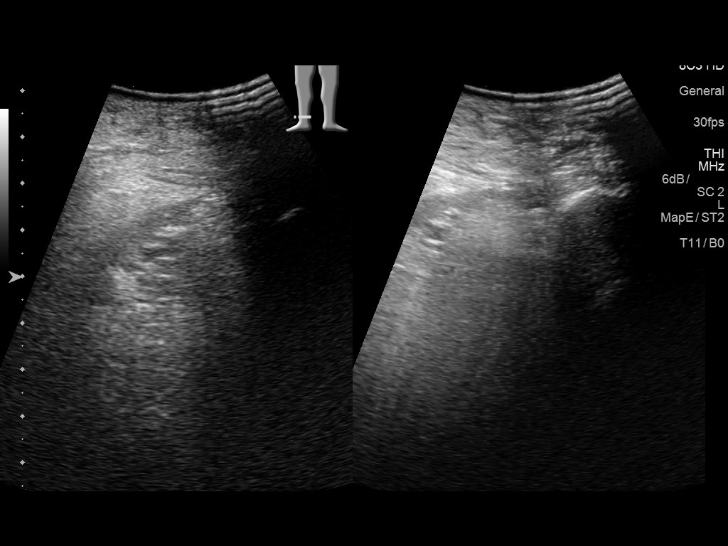
[im 41/78]
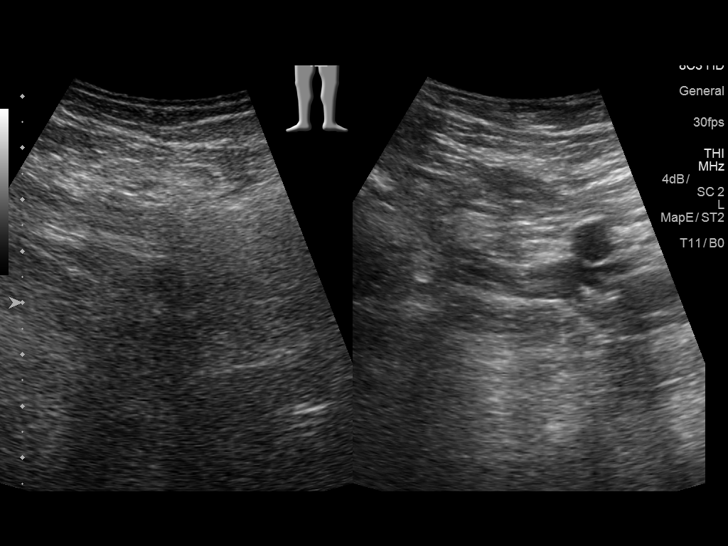
[im 44/78]
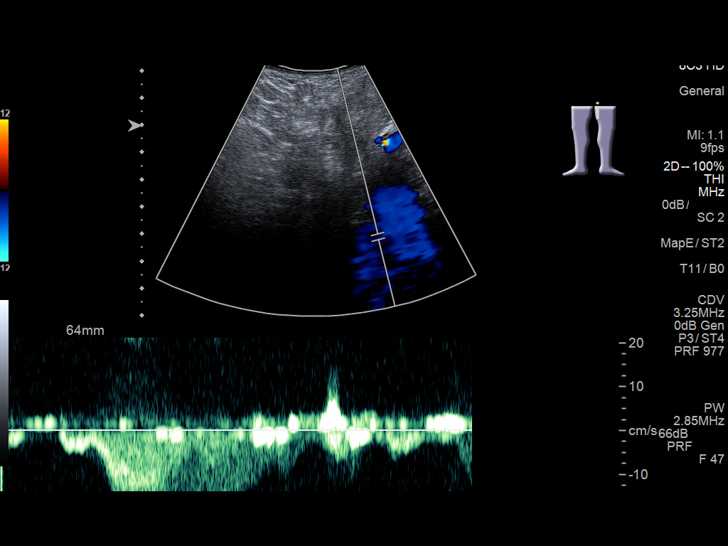
[im 51/78]
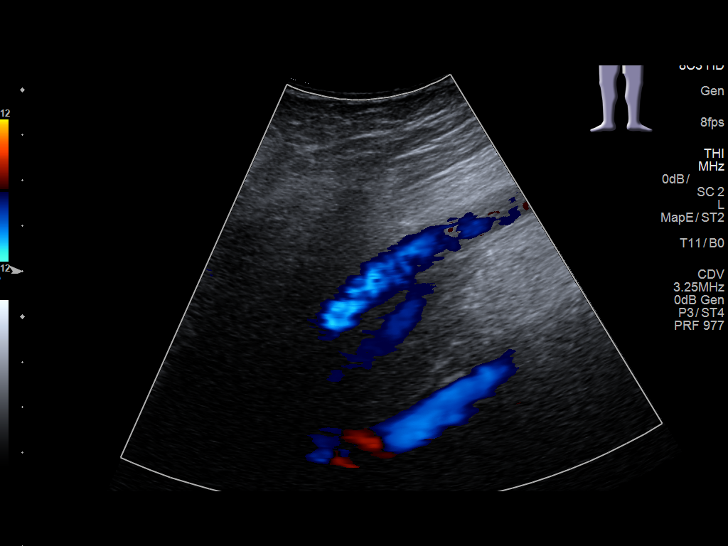
[im 57/78]
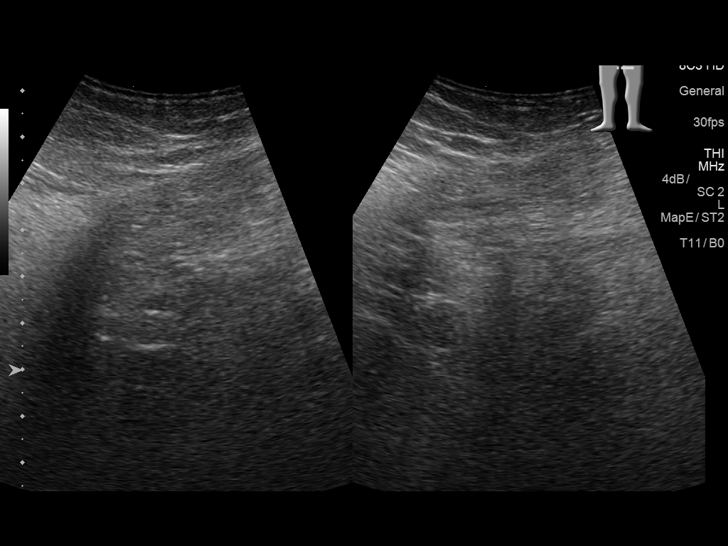
[im 64/78]
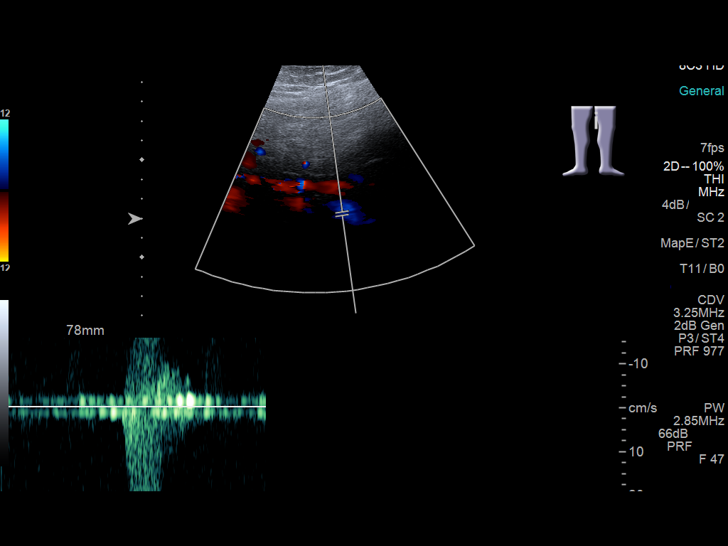
[im 71/78]
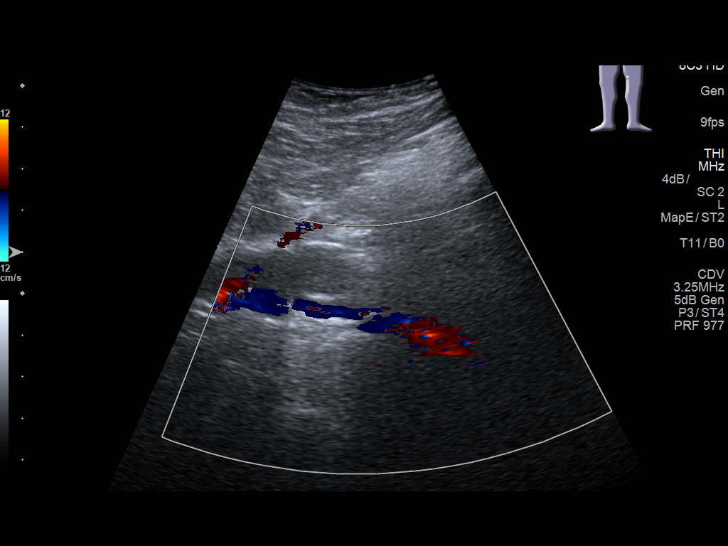
[im 78/78]
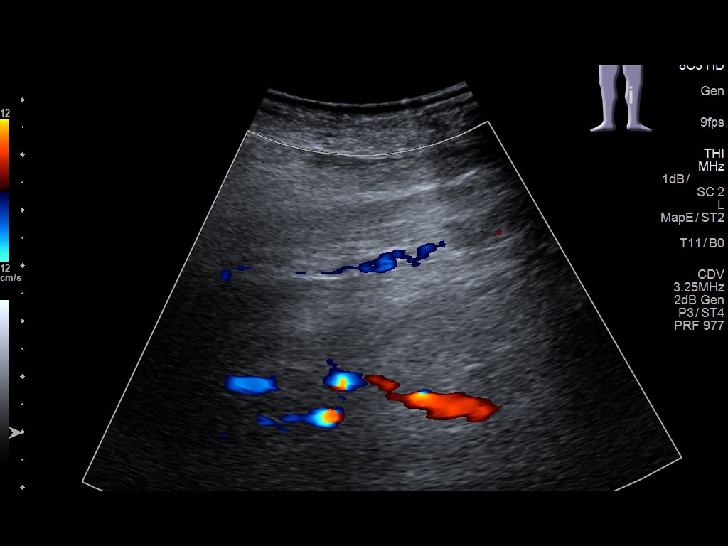

[13 of 24 positions shown; findings below may reference images not displayed]

FINDINGS: RIGHT LOWER EXTREMITY

Common Femoral Vein: No evidence of thrombus. Normal
compressibility, respiratory phasicity and response to augmentation.

Saphenofemoral Junction: No evidence of thrombus. Normal
compressibility and flow on color Doppler imaging.

Profunda Femoral Vein: No evidence of thrombus. Normal
compressibility and flow on color Doppler imaging.

Femoral Vein: No evidence of thrombus. Normal compressibility,
respiratory phasicity and response to augmentation.

Popliteal Vein: No evidence of thrombus. Normal compressibility,
respiratory phasicity and response to augmentation.

Calf Veins: No evidence of thrombus. Normal compressibility and flow
on color Doppler imaging.

Superficial Great Saphenous Vein: No evidence of thrombus. Normal
compressibility and flow on color Doppler imaging.

Venous Reflux:  None.

Other Findings:  None.

LEFT LOWER EXTREMITY

Common Femoral Vein: No evidence of thrombus. Normal
compressibility, respiratory phasicity and response to augmentation.

Saphenofemoral Junction: No evidence of thrombus. Normal
compressibility and flow on color Doppler imaging.

Profunda Femoral Vein: No evidence of thrombus. Normal
compressibility and flow on color Doppler imaging.

Femoral Vein: No evidence of thrombus. Normal compressibility,
respiratory phasicity and response to augmentation.

Popliteal Vein: No evidence of thrombus. Normal compressibility,
respiratory phasicity and response to augmentation.

Calf Veins: No evidence of thrombus. Normal compressibility and flow
on color Doppler imaging.

Superficial Great Saphenous Vein: No evidence of thrombus. Normal
compressibility and flow on color Doppler imaging.

Venous Reflux:  None.

Other Findings:  None.
IMPRESSION: No definite evidence evidence of deep venous thrombosis bilateral
lower extremity. Suboptimal study due to patient's large body
habitus and lower extremity edema.

## 2018-02-17 DIAGNOSIS — R69 Illness, unspecified: Secondary | ICD-10-CM | POA: Diagnosis not present

## 2018-02-24 DIAGNOSIS — J441 Chronic obstructive pulmonary disease with (acute) exacerbation: Secondary | ICD-10-CM | POA: Diagnosis not present

## 2018-02-24 DIAGNOSIS — J9610 Chronic respiratory failure, unspecified whether with hypoxia or hypercapnia: Secondary | ICD-10-CM | POA: Diagnosis not present

## 2018-02-24 DIAGNOSIS — R0602 Shortness of breath: Secondary | ICD-10-CM | POA: Diagnosis not present

## 2018-02-24 DIAGNOSIS — I509 Heart failure, unspecified: Secondary | ICD-10-CM | POA: Diagnosis not present

## 2018-03-11 DIAGNOSIS — I509 Heart failure, unspecified: Secondary | ICD-10-CM | POA: Diagnosis not present

## 2018-03-11 DIAGNOSIS — R0602 Shortness of breath: Secondary | ICD-10-CM | POA: Diagnosis not present

## 2018-03-11 DIAGNOSIS — J9610 Chronic respiratory failure, unspecified whether with hypoxia or hypercapnia: Secondary | ICD-10-CM | POA: Diagnosis not present

## 2018-03-26 DIAGNOSIS — J9610 Chronic respiratory failure, unspecified whether with hypoxia or hypercapnia: Secondary | ICD-10-CM | POA: Diagnosis not present

## 2018-03-26 DIAGNOSIS — I509 Heart failure, unspecified: Secondary | ICD-10-CM | POA: Diagnosis not present

## 2018-03-26 DIAGNOSIS — R0602 Shortness of breath: Secondary | ICD-10-CM | POA: Diagnosis not present

## 2018-03-26 DIAGNOSIS — J441 Chronic obstructive pulmonary disease with (acute) exacerbation: Secondary | ICD-10-CM | POA: Diagnosis not present

## 2018-03-27 DIAGNOSIS — R21 Rash and other nonspecific skin eruption: Secondary | ICD-10-CM | POA: Diagnosis not present

## 2018-03-27 DIAGNOSIS — J449 Chronic obstructive pulmonary disease, unspecified: Secondary | ICD-10-CM | POA: Diagnosis not present

## 2018-03-27 DIAGNOSIS — R5383 Other fatigue: Secondary | ICD-10-CM | POA: Diagnosis not present

## 2018-03-27 DIAGNOSIS — E1165 Type 2 diabetes mellitus with hyperglycemia: Secondary | ICD-10-CM | POA: Diagnosis not present

## 2018-03-27 DIAGNOSIS — E785 Hyperlipidemia, unspecified: Secondary | ICD-10-CM | POA: Diagnosis not present

## 2018-03-27 DIAGNOSIS — E559 Vitamin D deficiency, unspecified: Secondary | ICD-10-CM | POA: Diagnosis not present

## 2018-03-27 DIAGNOSIS — E669 Obesity, unspecified: Secondary | ICD-10-CM | POA: Diagnosis not present

## 2018-03-27 DIAGNOSIS — R69 Illness, unspecified: Secondary | ICD-10-CM | POA: Diagnosis not present

## 2018-03-31 DIAGNOSIS — I110 Hypertensive heart disease with heart failure: Secondary | ICD-10-CM | POA: Diagnosis not present

## 2018-03-31 DIAGNOSIS — I509 Heart failure, unspecified: Secondary | ICD-10-CM | POA: Diagnosis not present

## 2018-03-31 DIAGNOSIS — J449 Chronic obstructive pulmonary disease, unspecified: Secondary | ICD-10-CM | POA: Diagnosis not present

## 2018-03-31 DIAGNOSIS — M159 Polyosteoarthritis, unspecified: Secondary | ICD-10-CM | POA: Diagnosis not present

## 2018-03-31 DIAGNOSIS — E785 Hyperlipidemia, unspecified: Secondary | ICD-10-CM | POA: Diagnosis not present

## 2018-03-31 DIAGNOSIS — N400 Enlarged prostate without lower urinary tract symptoms: Secondary | ICD-10-CM | POA: Diagnosis not present

## 2018-03-31 DIAGNOSIS — E559 Vitamin D deficiency, unspecified: Secondary | ICD-10-CM | POA: Diagnosis not present

## 2018-03-31 DIAGNOSIS — E119 Type 2 diabetes mellitus without complications: Secondary | ICD-10-CM | POA: Diagnosis not present

## 2018-03-31 DIAGNOSIS — R238 Other skin changes: Secondary | ICD-10-CM | POA: Diagnosis not present

## 2018-04-07 DIAGNOSIS — R69 Illness, unspecified: Secondary | ICD-10-CM | POA: Diagnosis not present

## 2018-04-10 ENCOUNTER — Other Ambulatory Visit: Payer: Self-pay

## 2018-04-10 DIAGNOSIS — Z7901 Long term (current) use of anticoagulants: Secondary | ICD-10-CM

## 2018-04-10 MED ORDER — APIXABAN 5 MG PO TABS: 5.0000 mg | ORAL_TABLET | Freq: Two times a day (BID) | ORAL | 3 refills | Status: AC

## 2018-04-11 DIAGNOSIS — I509 Heart failure, unspecified: Secondary | ICD-10-CM | POA: Diagnosis not present

## 2018-04-11 DIAGNOSIS — J9610 Chronic respiratory failure, unspecified whether with hypoxia or hypercapnia: Secondary | ICD-10-CM | POA: Diagnosis not present

## 2018-04-11 DIAGNOSIS — R0602 Shortness of breath: Secondary | ICD-10-CM | POA: Diagnosis not present

## 2018-04-12 DIAGNOSIS — N400 Enlarged prostate without lower urinary tract symptoms: Secondary | ICD-10-CM | POA: Diagnosis not present

## 2018-04-12 DIAGNOSIS — R238 Other skin changes: Secondary | ICD-10-CM | POA: Diagnosis not present

## 2018-04-12 DIAGNOSIS — M159 Polyosteoarthritis, unspecified: Secondary | ICD-10-CM | POA: Diagnosis not present

## 2018-04-12 DIAGNOSIS — E119 Type 2 diabetes mellitus without complications: Secondary | ICD-10-CM | POA: Diagnosis not present

## 2018-04-12 DIAGNOSIS — E785 Hyperlipidemia, unspecified: Secondary | ICD-10-CM | POA: Diagnosis not present

## 2018-04-12 DIAGNOSIS — I110 Hypertensive heart disease with heart failure: Secondary | ICD-10-CM | POA: Diagnosis not present

## 2018-04-12 DIAGNOSIS — I509 Heart failure, unspecified: Secondary | ICD-10-CM | POA: Diagnosis not present

## 2018-04-12 DIAGNOSIS — J449 Chronic obstructive pulmonary disease, unspecified: Secondary | ICD-10-CM | POA: Diagnosis not present

## 2018-04-12 DIAGNOSIS — E559 Vitamin D deficiency, unspecified: Secondary | ICD-10-CM | POA: Diagnosis not present

## 2018-04-14 DIAGNOSIS — I110 Hypertensive heart disease with heart failure: Secondary | ICD-10-CM | POA: Diagnosis not present

## 2018-04-14 DIAGNOSIS — N400 Enlarged prostate without lower urinary tract symptoms: Secondary | ICD-10-CM | POA: Diagnosis not present

## 2018-04-14 DIAGNOSIS — R238 Other skin changes: Secondary | ICD-10-CM | POA: Diagnosis not present

## 2018-04-14 DIAGNOSIS — I509 Heart failure, unspecified: Secondary | ICD-10-CM | POA: Diagnosis not present

## 2018-04-14 DIAGNOSIS — E785 Hyperlipidemia, unspecified: Secondary | ICD-10-CM | POA: Diagnosis not present

## 2018-04-14 DIAGNOSIS — J449 Chronic obstructive pulmonary disease, unspecified: Secondary | ICD-10-CM | POA: Diagnosis not present

## 2018-04-14 DIAGNOSIS — E119 Type 2 diabetes mellitus without complications: Secondary | ICD-10-CM | POA: Diagnosis not present

## 2018-04-14 DIAGNOSIS — E559 Vitamin D deficiency, unspecified: Secondary | ICD-10-CM | POA: Diagnosis not present

## 2018-04-14 DIAGNOSIS — M159 Polyosteoarthritis, unspecified: Secondary | ICD-10-CM | POA: Diagnosis not present

## 2018-04-17 DIAGNOSIS — J449 Chronic obstructive pulmonary disease, unspecified: Secondary | ICD-10-CM | POA: Diagnosis not present

## 2018-04-17 DIAGNOSIS — E785 Hyperlipidemia, unspecified: Secondary | ICD-10-CM | POA: Diagnosis not present

## 2018-04-17 DIAGNOSIS — R238 Other skin changes: Secondary | ICD-10-CM | POA: Diagnosis not present

## 2018-04-17 DIAGNOSIS — I509 Heart failure, unspecified: Secondary | ICD-10-CM | POA: Diagnosis not present

## 2018-04-17 DIAGNOSIS — N400 Enlarged prostate without lower urinary tract symptoms: Secondary | ICD-10-CM | POA: Diagnosis not present

## 2018-04-17 DIAGNOSIS — I110 Hypertensive heart disease with heart failure: Secondary | ICD-10-CM | POA: Diagnosis not present

## 2018-04-17 DIAGNOSIS — E559 Vitamin D deficiency, unspecified: Secondary | ICD-10-CM | POA: Diagnosis not present

## 2018-04-17 DIAGNOSIS — E119 Type 2 diabetes mellitus without complications: Secondary | ICD-10-CM | POA: Diagnosis not present

## 2018-04-17 DIAGNOSIS — M159 Polyosteoarthritis, unspecified: Secondary | ICD-10-CM | POA: Diagnosis not present

## 2018-04-20 DIAGNOSIS — R238 Other skin changes: Secondary | ICD-10-CM | POA: Diagnosis not present

## 2018-04-20 DIAGNOSIS — I110 Hypertensive heart disease with heart failure: Secondary | ICD-10-CM | POA: Diagnosis not present

## 2018-04-20 DIAGNOSIS — E119 Type 2 diabetes mellitus without complications: Secondary | ICD-10-CM | POA: Diagnosis not present

## 2018-04-20 DIAGNOSIS — N400 Enlarged prostate without lower urinary tract symptoms: Secondary | ICD-10-CM | POA: Diagnosis not present

## 2018-04-20 DIAGNOSIS — E785 Hyperlipidemia, unspecified: Secondary | ICD-10-CM | POA: Diagnosis not present

## 2018-04-20 DIAGNOSIS — J449 Chronic obstructive pulmonary disease, unspecified: Secondary | ICD-10-CM | POA: Diagnosis not present

## 2018-04-20 DIAGNOSIS — I509 Heart failure, unspecified: Secondary | ICD-10-CM | POA: Diagnosis not present

## 2018-04-20 DIAGNOSIS — M159 Polyosteoarthritis, unspecified: Secondary | ICD-10-CM | POA: Diagnosis not present

## 2018-04-20 DIAGNOSIS — E559 Vitamin D deficiency, unspecified: Secondary | ICD-10-CM | POA: Diagnosis not present

## 2018-04-21 DIAGNOSIS — I110 Hypertensive heart disease with heart failure: Secondary | ICD-10-CM | POA: Diagnosis not present

## 2018-04-21 DIAGNOSIS — R238 Other skin changes: Secondary | ICD-10-CM | POA: Diagnosis not present

## 2018-04-21 DIAGNOSIS — E559 Vitamin D deficiency, unspecified: Secondary | ICD-10-CM | POA: Diagnosis not present

## 2018-04-21 DIAGNOSIS — J449 Chronic obstructive pulmonary disease, unspecified: Secondary | ICD-10-CM | POA: Diagnosis not present

## 2018-04-21 DIAGNOSIS — E119 Type 2 diabetes mellitus without complications: Secondary | ICD-10-CM | POA: Diagnosis not present

## 2018-04-21 DIAGNOSIS — I509 Heart failure, unspecified: Secondary | ICD-10-CM | POA: Diagnosis not present

## 2018-04-21 DIAGNOSIS — N400 Enlarged prostate without lower urinary tract symptoms: Secondary | ICD-10-CM | POA: Diagnosis not present

## 2018-04-21 DIAGNOSIS — M159 Polyosteoarthritis, unspecified: Secondary | ICD-10-CM | POA: Diagnosis not present

## 2018-04-21 DIAGNOSIS — E785 Hyperlipidemia, unspecified: Secondary | ICD-10-CM | POA: Diagnosis not present

## 2018-04-23 DIAGNOSIS — D649 Anemia, unspecified: Secondary | ICD-10-CM | POA: Insufficient documentation

## 2018-04-23 NOTE — Progress Notes (Deleted)
[facility]  Telephone:(336) [phone] Fax:(336) [phone]  ID: [name] OB: 01/03/1951  MR#: [id number]  AOZ#:[id number]#:[id number]  Patient Care Team: Sherrie MustacheJadali, Fayegh, MD as PCP - General (Internal Medicine)  CHIEF COMPLAINT: Anemia, unspecified.  INTERVAL HISTORY: ***  REVIEW OF SYSTEMS:   ROS  As per HPI. Otherwise, a complete review of systems is negative.  PAST MEDICAL HISTORY: Past Medical History:  Diagnosis Date  . Asthma   . COPD (chronic obstructive pulmonary disease) (HCC)   . Heart attack (HCC)   . High blood pressure   . High cholesterol   . Scoliosis   . Seasonal allergies   . Sleep apnea     PAST SURGICAL HISTORY: Past Surgical History:  Procedure Laterality Date  . back sx     L4 L5   . CARPAL TUNNEL RELEASE Right     FAMILY HISTORY: Family History  Problem Relation Age of Onset  . Heart disease Father   . Asthma Father   . Cancer Father   . Heart disease Mother   . Arthritis Mother   . Macular degeneration Brother     ADVANCED DIRECTIVES (Y/N):  N  HEALTH MAINTENANCE: Social History   Tobacco Use  . Smoking status: Never Smoker  . Smokeless tobacco: Never Used  Substance Use Topics  . Alcohol use: No  . Drug use: No     Colonoscopy:  PAP:  Bone density:  Lipid panel:  Allergies  Allergen Reactions  . Metformin And Related Other (See Comments)    "shakes" per pt  . Morphine And Related Other (See Comments)    Hallucinations, sweats    Current Outpatient Medications  Medication Sig Dispense Refill  . apixaban (ELIQUIS) 5 MG TABS tablet Take 1 tablet (5 mg total) by mouth 2 (two) times daily. 180 tablet 3  . docusate sodium (COLACE) 100 MG capsule Take 1 capsule (100 mg total) by mouth 2 (two) times daily as needed for mild constipation. 10 capsule 0  . ferrous sulfate 325 (65 FE) MG EC tablet Take 325 mg by mouth daily with breakfast.    . fluticasone (FLONASE) 50 MCG/ACT nasal spray Place 1 spray into both  nostrils 2 (two) times daily. 16 g 2  . furosemide (LASIX) 40 MG tablet Take 1 tablet (40 mg total) by mouth 2 (two) times daily. 180 tablet 3  . glipiZIDE (GLUCOTROL) 5 MG tablet Take 5 mg by mouth 2 (two) times daily.    Marland Kitchen. ipratropium-albuterol (DUONEB) 0.5-2.5 (3) MG/3ML SOLN Take 3 mLs by nebulization 3 (three) times daily. Dx:J44.9 360 mL 6  . metoprolol tartrate (LOPRESSOR) 25 MG tablet Take 1 tablet (25 mg total) by mouth 2 (two) times daily. 180 tablet 3  . omeprazole (PRILOSEC) 20 MG capsule Take 20 mg by mouth daily.    . OXYGEN Inhale 2 L into the lungs continuous.    . polyethylene glycol powder (GLYCOLAX/MIRALAX) powder Dissolve one heaping tablespoon in 4-8 ounces of juice or water and drink once daily. (Patient taking differently: Take 17 g by mouth daily as needed for mild constipation. ) 255 g 0  . pregabalin (LYRICA) 50 MG capsule Take 50 mg by mouth 2 (two) times daily.    . rosuvastatin (CRESTOR) 10 MG tablet Take 10 mg by mouth daily. Reported on 04/22/2016    . tamsulosin (FLOMAX) 0.4 MG CAPS capsule Take 0.4 mg by mouth daily.    . Vitamin D, Ergocalciferol, (DRISDOL) 50000 units CAPS capsule Take 50,000  Units by mouth every 7 (seven) days. Patient takes on Monday     No current facility-administered medications for this visit.     OBJECTIVE: There were no vitals filed for this visit.   There is no height or weight on file to calculate BMI.    ECOG FS:{CHL ONC [id number]  General: Well-developed, well-nourished, no acute distress. Eyes: Pink conjunctiva, anicteric sclera. HEENT: Normocephalic, moist mucous membranes, clear oropharnyx. Lungs: Clear to auscultation bilaterally. Heart: Regular rate and rhythm. No rubs, murmurs, or gallops. Abdomen: Soft, nontender, nondistended. No organomegaly noted, normoactive bowel sounds. Musculoskeletal: No edema, cyanosis, or clubbing. Neuro: Alert, answering all questions appropriately. Cranial nerves grossly intact. Skin: No  rashes or petechiae noted. Psych: Normal affect. Lymphatics: No cervical, calvicular, axillary or inguinal LAD.   LAB RESULTS:  Lab Results  Component Value Date   NA 140 05/26/2017   K 4.3 05/26/2017   CL 90 (L) 05/26/2017   CO2 27 05/26/2017   GLUCOSE 179 (H) 05/26/2017   BUN 18 05/26/2017   CREATININE 1.13 05/26/2017   CALCIUM 9.3 05/26/2017   PROT 8.3 (H) 03/01/2017   ALBUMIN 3.4 (L) 03/01/2017   AST 28 03/01/2017   ALT 14 (L) 03/01/2017   ALKPHOS 86 03/01/2017   BILITOT 1.1 03/01/2017   GFRNONAA 68 05/26/2017   GFRAA 78 05/26/2017    Lab Results  Component Value Date   WBC 7.9 03/26/2017   NEUTROABS 4.8 03/18/2017   HGB 12.2 (L) 03/26/2017   HCT 38.1 (L) 03/26/2017   MCV 88.8 03/26/2017   PLT 138 (L) 03/26/2017     STUDIES: No results found.  ASSESSMENT: Anemia, unspecified.  PLAN:    1. Anemia, unspecified:  Patient expressed understanding and was in agreement with this plan. He also understands that He can call clinic at any time with any questions, concerns, or complaints.   Cancer Staging No matching staging information was found for the patient.  Jeralyn Ruths, MD   04/23/2018 10:45 PM

## 2018-04-24 ENCOUNTER — Inpatient Hospital Stay: Payer: Medicare HMO | Admitting: Oncology

## 2018-04-24 DIAGNOSIS — I110 Hypertensive heart disease with heart failure: Secondary | ICD-10-CM | POA: Diagnosis not present

## 2018-04-24 DIAGNOSIS — E785 Hyperlipidemia, unspecified: Secondary | ICD-10-CM | POA: Diagnosis not present

## 2018-04-24 DIAGNOSIS — E119 Type 2 diabetes mellitus without complications: Secondary | ICD-10-CM | POA: Diagnosis not present

## 2018-04-24 DIAGNOSIS — J449 Chronic obstructive pulmonary disease, unspecified: Secondary | ICD-10-CM | POA: Diagnosis not present

## 2018-04-24 DIAGNOSIS — R238 Other skin changes: Secondary | ICD-10-CM | POA: Diagnosis not present

## 2018-04-24 DIAGNOSIS — I509 Heart failure, unspecified: Secondary | ICD-10-CM | POA: Diagnosis not present

## 2018-04-24 DIAGNOSIS — N400 Enlarged prostate without lower urinary tract symptoms: Secondary | ICD-10-CM | POA: Diagnosis not present

## 2018-04-24 DIAGNOSIS — E559 Vitamin D deficiency, unspecified: Secondary | ICD-10-CM | POA: Diagnosis not present

## 2018-04-24 DIAGNOSIS — M159 Polyosteoarthritis, unspecified: Secondary | ICD-10-CM | POA: Diagnosis not present

## 2018-04-26 DIAGNOSIS — J449 Chronic obstructive pulmonary disease, unspecified: Secondary | ICD-10-CM | POA: Diagnosis not present

## 2018-04-26 DIAGNOSIS — E119 Type 2 diabetes mellitus without complications: Secondary | ICD-10-CM | POA: Diagnosis not present

## 2018-04-26 DIAGNOSIS — R0602 Shortness of breath: Secondary | ICD-10-CM | POA: Diagnosis not present

## 2018-04-26 DIAGNOSIS — R238 Other skin changes: Secondary | ICD-10-CM | POA: Diagnosis not present

## 2018-04-26 DIAGNOSIS — I509 Heart failure, unspecified: Secondary | ICD-10-CM | POA: Diagnosis not present

## 2018-04-26 DIAGNOSIS — J9610 Chronic respiratory failure, unspecified whether with hypoxia or hypercapnia: Secondary | ICD-10-CM | POA: Diagnosis not present

## 2018-04-26 DIAGNOSIS — E785 Hyperlipidemia, unspecified: Secondary | ICD-10-CM | POA: Diagnosis not present

## 2018-04-26 DIAGNOSIS — M159 Polyosteoarthritis, unspecified: Secondary | ICD-10-CM | POA: Diagnosis not present

## 2018-04-26 DIAGNOSIS — E559 Vitamin D deficiency, unspecified: Secondary | ICD-10-CM | POA: Diagnosis not present

## 2018-04-26 DIAGNOSIS — I110 Hypertensive heart disease with heart failure: Secondary | ICD-10-CM | POA: Diagnosis not present

## 2018-04-26 DIAGNOSIS — J441 Chronic obstructive pulmonary disease with (acute) exacerbation: Secondary | ICD-10-CM | POA: Diagnosis not present

## 2018-04-26 DIAGNOSIS — N400 Enlarged prostate without lower urinary tract symptoms: Secondary | ICD-10-CM | POA: Diagnosis not present

## 2018-04-27 DIAGNOSIS — N400 Enlarged prostate without lower urinary tract symptoms: Secondary | ICD-10-CM | POA: Diagnosis not present

## 2018-04-27 DIAGNOSIS — I509 Heart failure, unspecified: Secondary | ICD-10-CM | POA: Diagnosis not present

## 2018-04-27 DIAGNOSIS — J449 Chronic obstructive pulmonary disease, unspecified: Secondary | ICD-10-CM | POA: Diagnosis not present

## 2018-04-27 DIAGNOSIS — I110 Hypertensive heart disease with heart failure: Secondary | ICD-10-CM | POA: Diagnosis not present

## 2018-04-27 DIAGNOSIS — E785 Hyperlipidemia, unspecified: Secondary | ICD-10-CM | POA: Diagnosis not present

## 2018-04-27 DIAGNOSIS — M159 Polyosteoarthritis, unspecified: Secondary | ICD-10-CM | POA: Diagnosis not present

## 2018-04-27 DIAGNOSIS — E119 Type 2 diabetes mellitus without complications: Secondary | ICD-10-CM | POA: Diagnosis not present

## 2018-04-27 DIAGNOSIS — R238 Other skin changes: Secondary | ICD-10-CM | POA: Diagnosis not present

## 2018-04-27 DIAGNOSIS — E559 Vitamin D deficiency, unspecified: Secondary | ICD-10-CM | POA: Diagnosis not present

## 2018-04-28 ENCOUNTER — Other Ambulatory Visit: Payer: Self-pay | Admitting: *Deleted

## 2018-04-28 DIAGNOSIS — I110 Hypertensive heart disease with heart failure: Secondary | ICD-10-CM | POA: Diagnosis not present

## 2018-04-28 DIAGNOSIS — N400 Enlarged prostate without lower urinary tract symptoms: Secondary | ICD-10-CM | POA: Diagnosis not present

## 2018-04-28 DIAGNOSIS — E785 Hyperlipidemia, unspecified: Secondary | ICD-10-CM | POA: Diagnosis not present

## 2018-04-28 DIAGNOSIS — E559 Vitamin D deficiency, unspecified: Secondary | ICD-10-CM | POA: Diagnosis not present

## 2018-04-28 DIAGNOSIS — J449 Chronic obstructive pulmonary disease, unspecified: Secondary | ICD-10-CM | POA: Diagnosis not present

## 2018-04-28 DIAGNOSIS — R238 Other skin changes: Secondary | ICD-10-CM | POA: Diagnosis not present

## 2018-04-28 DIAGNOSIS — E119 Type 2 diabetes mellitus without complications: Secondary | ICD-10-CM | POA: Diagnosis not present

## 2018-04-28 DIAGNOSIS — I509 Heart failure, unspecified: Secondary | ICD-10-CM | POA: Diagnosis not present

## 2018-04-28 DIAGNOSIS — M159 Polyosteoarthritis, unspecified: Secondary | ICD-10-CM | POA: Diagnosis not present

## 2018-04-28 MED ORDER — FUROSEMIDE 40 MG PO TABS: 40.0000 mg | ORAL_TABLET | Freq: Two times a day (BID) | ORAL | 2 refills | Status: AC

## 2018-05-01 DIAGNOSIS — E559 Vitamin D deficiency, unspecified: Secondary | ICD-10-CM | POA: Diagnosis not present

## 2018-05-01 DIAGNOSIS — J449 Chronic obstructive pulmonary disease, unspecified: Secondary | ICD-10-CM | POA: Diagnosis not present

## 2018-05-01 DIAGNOSIS — E785 Hyperlipidemia, unspecified: Secondary | ICD-10-CM | POA: Diagnosis not present

## 2018-05-01 DIAGNOSIS — I509 Heart failure, unspecified: Secondary | ICD-10-CM | POA: Diagnosis not present

## 2018-05-01 DIAGNOSIS — E119 Type 2 diabetes mellitus without complications: Secondary | ICD-10-CM | POA: Diagnosis not present

## 2018-05-01 DIAGNOSIS — M159 Polyosteoarthritis, unspecified: Secondary | ICD-10-CM | POA: Diagnosis not present

## 2018-05-01 DIAGNOSIS — R238 Other skin changes: Secondary | ICD-10-CM | POA: Diagnosis not present

## 2018-05-01 DIAGNOSIS — I110 Hypertensive heart disease with heart failure: Secondary | ICD-10-CM | POA: Diagnosis not present

## 2018-05-01 DIAGNOSIS — N400 Enlarged prostate without lower urinary tract symptoms: Secondary | ICD-10-CM | POA: Diagnosis not present

## 2018-05-09 DIAGNOSIS — M159 Polyosteoarthritis, unspecified: Secondary | ICD-10-CM | POA: Diagnosis not present

## 2018-05-09 DIAGNOSIS — I110 Hypertensive heart disease with heart failure: Secondary | ICD-10-CM | POA: Diagnosis not present

## 2018-05-09 DIAGNOSIS — N400 Enlarged prostate without lower urinary tract symptoms: Secondary | ICD-10-CM | POA: Diagnosis not present

## 2018-05-09 DIAGNOSIS — I509 Heart failure, unspecified: Secondary | ICD-10-CM | POA: Diagnosis not present

## 2018-05-09 DIAGNOSIS — J449 Chronic obstructive pulmonary disease, unspecified: Secondary | ICD-10-CM | POA: Diagnosis not present

## 2018-05-09 DIAGNOSIS — E119 Type 2 diabetes mellitus without complications: Secondary | ICD-10-CM | POA: Diagnosis not present

## 2018-05-09 DIAGNOSIS — E785 Hyperlipidemia, unspecified: Secondary | ICD-10-CM | POA: Diagnosis not present

## 2018-05-09 DIAGNOSIS — E559 Vitamin D deficiency, unspecified: Secondary | ICD-10-CM | POA: Diagnosis not present

## 2018-05-09 DIAGNOSIS — R238 Other skin changes: Secondary | ICD-10-CM | POA: Diagnosis not present

## 2018-05-11 DIAGNOSIS — J9610 Chronic respiratory failure, unspecified whether with hypoxia or hypercapnia: Secondary | ICD-10-CM | POA: Diagnosis not present

## 2018-05-11 DIAGNOSIS — R0602 Shortness of breath: Secondary | ICD-10-CM | POA: Diagnosis not present

## 2018-05-11 DIAGNOSIS — I509 Heart failure, unspecified: Secondary | ICD-10-CM | POA: Diagnosis not present

## 2018-05-15 DIAGNOSIS — I469 Cardiac arrest, cause unspecified: Secondary | ICD-10-CM | POA: Diagnosis not present

## 2018-05-15 DIAGNOSIS — I509 Heart failure, unspecified: Secondary | ICD-10-CM | POA: Diagnosis not present

## 2018-05-15 DIAGNOSIS — Z9181 History of falling: Secondary | ICD-10-CM | POA: Diagnosis not present

## 2018-06-08 DIAGNOSIS — 419620001 Death: Secondary | SNOMED CT | POA: Diagnosis not present

## 2018-06-08 DEATH — deceased

## 2019-02-08 IMAGING — US US EXTREM LOW VENOUS*R*
1 series · 13 of 24 positions shown · non-contrast
Comparison: None.

CLINICAL DATA: Right lower extremity edema

EXAM:
RIGHT LOWER EXTREMITY VENOUS DUPLEX ULTRASOUND
TECHNIQUE: Gray-scale sonography with graded compression, as well as color
Doppler and duplex ultrasound were performed to evaluate the right
lower extremity deep venous system from the level of the common
femoral vein and including the common femoral, femoral, profunda
femoral, popliteal and calf veins including the posterior tibial,
peroneal and gastrocnemius veins when visible. The superficial great
saphenous vein was also interrogated. Spectral Doppler was utilized
to evaluate flow at rest and with distal augmentation maneuvers in
the common femoral, femoral and popliteal veins.

[Series 1: us extrem low venous*right* · 0.10mm/px · 13 of 33 slices shown]
[im 1/33]
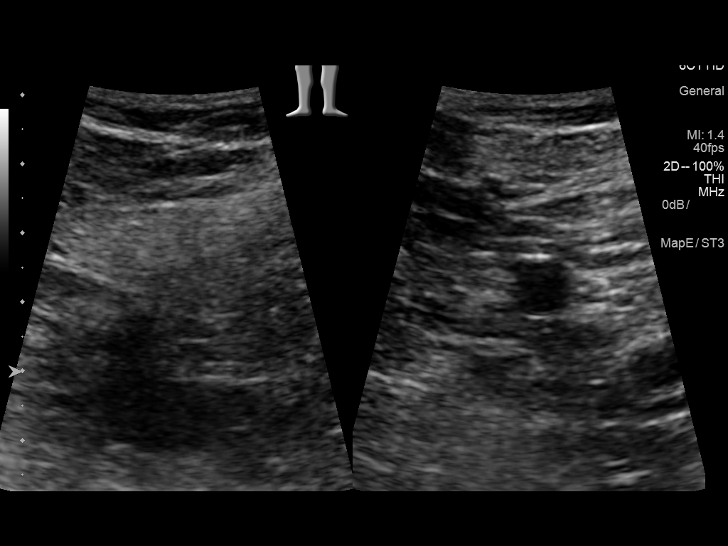
[im 3/33]
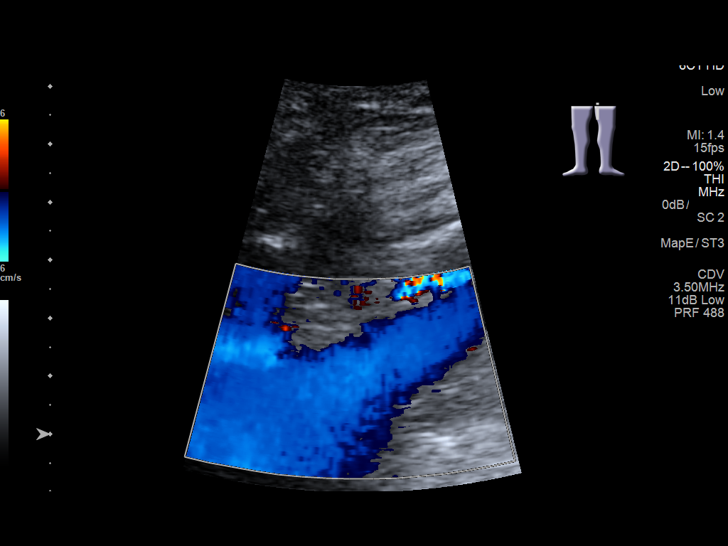
[im 6/33]
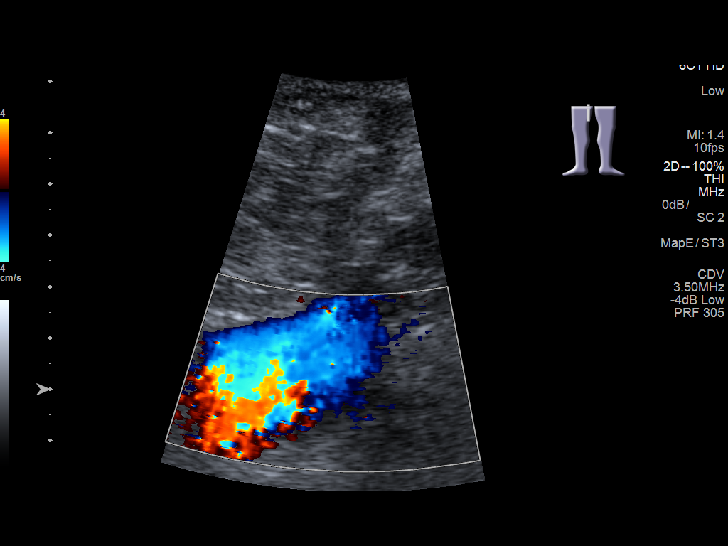
[im 9/33]
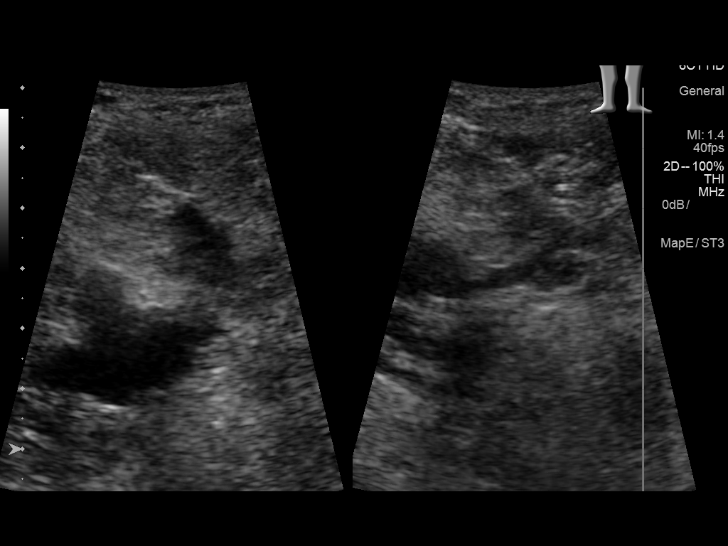
[im 12/33]
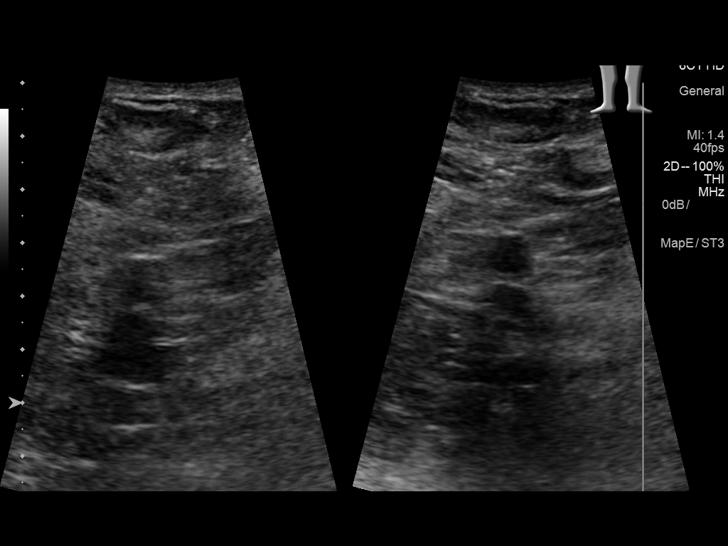
[im 14/33]
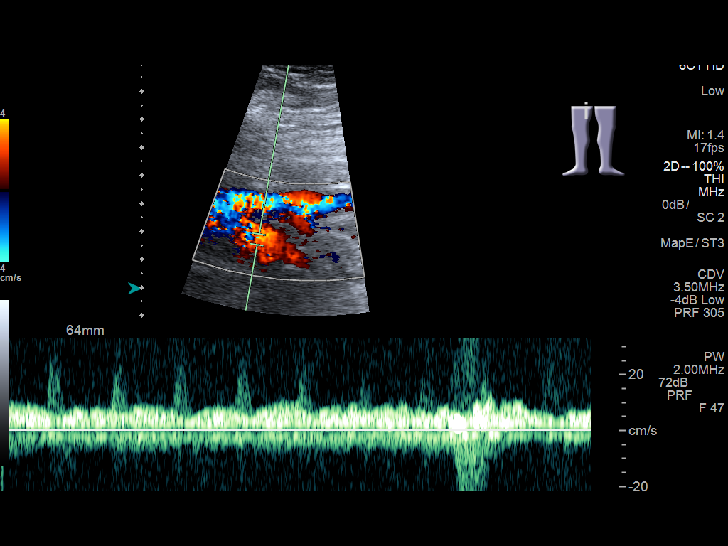
[im 17/33]
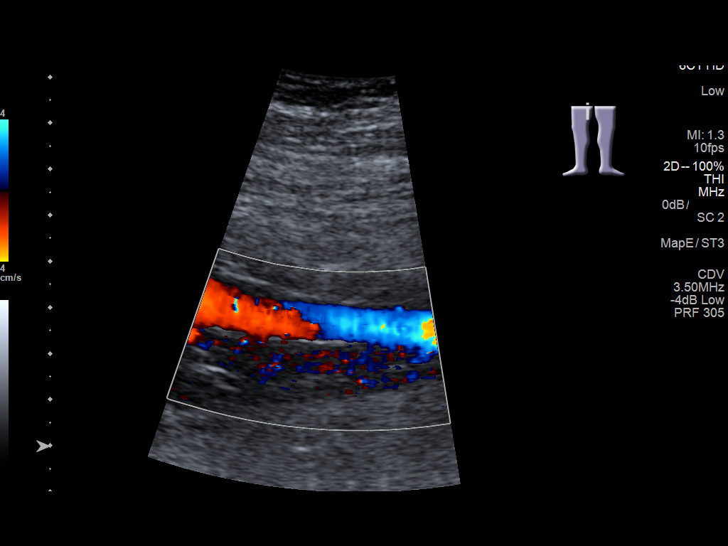
[im 19/33]
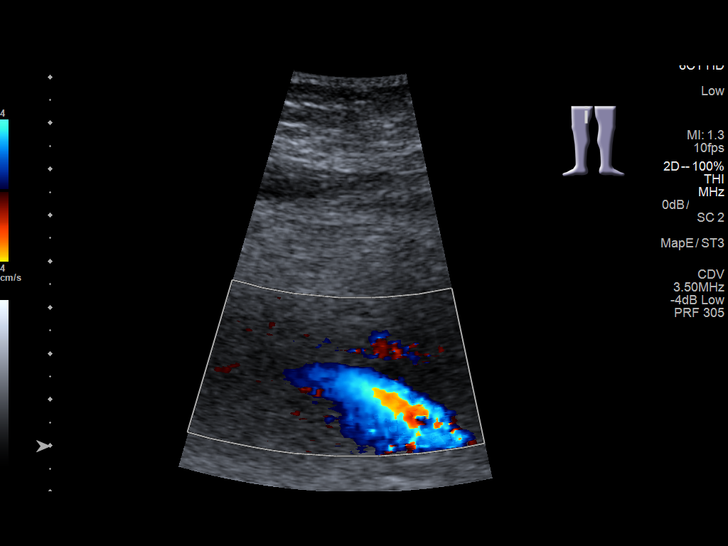
[im 21/33]
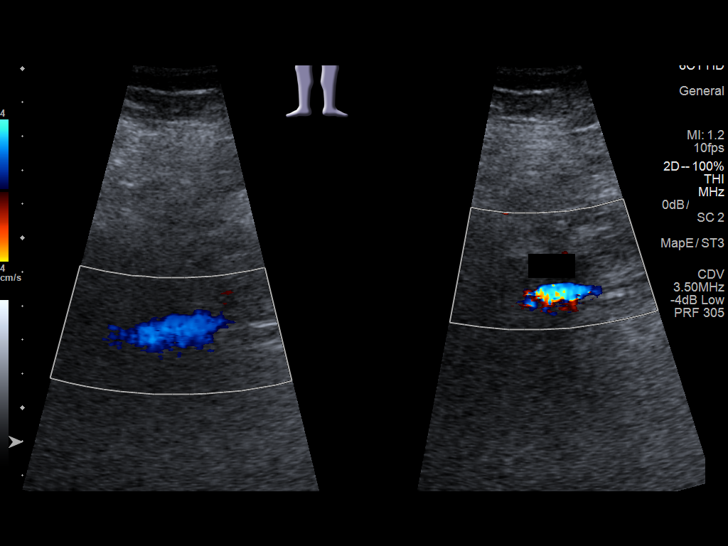
[im 24/33]
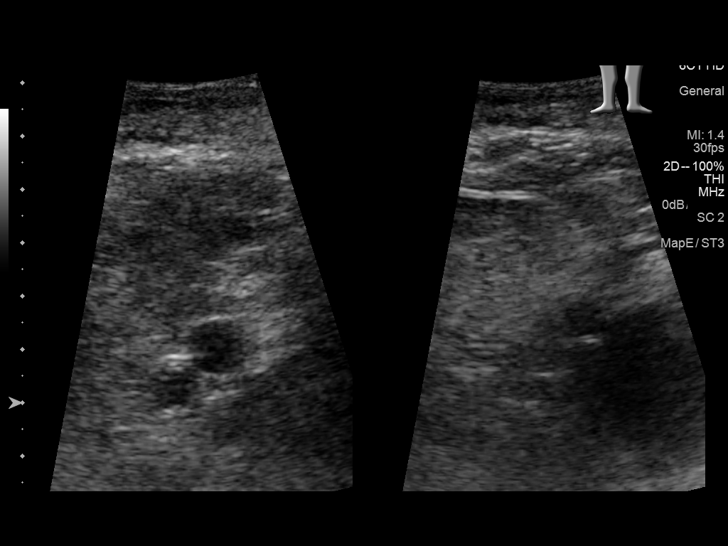
[im 27/33]
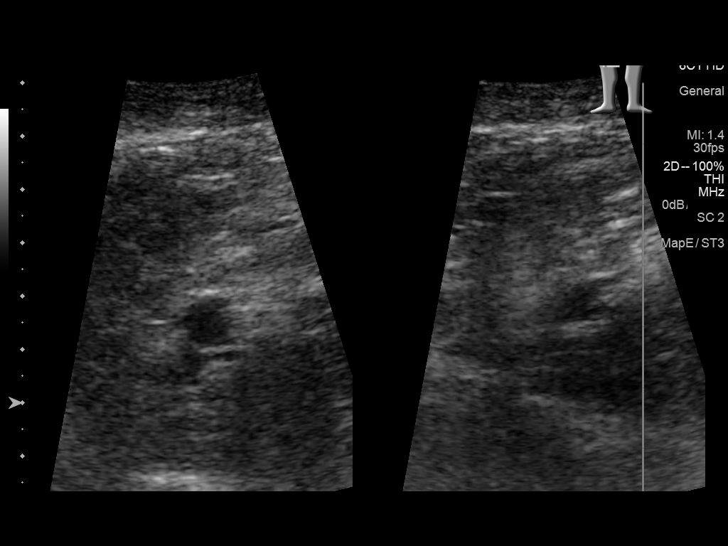
[im 30/33]
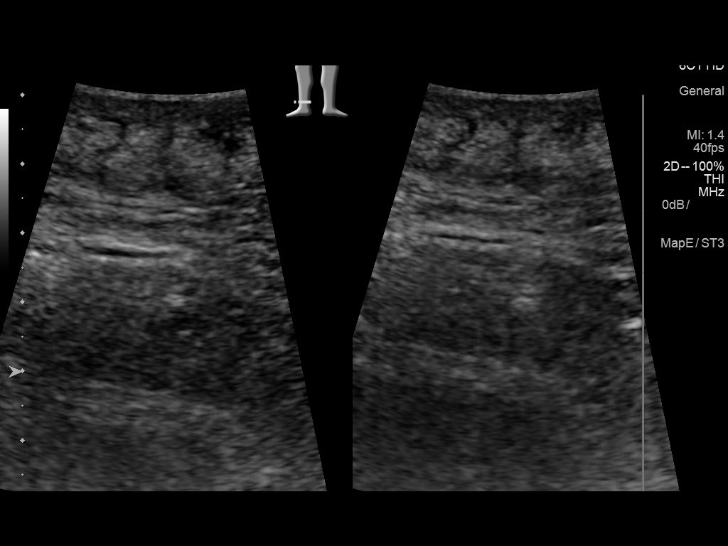
[im 33/33]
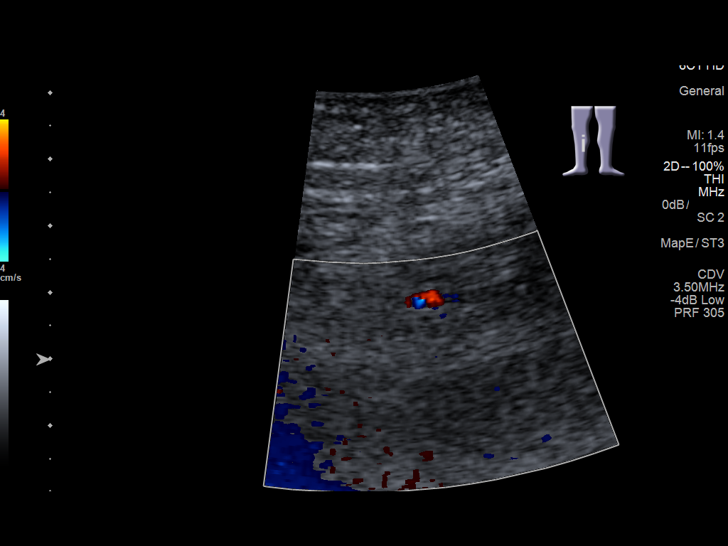

[13 of 24 positions shown; findings below may reference images not displayed]

FINDINGS: Contralateral Common Femoral Vein: Respiratory phasicity is normal
and symmetric with the symptomatic side. No evidence of thrombus.
Normal compressibility.

Common Femoral Vein: No evidence of thrombus. Normal
compressibility, respiratory phasicity and response to augmentation.

Saphenofemoral Junction: No evidence of thrombus. Normal
compressibility and flow on color Doppler imaging.

Profunda Femoral Vein: No evidence of thrombus. Normal
compressibility and flow on color Doppler imaging.

Femoral Vein: No evidence of thrombus. Normal compressibility,
respiratory phasicity and response to augmentation.

Popliteal Vein: No evidence of thrombus. Normal compressibility,
respiratory phasicity and response to augmentation.

Calf Veins: No evidence of thrombus. Normal compressibility and flow
on color Doppler imaging.

Superficial Great Saphenous Vein: No evidence of thrombus. Normal
compressibility and flow on color Doppler imaging.

Venous Reflux:  None.

Other Findings:  None.
IMPRESSION: No evidence of right lower extremity deep venous thrombosis. Left
common femoral vein also patent.

## 2020-10-23 ENCOUNTER — Telehealth: Payer: Self-pay | Admitting: Cardiovascular Disease

## 2020-10-23 NOTE — Telephone Encounter (Signed)
3 attempts to schedule fu appt from recall list.   Deleting recall.
